# Patient Record
Sex: Female | Born: 1950
Health system: Southern US, Community
[De-identification: ages and names within clinical notes are randomized; demographics above are authoritative.]

## PROBLEM LIST (undated history)

## (undated) DIAGNOSIS — F32A Depression, unspecified: Secondary | ICD-10-CM

## (undated) DIAGNOSIS — F209 Schizophrenia, unspecified: Secondary | ICD-10-CM

## (undated) DIAGNOSIS — E785 Hyperlipidemia, unspecified: Secondary | ICD-10-CM

## (undated) DIAGNOSIS — K219 Gastro-esophageal reflux disease without esophagitis: Secondary | ICD-10-CM

## (undated) DIAGNOSIS — A539 Syphilis, unspecified: Secondary | ICD-10-CM

## (undated) DIAGNOSIS — F329 Major depressive disorder, single episode, unspecified: Secondary | ICD-10-CM

## (undated) DIAGNOSIS — I2510 Atherosclerotic heart disease of native coronary artery without angina pectoris: Secondary | ICD-10-CM

## (undated) DIAGNOSIS — I10 Essential (primary) hypertension: Secondary | ICD-10-CM

## (undated) DIAGNOSIS — E119 Type 2 diabetes mellitus without complications: Secondary | ICD-10-CM

## (undated) DIAGNOSIS — F0390 Unspecified dementia without behavioral disturbance: Secondary | ICD-10-CM

## (undated) HISTORY — PX: OTHER SURGICAL HISTORY: SHX169

## (undated) HISTORY — PX: TUBAL LIGATION: SHX77

## (undated) HISTORY — PX: COLONOSCOPY: SHX174

---

## 2005-08-20 ENCOUNTER — Emergency Department: Payer: Self-pay | Admitting: Emergency Medicine

## 2005-08-20 ENCOUNTER — Other Ambulatory Visit: Payer: Self-pay

## 2005-12-20 ENCOUNTER — Ambulatory Visit: Payer: Self-pay | Admitting: Family Medicine

## 2006-12-16 ENCOUNTER — Ambulatory Visit: Payer: Self-pay | Admitting: Gastroenterology

## 2007-03-24 ENCOUNTER — Ambulatory Visit: Payer: Self-pay | Admitting: Family Medicine

## 2007-03-25 ENCOUNTER — Ambulatory Visit: Payer: Self-pay | Admitting: Family Medicine

## 2007-04-12 ENCOUNTER — Ambulatory Visit: Payer: Self-pay | Admitting: Family Medicine

## 2007-05-12 ENCOUNTER — Ambulatory Visit: Payer: Self-pay | Admitting: Family Medicine

## 2007-06-12 ENCOUNTER — Ambulatory Visit: Payer: Self-pay | Admitting: Family Medicine

## 2007-12-30 ENCOUNTER — Ambulatory Visit: Payer: Self-pay | Admitting: Internal Medicine

## 2008-06-14 ENCOUNTER — Ambulatory Visit: Payer: Self-pay | Admitting: Family Medicine

## 2009-08-04 ENCOUNTER — Ambulatory Visit: Payer: Self-pay | Admitting: Family Medicine

## 2010-03-20 ENCOUNTER — Ambulatory Visit: Payer: Self-pay | Admitting: Gastroenterology

## 2011-02-19 ENCOUNTER — Ambulatory Visit: Payer: Self-pay | Admitting: Family Medicine

## 2013-11-17 DIAGNOSIS — F32A Depression, unspecified: Secondary | ICD-10-CM

## 2013-11-17 DIAGNOSIS — F329 Major depressive disorder, single episode, unspecified: Secondary | ICD-10-CM

## 2015-07-20 ENCOUNTER — Encounter: Payer: Self-pay | Admitting: *Deleted

## 2015-07-21 ENCOUNTER — Ambulatory Visit: Admission: RE | Admit: 2015-07-21 | Payer: Medicare Other | Source: Ambulatory Visit | Admitting: Gastroenterology

## 2015-07-21 ENCOUNTER — Encounter: Admission: RE | Payer: Self-pay | Source: Ambulatory Visit

## 2015-07-21 HISTORY — DX: Atherosclerotic heart disease of native coronary artery without angina pectoris: I25.10

## 2015-07-21 HISTORY — DX: Major depressive disorder, single episode, unspecified: F32.9

## 2015-07-21 HISTORY — DX: Syphilis, unspecified: A53.9

## 2015-07-21 HISTORY — DX: Gastro-esophageal reflux disease without esophagitis: K21.9

## 2015-07-21 HISTORY — DX: Depression, unspecified: F32.A

## 2015-07-21 HISTORY — DX: Essential (primary) hypertension: I10

## 2015-07-21 SURGERY — COLONOSCOPY WITH PROPOFOL
Anesthesia: General

## 2015-08-31 ENCOUNTER — Encounter: Payer: Self-pay | Admitting: *Deleted

## 2015-09-01 ENCOUNTER — Ambulatory Visit: Payer: Medicare Other | Admitting: Anesthesiology

## 2015-09-01 ENCOUNTER — Encounter: Payer: Self-pay | Admitting: *Deleted

## 2015-09-01 ENCOUNTER — Encounter
Admission: RE | Disposition: A | Discharge status: Home / Self Care | Payer: Self-pay | Source: Ambulatory Visit | Attending: Gastroenterology

## 2015-09-01 ENCOUNTER — Ambulatory Visit
Admission: RE | Admit: 2015-09-01 | Discharge: 2015-09-01 | Disposition: A | Discharge status: Home / Self Care | Payer: Medicare Other | Source: Ambulatory Visit | Attending: Gastroenterology | Admitting: Gastroenterology

## 2015-09-01 DIAGNOSIS — K219 Gastro-esophageal reflux disease without esophagitis: Secondary | ICD-10-CM | POA: Insufficient documentation

## 2015-09-01 DIAGNOSIS — K5730 Diverticulosis of large intestine without perforation or abscess without bleeding: Secondary | ICD-10-CM | POA: Insufficient documentation

## 2015-09-01 DIAGNOSIS — F17210 Nicotine dependence, cigarettes, uncomplicated: Secondary | ICD-10-CM | POA: Diagnosis not present

## 2015-09-01 DIAGNOSIS — Z1211 Encounter for screening for malignant neoplasm of colon: Secondary | ICD-10-CM | POA: Insufficient documentation

## 2015-09-01 DIAGNOSIS — J449 Chronic obstructive pulmonary disease, unspecified: Secondary | ICD-10-CM | POA: Diagnosis not present

## 2015-09-01 DIAGNOSIS — Z86010 Personal history of colonic polyps: Secondary | ICD-10-CM | POA: Diagnosis not present

## 2015-09-01 DIAGNOSIS — I10 Essential (primary) hypertension: Secondary | ICD-10-CM | POA: Diagnosis not present

## 2015-09-01 DIAGNOSIS — K562 Volvulus: Secondary | ICD-10-CM | POA: Diagnosis not present

## 2015-09-01 DIAGNOSIS — E119 Type 2 diabetes mellitus without complications: Secondary | ICD-10-CM | POA: Insufficient documentation

## 2015-09-01 DIAGNOSIS — Z7984 Long term (current) use of oral hypoglycemic drugs: Secondary | ICD-10-CM | POA: Insufficient documentation

## 2015-09-01 DIAGNOSIS — I2510 Atherosclerotic heart disease of native coronary artery without angina pectoris: Secondary | ICD-10-CM | POA: Insufficient documentation

## 2015-09-01 DIAGNOSIS — F329 Major depressive disorder, single episode, unspecified: Secondary | ICD-10-CM | POA: Insufficient documentation

## 2015-09-01 DIAGNOSIS — Z79899 Other long term (current) drug therapy: Secondary | ICD-10-CM | POA: Insufficient documentation

## 2015-09-01 HISTORY — DX: Type 2 diabetes mellitus without complications: E11.9

## 2015-09-01 HISTORY — DX: Hyperlipidemia, unspecified: E78.5

## 2015-09-01 HISTORY — PX: COLONOSCOPY WITH PROPOFOL: SHX5780

## 2015-09-01 LAB — GLUCOSE, CAPILLARY: GLUCOSE-CAPILLARY: 159 mg/dL — AB (ref 65–99)

## 2015-09-01 SURGERY — COLONOSCOPY WITH PROPOFOL
Anesthesia: General

## 2015-09-01 MED ORDER — SODIUM CHLORIDE 0.9 % IV SOLN: INTRAVENOUS | Status: DC

## 2015-09-01 MED ORDER — PROPOFOL 500 MG/50ML IV EMUL
INTRAVENOUS | Status: DC | PRN
Start: 2015-09-01 — End: 2015-09-01
  Administered 2015-09-01: 120 ug/kg/min via INTRAVENOUS

## 2015-09-01 MED ORDER — SODIUM CHLORIDE 0.9 % IV SOLN
INTRAVENOUS | Status: DC
  Administered 2015-09-01: 08:00:00 via INTRAVENOUS

## 2015-09-01 MED ORDER — PROPOFOL 10 MG/ML IV BOLUS
INTRAVENOUS | Status: DC | PRN
  Administered 2015-09-01: 80 mg via INTRAVENOUS

## 2015-09-01 NOTE — Anesthesia Postprocedure Evaluation (Signed)
Anesthesia Post Note  Patient: Erica Mueller  Procedure(s) Performed: Procedure(s) (LRB): COLONOSCOPY WITH PROPOFOL (N/A)  Patient location during evaluation: PACU Anesthesia Type: General Level of consciousness: awake and alert Pain management: pain level controlled Vital Signs Assessment: post-procedure vital signs reviewed and stable Respiratory status: spontaneous breathing and respiratory function stable Cardiovascular status: stable Anesthetic complications: no    Last Vitals:  Filed Vitals:   09/01/15 0933 09/01/15 0940  BP: 106/58 148/47  Pulse: 79   Temp: 36.1 C   Resp: 18     Last Pain: There were no vitals filed for this visit.               Yaroslav Gombos K

## 2015-09-01 NOTE — Op Note (Signed)
[facility] Gastroenterology Patient Name: Erica Mueller Procedure Date: 09/01/2015 8:37 AM MRN: [id number] Account #: [id number] Date of Birth: Jul 24, 1950 Admit Type: Outpatient Age: 65 Room: [facility] ENDO ROOM 4 Gender: Female Note Status: Finalized Procedure:            Colonoscopy Indications:          Personal history of colonic polyps Providers:            Ezzard Standing. Bluford Kaufmann, MD Referring MD:         Marina Goodell (Referring MD) Medicines:            Monitored Anesthesia Care Complications:        No immediate complications. Procedure:            Pre-Anesthesia Assessment:                       - Prior to the procedure, a History and Physical was                        performed, and patient medications, allergies and                        sensitivities were reviewed. The patient's tolerance of                        previous anesthesia was reviewed.                       - The risks and benefits of the procedure and the                        sedation options and risks were discussed with the                        patient. All questions were answered and informed                        consent was obtained.                       - After reviewing the risks and benefits, the patient                        was deemed in satisfactory condition to undergo the                        procedure.                       After obtaining informed consent, the colonoscope was                        passed under direct vision. Throughout the procedure,                        the patient's blood pressure, pulse, and oxygen                        saturations were monitored continuously. The  Colonoscope was introduced through the anus and                        advanced to the the cecum, identified by appendiceal                        orifice and ileocecal valve. The colonoscopy was                        performed with difficulty due to significant  looping.                        Successful completion of the procedure was aided by                        using manual pressure. The patient tolerated the                        procedure well. The quality of the bowel preparation                        was poor. Findings:      Multiple small and large-mouthed diverticula were found in the sigmoid       colon and descending colon.      The exam was otherwise without abnormality.      Due to poor prep, difficult to visualize left side of colon Impression:           - Preparation of the colon was poor.                       - Diverticulosis in the sigmoid colon and in the                        descending colon.                       - The examination was otherwise normal.                       - No specimens collected. Recommendation:       - Discharge patient to home.                       - Repeat colonoscopy in 5 years for surveillance.                       - The findings and recommendations were discussed with                        the patient.                       - Needs better prep next time. Procedure Code(s):    --- Professional ---                       [phone], Colonoscopy, flexible; diagnostic, including                        collection of specimen(s) by brushing or washing, when  performed (separate procedure) Diagnosis Code(s):    --- Professional ---                       Z86.010, Personal history of colonic polyps                       K57.30, Diverticulosis of large intestine without                        perforation or abscess without bleeding CPT copyright 2016 [employer]. All rights reserved. The codes documented in this report are preliminary and upon coder review may  be revised to meet current compliance requirements. Wallace Cullens, MD 09/01/2015 9:04:39 AM This report has been signed electronically. Number of Addenda: 0 Note Initiated On: 09/01/2015 8:37 AM Scope  Withdrawal Time: 0 hours 8 minutes 43 seconds  Total Procedure Duration: 0 hours 19 minutes 12 seconds       [facility]

## 2015-09-01 NOTE — Anesthesia Preprocedure Evaluation (Signed)
Anesthesia Evaluation  Patient identified by MRN, date of birth, ID band Patient awake    Reviewed: Allergy & Precautions, NPO status , Patient's Chart, lab work & pertinent test results  History of Anesthesia Complications Negative for: history of anesthetic complications  Airway Mallampati: III       Dental  (+) Upper Dentures, Lower Dentures   Pulmonary neg pulmonary ROS, COPD, Current Smoker,           Cardiovascular      Neuro/Psych Depression    GI/Hepatic Neg liver ROS, GERD  ,  Endo/Other  diabetes, Type 2, Oral Hypoglycemic Agents  Renal/GU negative Renal ROS     Musculoskeletal   Abdominal   Peds  Hematology negative hematology ROS (+)   Anesthesia Other Findings   Reproductive/Obstetrics                             Anesthesia Physical Anesthesia Plan  ASA: III  Anesthesia Plan: General   Post-op Pain Management:    Induction: Intravenous  Airway Management Planned: Nasal Cannula  Additional Equipment:   Intra-op Plan:   Post-operative Plan:   Informed Consent: I have reviewed the patients History and Physical, chart, labs and discussed the procedure including the risks, benefits and alternatives for the proposed anesthesia with the patient or authorized representative who has indicated his/her understanding and acceptance.     Plan Discussed with:   Anesthesia Plan Comments:         Anesthesia Quick Evaluation

## 2015-09-01 NOTE — H&P (Signed)
    Primary Care Physician:  [facility], MD Primary Gastroenterologist:  Dr. Bluford Kaufmann  Pre-Procedure History & Physical: HPI:  Erica Mueller is a 65 y.o. female is here for an colonoscopy.   Past Medical History  Diagnosis Date  . Depression   . GERD (gastroesophageal reflux disease)   . Syphilis (acquired)   . Hypertension   . Coronary artery disease   . Diabetes mellitus without complication (HCC)   . Elevated lipids     Past Surgical History  Procedure Laterality Date  . Tubal ligation    . Colonoscopy    . Wisdom teeth removal      Prior to Admission medications   Medication Sig Start Date End Date Taking? Authorizing Provider  amitriptyline (ELAVIL) 25 MG tablet Take 25 mg by mouth 2 (two) times daily.    Historical Provider, MD  lovastatin (MEVACOR) 40 MG tablet Take 40 mg by mouth at bedtime.    Historical Provider, MD  metFORMIN (GLUCOPHAGE) 500 MG tablet Take by mouth 2 (two) times daily with a meal.    Historical Provider, MD  pioglitazone (ACTOS) 15 MG tablet Take 15 mg by mouth daily.    Historical Provider, MD  sitaGLIPtin (JANUVIA) 100 MG tablet Take 100 mg by mouth daily.    Historical Provider, MD  thiothixene (NAVANE) 10 MG capsule Take 10 mg by mouth 3 (three) times daily.    Historical Provider, MD  venlafaxine XR (EFFEXOR-XR) 150 MG 24 hr capsule Take 150 mg by mouth daily with breakfast.    Historical Provider, MD    Allergies as of 08/19/2015  . (Not on File)    History reviewed. No pertinent family history.  Social History   Social History  . Marital Status: Single    Spouse Name: N/A  . Number of Children: N/A  . Years of Education: N/A   Occupational History  . Not on file.   Social History Main Topics  . Smoking status: Current Every Day Smoker  . Smokeless tobacco: Never Used  . Alcohol Use: No  . Drug Use: No  . Sexual Activity: Not on file   Other Topics Concern  . Not on file   Social History Narrative    Review of  Systems: See HPI, otherwise negative ROS  Physical Exam: BP 151/58 mmHg  Pulse 77  Temp(Src) 95.9 F (35.5 C) (Tympanic)  Resp 16  Ht 5\' 2"  (1.575 m)  Wt 73.483 kg (162 lb)  BMI 29.62 kg/m2  SpO2 100% General:   Alert,  pleasant and cooperative in NAD Head:  Normocephalic and atraumatic. Neck:  Supple; no masses or thyromegaly. Lungs:  Clear throughout to auscultation.    Heart:  Regular rate and rhythm. Abdomen:  Soft, nontender and nondistended. Normal bowel sounds, without guarding, and without rebound.   Neurologic:  Alert and  oriented x4;  grossly normal neurologically.  Impression/Plan: VERBA DEGEN is here for an colonoscpy to be performed for personal hx of colon polyps  Risks, benefits, limitations, and alternatives regarding colonoscopy have been reviewed with the patient.  Questions have been answered.  All parties agreeable.   Modesto Ganoe, Ezzard Standing, MD  09/01/2015, 8:37 AM

## 2015-09-01 NOTE — Transfer of Care (Signed)
Immediate Anesthesia Transfer of Care Note  Patient: Erica Mueller  Procedure(s) Performed: Procedure(s): COLONOSCOPY WITH PROPOFOL (N/A)  Patient Location: PACU and Endoscopy Unit  Anesthesia Type:General  Level of Consciousness: patient cooperative and lethargic  Airway & Oxygen Therapy: Patient Spontanous Breathing and Patient connected to nasal cannula oxygen  Post-op Assessment: Report given to RN and Post -op Vital signs reviewed and stable  Post vital signs: Reviewed and stable  Last Vitals:  Filed Vitals:   09/01/15 0800 09/01/15 0907  BP: 151/58 139/42  Pulse: 77 72  Temp: 35.5 C 36.4 C  Resp: 16     Complications: No apparent anesthesia complications

## 2015-09-01 NOTE — Transfer of Care (Signed)
Immediate Anesthesia Transfer of Care Note  Patient: Erica Mueller  Procedure(s) Performed: Procedure(s): COLONOSCOPY WITH PROPOFOL (N/A)  Patient Location: PACU and Short Stay  Anesthesia Type:General  Level of Consciousness: awake and patient cooperative  Airway & Oxygen Therapy: Patient Spontanous Breathing and Patient connected to nasal cannula oxygen  Post-op Assessment: Report given to RN and Post -op Vital signs reviewed and stable  Post vital signs: Reviewed and stable  Last Vitals:  Filed Vitals:   09/01/15 0907 09/01/15 0910  BP: 139/42 148/54  Pulse: 72   Temp: 36.4 C   Resp: 27     Complications: No apparent anesthesia complications

## 2016-06-20 ENCOUNTER — Encounter: Payer: Self-pay | Admitting: *Deleted

## 2016-06-20 ENCOUNTER — Emergency Department: Payer: Medicare Other

## 2016-06-20 ENCOUNTER — Emergency Department
Admission: EM | Admit: 2016-06-20 | Discharge: 2016-06-20 | Disposition: A | Discharge status: Home / Self Care | Payer: Medicare Other | Attending: Emergency Medicine | Admitting: Emergency Medicine

## 2016-06-20 DIAGNOSIS — E119 Type 2 diabetes mellitus without complications: Secondary | ICD-10-CM | POA: Insufficient documentation

## 2016-06-20 DIAGNOSIS — W19XXXA Unspecified fall, initial encounter: Secondary | ICD-10-CM

## 2016-06-20 DIAGNOSIS — F17210 Nicotine dependence, cigarettes, uncomplicated: Secondary | ICD-10-CM | POA: Diagnosis not present

## 2016-06-20 DIAGNOSIS — Y999 Unspecified external cause status: Secondary | ICD-10-CM | POA: Diagnosis not present

## 2016-06-20 DIAGNOSIS — I2510 Atherosclerotic heart disease of native coronary artery without angina pectoris: Secondary | ICD-10-CM | POA: Insufficient documentation

## 2016-06-20 DIAGNOSIS — Y9389 Activity, other specified: Secondary | ICD-10-CM | POA: Diagnosis not present

## 2016-06-20 DIAGNOSIS — W108XXA Fall (on) (from) other stairs and steps, initial encounter: Secondary | ICD-10-CM | POA: Diagnosis not present

## 2016-06-20 DIAGNOSIS — I10 Essential (primary) hypertension: Secondary | ICD-10-CM | POA: Insufficient documentation

## 2016-06-20 DIAGNOSIS — Y929 Unspecified place or not applicable: Secondary | ICD-10-CM | POA: Insufficient documentation

## 2016-06-20 DIAGNOSIS — S0990XA Unspecified injury of head, initial encounter: Secondary | ICD-10-CM | POA: Diagnosis present

## 2016-06-20 DIAGNOSIS — Z7984 Long term (current) use of oral hypoglycemic drugs: Secondary | ICD-10-CM | POA: Insufficient documentation

## 2016-06-20 NOTE — ED Provider Notes (Signed)
[facility] Emergency Department Provider Note  ____________________________________________   First MD Initiated Contact with Patient 06/20/16 [phone]     (approximate)  I have reviewed the triage vital signs and the nursing notes.   HISTORY  Chief Complaint Headache and Fall    HPI Erica Mueller is a 66 y.o. female with a PMH as listed below who presents for evaluation of headache after a recent fall.  She called out EMS earlier and then refused transport.  When she called them back, she said that her headache was better after ibuprofen but they convinced her to come in this time.    The patient had a mechanical fall down her outside steps about 3 days ago and struck her head on the concrete.  Unsure if she had LOC.  Since then she reports that her muscles are sore "all over" and that everything hurts, but she ambulates without difficulty.  She says that the pain in her head is moderate, generalized, but only occurs if she moves her head "in just the right way".  It is unclear if she is having neck pain; initially she told EMS she was, but she denied it to me.  Her affect/manner is somewhat scattered; EMS said they thought she has a history of schizophrenia, but I cannot find evidence of that diagnosis in the EMR.    She denies vision changes, chest pain, SOB, abd pain, N/V, dysuria.     Past Medical History:  Diagnosis Date  . Coronary artery disease   . Depression   . Diabetes mellitus without complication (HCC)   . Elevated lipids   . GERD (gastroesophageal reflux disease)   . Hypertension   . Syphilis (acquired)     There are no active problems to display for this patient.   Past Surgical History:  Procedure Laterality Date  . COLONOSCOPY    . COLONOSCOPY WITH PROPOFOL N/A 09/01/2015   Procedure: COLONOSCOPY WITH PROPOFOL;  Surgeon: Wallace Cullens, MD;  Location: [facility] ENDOSCOPY;  Service: Gastroenterology;  Laterality: N/A;  . TUBAL LIGATION      . wisdom teeth removal      Prior to Admission medications   Medication Sig Start Date End Date Taking? Authorizing Provider  amitriptyline (ELAVIL) 25 MG tablet Take 25 mg by mouth 2 (two) times daily.    Historical Provider, MD  lovastatin (MEVACOR) 40 MG tablet Take 40 mg by mouth at bedtime.    Historical Provider, MD  metFORMIN (GLUCOPHAGE) 500 MG tablet Take by mouth 2 (two) times daily with a meal.    Historical Provider, MD  pioglitazone (ACTOS) 15 MG tablet Take 15 mg by mouth daily.    Historical Provider, MD  sitaGLIPtin (JANUVIA) 100 MG tablet Take 100 mg by mouth daily.    Historical Provider, MD  thiothixene (NAVANE) 10 MG capsule Take 10 mg by mouth 3 (three) times daily.    Historical Provider, MD  venlafaxine XR (EFFEXOR-XR) 150 MG 24 hr capsule Take 150 mg by mouth daily with breakfast.    Historical Provider, MD    Allergies Patient has no known allergies.  History reviewed. No pertinent family history.  Social History Social History  Substance Use Topics  . Smoking status: Current Every Day Smoker    Types: Cigarettes  . Smokeless tobacco: Never Used  . Alcohol use Yes     Comment: occassionally    Review of Systems Constitutional: No fever/chills Eyes: No visual changes. ENT: No sore throat.  Cardiovascular: Denies chest pain. Respiratory: Denies shortness of breath. Gastrointestinal: No abdominal pain.  No nausea, no vomiting.  No diarrhea.  No constipation. Genitourinary: Negative for dysuria. Musculoskeletal: Pain "all over".  Ambulates without difficulty. Skin: Negative for rash. Neurological: Headache since mechanical fall several days ago.    10-point ROS otherwise negative.  ____________________________________________   PHYSICAL EXAM:  VITAL SIGNS: ED Triage Vitals  Enc Vitals Group     BP 06/20/16 0336 (!) 158/69     Pulse Rate 06/20/16 0336 88     Resp 06/20/16 0336 19     Temp 06/20/16 0336 98 F (36.7 C)     Temp Source  06/20/16 0336 Oral     SpO2 06/20/16 0325 98 %     Weight --      Height --      Head Circumference --      Peak Flow --      Pain Score 06/20/16 0337 8     Pain Loc --      Pain Edu? --      Excl. in GC? --     Constitutional: Alert and oriented. Well appearing and in no acute distress. Eyes: Conjunctivae are normal. PERRL. EOMI. Head: Atraumatic. Nose: No congestion/rhinnorhea. Mouth/Throat: Mucous membranes are moist.  Oropharynx non-erythematous. Neck: No stridor.  No meningeal signs.  No cervical spine tenderness to palpation. Cardiovascular: Normal rate, regular rhythm. Good peripheral circulation. Grossly normal heart sounds. Respiratory: Normal respiratory effort.  No retractions. Lungs CTAB. Gastrointestinal: Soft and nontender. No distention.  Musculoskeletal: No lower extremity tenderness nor edema. No gross deformities of extremities. Neurologic:  Normal speech and language. No gross focal neurologic deficits are appreciated.  Skin:  Skin is warm, dry and intact. No rash noted. Psychiatric: Mood and affect are odd but without specific concern/diagnosis  ____________________________________________   LABS (all labs ordered are listed, but only abnormal results are displayed)  Labs Reviewed - No data to display ____________________________________________  EKG  None - EKG not ordered by ED physician ____________________________________________  RADIOLOGY   Ct Head Wo Contrast  Result Date: 06/20/2016 CLINICAL DATA:  Fall down stairs EXAM: CT HEAD WITHOUT CONTRAST CT CERVICAL SPINE WITHOUT CONTRAST TECHNIQUE: Multidetector CT imaging of the head and cervical spine was performed following the standard protocol without intravenous contrast. Multiplanar CT image reconstructions of the cervical spine were also generated. COMPARISON:  None. FINDINGS: CT HEAD FINDINGS Brain: No mass lesion, intraparenchymal hemorrhage or extra-axial collection. No evidence of acute  cortical infarct. There is periventricular hypoattenuation compatible with chronic microvascular disease. Vascular: Atherosclerotic calcification of the internal carotid arteries at the skull base. Skull: Normal visualized skull base, calvarium and extracranial soft tissues. Sinuses/Orbits: No sinus fluid levels or advanced mucosal thickening. No mastoid effusion. Normal orbits. CT CERVICAL SPINE FINDINGS Alignment: No static subluxation. Facets are aligned. Occipital condyles are normally positioned. Skull base and vertebrae: No acute fracture. Soft tissues and spinal canal: No prevertebral fluid or swelling. No visible canal hematoma. Disc levels: Degenerative disc disease is worst at C5-C7. There is no bony spinal canal stenosis. No neural foraminal stenosis. Upper chest: No pneumothorax, pulmonary nodule or pleural effusion. Other: Normal visualized paraspinal cervical soft tissues. IMPRESSION: 1. No acute intracranial abnormality. 2. No acute fracture or static subluxation of the cervical spine. Electronically Signed   By: Deatra Robinson M.D.   On: 06/20/2016 04:32   Ct Cervical Spine Wo Contrast  Result Date: 06/20/2016 CLINICAL DATA:  Fall down stairs EXAM: CT HEAD  WITHOUT CONTRAST CT CERVICAL SPINE WITHOUT CONTRAST TECHNIQUE: Multidetector CT imaging of the head and cervical spine was performed following the standard protocol without intravenous contrast. Multiplanar CT image reconstructions of the cervical spine were also generated. COMPARISON:  None. FINDINGS: CT HEAD FINDINGS Brain: No mass lesion, intraparenchymal hemorrhage or extra-axial collection. No evidence of acute cortical infarct. There is periventricular hypoattenuation compatible with chronic microvascular disease. Vascular: Atherosclerotic calcification of the internal carotid arteries at the skull base. Skull: Normal visualized skull base, calvarium and extracranial soft tissues. Sinuses/Orbits: No sinus fluid levels or advanced mucosal  thickening. No mastoid effusion. Normal orbits. CT CERVICAL SPINE FINDINGS Alignment: No static subluxation. Facets are aligned. Occipital condyles are normally positioned. Skull base and vertebrae: No acute fracture. Soft tissues and spinal canal: No prevertebral fluid or swelling. No visible canal hematoma. Disc levels: Degenerative disc disease is worst at C5-C7. There is no bony spinal canal stenosis. No neural foraminal stenosis. Upper chest: No pneumothorax, pulmonary nodule or pleural effusion. Other: Normal visualized paraspinal cervical soft tissues. IMPRESSION: 1. No acute intracranial abnormality. 2. No acute fracture or static subluxation of the cervical spine. Electronically Signed   By: Deatra Robinson M.D.   On: 06/20/2016 04:32    ____________________________________________   PROCEDURES  Procedure(s) performed:   Procedures   Critical Care performed: No ____________________________________________   INITIAL IMPRESSION / ASSESSMENT AND PLAN / ED COURSE  Pertinent labs & imaging results that were available during my care of the patient were reviewed by me and considered in my medical decision making (see chart for details).  Patient is in no acute distress and has been ambulating without any difficulty for the last several days since her injury.  I reviewed the electronic medical record and see that she had a similar fall about 3 months ago and was seen by her primary care provider under almost exactly the same circumstances, fall down her stairs where she struck her head.  She has no  Evidence of traumatic injury on exam but given her somewhat unusual affect in her report of persistent headache I will obtain a CT scan of her head and her neck.   There is no indication for any imaging of her extremities at this time.  She has normal vitals without any other specific complaints, so lab work is not indicated at this time  either.  ____________________________________________  FINAL CLINICAL IMPRESSION(S) / ED DIAGNOSES  Final diagnoses:  Fall, initial encounter  Minor head injury, initial encounter     MEDICATIONS GIVEN DURING THIS VISIT:  Medications - No data to display   NEW OUTPATIENT MEDICATIONS STARTED DURING THIS VISIT:  New Prescriptions   No medications on file    Modified Medications   No medications on file    Discontinued Medications   No medications on file     Note:  This document was prepared using Dragon voice recognition software and may include unintentional dictation errors.    Loleta Rose, MD 06/20/16 [phone]
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
## 2016-06-20 NOTE — ED Notes (Signed)
Pt to ultrasound

## 2016-06-20 NOTE — ED Notes (Signed)
Dr. York Cerise at the bedside to discuss results and plan of care.

## 2016-06-20 NOTE — ED Triage Notes (Signed)
Pt states she fell backward down 8 steps, striking her head on concrete. Pt states she was carrying groceries and tripped. Pt c/o intermittent head pain since fall. Pt did not get medical treatment after fall until today. Pt states she has been taking advil, once on Mon and once Tues night. Pt states she came in because she started having sharp pain in her head and was told to come see the doctor.

## 2016-11-09 ENCOUNTER — Other Ambulatory Visit: Payer: Self-pay | Admitting: Nurse Practitioner

## 2016-11-09 DIAGNOSIS — Z1231 Encounter for screening mammogram for malignant neoplasm of breast: Secondary | ICD-10-CM

## 2016-11-14 ENCOUNTER — Ambulatory Visit: Admission: RE | Admit: 2016-11-14 | Payer: Medicare Other | Source: Ambulatory Visit

## 2017-04-01 ENCOUNTER — Emergency Department
Admission: EM | Admit: 2017-04-01 | Discharge: 2017-04-03 | Disposition: A | Discharge status: Other Acute Inpatient Hospital | Payer: Medicare Other | Attending: Emergency Medicine | Admitting: Emergency Medicine

## 2017-04-01 DIAGNOSIS — Z046 Encounter for general psychiatric examination, requested by authority: Secondary | ICD-10-CM | POA: Diagnosis not present

## 2017-04-01 DIAGNOSIS — F32A Depression, unspecified: Secondary | ICD-10-CM

## 2017-04-01 DIAGNOSIS — I2510 Atherosclerotic heart disease of native coronary artery without angina pectoris: Secondary | ICD-10-CM | POA: Insufficient documentation

## 2017-04-01 DIAGNOSIS — I10 Essential (primary) hypertension: Secondary | ICD-10-CM

## 2017-04-01 DIAGNOSIS — E119 Type 2 diabetes mellitus without complications: Secondary | ICD-10-CM | POA: Diagnosis not present

## 2017-04-01 DIAGNOSIS — Z7984 Long term (current) use of oral hypoglycemic drugs: Secondary | ICD-10-CM | POA: Insufficient documentation

## 2017-04-01 DIAGNOSIS — Z9119 Patient's noncompliance with other medical treatment and regimen: Secondary | ICD-10-CM

## 2017-04-01 DIAGNOSIS — Z79899 Other long term (current) drug therapy: Secondary | ICD-10-CM | POA: Diagnosis not present

## 2017-04-01 DIAGNOSIS — Z9114 Patient's other noncompliance with medication regimen: Secondary | ICD-10-CM | POA: Insufficient documentation

## 2017-04-01 DIAGNOSIS — E1169 Type 2 diabetes mellitus with other specified complication: Secondary | ICD-10-CM

## 2017-04-01 DIAGNOSIS — E11649 Type 2 diabetes mellitus with hypoglycemia without coma: Secondary | ICD-10-CM

## 2017-04-01 DIAGNOSIS — F69 Unspecified disorder of adult personality and behavior: Secondary | ICD-10-CM | POA: Diagnosis not present

## 2017-04-01 DIAGNOSIS — F17210 Nicotine dependence, cigarettes, uncomplicated: Secondary | ICD-10-CM | POA: Insufficient documentation

## 2017-04-01 DIAGNOSIS — R456 Violent behavior: Secondary | ICD-10-CM | POA: Diagnosis present

## 2017-04-01 DIAGNOSIS — F251 Schizoaffective disorder, depressive type: Secondary | ICD-10-CM | POA: Diagnosis not present

## 2017-04-01 DIAGNOSIS — E1165 Type 2 diabetes mellitus with hyperglycemia: Secondary | ICD-10-CM

## 2017-04-01 DIAGNOSIS — F329 Major depressive disorder, single episode, unspecified: Secondary | ICD-10-CM

## 2017-04-01 DIAGNOSIS — Z91199 Patient's noncompliance with other medical treatment and regimen due to unspecified reason: Secondary | ICD-10-CM

## 2017-04-01 LAB — COMPREHENSIVE METABOLIC PANEL
ALBUMIN: 4.5 g/dL (ref 3.5–5.0)
ALK PHOS: 48 U/L (ref 38–126)
ALT: 14 U/L (ref 14–54)
AST: 19 U/L (ref 15–41)
Anion gap: 10 (ref 5–15)
BUN: 7 mg/dL (ref 6–20)
CALCIUM: 9.7 mg/dL (ref 8.9–10.3)
CHLORIDE: 105 mmol/L (ref 101–111)
CO2: 26 mmol/L (ref 22–32)
CREATININE: 0.82 mg/dL (ref 0.44–1.00)
GFR calc non Af Amer: >60 mL/min (ref >60)
GLUCOSE: 105 mg/dL — AB (ref 65–99)
Potassium: 3.7 mmol/L (ref 3.5–5.1)
SODIUM: 141 mmol/L (ref 135–145)
Total Bilirubin: 0.6 mg/dL (ref 0.3–1.2)
Total Protein: 8.8 g/dL — ABNORMAL HIGH (ref 6.5–8.1)

## 2017-04-01 LAB — CBC
HEMATOCRIT: 40.1 % (ref 35.0–47.0)
HEMOGLOBIN: 13.2 g/dL (ref 12.0–16.0)
MCH: 28.8 pg (ref 26.0–34.0)
MCHC: 32.8 g/dL (ref 32.0–36.0)
MCV: 87.8 fL (ref 80.0–100.0)
Platelets: 236 10*3/uL (ref 150–440)
RBC: 4.57 MIL/uL (ref 3.80–5.20)
RDW: 14 % (ref 11.5–14.5)
WBC: 5.9 10*3/uL (ref 3.6–11.0)

## 2017-04-01 LAB — ACETAMINOPHEN LEVEL: Acetaminophen (Tylenol), Serum: <10 ug/mL — ABNORMAL LOW (ref 10–30)

## 2017-04-01 LAB — SALICYLATE LEVEL

## 2017-04-01 LAB — URINE DRUG SCREEN, QUALITATIVE (ARMC ONLY)
Amphetamines, Ur Screen: NOT DETECTED
Barbiturates, Ur Screen: NOT DETECTED
Benzodiazepine, Ur Scrn: NOT DETECTED
CANNABINOID 50 NG, UR ~~LOC~~: NOT DETECTED
COCAINE METABOLITE, UR ~~LOC~~: NOT DETECTED
MDMA (ECSTASY) UR SCREEN: NOT DETECTED
METHADONE SCREEN, URINE: NOT DETECTED
OPIATE, UR SCREEN: NOT DETECTED
Phencyclidine (PCP) Ur S: NOT DETECTED
TRICYCLIC, UR SCREEN: POSITIVE — AB

## 2017-04-01 LAB — ETHANOL: Alcohol, Ethyl (B): <10 mg/dL (ref <10)

## 2017-04-01 MED ORDER — LINAGLIPTIN 5 MG PO TABS
5.0000 mg | ORAL_TABLET | Freq: Every day | ORAL | Status: DC
Start: 2017-04-01 — End: 2017-04-03
  Administered 2017-04-01 – 2017-04-03 (×3): 5 mg via ORAL
  Filled 2017-04-01 (×3): qty 1

## 2017-04-01 MED ORDER — VENLAFAXINE HCL ER 75 MG PO CP24
150.0000 mg | ORAL_CAPSULE | Freq: Every day | ORAL | Status: DC
  Administered 2017-04-02 – 2017-04-03 (×2): 150 mg via ORAL
  Filled 2017-04-01: qty 2
  Filled 2017-04-01: qty 1

## 2017-04-01 MED ORDER — PRAVASTATIN SODIUM 40 MG PO TABS
40.0000 mg | ORAL_TABLET | Freq: Every day | ORAL | Status: DC
  Filled 2017-04-01: qty 1

## 2017-04-01 MED ORDER — THIOTHIXENE 10 MG PO CAPS
10.0000 mg | ORAL_CAPSULE | Freq: Three times a day (TID) | ORAL | Status: DC
  Administered 2017-04-01 – 2017-04-03 (×4): 10 mg via ORAL
  Filled 2017-04-01 (×6): qty 1

## 2017-04-01 MED ORDER — METFORMIN HCL 500 MG PO TABS
500.0000 mg | ORAL_TABLET | Freq: Two times a day (BID) | ORAL | Status: DC
  Administered 2017-04-02 – 2017-04-03 (×3): 500 mg via ORAL
  Filled 2017-04-01 (×3): qty 1

## 2017-04-01 MED ORDER — PIOGLITAZONE HCL 15 MG PO TABS
15.0000 mg | ORAL_TABLET | Freq: Every day | ORAL | Status: DC
  Administered 2017-04-01 – 2017-04-03 (×3): 15 mg via ORAL
  Filled 2017-04-01 (×3): qty 1

## 2017-04-01 MED ORDER — AMITRIPTYLINE HCL 25 MG PO TABS
25.0000 mg | ORAL_TABLET | Freq: Two times a day (BID) | ORAL | Status: DC
  Administered 2017-04-01 – 2017-04-03 (×4): 25 mg via ORAL
  Filled 2017-04-01 (×4): qty 1

## 2017-04-01 NOTE — ED Notes (Signed)
PT  IVC  FROM  [facility]  PENDING  PLACEMENT
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
## 2017-04-01 NOTE — ED Provider Notes (Signed)
[facility] Emergency Department Provider Note ____________________________________________   First MD Initiated Contact with Patient 04/01/17 2006     (approximate)  I have reviewed the triage vital signs and the nursing notes.   HISTORY  Chief Complaint IVC    HPI Erica Mueller is a 66 y.o. female with past medical history as noted below who presents for psychiatric evaluation with threatening behavior towards family members.    Onset: acute.  Course: resolved.  Associated symptoms: none.  Precipitating factors: medication noncompliance.   Patient denies any acute medical complaints.  She is IVC.   Past Medical History:  Diagnosis Date  . Coronary artery disease   . Depression   . Diabetes mellitus without complication (HCC)   . Elevated lipids   . GERD (gastroesophageal reflux disease)   . Hypertension   . Syphilis (acquired)     There are no active problems to display for this patient.   Past Surgical History:  Procedure Laterality Date  . COLONOSCOPY    . COLONOSCOPY WITH PROPOFOL N/A 09/01/2015   Procedure: COLONOSCOPY WITH PROPOFOL;  Surgeon: Wallace CullensPaul Y Oh, MD;  [facility];  Service: Gastroenterology;  Laterality: N/A;  . TUBAL LIGATION    . wisdom teeth removal      Prior to Admission medications   Medication Sig Start Date End Date Taking? Authorizing Provider  amitriptyline (ELAVIL) 25 MG tablet Take 25 mg by mouth 2 (two) times daily.    [provider]  lovastatin (MEVACOR) 40 MG tablet Take 40 mg by mouth at bedtime.    [provider]  metFORMIN (GLUCOPHAGE) 500 MG tablet Take by mouth 2 (two) times daily with a meal.    [provider]  pioglitazone (ACTOS) 15 MG tablet Take 15 mg by mouth daily.    [provider]  sitaGLIPtin (JANUVIA) 100 MG tablet Take 100 mg by mouth daily.    [provider]  thiothixene (NAVANE) 10 MG capsule Take 10 mg by mouth 3 (three) times  daily.    [provider]  venlafaxine XR (EFFEXOR-XR) 150 MG 24 hr capsule Take 150 mg by mouth daily with breakfast.    [provider]    Allergies Patient has no known allergies.  No family history on file.  Social History Social History  Substance Use Topics  . Smoking status: Current Every Day Smoker    Types: Cigarettes  . Smokeless tobacco: Never Used  . Alcohol use Yes     Comment: occassionally    Review of Systems  Constitutional: No fever. Eyes: No visual changes. ENT: No neck pain. Cardiovascular: Denies chest pain. Respiratory: Denies shortness of breath. Gastrointestinal: No nausea, no vomiting.   Genitourinary: Negative for flank pain.  Musculoskeletal: Negative for back pain. Skin: Negative for rash. Neurological: Negative for headache.   ____________________________________________   PHYSICAL EXAM:  VITAL SIGNS: ED Triage Vitals [04/01/17 1837]  Enc Vitals Group     BP (!) 163/83     Pulse Rate 98     Resp 18     Temp 98.3 F (36.8 C)     Temp Source Oral     SpO2 98 %     Weight 160 lb (72.6 kg)     Height      Head Circumference      Peak Flow      Pain Score      Pain Loc      Pain Edu?  Excl. in GC?     Constitutional: Alert and oriented. Well appearing and in no acute distress. Eyes: Conjunctivae are normal.  Head: Atraumatic. Nose: No congestion/rhinnorhea. Mouth/Throat: Mucous membranes are moist.   Neck: Normal range of motion.  Cardiovascular:  Good peripheral circulation. Respiratory: Normal respiratory effort.   Gastrointestinal:No distention.  Genitourinary: No CVA tenderness. Musculoskeletal: Extremities warm and well perfused.  Neurologic:  Normal speech and language. No gross focal neurologic deficits are appreciated.  Skin:  Skin is warm and dry. No rash noted. Psychiatric: Mood and affect are normal. Speech and behavior are normal.  ____________________________________________    LABS (all labs ordered are listed, but only abnormal results are displayed)  Labs Reviewed  COMPREHENSIVE METABOLIC PANEL - Abnormal; Notable for the following:       Result Value   Glucose, Bld 105 (*)    Total Protein 8.8 (*)    All other components within normal limits  ACETAMINOPHEN LEVEL - Abnormal; Notable for the following:    Acetaminophen (Tylenol), Serum <10 (*)    All other components within normal limits  URINE DRUG SCREEN, QUALITATIVE ([facility] ONLY) - Abnormal; Notable for the following:    Tricyclic, Ur Screen POSITIVE (*)    All other components within normal limits  ETHANOL  SALICYLATE LEVEL  CBC   ____________________________________________  EKG   ____________________________________________  RADIOLOGY    ____________________________________________   PROCEDURES  Procedure(s) performed: No    Critical Care performed: No ____________________________________________   INITIAL IMPRESSION / ASSESSMENT AND PLAN / ED COURSE  Pertinent labs & imaging results that were available during my care of the patient were reviewed by me and considered in my medical decision making (see chart for details).  Patient presents involuntarily committed for psych evaluation and medical clearance with threatening behavior. Patient denies any acute medical complaints. Vital signs are normal, and exam is unremarkable. Pending SOC evaluation.  Clinical Course as of Apr 01 2330  Mon Apr 01, 2017  2327 Signed out to Dr. Zenda Alpers.  Medically clear; pending admission to psych.    [SS]    Clinical Course User Index [SS] Dionne Bucy, MD     ____________________________________________   FINAL CLINICAL IMPRESSION(S) / ED DIAGNOSES  Final diagnoses:  Behavior concern in adult      NEW MEDICATIONS STARTED DURING THIS VISIT:  New Prescriptions   No medications on file     Note:  This document was prepared using Dragon voice recognition software and may  include unintentional dictation errors.    Dionne Bucy, MD 04/01/17 [phone]

## 2017-04-01 NOTE — ED Notes (Signed)
pts brothers, Susa Simmonds [phone] and Georgiann Cocker [phone].  Call whenptisdischarged and either one will pick pt up
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
## 2017-04-01 NOTE — ED Notes (Signed)
Pt ambulated to bathroom without assistance 

## 2017-04-02 DIAGNOSIS — E1165 Type 2 diabetes mellitus with hyperglycemia: Secondary | ICD-10-CM

## 2017-04-02 DIAGNOSIS — I10 Essential (primary) hypertension: Secondary | ICD-10-CM

## 2017-04-02 DIAGNOSIS — Z91199 Patient's noncompliance with other medical treatment and regimen due to unspecified reason: Secondary | ICD-10-CM

## 2017-04-02 DIAGNOSIS — E119 Type 2 diabetes mellitus without complications: Secondary | ICD-10-CM

## 2017-04-02 DIAGNOSIS — E11649 Type 2 diabetes mellitus with hypoglycemia without coma: Secondary | ICD-10-CM

## 2017-04-02 DIAGNOSIS — F251 Schizoaffective disorder, depressive type: Secondary | ICD-10-CM

## 2017-04-02 DIAGNOSIS — Z9119 Patient's noncompliance with other medical treatment and regimen: Secondary | ICD-10-CM

## 2017-04-02 DIAGNOSIS — E1169 Type 2 diabetes mellitus with other specified complication: Secondary | ICD-10-CM

## 2017-04-02 NOTE — ED Notes (Signed)
Pt with a phone call  [phone] phone used   

## 2017-04-02 NOTE — ED Notes (Signed)

## 2017-04-02 NOTE — ED Notes (Signed)
Pt to transfer to [employer] room 2  Escorted by [employer] and [employer] officer  Pt is IVC pending psych consult

## 2017-04-02 NOTE — ED Notes (Signed)
Levander Campion from [facility] here to see patient. [facility] RN notified.  Patient in shower - will wait in WR.

## 2017-04-02 NOTE — ED Notes (Signed)
Pt with a phone call  [phone] phone used

## 2017-04-02 NOTE — BH Assessment (Signed)
Referral for Geriatric placement submitted to the following:   [facility] (p-[phone]/f-[phone])   [facility] ([phone]/[phone])   [facility] (p-[phone]/f-[phone])   [facility] (p-[phone]/f-[phone])   [facility] (f-[phone])   [facility] ([phone]/f-[phone])   [facility] (p-[phone]/f-[phone]) [facility] [phone] 

## 2017-04-02 NOTE — ED Notes (Signed)
[facility] staff in to consult with her - HIPPA privacy maintained
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
## 2017-04-02 NOTE — ED Notes (Signed)
Pt arrived on unit via tech. Pt denies pain/ah/vh/si/and hi at this time. Pt verbalized that "her granddaughter needs to get her own place and leave her along". Pt is calm and cooperative upon approach. Pt continues to show signs of tardive dyskinesia by the protruding of her tongue. Pt was offer a blanket, food,and beverage. Will cont to monitor pt.

## 2017-04-02 NOTE — ED Notes (Signed)
She is to transfer to [facility]   Her brother is here for a visit then she will transfer for pending psych consult

## 2017-04-02 NOTE — ED Notes (Signed)
She now has a visit from her sister - visits allowed so that BHU will not have to   Pt observed with no unusual behavior  Appropriate to stimulation  No verbalized needs or concerns at this time  NAD assessed  Continue to monitor

## 2017-04-02 NOTE — ED Notes (Signed)
BEHAVIORAL HEALTH ROUNDING Patient sleeping: No. Patient alert and oriented: yes Behavior appropriate: Yes.  ; If no, describe:  Nutrition and fluids offered: yes Toileting and hygiene offered: Yes  Sitter present: q15 minute observations and security  monitoring Law enforcement present: Yes  ODS  

## 2017-04-02 NOTE — ED Notes (Signed)
ED Is the patient under IVC or is there intent for IVC: Yes.   Is the patient medically cleared: Yes.   Is there vacancy in the ED BHU: Yes.   Is the population mix appropriate for patient: Yes.   Is the patient awaiting placement in inpatient or outpatient setting:   Has the patient had a psychiatric consult:  Consult pending Survey of unit performed for contraband, proper placement and condition of furniture, tampering with fixtures in bathroom, shower, and each patient room: Yes.  ; Findings:  APPEARANCE/BEHAVIOR Calm and cooperative NEURO ASSESSMENT Orientation: oriented x4 Denies pain Hallucinations: No.None noted (Hallucinations) Speech: Normal Gait: normal RESPIRATORY ASSESSMENT Even  Unlabored respirations  CARDIOVASCULAR ASSESSMENT Pulses equal   regular rate  Skin warm and dry   GASTROINTESTINAL ASSESSMENT no GI complaint EXTREMITIES Full ROM  PLAN OF CARE Provide calm/safe environment. Vital signs assessed twice daily. ED BHU Assessment once each 12-hour shift. Collaborate with TTS when available or as condition indicates. Assure the ED provider has rounded once each shift. Provide and encourage hygiene. Provide redirection as needed. Assess for escalating behavior; address immediately and inform ED provider.  Assess family dynamic and appropriateness for visitation as needed: Yes.  ; If necessary, describe findings:  Educate the patient/family about BHU procedures/visitation: Yes.  ; If necessary, describe findings:   

## 2017-04-02 NOTE — ED Notes (Signed)
Received report from Amy T. RN

## 2017-04-02 NOTE — ED Notes (Signed)

## 2017-04-02 NOTE — ED Notes (Signed)
BEHAVIORAL HEALTH ROUNDING Patient sleeping: Yes.   Patient alert and oriented: eyes closed  Appears to be asleep Behavior appropriate: Yes.  ; If no, describe:  Nutrition and fluids offered: Yes  Toileting and hygiene offered: sleeping Sitter present: q 15 minute observations and security monitoring Law enforcement present: yes  ODS 

## 2017-04-02 NOTE — ED Notes (Signed)
Patient observed lying in bed with eyes closed  Even, unlabored respirations observed   NAD pt appears to be sleeping  I will continue to monitor along with every 15 minute visual observations and ongoing security monitoring    

## 2017-04-02 NOTE — ED Notes (Signed)
PT IVC/ PENDING PLACEMENT  

## 2017-04-02 NOTE — ED Provider Notes (Signed)
-----------------------------------------   7:25 AM on 04/02/2017 -----------------------------------------   Blood pressure (!) 163/83, pulse 98, temperature 98.3 F (36.8 C), temperature source Oral, resp. rate 18, weight 72.6 kg (160 lb), SpO2 98 %.  The patient had no acute events since last update.  Calm and cooperative at this time.  Disposition is pending Psychiatry/Behavioral Medicine team recommendations.     Rebecka Apley, MD 04/02/17 [phone]

## 2017-04-02 NOTE — ED Notes (Signed)
Pt. Alert and oriented, warm and dry, in no distress. Pt. Denies SI, HI, and VH. Pt states hearing voices, but they are not saying anything that she can make sense. Pt. Encouraged to let nursing staff know of any concerns or needs.

## 2017-04-03 NOTE — ED Notes (Signed)
Breakfast served, Patient did consume 100%.

## 2017-04-03 NOTE — ED Notes (Signed)
Patient with discharge instructions, Patient voices understanding of discharge instructions, no signs of distress or behavioral issues, all clothing given back to patient and she dressed, left in police custody,

## 2017-04-03 NOTE — ED Notes (Addendum)
Called report to [employer]. Reported to [employer]. PATIENT IS  Calm and cooperative, no behavioral issues noted.

## 2017-04-03 NOTE — BH Assessment (Signed)
Patient has been accepted at [facility] (per Cristie Hem [phone]). Accepting MD:  Dr. Avanell Shackleton Call report: [phone] (ask for unit 900 nurse) Patient can report anytime, pending transportation through the [facility] Dep to:  [facility] [address] [place], [place]  [address]  ED staff notified.

## 2017-05-26 ENCOUNTER — Encounter: Payer: Self-pay | Admitting: Emergency Medicine

## 2017-05-26 ENCOUNTER — Emergency Department
Admission: EM | Admit: 2017-05-26 | Discharge: 2017-05-26 | Disposition: A | Discharge status: Other Acute Inpatient Hospital | Payer: Medicare Other | Attending: Emergency Medicine | Admitting: Emergency Medicine

## 2017-05-26 DIAGNOSIS — Z9119 Patient's noncompliance with other medical treatment and regimen: Secondary | ICD-10-CM

## 2017-05-26 DIAGNOSIS — F251 Schizoaffective disorder, depressive type: Secondary | ICD-10-CM | POA: Diagnosis not present

## 2017-05-26 DIAGNOSIS — R45850 Homicidal ideations: Secondary | ICD-10-CM | POA: Insufficient documentation

## 2017-05-26 DIAGNOSIS — E11649 Type 2 diabetes mellitus with hypoglycemia without coma: Secondary | ICD-10-CM

## 2017-05-26 DIAGNOSIS — I10 Essential (primary) hypertension: Secondary | ICD-10-CM | POA: Diagnosis present

## 2017-05-26 DIAGNOSIS — F329 Major depressive disorder, single episode, unspecified: Secondary | ICD-10-CM | POA: Diagnosis present

## 2017-05-26 DIAGNOSIS — E1169 Type 2 diabetes mellitus with other specified complication: Secondary | ICD-10-CM

## 2017-05-26 DIAGNOSIS — I2510 Atherosclerotic heart disease of native coronary artery without angina pectoris: Secondary | ICD-10-CM | POA: Insufficient documentation

## 2017-05-26 DIAGNOSIS — Z91199 Patient's noncompliance with other medical treatment and regimen due to unspecified reason: Secondary | ICD-10-CM

## 2017-05-26 DIAGNOSIS — F17210 Nicotine dependence, cigarettes, uncomplicated: Secondary | ICD-10-CM | POA: Insufficient documentation

## 2017-05-26 DIAGNOSIS — E1165 Type 2 diabetes mellitus with hyperglycemia: Secondary | ICD-10-CM

## 2017-05-26 DIAGNOSIS — E119 Type 2 diabetes mellitus without complications: Secondary | ICD-10-CM | POA: Insufficient documentation

## 2017-05-26 DIAGNOSIS — F323 Major depressive disorder, single episode, severe with psychotic features: Secondary | ICD-10-CM | POA: Insufficient documentation

## 2017-05-26 DIAGNOSIS — F32A Depression, unspecified: Secondary | ICD-10-CM | POA: Diagnosis present

## 2017-05-26 DIAGNOSIS — Z7984 Long term (current) use of oral hypoglycemic drugs: Secondary | ICD-10-CM | POA: Insufficient documentation

## 2017-05-26 LAB — BASIC METABOLIC PANEL
ANION GAP: 10 (ref 5–15)
BUN: 12 mg/dL (ref 6–20)
CALCIUM: 9.7 mg/dL (ref 8.9–10.3)
CO2: 26 mmol/L (ref 22–32)
CREATININE: 0.75 mg/dL (ref 0.44–1.00)
Chloride: 102 mmol/L (ref 101–111)
Glucose, Bld: 122 mg/dL — ABNORMAL HIGH (ref 65–99)
Potassium: 3.2 mmol/L — ABNORMAL LOW (ref 3.5–5.1)
SODIUM: 138 mmol/L (ref 135–145)

## 2017-05-26 LAB — CBC
HCT: 40.9 % (ref 35.0–47.0)
Hemoglobin: 13.4 g/dL (ref 12.0–16.0)
MCH: 29.9 pg (ref 26.0–34.0)
MCHC: 32.8 g/dL (ref 32.0–36.0)
MCV: 90.9 fL (ref 80.0–100.0)
PLATELETS: 221 10*3/uL (ref 150–440)
RBC: 4.5 MIL/uL (ref 3.80–5.20)
RDW: 13.9 % (ref 11.5–14.5)
WBC: 6.6 10*3/uL (ref 3.6–11.0)

## 2017-05-26 LAB — URINE DRUG SCREEN, QUALITATIVE (ARMC ONLY)
Amphetamines, Ur Screen: NOT DETECTED
BARBITURATES, UR SCREEN: NOT DETECTED
BENZODIAZEPINE, UR SCRN: NOT DETECTED
CANNABINOID 50 NG, UR ~~LOC~~: NOT DETECTED
Cocaine Metabolite,Ur ~~LOC~~: NOT DETECTED
MDMA (Ecstasy)Ur Screen: NOT DETECTED
Methadone Scn, Ur: NOT DETECTED
OPIATE, UR SCREEN: NOT DETECTED
PHENCYCLIDINE (PCP) UR S: NOT DETECTED
Tricyclic, Ur Screen: POSITIVE — AB

## 2017-05-26 LAB — URINALYSIS, COMPLETE (UACMP) WITH MICROSCOPIC
BILIRUBIN URINE: NEGATIVE
Bacteria, UA: NONE SEEN
GLUCOSE, UA: NEGATIVE mg/dL
Hgb urine dipstick: NEGATIVE
KETONES UR: NEGATIVE mg/dL
Nitrite: NEGATIVE
PH: 5 (ref 5.0–8.0)
PROTEIN: NEGATIVE mg/dL
Specific Gravity, Urine: 1.014 (ref 1.005–1.030)

## 2017-05-26 LAB — GLUCOSE, CAPILLARY
GLUCOSE-CAPILLARY: 120 mg/dL — AB (ref 65–99)
GLUCOSE-CAPILLARY: 136 mg/dL — AB (ref 65–99)

## 2017-05-26 LAB — ETHANOL

## 2017-05-26 MED ORDER — CIPROFLOXACIN HCL 500 MG PO TABS
500.0000 mg | ORAL_TABLET | Freq: Two times a day (BID) | ORAL | Status: DC
  Administered 2017-05-26: 500 mg via ORAL
  Filled 2017-05-26: qty 1

## 2017-05-26 MED ORDER — PRAVASTATIN SODIUM 20 MG PO TABS
20.0000 mg | ORAL_TABLET | Freq: Every day | ORAL | Status: DC
  Administered 2017-05-26: 20 mg via ORAL
  Filled 2017-05-26: qty 1

## 2017-05-26 MED ORDER — AMITRIPTYLINE HCL 25 MG PO TABS
25.0000 mg | ORAL_TABLET | Freq: Every day | ORAL | Status: DC
  Administered 2017-05-26: 25 mg via ORAL
  Filled 2017-05-26: qty 1

## 2017-05-26 MED ORDER — THIOTHIXENE 5 MG PO CAPS
5.0000 mg | ORAL_CAPSULE | Freq: Two times a day (BID) | ORAL | Status: DC
  Administered 2017-05-26: 5 mg via ORAL
  Filled 2017-05-26 (×2): qty 1

## 2017-05-26 MED ORDER — POTASSIUM CHLORIDE CRYS ER 20 MEQ PO TBCR
30.0000 meq | EXTENDED_RELEASE_TABLET | Freq: Once | ORAL | Status: AC
  Administered 2017-05-26: 30 meq via ORAL
  Filled 2017-05-26: qty 2

## 2017-05-26 MED ORDER — METFORMIN HCL 500 MG PO TABS
500.0000 mg | ORAL_TABLET | Freq: Two times a day (BID) | ORAL | Status: DC
  Administered 2017-05-26: 500 mg via ORAL
  Filled 2017-05-26: qty 1

## 2017-05-26 MED ORDER — BENZTROPINE MESYLATE 1 MG PO TABS
0.5000 mg | ORAL_TABLET | Freq: Two times a day (BID) | ORAL | Status: DC
  Administered 2017-05-26: 0.5 mg via ORAL
  Filled 2017-05-26: qty 1

## 2017-05-26 MED ORDER — LINAGLIPTIN 5 MG PO TABS
5.0000 mg | ORAL_TABLET | Freq: Every day | ORAL | Status: DC
  Administered 2017-05-26: 5 mg via ORAL
  Filled 2017-05-26: qty 1

## 2017-05-26 MED ORDER — PIOGLITAZONE HCL 15 MG PO TABS
15.0000 mg | ORAL_TABLET | Freq: Every day | ORAL | Status: DC
  Administered 2017-05-26: 15 mg via ORAL
  Filled 2017-05-26: qty 1

## 2017-05-26 NOTE — ED Notes (Signed)
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
## 2017-05-26 NOTE — ED Notes (Signed)

## 2017-05-26 NOTE — ED Notes (Signed)
Patient belongings - 1 pair jeans, green colored t-shirt, tan colored sweater, black and white colored sweater, tennis shoes, green colored sweater, 2 20 oz diet mt dews, 1 apple pie, address/date book with black colored cover, brown colored purse, 3 cans of 25 oz Budweisers, 1 black colored ZTE cell phone. 1 valuables envelope with wallet, credit cards and currency sent to hospital safe via security, 1 bag with patients medications sent to pharmacy.
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
## 2017-05-26 NOTE — ED Notes (Signed)
Key #[id number] for locked valuables in pyxis.

## 2017-05-26 NOTE — ED Notes (Signed)
Patient attempted to void but was unable to at this time

## 2017-05-26 NOTE — ED Notes (Signed)
Pt orient to unit and given rules. Pt verbalized understanding. Pt verbalized she is feeling much better since being in the hospital. Remains on Q 15 mins safety checks. Will cont to monitor pt.

## 2017-05-26 NOTE — ED Notes (Signed)
Patient received PM snack. 
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
## 2017-05-26 NOTE — ED Notes (Signed)
Report was received from Osie B., RN; Pt. Verbalizes complaints of being angry with family members; not taking meds distress; denies S.I.; verbalizes Hi.toward family members; Continue to monitor with 15 min. Monitoring.

## 2017-05-26 NOTE — ED Notes (Signed)
Pt alert and oriented. NAD. Up to bathroom. report to amy RN in [place]

## 2017-05-27 ENCOUNTER — Encounter: Payer: Self-pay | Admitting: Psychiatry

## 2017-05-27 ENCOUNTER — Other Ambulatory Visit: Payer: Self-pay

## 2017-05-27 ENCOUNTER — Inpatient Hospital Stay
Admission: AD | Admit: 2017-05-27 | Discharge: 2017-05-30 | DRG: 885 | Disposition: A | Discharge status: Home / Self Care | Payer: Medicare Other | Attending: Psychiatry | Admitting: Psychiatry

## 2017-05-27 DIAGNOSIS — R45850 Homicidal ideations: Secondary | ICD-10-CM | POA: Diagnosis present

## 2017-05-27 DIAGNOSIS — F209 Schizophrenia, unspecified: Secondary | ICD-10-CM | POA: Diagnosis not present

## 2017-05-27 DIAGNOSIS — N390 Urinary tract infection, site not specified: Secondary | ICD-10-CM | POA: Diagnosis present

## 2017-05-27 DIAGNOSIS — Z6379 Other stressful life events affecting family and household: Secondary | ICD-10-CM | POA: Diagnosis not present

## 2017-05-27 DIAGNOSIS — Z9114 Patient's other noncompliance with medication regimen: Secondary | ICD-10-CM

## 2017-05-27 DIAGNOSIS — Z9851 Tubal ligation status: Secondary | ICD-10-CM

## 2017-05-27 DIAGNOSIS — I2510 Atherosclerotic heart disease of native coronary artery without angina pectoris: Secondary | ICD-10-CM | POA: Diagnosis present

## 2017-05-27 DIAGNOSIS — T434X5A Adverse effect of butyrophenone and thiothixene neuroleptics, initial encounter: Secondary | ICD-10-CM | POA: Diagnosis present

## 2017-05-27 DIAGNOSIS — Z7984 Long term (current) use of oral hypoglycemic drugs: Secondary | ICD-10-CM | POA: Diagnosis not present

## 2017-05-27 DIAGNOSIS — G4700 Insomnia, unspecified: Secondary | ICD-10-CM | POA: Diagnosis present

## 2017-05-27 DIAGNOSIS — E119 Type 2 diabetes mellitus without complications: Secondary | ICD-10-CM | POA: Diagnosis present

## 2017-05-27 DIAGNOSIS — F251 Schizoaffective disorder, depressive type: Principal | ICD-10-CM | POA: Diagnosis present

## 2017-05-27 DIAGNOSIS — Z79899 Other long term (current) drug therapy: Secondary | ICD-10-CM | POA: Diagnosis not present

## 2017-05-27 DIAGNOSIS — F17210 Nicotine dependence, cigarettes, uncomplicated: Secondary | ICD-10-CM | POA: Diagnosis present

## 2017-05-27 DIAGNOSIS — G2401 Drug induced subacute dyskinesia: Secondary | ICD-10-CM | POA: Diagnosis present

## 2017-05-27 DIAGNOSIS — I10 Essential (primary) hypertension: Secondary | ICD-10-CM | POA: Diagnosis present

## 2017-05-27 DIAGNOSIS — F17290 Nicotine dependence, other tobacco product, uncomplicated: Secondary | ICD-10-CM | POA: Diagnosis present

## 2017-05-27 DIAGNOSIS — E785 Hyperlipidemia, unspecified: Secondary | ICD-10-CM | POA: Diagnosis present

## 2017-05-27 DIAGNOSIS — K219 Gastro-esophageal reflux disease without esophagitis: Secondary | ICD-10-CM | POA: Diagnosis present

## 2017-05-27 LAB — LIPID PANEL
Cholesterol: 130 mg/dL (ref 0–200)
HDL: 57 mg/dL (ref >40)
LDL Cholesterol: 62 mg/dL (ref 0–99)
Total CHOL/HDL Ratio: 2.3 RATIO
Triglycerides: 56 mg/dL (ref <150)
VLDL: 11 mg/dL (ref 0–40)

## 2017-05-27 LAB — GLUCOSE, CAPILLARY
GLUCOSE-CAPILLARY: 161 mg/dL — AB (ref 65–99)
GLUCOSE-CAPILLARY: 208 mg/dL — AB (ref 65–99)
Glucose-Capillary: 149 mg/dL — ABNORMAL HIGH (ref 65–99)

## 2017-05-27 LAB — TSH: TSH: 1.914 u[IU]/mL (ref 0.350–4.500)

## 2017-05-27 MED ORDER — CIPROFLOXACIN HCL 500 MG PO TABS
500.0000 mg | ORAL_TABLET | Freq: Two times a day (BID) | ORAL | Status: DC
  Administered 2017-05-27 – 2017-05-30 (×7): 500 mg via ORAL
  Filled 2017-05-27 (×8): qty 1

## 2017-05-27 MED ORDER — NICOTINE 21 MG/24HR TD PT24
21.0000 mg | MEDICATED_PATCH | Freq: Every day | TRANSDERMAL | Status: DC
  Administered 2017-05-27 – 2017-05-30 (×4): 21 mg via TRANSDERMAL
  Filled 2017-05-27 (×4): qty 1

## 2017-05-27 MED ORDER — PRAVASTATIN SODIUM 20 MG PO TABS
20.0000 mg | ORAL_TABLET | Freq: Every day | ORAL | Status: DC
  Administered 2017-05-27 – 2017-05-29 (×3): 20 mg via ORAL
  Filled 2017-05-27 (×4): qty 1

## 2017-05-27 MED ORDER — ACETAMINOPHEN 325 MG PO TABS
650.0000 mg | ORAL_TABLET | Freq: Four times a day (QID) | ORAL | Status: DC | PRN
  Administered 2017-05-27 – 2017-05-29 (×3): 650 mg via ORAL
  Filled 2017-05-27 (×3): qty 2

## 2017-05-27 MED ORDER — METFORMIN HCL 500 MG PO TABS
500.0000 mg | ORAL_TABLET | Freq: Two times a day (BID) | ORAL | Status: DC
  Administered 2017-05-27 – 2017-05-30 (×7): 500 mg via ORAL
  Filled 2017-05-27 (×7): qty 1

## 2017-05-27 MED ORDER — AMITRIPTYLINE HCL 25 MG PO TABS
25.0000 mg | ORAL_TABLET | Freq: Every day | ORAL | Status: DC
Start: 2017-05-27 — End: 2017-05-30
  Administered 2017-05-27 – 2017-05-29 (×3): 25 mg via ORAL
  Filled 2017-05-27 (×3): qty 1

## 2017-05-27 MED ORDER — PIOGLITAZONE HCL 15 MG PO TABS
15.0000 mg | ORAL_TABLET | Freq: Every day | ORAL | Status: DC
  Administered 2017-05-27 – 2017-05-30 (×4): 15 mg via ORAL
  Filled 2017-05-27 (×4): qty 1

## 2017-05-27 MED ORDER — LINAGLIPTIN 5 MG PO TABS
5.0000 mg | ORAL_TABLET | Freq: Every day | ORAL | Status: DC
  Administered 2017-05-27 – 2017-05-30 (×4): 5 mg via ORAL
  Filled 2017-05-27 (×4): qty 1

## 2017-05-27 MED ORDER — OLANZAPINE 10 MG PO TABS
10.0000 mg | ORAL_TABLET | Freq: Every day | ORAL | Status: DC
  Administered 2017-05-27: 10 mg via ORAL
  Filled 2017-05-27 (×2): qty 1

## 2017-05-27 MED ORDER — THIOTHIXENE 5 MG PO CAPS
5.0000 mg | ORAL_CAPSULE | Freq: Two times a day (BID) | ORAL | Status: DC
  Administered 2017-05-27: 5 mg via ORAL
  Filled 2017-05-27: qty 1

## 2017-05-27 MED ORDER — MAGNESIUM HYDROXIDE 400 MG/5ML PO SUSP: 30.0000 mL | Freq: Every day | ORAL | Status: DC | PRN

## 2017-05-27 MED ORDER — ALUM & MAG HYDROXIDE-SIMETH 200-200-20 MG/5ML PO SUSP: 30.0000 mL | ORAL | Status: DC | PRN

## 2017-05-27 MED ORDER — TRAZODONE HCL 100 MG PO TABS: 100.0000 mg | ORAL_TABLET | Freq: Every evening | ORAL | Status: DC | PRN

## 2017-05-27 MED ORDER — BENZTROPINE MESYLATE 1 MG PO TABS
0.5000 mg | ORAL_TABLET | Freq: Two times a day (BID) | ORAL | Status: DC
  Administered 2017-05-27 – 2017-05-30 (×7): 0.5 mg via ORAL
  Filled 2017-05-27 (×7): qty 1

## 2017-05-27 NOTE — Progress Notes (Signed)
Recreation Therapy Notes   Date: 12.17.2018  Time: 9:30 am  Location: Craft Room  Behavioral response: N/A  Intervention Topic: Creative Expressions  Discussion/Intervention: Patient did not attend group. Clinical Observations/Feedback:  Patient did not attend group. Erica Mueller LRT/CTRS           [name] 05/27/2017 12:58 PM

## 2017-05-27 NOTE — Tx Team (Signed)
Initial Treatment Plan 05/27/2017 1:30 AM Sherilyn Banker WTU:[id number]    PATIENT STRESSORS: Health problems Marital or family conflict Medication change or noncompliance   PATIENT STRENGTHS: Ability for insight Active sense of humor Communication skills Motivation for treatment/growth Supportive family/friends   PATIENT IDENTIFIED PROBLEMS: "taking my medications" 05/27/17  "taking care of my health" 05/27/17  "getting right with my family" 05/27/17                 DISCHARGE CRITERIA:  Ability to meet basic life and health needs Improved stabilization in mood, thinking, and/or behavior Medical problems require only outpatient monitoring Motivation to continue treatment in a less acute level of care Reduction of life-threatening or endangering symptoms to within safe limits  PRELIMINARY DISCHARGE PLAN: Attend aftercare/continuing care group Outpatient therapy Return to previous living arrangement Return to previous work or school arrangements  PATIENT/FAMILY INVOLVEMENT: This treatment plan has been presented to and reviewed with the patient, Erica Mueller.  The patient has been given the opportunity to ask questions and make suggestions.   Lenox Ponds, RN 05/27/2017, 1:30 AM
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
## 2017-05-27 NOTE — Progress Notes (Signed)
D:Pt denies SI/HI/AVH during admissions. Pt able to verbally contract for safety and remain safe she states while on the unit. Pt is pleasant and cooperative upon interaction. Pt. denies any complaints to this Clinical research associate.  A: Q x 15 minute observation checks were completed for safety. Patient was provided with education. Patient  was encourage to attend groups, participate in unit activities and continue with plan of care during stay.   R:Patient is complaint with admissions process. Pt reports sleeping and eating better since admission to hospital.             Patient slept for Estimated Hours of 4; Precautionary checks every 15 minutes for safety maintained, room free of safety hazards, patient sustains no injury or falls during this shift.

## 2017-05-27 NOTE — Progress Notes (Signed)
Recreation Therapy Notes   Date: 12.17 .2018  Time: 3:00pm  Location: Outside  Behavioral response: N/A  Group Type: Leisure  Participation level: N/A  Communication: Patient did not attend group.  Comments: N/A  Misty Foutz LRT/CTRS         Shaili Donalson 05/27/2017 4:58 PM

## 2017-05-27 NOTE — Progress Notes (Signed)
D: Received patient from [facility]. Patient skin assessment completed with Claris Che, RN and female BHT, skin is intact with a scar on the patient's left upper arm and bilateral knee scares. No contraband found and all prohibited unit items locked away and secured (see belongings sheet/pharmacy sheet). Pt. Was admitted under the services of Dr. Johnella Moloney.    A: Patient oriented to unit/room/call light.Patient was encourage to participate in unit activities and continue with plan of care. Q x 15 minute observation checks were completed/implemented for safety.   R: Patient is receptive to treatment and safety maintained on unit. Pt during assessment with this writer denies SI/HI and denies AVH. Pt verbally contract for safety and states she can remain safe while on the unit. Pt compliant with admissions process.

## 2017-05-27 NOTE — Tx Team (Signed)
Interdisciplinary Treatment and Diagnostic Plan Update  05/27/2017 Time of Session: 10:30am Erica Mueller MRN: [id number]  Principal Diagnosis: <principal problem not specified>  Secondary Diagnoses: Active Problems:   Schizophrenia (HCC)   Current Medications:  Current Facility-Administered Medications  Medication Dose Route Frequency Provider Last Rate Last Dose  . acetaminophen (TYLENOL) tablet 650 mg  650 mg Oral Q6H PRN Clapacs, Jackquline DenmarkJohn T, MD   650 mg at 05/27/17 1457  . alum & mag hydroxide-simeth (MAALOX/MYLANTA) 200-200-20 MG/5ML suspension 30 mL  30 mL Oral Q4H PRN Clapacs, John T, MD      . amitriptyline (ELAVIL) tablet 25 mg  25 mg Oral QHS Clapacs, John T, MD      . benztropine (COGENTIN) tablet 0.5 mg  0.5 mg Oral BID Clapacs, Jackquline DenmarkJohn T, MD   0.5 mg at 05/27/17 [id number]  . ciprofloxacin (CIPRO) tablet 500 mg  500 mg Oral BID Clapacs, John T, MD   500 mg at 05/27/17 [id number]  . linagliptin (TRADJENTA) tablet 5 mg  5 mg Oral Daily Clapacs, Jackquline DenmarkJohn T, MD   5 mg at 05/27/17 [id number]  . magnesium hydroxide (MILK OF MAGNESIA) suspension 30 mL  30 mL Oral Daily PRN Clapacs, John T, MD      . metFORMIN (GLUCOPHAGE) tablet 500 mg  500 mg Oral BID WC Clapacs, Jackquline DenmarkJohn T, MD   500 mg at 05/27/17 0829  . nicotine (NICODERM CQ - dosed in mg/24 hours) patch 21 mg  21 mg Transdermal Daily McNew, Ileene HutchinsonHolly R, MD   21 mg at 05/27/17 [id number]  . OLANZapine (ZYPREXA) tablet 10 mg  10 mg Oral QHS McNew, Holly R, MD      . pioglitazone (ACTOS) tablet 15 mg  15 mg Oral Daily Clapacs, Jackquline DenmarkJohn T, MD   15 mg at 05/27/17 0829  . pravastatin (PRAVACHOL) tablet 20 mg  20 mg Oral q1800 Clapacs, John T, MD      . traZODone (DESYREL) tablet 100 mg  100 mg Oral QHS PRN Clapacs, Jackquline DenmarkJohn T, MD       PTA Medications: Medications Prior to Admission  Medication Sig Dispense Refill Last Dose  . amitriptyline (ELAVIL) 25 MG tablet Take 25 mg by mouth 2 (two) times daily.     . hydrOXYzine (VISTARIL) 50 MG capsule Take 1 capsule by mouth at  bedtime.   unknown at taking  . lovastatin (MEVACOR) 40 MG tablet Take 40 mg by mouth at bedtime.     . metFORMIN (GLUCOPHAGE) 500 MG tablet Take by mouth 2 (two) times daily with a meal.     . pioglitazone (ACTOS) 15 MG tablet Take 15 mg by mouth daily.     . risperiDONE (RISPERDAL) 2 MG tablet Take 1 tablet by mouth at bedtime.  tab x 2 nights then increase to 1 tab hs   unknown at taking  . sitaGLIPtin (JANUVIA) 100 MG tablet Take 100 mg by mouth daily.     Marland Kitchen. thiothixene (NAVANE) 10 MG capsule Take 10 mg by mouth 3 (three) times daily.     Marland Kitchen. venlafaxine XR (EFFEXOR-XR) 150 MG 24 hr capsule Take 150 mg by mouth daily with breakfast.       Patient Stressors: Health problems Marital or family conflict Medication change or noncompliance  Patient Strengths: Ability for insight Active sense of humor Communication skills Motivation for treatment/growth Supportive family/friends  Treatment Modalities: Medication Management, Group therapy, Case management,  1 to 1 session with clinician, Psychoeducation, Recreational therapy.   Physician Treatment Plan  for Primary Diagnosis: <principal problem not specified> Long Term Goal(s):     Short Term Goals:    Medication Management: Evaluate patient's response, side effects, and tolerance of medication regimen.  Therapeutic Interventions: 1 to 1 sessions, Unit Group sessions and Medication administration.  Evaluation of Outcomes: Progressing  Physician Treatment Plan for Secondary Diagnosis: Active Problems:   Schizophrenia (HCC)  Long Term Goal(s):     Short Term Goals:       Medication Management: Evaluate patient's response, side effects, and tolerance of medication regimen.  Therapeutic Interventions: 1 to 1 sessions, Unit Group sessions and Medication administration.  Evaluation of Outcomes: Progressing   RN Treatment Plan for Primary Diagnosis: <principal problem not specified> Long Term Goal(s): Knowledge of disease and  therapeutic regimen to maintain health will improve  Short Term Goals: Ability to verbalize feelings will improve, Ability to identify and develop effective coping behaviors will improve and Compliance with prescribed medications will improve  Medication Management: RN will administer medications as ordered by provider, will assess and evaluate patient's response and provide education to patient for prescribed medication. RN will report any adverse and/or side effects to prescribing provider.  Therapeutic Interventions: 1 on 1 counseling sessions, Psychoeducation, Medication administration, Evaluate responses to treatment, Monitor vital signs and CBGs as ordered, Perform/monitor CIWA, COWS, AIMS and Fall Risk screenings as ordered, Perform wound care treatments as ordered.  Evaluation of Outcomes: Progressing   LCSW Treatment Plan for Primary Diagnosis: <principal problem not specified> Long Term Goal(s): Safe transition to appropriate next level of care at discharge, Engage patient in therapeutic group addressing interpersonal concerns.  Short Term Goals: Engage patient in aftercare planning with referrals and resources, Identify triggers associated with mental health/substance abuse issues and Increase skills for wellness and recovery  Therapeutic Interventions: Assess for all discharge needs, 1 to 1 time with Social worker, Explore available resources and support systems, Assess for adequacy in community support network, Educate family and significant other(s) on suicide prevention, Complete Psychosocial Assessment, Interpersonal group therapy.  Evaluation of Outcomes: Progressing   Progress in Treatment: Attending groups: Yes. Participating in groups: Yes. Taking medication as prescribed: Yes. Toleration medication: Yes. Family/Significant other contact made: No, will contact:    Patient understands diagnosis: Yes. Discussing patient identified problems/goals with staff: Yes. Medical  problems stabilized or resolved: Yes. Denies suicidal/homicidal ideation: Yes. Issues/concerns per patient self-inventory: Yes. Other:    New problem(s) identified: No, Describe:     New Short Term/Long Term Goal(s): "get back on the right medications and get voices better"  Discharge Plan or Barriers: Discharge home with follow up at outpatient provider.  Reason for Continuation of Hospitalization: Hallucinations Medication stabilization  Estimated Length of Stay:5-7 days    Recreational Therapy: Patient Stressors: Medication Change Patient Goal: Patient will attend and participate in Recreation Therapy Group Sessions x5 days   Attendees: Patient:Erica Mueller 05/27/2017 3:04 PM  Physician: Corinna Gab, RN 05/27/2017 3:04 PM  Nursing: Marjo Bicker, LRT 05/27/2017 3:04 PM  RN Care Manager: 05/27/2017 3:04 PM  Social Worker: Jake Shark, LCSW 05/27/2017 3:04 PM  Recreational Therapist: Garret Reddish, LRT 05/27/2017 3:04 PM  Other:  05/27/2017 3:04 PM  Other:  05/27/2017 3:04 PM  Other: 05/27/2017 3:04 PM    Scribe for Treatment Team: Glennon Mac, LCSW 05/27/2017 3:04 PM

## 2017-05-27 NOTE — Plan of Care (Signed)
Pt. Verbalizes understanding of general education given upon admissions. Pt denies all psych symptoms to this Clinical research associate. Pt reports sleeping and eating, "better". Pt able to remain safe on the unit and contract for safety. Pt compliant with this Clinical research associate and admissions process.

## 2017-05-27 NOTE — [facility] Suicide Risk Assessment (Signed)
[facility] Admission Suicide Risk Assessment   Nursing information obtained from:    Demographic factors:    Current Mental Status:   AA Female, lives in house with adult grandchildren Loss Factors:    Historical Factors:    Risk Reduction Factors:     Total Time spent with patient: 1 hour Principal Problem: Schizophrenia (HCC) Diagnosis:   Patient Active Problem List   Diagnosis Date Noted  . Schizophrenia (HCC) [F20.9] 05/27/2017    Priority: High  . Schizoaffective disorder, depressive type (HCC) [F25.1] 04/02/2017  . Diabetes mellitus without complication (HCC) [E11.9] 04/02/2017  . Hypertension [I10] 04/02/2017  . Noncompliance [Z91.19] 04/02/2017   Subjective Data: See H&P  Continued Clinical Symptoms:  Alcohol Use Disorder Identification Test Final Score (AUDIT): 2 The "Alcohol Use Disorders Identification Test", Guidelines for Use in Primary Care, Second Edition.  World Science writer [facility]). Score between 0-7:  no or low risk or alcohol related problems. Score between 8-15:  moderate risk of alcohol related problems. Score between 16-19:  high risk of alcohol related problems. Score 20 or above:  warrants further diagnostic evaluation for alcohol dependence and treatment.   CLINICAL FACTORS:   Schizophrenia:   Command hallucinatons      COGNITIVE FEATURES THAT CONTRIBUTE TO RISK:  None    SUICIDE RISK:   Minimal: No identifiable suicidal ideation.  Patients presenting with no risk factors but with morbid ruminations; may be classified as minimal risk based on the severity of the depressive symptoms  PLAN OF CARE: See H&P  I certify that inpatient services furnished can reasonably be expected to improve the patient's condition.   Haskell Riling, MD 05/27/2017, 5:46 PM

## 2017-05-27 NOTE — [facility] Group Notes (Signed)
05/27/2017 1pm  Type of Therapy and Topic:  Group Therapy:  Overcoming Obstacles  Participation Level:  Did Not Attend    Description of Group:    In this group patients will be encouraged to explore what they see as obstacles to their own wellness and recovery. They will be guided to discuss their thoughts, feelings, and behaviors related to these obstacles. The group will process together ways to cope with barriers, with attention given to specific choices patients can make. Each patient will be challenged to identify changes they are motivated to make in order to overcome their obstacles. This group will be process-oriented, with patients participating in exploration of their own experiences as well as giving and receiving support and challenge from other group members.   Therapeutic Goals: 1. Patient will identify personal and current obstacles as they relate to admission. 2. Patient will identify barriers that currently interfere with their wellness or overcoming obstacles.  3. Patient will identify feelings, thought process and behaviors related to these barriers. 4. Patient will identify two changes they are willing to make to overcome these obstacles:      Summary of Patient Progress  Did not attend   Therapeutic Modalities:   Cognitive Behavioral Therapy Solution Focused Therapy Motivational Interviewing Relapse Prevention Therapy    Johny Shears MSW, [facility] 05/27/2017 2:43 PM

## 2017-05-27 NOTE — [facility] Group Notes (Signed)
[facility] Group Notes:  (Nursing/MHT/Case Management/Adjunct)  Date:  05/27/2017  Time:  10:01 PM  Type of Therapy:  Group Therapy  Participation Level:  Active  Participation Quality:  Appropriate  Affect:  Appropriate  Cognitive:  Appropriate  Insight:  Appropriate  Engagement in Group:  Supportive  Modes of Intervention:  Support  Summary of Progress/Problems:  Burt Ek 05/27/2017, 10:01 PM

## 2017-05-27 NOTE — Progress Notes (Signed)
CH and patient walked around unit. Patient did not have much to say. She didn't understand who sent me to speak with her. She would like to talk later, she was focused on getting something to drink. CH walked patient from dayroom back to her room. Patient stated she would go back to bed.   05/27/17 1900  Clinical Encounter Type  Visited With Patient;Health care provider  Visit Type Initial;Spiritual support  Referral From Nurse

## 2017-05-27 NOTE — Plan of Care (Signed)
Patient is alert and oriented with mild confusion as evidenced by having to be redirected to her room several times throughout the day. Patient affect is pleasant. Patient denies SI, HI and AVH. Patient is compliant with medications. Patient has no complaints today. Patient is concerned about her electricity being turned off while she is in the hospital.  Patient is eating meals adequately. Nurse will continue to encourage patient to attend groups. Safety Checks Q 15 will continue. Spiritual Needs Ability to function at adequate level 05/27/2017 0927 - Progressing by Leamon Arnt, RN   Activity: Interest or engagement in activities will improve 05/27/2017 0927 - Progressing by Leamon Arnt, RN Sleeping patterns will improve 05/27/2017 0927 - Progressing by Leamon Arnt, RN   Education: Knowledge of [place] [facility] Education information/materials will improve 05/27/2017 [phone] - Progressing by Leamon Arnt, RN Emotional status will improve 05/27/2017 0927 - Progressing by Leamon Arnt, RN Mental status will improve 05/27/2017 0927 - Progressing by Leamon Arnt, RN Verbalization of understanding the information provided will improve 05/27/2017 0927 - Progressing by Leamon Arnt, RN

## 2017-05-27 NOTE — Progress Notes (Signed)
Recreation Therapy Notes  INPATIENT RECREATION THERAPY ASSESSMENT  Patient Details Name: Erica Mueller MRN: [id number] DOB: 01-25-1951 Today's Date: 05/27/2017  Patient Stressors: Other (Comment)(medication change)  Coping Skills:   Other (Comment)(Walk dogs, Watch TV)  Personal Challenges: (None)  Leisure Interests (2+):  Exercise - Walking, Individual - TV, Individual - Phone  Awareness of Community Resources:  Yes  Community Resources:  [employer]  Current Use: Yes  If no, Barriers?:    Patient Strengths:  Coping skills  Patient Identified Areas of Improvement:  Nothing  Current Recreation Participation:  N/A  Patient Goal for Hospitalization:  To get edication change and make myself feel better  [place] of Residence:  [place] Residence:  [place]   Current SI (including self-harm):  No  Current HI:  No  Consent to Intern Participation: N/A   Ajna Moors 05/27/2017, 2:51 PM

## 2017-05-27 NOTE — Plan of Care (Signed)
Patient needs reinforcement to navigate unit, but is able to be oriented. Patient's safety is maintained on unit. Patient is educated about prescribed medications. Patient states "I feel great" during assessment today.    Progressing Spiritual Needs Ability to function at adequate level 05/27/2017 2000 - Progressing by Berkley Harvey, RN Education: Knowledge of  General Education information/materials will improve 05/27/2017 2000 - Progressing by Addison Naegeli I, RN Emotional status will improve 05/27/2017 2000 - Progressing by Addison Naegeli I, RN Safety: Periods of time without injury will increase 05/27/2017 2000 - Progressing by Berkley Harvey, RN Education: Knowledge of the prescribed therapeutic regimen will improve 05/27/2017 2000 - Progressing by Berkley Harvey, RN

## 2017-05-28 LAB — GLUCOSE, CAPILLARY
GLUCOSE-CAPILLARY: 205 mg/dL — AB (ref 65–99)
Glucose-Capillary: 326 mg/dL — ABNORMAL HIGH (ref 65–99)

## 2017-05-28 LAB — HEMOGLOBIN A1C
Hgb A1c MFr Bld: 6.6 % — ABNORMAL HIGH (ref 4.8–5.6)
Mean Plasma Glucose: 143 mg/dL

## 2017-05-28 NOTE — Progress Notes (Signed)
D: Patient denies SI/HI/AVH. Patient is pleasant and confused. Patient requires frequent redirection and reorientation while on unit. Patient requests education on medications and it was provided by this nurse. Patient is present in milieu and seen interaction with peers in day room.  A: Patient was assessed by this nurse. Patient received scheduled medications. Q x 15 minute observation checks were completed for safety. Patient was provided with verbal education on provided medications. Patient care plan was reviewed. Patient was offered support and encouragement. Patient was encourage to attend groups, participate in unit activities and continue with plan of care.   R: Patient adheres with scheduled medication. Patient has no complaints of pain at this time. Patient is receptive to treatment and safety maintained on unit. Patient had an estimated 5 hours of restful sleep.

## 2017-05-28 NOTE — [facility] Group Notes (Signed)
05/28/2017 1PM  Type of Therapy/Topic:  Group Therapy:  Feelings about Diagnosis  Participation Level:  Did Not Attend   Description of Group:   This group will allow patients to explore their thoughts and feelings about diagnoses they have received. Patients will be guided to explore their level of understanding and acceptance of these diagnoses. Facilitator will encourage patients to process their thoughts and feelings about the reactions of others to their diagnosis and will guide patients in identifying ways to discuss their diagnosis with significant others in their lives. This group will be process-oriented, with patients participating in exploration of their own experiences, giving and receiving support, and processing challenge from other group members.   Therapeutic Goals: 1. Patient will demonstrate understanding of diagnosis as evidenced by identifying two or more symptoms of the disorder 2. Patient will be able to express two feelings regarding the diagnosis 3. Patient will demonstrate their ability to communicate their needs through discussion and/or role play  Summary of Patient Progress: Pt was invited to attend group but chose not to attend. CSW will continue to encourage pt to attend group throughout their admission.    Therapeutic Modalities:   Cognitive Behavioral Therapy Brief Therapy   Heidi Dach, MSW, LCSW 05/28/2017 1:49 PM

## 2017-05-28 NOTE — Progress Notes (Signed)
Recreation Therapy Notes  Date: 12.18.2018  Time: 9:30 am  Location: Craft Room  Behavioral response: N/A  Intervention Topic: Values  Discussion/Intervention: Patient did not attend group. Clinical Observations/Feedback:  Patient did not attend group. Nakenya Theall LRT/CTRS          Johnta Couts 05/28/2017 11:09 AM

## 2017-05-28 NOTE — Plan of Care (Signed)
Patient demonstrates that that he can function at an adequate level. Patient has participated in unit activities thus far. Patient states that she slept well last night but is "feeling weak and aching all over". Patient verbalizes understanding of the information that has been provided to her. Patient denies SI/HI and visual hallucinations at this time. Patient endorses auditory hallucinations but "I don't know what they're saying". Patient states that her depression level is rated as a "5/10" because "what's going on here". Patient states that she is anxious but can't rate it and patient feels like the nicotine patch will help her anxiety. Patient has demonstrated self-control thus far on the unit. Patient has the ability to identify available resources that can assist in meeting her health needs. Patient has been in compliance with her treatment regimen. Patient has been improving with maintaining her clinical measurements within normal levels. Patient has asked this Clinical research associate for supplies to participate in self-care. Patient has remained free from injury thus far and is safe on the unit at this time.

## 2017-05-28 NOTE — Progress Notes (Signed)
D- Patient alert and oriented. Patient presents in an anxious/depressed mood on assessment. Patient appears to be worried about her medications that she's taking and states that she's "feeling weak and aching all over". Patient rates her pain level a "6/10" reporting that her "back, head, and neck is hurting" but requests some tylenol for her headache from this writer. Patient endorses hearing voices but "I don't know what they're saying". Patient rates her depression as a "5/10" stating that "what's going on here". Patient states that she is feeling anxious but can't rate her anxiety for this Clinical research associate. Patient states that she feels the nicotine patch will "help my anxiety". Patient denies SI/HI and visual hallucinations at this time.  A- Scheduled medications administered to patient, per MD orders. Support and encouragement provided.  Routine safety checks conducted every 15 minutes.  Patient informed to notify staff with problems or concerns.  R- No adverse drug reactions noted. Patient contracts for safety at this time. Patient compliant with medications and treatment plan. Patient receptive, calm, and cooperative. Patient interacts well with others on the unit.  Patient remains safe at this time.

## 2017-05-28 NOTE — Progress Notes (Signed)
D: Patient denies SI/HI/AVH. Patient is in milieu interacting with peers. Patient is mildly confused and requires some redirection. Patient states "I just want to go home and smoke a cigarette and watch my shows." Patient refused Zyprexa and CBG check this PM, patient states "I do not get my sugar checked, and that white pill is not my medication." Patient is at nurses station multiple times with complaint of losing her two 40 oz beers when she came in. Patient belongings are located in PD safe. Security was contacted this shift and made aware, no report back at this time.   A: Patient was assessed by this nurse. Patient received scheduled medications. Q x 15 minute observation checks were completed for safety. Patient was provided with verbal education on provided medications. Patient care plan was reviewed. Patient was offered support and encouragement. Patient was encourage to attend groups, participate in unit activities and continue with plan of care.   R: Patient refused with scheduled medication Zyprexa, and CBG check this PM. Patient has no complaints of pain at this time. Patient is receptive to treatment and safety maintained on unit. Patient had an estimated 5 hours of restful sleep.

## 2017-05-28 NOTE — Progress Notes (Signed)
Patient ID: Erica Mueller, female   DOB: August 31, 1950, 66 y.o.   MRN: [id number] CSW approached Pt to attempt PSA.  She was lying in bed and would not acknowledge CSW even with calling Pt's name loudly and opening eyes to look at me.  CSW told her would return to attempt again tomorrow.  Pt's RN said that she was continuing to do the same behavior with her.  Jake Shark, LCSW

## 2017-05-28 NOTE — Progress Notes (Signed)
[facility] MD Progress Note  05/28/2017 3:12 PM Erica Mueller  MRN:  [id number] Subjective:  Pt states that she is feeling slightly better today. Mood is improving and feeling less agitated. She still has a lot of racing thoughts but denies AH. She denies HI or thoughts of hurting others. Denies SI today. She feels the switch to Zyprexa has been helpful. She has less writhing movements of her mouth today. She is sleeping better.   Principal Problem: Schizophrenia (HCC) Diagnosis:   Patient Active Problem List   Diagnosis Date Noted  . Schizophrenia (HCC) [F20.9] 05/27/2017    Priority: High  . Schizoaffective disorder, depressive type (HCC) [F25.1] 04/02/2017  . Diabetes mellitus without complication (HCC) [E11.9] 04/02/2017  . Hypertension [I10] 04/02/2017  . Noncompliance [Z91.19] 04/02/2017   Total Time spent with patient: 20 minutes  Past Psychiatric History: See H&P  Past Medical History:  Past Medical History:  Diagnosis Date  . Coronary artery disease   . Depression   . Diabetes mellitus without complication (HCC)   . Elevated lipids   . GERD (gastroesophageal reflux disease)   . Hypertension   . Syphilis (acquired)     Past Surgical History:  Procedure Laterality Date  . COLONOSCOPY    . COLONOSCOPY WITH PROPOFOL N/A 09/01/2015   Procedure: COLONOSCOPY WITH PROPOFOL;  Surgeon: Wallace Cullens, MD;  Location: [facility] ENDOSCOPY;  Service: Gastroenterology;  Laterality: N/A;  . TUBAL LIGATION    . wisdom teeth removal     Family History: History reviewed. No pertinent family history. Family Psychiatric  History: See h&P Social History:  Social History   Substance and Sexual Activity  Alcohol Use Yes   Comment: occassionally     Social History   Substance and Sexual Activity  Drug Use No    Social History   Socioeconomic History  . Marital status: Divorced    Spouse name: None  . Number of children: None  . Years of education: None  . Highest education level: None   Social Needs  . Financial resource strain: None  . Food insecurity - worry: None  . Food insecurity - inability: None  . Transportation needs - medical: None  . Transportation needs - non-medical: None  Occupational History  . None  Tobacco Use  . Smoking status: Current Every Day Smoker    Types: Cigarettes  . Smokeless tobacco: Never Used  Substance and Sexual Activity  . Alcohol use: Yes    Comment: occassionally  . Drug use: No  . Sexual activity: None  Other Topics Concern  . None  Social History Narrative  . None   Additional Social History:                         Sleep: Fair  Appetite:  Good  Current Medications: Current Facility-Administered Medications  Medication Dose Route Frequency Provider Last Rate Last Dose  . acetaminophen (TYLENOL) tablet 650 mg  650 mg Oral Q6H PRN Clapacs, Jackquline Denmark, MD   650 mg at 05/28/17 0846  . alum & mag hydroxide-simeth (MAALOX/MYLANTA) 200-200-20 MG/5ML suspension 30 mL  30 mL Oral Q4H PRN Clapacs, John T, MD      . amitriptyline (ELAVIL) tablet 25 mg  25 mg Oral QHS Clapacs, Jackquline Denmark, MD   25 mg at 05/27/17 2217  . benztropine (COGENTIN) tablet 0.5 mg  0.5 mg Oral BID Clapacs, Jackquline Denmark, MD   0.5 mg at 05/28/17 0844  . ciprofloxacin (  CIPRO) tablet 500 mg  500 mg Oral BID Clapacs, Jackquline Denmark, MD   500 mg at 05/28/17 0947  . linagliptin (TRADJENTA) tablet 5 mg  5 mg Oral Daily Clapacs, Jackquline Denmark, MD   5 mg at 05/28/17 0844  . magnesium hydroxide (MILK OF MAGNESIA) suspension 30 mL  30 mL Oral Daily PRN Clapacs, John T, MD      . metFORMIN (GLUCOPHAGE) tablet 500 mg  500 mg Oral BID WC Clapacs, Jackquline Denmark, MD   500 mg at 05/28/17 0844  . nicotine (NICODERM CQ - dosed in mg/24 hours) patch 21 mg  21 mg Transdermal Daily Danyeal Akens, Ileene Hutchinson, MD   21 mg at 05/28/17 0846  . OLANZapine (ZYPREXA) tablet 10 mg  10 mg Oral QHS Trishelle Devora, Ileene Hutchinson, MD   10 mg at 05/27/17 2217  . pioglitazone (ACTOS) tablet 15 mg  15 mg Oral Daily Clapacs, Jackquline Denmark, MD   15 mg  at 05/28/17 0844  . pravastatin (PRAVACHOL) tablet 20 mg  20 mg Oral q1800 Clapacs, Jackquline Denmark, MD   20 mg at 05/27/17 1630  . traZODone (DESYREL) tablet 100 mg  100 mg Oral QHS PRN Clapacs, Jackquline Denmark, MD        Lab Results:  Results for orders placed or performed during the hospital encounter of 05/27/17 (from the past 48 hour(s))  Hemoglobin A1c     Status: Abnormal   Collection Time: 05/27/17  6:57 AM  Result Value Ref Range   Hgb A1c MFr Bld 6.6 (H) 4.8 - 5.6 %    Comment: (NOTE)         Prediabetes: 5.7 - 6.4         Diabetes: >6.4         Glycemic control for adults with diabetes: <7.0    Mean Plasma Glucose 143 mg/dL    Comment: (NOTE) Performed At: [facility] [address] [place], [place] [id number] Jolene Schimke MD [id number]   Lipid panel     Status: None   Collection Time: 05/27/17  6:57 AM  Result Value Ref Range   Cholesterol 130 0 - 200 mg/dL   Triglycerides 56 <469 mg/dL   HDL 57 >62 mg/dL   Total CHOL/HDL Ratio 2.3 RATIO   VLDL 11 0 - 40 mg/dL   LDL Cholesterol 62 0 - 99 mg/dL    Comment:        Total Cholesterol/HDL:CHD Risk Coronary Heart Disease Risk Table                     Men   Women  1/2 Average Risk   3.4   3.3  Average Risk       5.0   4.4  2 X Average Risk   9.6   7.1  3 X Average Risk  23.4   11.0        Use the calculated Patient Ratio above and the CHD Risk Table to determine the patient's CHD Risk.        ATP III CLASSIFICATION (LDL):  <100     mg/dL   Optimal  952-841  mg/dL   Near or Above                    Optimal  130-159  mg/dL   Borderline  324-401  mg/dL   High  >027     mg/dL   Very High   TSH     Status: None  Collection Time: 05/27/17  6:57 AM  Result Value Ref Range   TSH 1.914 0.350 - 4.500 uIU/mL    Comment: Performed by a 3rd Generation assay with a functional sensitivity of <=0.01 uIU/mL.  Glucose, capillary     Status: Abnormal   Collection Time: 05/27/17  8:24 AM  Result Value Ref Range    Glucose-Capillary 161 (H) 65 - 99 mg/dL  Glucose, capillary     Status: Abnormal   Collection Time: 05/27/17  3:01 PM  Result Value Ref Range   Glucose-Capillary 149 (H) 65 - 99 mg/dL  Glucose, capillary     Status: Abnormal   Collection Time: 05/27/17 10:20 PM  Result Value Ref Range   Glucose-Capillary 208 (H) 65 - 99 mg/dL  Glucose, capillary     Status: Abnormal   Collection Time: 05/28/17  8:42 AM  Result Value Ref Range   Glucose-Capillary 326 (H) 65 - 99 mg/dL    Blood Alcohol level:  Lab Results  Component Value Date   ETH <10 05/26/2017   ETH <10 04/01/2017    Metabolic Disorder Labs: Lab Results  Component Value Date   HGBA1C 6.6 (H) 05/27/2017   MPG 143 05/27/2017   No results found for: PROLACTIN Lab Results  Component Value Date   CHOL 130 05/27/2017   TRIG 56 05/27/2017   HDL 57 05/27/2017   CHOLHDL 2.3 05/27/2017   VLDL 11 05/27/2017   LDLCALC 62 05/27/2017    Physical Findings: AIMS: Facial and Oral Movements Muscles of Facial Expression: Mild Lips and Perioral Area: Severe Jaw: Moderate Tongue: Severe,Extremity Movements Upper (arms, wrists, hands, fingers): None, normal Lower (legs, knees, ankles, toes): None, normal, Trunk Movements Neck, shoulders, hips: None, normal, Overall Severity Severity of abnormal movements (highest score from questions above): Moderate Incapacitation due to abnormal movements: Minimal Patient's awareness of abnormal movements (rate only patient's report): Aware, mild distress, Dental Status Current problems with teeth and/or dentures?: Yes Does patient usually wear dentures?: No  CIWA:  CIWA-Ar Total: 0 COWS:  COWS Total Score: 1  Musculoskeletal: Strength & Muscle Tone: within normal limits Gait & Station: normal Patient leans: N/A  Psychiatric Specialty Exam: Physical Exam  ROS  Blood pressure (!) 163/75, pulse 100, temperature 99.1 F (37.3 C), resp. rate 16, height 5\' 2"  (1.575 m), weight 64.4 kg (142  lb), SpO2 100 %.Body mass index is 25.97 kg/m.  General Appearance: Casual  Eye Contact:  Good  Speech:  Clear and Coherent  Volume:  Normal  Mood:  Depressed  Affect:  Congruent  Thought Process:  Coherent and Goal Directed  Orientation:  Full (Time, Place, and Person)  Thought Content:  Negative  Suicidal Thoughts:  No  Homicidal Thoughts:  No  Memory:  Immediate;   Fair  Judgement:  Fair  Insight:  Fair  Psychomotor Activity:  Normal  Concentration:  Concentration: Fair  Recall:  [employer]  Fund of Knowledge:  Fair  Language:  Fair  Akathisia:  No      Assets:  Communication Skills Desire for Improvement Resilience  ADL's:  Intact  Cognition:  WNL  Sleep:  Number of Hours: 5     Treatment Plan Summary: 66 yo female admitted due to HI toward her granddaughter. Mood is improving. She continues to have a lot of racing thoughts through the day. She feels Zyprexa has been helpful for her  Plan:  Schizophrenia -Continue Zyprexa 10 mg qhs. Add 5 mg in the morning  Mood -Continue Amitriptyline 50 mg  daily  DM -Continue linagliptin, metformin, and pioglitazone  Dispo -Pt will return home on discharge. She will need follow up   Haskell Riling, MD 05/28/2017, 3:12 PM

## 2017-05-28 NOTE — Progress Notes (Signed)
Inpatient Diabetes Program Recommendations  AACE/ADA: New Consensus Statement on Inpatient Glycemic Control (2015)  Target Ranges:  Prepandial:   less than 140 mg/dL      Peak postprandial:   less than 180 mg/dL (1-2 hours)      Critically ill patients:  140 - 180 mg/dL   Results for ANSHU, STAAL (MRN [id number]) as of 05/28/2017 10:20  Ref. Range 05/27/2017 08:24 05/27/2017 15:01 05/27/2017 22:20 05/28/2017 08:42  Glucose-Capillary Latest Ref Range: 65 - 99 mg/dL 959 (H) 747 (H) 185 (H) 326 (H)   Review of Glycemic Control  Diabetes history: DM 2 Outpatient Diabetes medications: Actos 15 mg Daily, Januvia 100 mg Daily, Metformin 500 mg BID Current orders for Inpatient glycemic control: Actos 15 mg Daily, Tradjenta 5 mg Daily, Metformin 500 mg BID  Inpatient Diabetes Program Recommendations:    A1c 6.6% on 05/27/17  Home regimen ordered, glucose elevations. Consider Novolog Moderate Correction scale 0-15 units tid + Novolog HS scale while inpatient.  Thanks,  Christena Deem RN, MSN, [facility] Inpatient Diabetes Coordinator Team Pager [phone] (8a-5p)

## 2017-05-28 NOTE — Progress Notes (Deleted)
D- Patient alert and oriented. Patient presents in a depressed/anxious mood on assessment stating that he slept fair. Patient reports that he is in pain rating his pain level as a "6/10" stating that "my knee goes back and down to my ankle bone". When this writer asked the patient why he's here patient states "it's a long process". Patient states "I hum when I walk and sometimes I grunt". Patient endorses passive SI stating that "I want to cut my throat with a knife" but contracts for safety at this time. Patient denies HI/AVH at this time. Patient rates his depression as a "7/10" stating he doesn't know why he's depressed "I don't know maybe because of my schizophrenia". Patient rates his anxiety level as "10/10" stating "I'm anxious to graduate from school. I'm going for culinary arts".   A- Scheduled medications administered to patient, per MD orders. Support and encouragement provided.  Routine safety checks conducted every 15 minutes.  Patient informed to notify staff with problems or concerns.   R- No adverse drug reactions noted. Patient contracts for safety at this time. Patient compliant with medications and treatment plan. Patient receptive, calm, and cooperative. Patient interacts well with others on the unit.  Patient remains safe at this time.

## 2017-05-28 NOTE — Plan of Care (Signed)
Patient oriented to unit. Patient safety maintained on unit. Patient able to verbalize mood and denies SI/HI/AVH.   Progressing Spiritual Needs Ability to function at adequate level 05/28/2017 1957 - Progressing by Addison Naegeli I, RN Activity: Interest or engagement in activities will improve 05/28/2017 1957 - Progressing by Berkley Harvey, RN Education: Knowledge of [place] [facility] Education information/materials will improve 05/28/2017 1957 - Progressing by Addison Naegeli I, RN Safety: Ability to remain free from injury will improve 05/28/2017 1957 - Progressing by Berkley Harvey, RN

## 2017-05-29 MED ORDER — BENZTROPINE MESYLATE 0.5 MG PO TABS: 0.5000 mg | ORAL_TABLET | Freq: Two times a day (BID) | ORAL | 0 refills | Status: DC

## 2017-05-29 MED ORDER — AMITRIPTYLINE HCL 25 MG PO TABS: 25.0000 mg | ORAL_TABLET | Freq: Two times a day (BID) | ORAL | 0 refills | Status: DC

## 2017-05-29 MED ORDER — CIPROFLOXACIN HCL 500 MG PO TABS: 500.0000 mg | ORAL_TABLET | Freq: Two times a day (BID) | ORAL | 0 refills | Status: AC

## 2017-05-29 MED ORDER — THIOTHIXENE 5 MG PO CAPS: 5.0000 mg | ORAL_CAPSULE | Freq: Two times a day (BID) | ORAL | 0 refills | Status: DC

## 2017-05-29 MED ORDER — THIOTHIXENE 5 MG PO CAPS
5.0000 mg | ORAL_CAPSULE | Freq: Two times a day (BID) | ORAL | Status: DC
  Administered 2017-05-30: 5 mg via ORAL
  Filled 2017-05-29 (×3): qty 1

## 2017-05-29 NOTE — BHH Group Notes (Signed)
  05/29/2017  Time: 0930  Type of Therapy/Topic:  Group Therapy:  Emotion Regulation  Participation Level:  Active   Description of Group:    The purpose of this group is to assist patients in learning to regulate negative emotions and experience positive emotions. Patients will be guided to discuss ways in which they have been vulnerable to their negative emotions. These vulnerabilities will be juxtaposed with experiences of positive emotions or situations, and patients will be challenged to use positive emotions to combat negative ones. Special emphasis will be placed on coping with negative emotions in conflict situations, and patients will process healthy conflict resolution skills.  Therapeutic Goals: 1. Patient will identify two positive emotions or experiences to reflect on in order to balance out negative emotions 2. Patient will label two or more emotions that they find the most difficult to experience 3. Patient will demonstrate positive conflict resolution skills through discussion and/or role plays  Summary of Patient Progress: Pt continues to work towards their tx goals but has not yet reached them. Pt was able to participate in group discussion, and was able to offer support/validation to other group members. Pt needed redirection several times as she was speaking over other group members and over CSW. Pt was able to stay on topic with redirection, but often brought up things that did not relate to the group discussion. Pt reported two things she can do to stay safe/calm in the moment, and to regulate her emotions effectively are, "walking the dog and smoking a cigarette."    Therapeutic Modalities:   Cognitive Behavioral Therapy Feelings Identification Dialectical Behavioral Therapy   Heidi Dach, MSW, LCSW 05/29/2017 10:26 AM

## 2017-05-29 NOTE — Progress Notes (Signed)
Patient refused CBG. 

## 2017-05-29 NOTE — Progress Notes (Signed)
Erica Mueller Erica Mueller  MRN:  [id number]   Subjective:  Pt states that she is feeling okay today. She states that the "Zyprexa is not working." She states this but also denies all symptoms. She states that she wants back on the Navane and amitriptyline because this has worked for her for years. She is very focused on this. We discussed again about the sighs of tardive dyskinesia that appears she is having. She states that she does not care and would like to get back on this. She states that she has tried many other medications and that has worked the best for her. She is aware of the risks of this medications and would like to continue. She is organized and goal directed. She is irritable today. Denies SI or HI towards her granddaughter or others. She is wanting to discharge tomorrow.   Principal Problem: Schizophrenia (HCC) Diagnosis:   Patient Active Problem List   Diagnosis Date Noted  . Schizophrenia (HCC) [F20.9] 05/27/2017    Priority: High  . Schizoaffective disorder, depressive type (HCC) [F25.1] 04/02/2017  . Diabetes mellitus without complication (HCC) [E11.9] 04/02/2017  . Hypertension [I10] 04/02/2017  . Noncompliance [Z91.19] 04/02/2017   Total Time spent with patient: 20 minutes  Past Psychiatric History: See H&P  Past Medical History:  Past Medical History:  Diagnosis Date  . Coronary artery disease   . Depression   . Diabetes mellitus without complication (HCC)   . Elevated lipids   . GERD (gastroesophageal reflux disease)   . Hypertension   . Syphilis (acquired)     Past Surgical History:  Procedure Laterality Date  . COLONOSCOPY    . COLONOSCOPY WITH PROPOFOL N/A 09/01/2015   Procedure: COLONOSCOPY WITH PROPOFOL;  Surgeon: Wallace CullensPaul Y Oh, MD;  [facility];  Service: Gastroenterology;  Laterality: N/A;  . TUBAL LIGATION    . wisdom teeth removal     Family History: History reviewed. No pertinent family history. Family  Psychiatric  History: See H&P Social History:  Social History   Substance and Sexual Activity  Alcohol Use Yes   Comment: occassionally     Social History   Substance and Sexual Activity  Drug Use No    Social History   Socioeconomic History  . Marital status: Divorced    Spouse name: None  . Number of children: None  . Years of education: None  . Highest education level: None  Social Needs  . Financial resource strain: None  . Food insecurity - worry: None  . Food insecurity - inability: None  . Transportation needs - medical: None  . Transportation needs - non-medical: None  Occupational History  . None  Tobacco Use  . Smoking status: Current Every Day Smoker    Types: Cigarettes  . Smokeless tobacco: Never Used  Substance and Sexual Activity  . Alcohol use: Yes    Comment: occassionally  . Drug use: No  . Sexual activity: None  Other Topics Concern  . None  Social History Narrative  . None   Additional Social History:                         Sleep: Good  Appetite:  Good  Current Medications: Current Facility-Administered Medications  Medication Dose Route Frequency Provider Last Rate Last Dose  . acetaminophen (TYLENOL) tablet 650 mg  650 mg Oral Q6H PRN Clapacs, Jackquline DenmarkJohn T, MD   650 mg at 05/29/17  0118  . alum & mag hydroxide-simeth (MAALOX/MYLANTA) 200-200-20 MG/5ML suspension 30 mL  30 mL Oral Q4H PRN Clapacs, John T, MD      . amitriptyline (ELAVIL) tablet 25 mg  25 mg Oral QHS Clapacs, Jackquline Denmark, MD   25 mg at 05/28/17 2101  . benztropine (COGENTIN) tablet 0.5 mg  0.5 mg Oral BID Clapacs, John T, MD   0.5 mg at 05/29/17 0900  . ciprofloxacin (CIPRO) tablet 500 mg  500 mg Oral BID Clapacs, John T, MD   500 mg at 05/29/17 0900  . linagliptin (TRADJENTA) tablet 5 mg  5 mg Oral Daily Clapacs, John T, MD   5 mg at 05/29/17 0900  . magnesium hydroxide (MILK OF MAGNESIA) suspension 30 mL  30 mL Oral Daily PRN Clapacs, John T, MD      . metFORMIN  (GLUCOPHAGE) tablet 500 mg  500 mg Oral BID WC Clapacs, John T, MD   500 mg at 05/29/17 0900  . nicotine (NICODERM CQ - dosed in mg/24 hours) patch 21 mg  21 mg Transdermal Daily Meiling Hendriks, Ileene Hutchinson, MD   21 mg at 05/29/17 0900  . pioglitazone (ACTOS) tablet 15 mg  15 mg Oral Daily Clapacs, John T, MD   15 mg at 05/29/17 0900  . pravastatin (PRAVACHOL) tablet 20 mg  20 mg Oral q1800 Clapacs, Jackquline Denmark, MD   20 mg at 05/28/17 1742  . thiothixene (NAVANE) capsule 5 mg  5 mg Oral BID Gertude Benito, Ileene Hutchinson, MD      . traZODone (DESYREL) tablet 100 mg  100 mg Oral QHS PRN Clapacs, Jackquline Denmark, MD        Lab Results:  Results for orders placed or performed during the hospital encounter of 05/27/17 (from the past 48 hour(s))  Glucose, capillary     Status: Abnormal   Collection Time: 05/27/17 10:20 Mueller  Result Value Ref Range   Glucose-Capillary 208 (H) 65 - 99 mg/dL  Glucose, capillary     Status: Abnormal   Collection Time: 05/28/17  8:42 AM  Result Value Ref Range   Glucose-Capillary 326 (H) 65 - 99 mg/dL  Glucose, capillary     Status: Abnormal   Collection Time: 05/28/17  5:47 Mueller  Result Value Ref Range   Glucose-Capillary 205 (H) 65 - 99 mg/dL    Blood Alcohol level:  Lab Results  Component Value Date   ETH <10 05/26/2017   ETH <10 04/01/2017    Metabolic Disorder Labs: Lab Results  Component Value Date   HGBA1C 6.6 (H) 05/27/2017   MPG 143 05/27/2017   No results found for: PROLACTIN Lab Results  Component Value Date   CHOL 130 05/27/2017   TRIG 56 05/27/2017   HDL 57 05/27/2017   CHOLHDL 2.3 05/27/2017   VLDL 11 05/27/2017   LDLCALC 62 05/27/2017    Physical Findings: AIMS: Facial and Oral Movements Muscles of Facial Expression: Mild Lips and Perioral Area: Severe Jaw: Moderate Tongue: Severe,Extremity Movements Upper (arms, wrists, hands, fingers): None, normal Lower (legs, knees, ankles, toes): None, normal, Trunk Movements Neck, shoulders, hips: None, normal, Overall  Severity Severity of abnormal movements (highest score from questions above): Moderate Incapacitation due to abnormal movements: Minimal Patient's awareness of abnormal movements (rate only patient's report): Aware, mild distress, Dental Status Current problems with teeth and/or dentures?: Yes Does patient usually wear dentures?: No  CIWA:  CIWA-Ar Total: 0 COWS:  COWS Total Score: 1  Musculoskeletal: Strength & Muscle Tone: within normal  limits Gait & Station: normal Patient leans: N/A  Psychiatric Specialty Exam: Physical Exam  Nursing note and vitals reviewed.   Review of Systems  All other systems reviewed and are negative.   Blood pressure (!) 181/65, pulse 94, temperature 98 F (36.7 C), temperature source Oral, resp. rate 16, height 5\' 2"  (1.575 m), weight 64.4 kg (142 lb), SpO2 100 %.Body mass index is 25.97 kg/m.  General Appearance: Disheveled  Eye Contact:  Good  Speech:  Clear and Coherent  Volume:  Normal  Mood:  Irritable  Affect:  Congruent  Thought Process:  Goal Directed  Orientation:  Full (Time, Place, and Person)  Thought Content:  Negative  Suicidal Thoughts:  No  Homicidal Thoughts:  No  Memory:  Immediate;   Fair  Judgement:  Fair  Insight:  Fair  Psychomotor Activity:  Normal  Concentration:  Concentration: Fair  Recall:  [employer] of Knowledge:  Fair  Language:  Fair  Akathisia:  No      Assets:  Communication Skills Desire for Improvement Resilience  ADL's:  Intact  Cognition:  WNL  Sleep:  Number of Hours: 5     Treatment Plan Summary: 66 yo female admitted due to homicidal statements towards her granddaughter. Pt is adamant that she wants to get back on Navane and is insistent that it is the only medication that works. She has noticeable TD in her oral area. She was willing to try Zyprexa but states that it does not work. However, she denies all symptoms including hallucinations. We discussed risks of continuing Navane including  worsen TD and muscle movements but she wants to continue it and states that it does not bother her.   Plan:  Schizophrenia -D/C Zyprexa -Start Navane 5 mg BID -Cogentin 0.5 mg BID  Mood -Continue amitriptyline 25 mg qhs  DM -Continue current regimen  Dispo -Plan to discharge tomorrow back to her home  Haskell Riling, MD 05/29/2017, 3:04 Mueller

## 2017-05-29 NOTE — Progress Notes (Signed)
D- Patient alert and oriented. Patient presents in a pleasant mood on assessment. Patient reports that she slept good last night. Patient states "I just want to go home". Patient denies SI, HI, AVH, and pain at this time. Patient reports that she is not depressed nor anxious at this time. Patient states "I want a cigarette" so this writer administered patient her nicotine patch during morning med pass. This patient is worried that she is not getting her correct medications, she asks this Clinical research associate to "[facility] and ask for Dr. Greer Ee to see what meds I am on".   A- Scheduled medications administered to patient, per MD orders. Support and encouragement provided.  Routine safety checks conducted every 15 minutes.  Patient informed to notify staff with problems or concerns.  R- No adverse drug reactions noted. Patient contracts for safety at this time. Patient compliant with medications and treatment plan. Patient receptive, calm, and cooperative. Patient interacts well with others on the unit.  Patient remains safe at this time.

## 2017-05-29 NOTE — Progress Notes (Signed)
Patient refused CBG stating "I don't think I need that".

## 2017-05-29 NOTE — Plan of Care (Signed)
Patient reports that she slept well last night "all I remember was falling out and that was it". Patient has been observed engaging in unit activities and interacting well with others on the unit. Patient verbalizes understanding of general information that has been provided to her. Patient denies SI/HI/AVH as well as any depression/anxiety at this time. Patient has been in compliance with medication/therapeutic regimen thus far on the unit. Patient has been free from injury on the unit and has the ability to cope with whatever issues may arise. Patient has asked staff for the supplies in order for her to participate in her self-care needs. Patient is working to maintain her clinical measurements within normal limits. Patient is safe on the unit at this time.

## 2017-05-29 NOTE — BHH Group Notes (Signed)
B[facility] Group Notes:  (Nursing/MHT/Case Management/Adjunct)  Date:  05/29/2017  Time:  6:02 PM  Type of Therapy:  Psychoeducational Skills  Participation Level:  Active  Participation Quality:  Appropriate and Attentive  Affect:  Appropriate  Cognitive:  Appropriate  Insight:  Appropriate and Good  Engagement in Group:  Engaged  Modes of Intervention:  Discussion, Education and Exploration  Summary of Progress/Problems:  Judene Companion 05/29/2017, 6:02 PM

## 2017-05-29 NOTE — BHH Group Notes (Signed)
[facility] Group Notes:  (Nursing/MHT/Case Management/Adjunct)  Date:  05/29/2017  Time:  9:57 PM  Type of Therapy:  Group Therapy  Participation Level:  Active  Participation Quality:  Sharing  Affect:  Appropriate  Cognitive:  Appropriate  Insight:  Appropriate  Engagement in Group:  Engaged  Modes of Intervention:  Discussion  Summary of Progress/Problems:  Erica Mueller 05/29/2017, 9:57 PM

## 2017-05-29 NOTE — Progress Notes (Signed)
Recreation Therapy Notes  Date: 12.19.2018  Time: 1:00pm   Location: Craft Room  Behavioral response: Appropriate, Need redirection  Intervention Topic: Self-esteem  Discussion/Intervention: Group content today was focused on self-esteem. Patient defined self-esteem and where it comes form. The group described reasons self-esteem is important. Individuals stated things that impact self-esteem and positive ways to improve self-esteem. The group participated in the intervention "Collage of Me" where patients were able to create a collage of positive things that makes them who they are. Clinical Observations/Feedback:  Patient came to group and stated self-esteem is the way you feel. She became negative about group during the intervention and was redirected by staff. Individual did not participate in the intervention during group.   Landrey Mahurin LRT/CTRS         Jasan Doughtie 05/29/2017 2:43 PM

## 2017-05-29 NOTE — Progress Notes (Signed)
Patient ID: Erica Mueller, female   DOB: 1951/04/13, 66 y.o.   MRN: [id number] CSW met with Pt and APS worker Marga Melnick from [facility].  She came to discuss Pt treatment assistance options if she would like to take advantage of those.  CSW did not provide information about Pt condition but allowed her to meet with the Pt to discuss next actions.  Dossie Arbour, LCSW
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
## 2017-05-30 NOTE — BHH Group Notes (Signed)
05/30/2017  Time: 1:00PM  Type of Therapy/Topic:  Group Therapy:  Balance in Life  Participation Level:  Active  Description of Group:   This group will address the concept of balance and how it feels and looks when one is unbalanced. Patients will be encouraged to process areas in their lives that are out of balance and identify reasons for remaining unbalanced. Facilitators will guide patients in utilizing problem-solving interventions to address and correct the stressor making their life unbalanced. Understanding and applying boundaries will be explored and addressed for obtaining and maintaining a balanced life. Patients will be encouraged to explore ways to assertively make their unbalanced needs known to significant others in their lives, using other group members and facilitator for support and feedback.  Therapeutic Goals: 1. Patient will identify two or more emotions or situations they have that consume much of in their lives. 2. Patient will identify signs/triggers that life has become out of balance:  3. Patient will identify two ways to set boundaries in order to achieve balance in their lives:  4. Patient will demonstrate ability to communicate their needs through discussion and/or role plays  Summary of Patient Progress: Pt continues to work towards their tx goals but has not yet reached them. Pt was able to appropriately participate in group discussion, and was able to offer support/validation to other group members. Pt reported two areas of her life that she devotes too much attention to are, "financial and family." Pt reported one area of her life she'd like to devote more attention to is, "creative/artistic."    Therapeutic Modalities:   Cognitive Behavioral Therapy Solution-Focused Therapy Assertiveness Training  Heidi Dach, MSW, LCSW 05/30/2017 1:56 PM

## 2017-05-30 NOTE — Progress Notes (Signed)
Received Erica Mueller this am, she was escorted to breakfast,then she received her medications. She denied all of the psychiatric symptoms and feels ready for discharge home today. She received her discharge order, The AVS was reviewed and her questions were answered. Her personal belonging, medication and valuables returned.She was discharged without incident with her brother.

## 2017-05-30 NOTE — Progress Notes (Signed)
Recreation Therapy Notes  INPATIENT RECREATION TR PLAN  Patient Details Name: Erica Mueller MRN: [id number] DOB: 03-04-1951 Today's Date: 05/30/2017  Rec Therapy Plan Is patient appropriate for Therapeutic Recreation?: Yes Treatment times per week: at least 3 Estimated Length of Stay: 5-7 days TR Treatment/Interventions: Group participation (Comment)  Discharge Criteria Pt will be discharged from therapy if:: Discharged Treatment plan/goals/alternatives discussed and agreed upon by:: Patient/family  Discharge Summary Short term goals set: Patient will attend and participate in Recreation Therapy Group Sessions x5 days  Short term goals met: Adequate for discharge Progress toward goals comments: Groups attended Which groups?: Self-esteem Reason goals not met: N/A Therapeutic equipment acquired: N/A Reason patient discharged from therapy: Discharge from hospital Pt/family agrees with progress & goals achieved: Yes Date patient discharged from therapy: 05/30/17   Nathaneal Sommers 05/30/2017, 12:48 PM
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
## 2017-05-30 NOTE — BHH Group Notes (Signed)
LCSW Group Therapy Note 05/30/2017 9:00AM  Type of Therapy and Topic:  Group Therapy:  Setting Goals  Participation Level:  Minimal  Description of Group: In this process group, patients discussed using strengths to work toward goals and address challenges.  Patients identified two positive things about themselves and one goal they were working on.  Patients were given the opportunity to share openly and support each other's plan for self-empowerment.  The group discussed the value of gratitude and were encouraged to have a daily reflection of positive characteristics or circumstances.  Patients were encouraged to identify a plan to utilize their strengths to work on current challenges and goals.  Therapeutic Goals 1. Patient will verbalize personal strengths/positive qualities and relate how these can assist with achieving desired personal goals 2. Patients will verbalize affirmation of peers plans for personal change and goal setting 3. Patients will explore the value of gratitude and positive focus as related to successful achievement of goals 4. Patients will verbalize a plan for regular reinforcement of personal positive qualities and circumstances.  Summary of Patient Progress:  Erica Mueller was able to participate some in today's process group.  Erica Mueller did not seem to have a good understanding of how to create SMART groups, but would able to identify her goal for today as "to be discharged from the hospital today."     Therapeutic Modalities Cognitive Behavioral Therapy Motivational Interviewing    Alease Frame, LCSW 05/30/2017 11:34 AM

## 2017-05-30 NOTE — Progress Notes (Signed)
  [facility] Adult Case Management Discharge Plan :  Will you be returning to the same living situation after discharge:  Yes,  Pt will be going back to her home At discharge, do you have transportation home?: Yes,  Pt's brother will be picking her up. Do you have the ability to pay for your medications: Yes,  Pt receives social security benefits and has medical insurance.  Release of information consent forms completed and in the chart;  Patient's signature needed at discharge.  Patient to Follow up at: Follow-up Information    Pc, [facility]. Go on 06/07/2017.   Why:  Walk in for hospital follow up appointment Monday- Friday 9am-4pm.  Please arrive early to minimize wait times. Contact information: [address] [place] [address] [phone]           Next level of care provider has access to [facility] Link:no  Safety Planning and Suicide Prevention discussed: Yes,  No safety concerns expressed by pt.  Have you used any form of tobacco in the last 30 days? (Cigarettes, Smokeless Tobacco, Cigars, and/or Pipes): Yes  Has patient been referred to the Quitline?: Patient refused referral  Patient has been referred for addiction treatment: N/A  Alease Frame, LCSW 05/30/2017, 10:47 AM

## 2017-05-30 NOTE — Progress Notes (Signed)
Patient has been interacting well in the unit and pleasant. Cooperating with her medical regimen, active participation in groups, appetite is good consuming more than 75% of her meals and well hydrated with fluids and juices. Patient denies any thoughts of suicide and ideation at this time no distress noted, sleep moderately 4-6 hours.

## 2017-05-30 NOTE — BHH Suicide Risk Assessment (Signed)
[facility] Discharge Suicide Risk Assessment   Principal Problem: Schizophrenia [facility]) Discharge Diagnoses:  Patient Active Problem List   Diagnosis Date Noted  . Schizophrenia (HCC) [F20.9] 05/27/2017    Priority: High  . Schizoaffective disorder, depressive type (HCC) [F25.1] 04/02/2017  . Diabetes mellitus without complication (HCC) [E11.9] 04/02/2017  . Hypertension [I10] 04/02/2017  . Noncompliance [Z91.19] 04/02/2017    Total Time spent with patient: 20 minutes  Mental Status Per Nursing Assessment::   On Admission:     Demographic Factors:  Age 66 or older and Unemployed  Loss Factors: NA  Historical Factors: Impulsivity  Risk Reduction Factors:   Positive social support, Positive therapeutic relationship and Positive coping skills or problem solving skills  Continued Clinical Symptoms:  None  Cognitive Features That Contribute To Risk:  None    Suicide Risk:  Minimal: No identifiable suicidal ideation.  Patients presenting with no risk factors but with morbid ruminations; may be classified as minimal risk based on the severity of the depressive symptoms  Follow-up Information    Pc, [facility]. Go on 06/07/2017.   Why:  Walk in for hospital follow up appointment Monday- Friday 9am-4pm.  Please arrive early to minimize wait times. Contact information: [address] [place] [address] [phone]           Plan Of Care/Follow-up recommendations:  Follow up with Alessandra Bevels, MD 05/30/2017, 8:20 AM

## 2017-05-30 NOTE — Progress Notes (Signed)
Recreation Therapy Notes  Date: 12.20.2018  Time: 9:30 am  Location: Craft Room  Behavioral response: N/A  Intervention Topic: Time Management  Discussion/Intervention: Patient did not attend group. Clinical Observations/Feedback:  Patient did not attend group. Sampson Self LRT/CTRS         Bach Rocchi 05/30/2017 12:40 PM

## 2017-07-30 DIAGNOSIS — Z8659 Personal history of other mental and behavioral disorders: Secondary | ICD-10-CM | POA: Insufficient documentation

## 2017-08-04 ENCOUNTER — Encounter: Payer: Self-pay | Admitting: Emergency Medicine

## 2017-08-04 ENCOUNTER — Emergency Department
Admission: EM | Admit: 2017-08-04 | Discharge: 2017-08-04 | Disposition: A | Discharge status: Home / Self Care | Payer: Medicare Other | Attending: Emergency Medicine | Admitting: Emergency Medicine

## 2017-08-04 ENCOUNTER — Other Ambulatory Visit: Payer: Self-pay

## 2017-08-04 DIAGNOSIS — Z7984 Long term (current) use of oral hypoglycemic drugs: Secondary | ICD-10-CM | POA: Diagnosis not present

## 2017-08-04 DIAGNOSIS — F17210 Nicotine dependence, cigarettes, uncomplicated: Secondary | ICD-10-CM | POA: Diagnosis not present

## 2017-08-04 DIAGNOSIS — I2510 Atherosclerotic heart disease of native coronary artery without angina pectoris: Secondary | ICD-10-CM | POA: Insufficient documentation

## 2017-08-04 DIAGNOSIS — Z79899 Other long term (current) drug therapy: Secondary | ICD-10-CM | POA: Insufficient documentation

## 2017-08-04 DIAGNOSIS — Z0000 Encounter for general adult medical examination without abnormal findings: Secondary | ICD-10-CM

## 2017-08-04 DIAGNOSIS — R531 Weakness: Secondary | ICD-10-CM | POA: Insufficient documentation

## 2017-08-04 DIAGNOSIS — Z760 Encounter for issue of repeat prescription: Secondary | ICD-10-CM | POA: Diagnosis not present

## 2017-08-04 DIAGNOSIS — E119 Type 2 diabetes mellitus without complications: Secondary | ICD-10-CM | POA: Insufficient documentation

## 2017-08-04 DIAGNOSIS — I10 Essential (primary) hypertension: Secondary | ICD-10-CM | POA: Diagnosis not present

## 2017-08-04 LAB — BASIC METABOLIC PANEL
ANION GAP: 9 (ref 5–15)
BUN: 7 mg/dL (ref 6–20)
CO2: 27 mmol/L (ref 22–32)
Calcium: 9.6 mg/dL (ref 8.9–10.3)
Chloride: 102 mmol/L (ref 101–111)
Creatinine, Ser: 0.76 mg/dL (ref 0.44–1.00)
GFR calc Af Amer: >60 mL/min (ref >60)
Glucose, Bld: 190 mg/dL — ABNORMAL HIGH (ref 65–99)
POTASSIUM: 3.6 mmol/L (ref 3.5–5.1)
SODIUM: 138 mmol/L (ref 135–145)

## 2017-08-04 LAB — CBC
HEMATOCRIT: 40.1 % (ref 35.0–47.0)
HEMOGLOBIN: 13 g/dL (ref 12.0–16.0)
MCH: 29.1 pg (ref 26.0–34.0)
MCHC: 32.6 g/dL (ref 32.0–36.0)
MCV: 89.2 fL (ref 80.0–100.0)
Platelets: 204 10*3/uL (ref 150–440)
RBC: 4.49 MIL/uL (ref 3.80–5.20)
RDW: 13.5 % (ref 11.5–14.5)
WBC: 6.1 10*3/uL (ref 3.6–11.0)

## 2017-08-04 LAB — TROPONIN I: Troponin I: <0.03 ng/mL (ref <0.03)

## 2017-08-04 MED ORDER — BENZTROPINE MESYLATE 0.5 MG PO TABS: 0.5000 mg | ORAL_TABLET | Freq: Two times a day (BID) | ORAL | 0 refills | Status: DC

## 2017-08-04 MED ORDER — THIOTHIXENE 5 MG PO CAPS: 5.0000 mg | ORAL_CAPSULE | Freq: Two times a day (BID) | ORAL | 0 refills | Status: DC

## 2017-08-04 NOTE — ED Provider Notes (Addendum)
[facility] Emergency Department Provider Note  Time seen: 6:38 PM  I have reviewed the triage vital signs and the nursing notes.   HISTORY  Chief Complaint Weakness    HPI Erica Mueller is a 67 y.o. female with a past medical history of CAD, depression, diabetes, gastric reflux, hypertension, schizophrenia, presents to the emergency department for evaluation.  According to the patient she was talking to her sister who insisted that the patient go to the hospital tonight for evaluation.  Here the patient states she does not need to be evaluated, she feels well and she wishes to go home.  Patient is able to provide a good history, but did say the year was 1919 instead of 2019.  Patient denies any medical complaints today.  States she is ready to go home.  Patient appears to have a form of tardive dyskinesia, with frequent tongue rolling.  She states that is chronic due to her medications.   Past Medical History:  Diagnosis Date  . Coronary artery disease   . Depression   . Diabetes mellitus without complication (HCC)   . Elevated lipids   . GERD (gastroesophageal reflux disease)   . Hypertension   . Syphilis (acquired)     Patient Active Problem List   Diagnosis Date Noted  . Schizophrenia (HCC) 05/27/2017  . Schizoaffective disorder, depressive type (HCC) 04/02/2017  . Diabetes mellitus without complication (HCC) 04/02/2017  . Hypertension 04/02/2017  . Noncompliance 04/02/2017    Past Surgical History:  Procedure Laterality Date  . COLONOSCOPY    . COLONOSCOPY WITH PROPOFOL N/A 09/01/2015   Procedure: COLONOSCOPY WITH PROPOFOL;  Surgeon: Wallace Cullens, MD;  Location: [facility] ENDOSCOPY;  Service: Gastroenterology;  Laterality: N/A;  . TUBAL LIGATION    . wisdom teeth removal      Prior to Admission medications   Medication Sig Start Date End Date Taking? Authorizing Provider  amitriptyline (ELAVIL) 25 MG tablet Take 1 tablet (25 mg total) by mouth 2  (two) times daily. 05/29/17   McNew, Ileene Hutchinson, MD  benztropine (COGENTIN) 0.5 MG tablet Take 1 tablet (0.5 mg total) by mouth 2 (two) times daily. 05/29/17   McNew, Ileene Hutchinson, MD  lovastatin (MEVACOR) 40 MG tablet Take 40 mg by mouth at bedtime.    [provider]  metFORMIN (GLUCOPHAGE) 500 MG tablet Take by mouth 2 (two) times daily with a meal.    [provider]  pioglitazone (ACTOS) 15 MG tablet Take 15 mg by mouth daily.    [provider]  sitaGLIPtin (JANUVIA) 100 MG tablet Take 100 mg by mouth daily.    [provider]  thiothixene (NAVANE) 5 MG capsule Take 1 capsule (5 mg total) by mouth 2 (two) times daily. 05/29/17   McNew, Ileene Hutchinson, MD    No Known Allergies  History reviewed. No pertinent family history.  Social History Social History   Tobacco Use  . Smoking status: Current Every Day Smoker    Types: Cigarettes  . Smokeless tobacco: Never Used  Substance Use Topics  . Alcohol use: Yes    Comment: occassionally  . Drug use: No    Review of Systems Constitutional: Negative for fever. Eyes: Negative for visual complaints ENT: Negative for recent illness/congestion Cardiovascular: Negative for chest pain. Respiratory: Negative for shortness of breath. Gastrointestinal: Negative for abdominal pain, vomiting Genitourinary: Negative for urinary compaints Musculoskeletal: Negative for musculoskeletal complaints Skin: Negative for skin complaints  Neurological: Negative for headache All  other ROS negative  ____________________________________________   PHYSICAL EXAM:  VITAL SIGNS: ED Triage Vitals [08/04/17 1646]  Enc Vitals Group     BP (!) 162/87     Pulse Rate 98     Resp 16     Temp 98.6 F (37 C)     Temp Source Oral     SpO2 98 %     Weight 140 lb (63.5 kg)     Height 5\' 2"  (1.575 m)     Head Circumference      Peak Flow      Pain Score      Pain Loc      Pain Edu?      Excl. in GC?    Constitutional: Alert and  mostly oriented.  No distress.  Initially sitting on the bed, then walks around the room, no distress, no weakness Eyes: Normal exam ENT   Head: Normocephalic and atraumatic.   Nose: No congestion/rhinnorhea.   Mouth/Throat: Mucous membranes are moist.  Frequent lip smacking/tongue rolling movements which she states are due to her medication and chronic Cardiovascular: Normal rate, regular rhythm. No murmur Respiratory: Normal respiratory effort without tachypnea nor retractions. Breath sounds are clear  Gastrointestinal: Soft and nontender. No distention.   Musculoskeletal: Nontender with normal range of motion in all extremities. No lower extremity tenderness  Neurologic:  Normal speech and language. No gross focal neurologic deficits  Skin:  Skin is warm, dry and intact.  Psychiatric: Mood and affect are normal.   ____________________________________________    EKG  EKG reviewed and interpreted by myself shows normal sinus rhythm at 93 bpm with a narrow QRS, normal axis, largely normal intervals besides slightly prolonged QTC, nonspecific ST changes.  Electrical interference on EKG. ____________________________________________    INITIAL IMPRESSION / ASSESSMENT AND PLAN / ED COURSE  Pertinent labs & imaging results that were available during my care of the patient were reviewed by me and considered in my medical decision making (see chart for details).  Patient presents to the emergency department for evaluation.  Patient denies any medical concerns.  States she was talking to her sister and her sister made a call EMS.  I talked to the patient's brother as well as the patient's sister Eber Jones.  The sister states the patient has a history of psychiatric illness was seen at [facility] on Monday, I reviewed this note they wish to admit the patient to the emergency psychiatric unit for some bizarre behaviors however the patient did not meet IVC criteria and ultimately decided to go  home.  Sister states she is concerned because every time they try to make a doctor's appointment the patient ultimately refuses to go I will not follow-up.  Sister also states she is out of some of her medications but does not know which ones.  I discussed this with the patient, she says she does not want to be evaluated by psychiatrist.  She denies any SI or HI.  Patient has what appears to be tardive dyskinesia with lip smacking but she states this is chronic due to her medications.  Patient is prescribed Cogentin which she states she is out of.  At this time the patient does not meet IVC criteria.  Family is refusing to come pick the patient up with the patient states she wishes to leave the emergency department anyways.  Again she does not meet IVC criteria so I do not feel that I can forcibly keep the patient in the emergency department.  She states she has money to pay for a taxi, is able to provide myself with an address clearly.  Patient has provided a good history throughout her examination.  I went through the patient's medication list with the patient she was able to tell me that she is out of her Cogentin as well as Navane medications.  I will write the patient prescription for these.  I offered to have her seen by psychiatry, she has refused and is once again asking to be discharged home.  ____________________________________________   FINAL CLINICAL IMPRESSION(S) / ED DIAGNOSES  Medical evaluation    Minna Antis, MD 08/04/17 Clarisa Fling    Minna Antis, MD 08/04/17 [phone]

## 2017-08-04 NOTE — ED Triage Notes (Signed)
Pt c/o not feeling well and has been weak.  At home with family currently.  Pt wants to be placed in a facility.  No pain.  Has been weak for "a while".  Disoriented to year.  Pt here via EMS. VSS. NAD

## 2017-08-04 NOTE — ED Notes (Signed)
FN: pt brought in by ems from home with reports of not feeling well. Ems reports all vss, cbg 226. Pt lives with other family. Pt not able to take care of herself and wants to find help with placement, feels like family is not taking care of her.

## 2017-08-04 NOTE — Discharge Instructions (Signed)
You have been seen in the emergency department for medical evaluation.  You have received 2 prescription medication refills.  Please take these medications as prescribed.  Return to the emergency department for any personal concerning symptoms, or if you have thoughts of hurting herself or anyone else that we may help you.

## 2017-08-04 NOTE — ED Notes (Signed)
Pt states she does not know why she's here.  Pt states she wants to go home and does not want to be here.  Pt is A&Ox4.  Pt in NAD at this time.

## 2017-08-04 NOTE — ED Notes (Signed)
Pt discharged to home.  Pt to get cab to return her home.  EDP aware and okay with this.  Discharge instructions reviewed.  Verbalized understanding.  No questions or concerns at this time.  Teach back verified.  Pt in NAD.  No items left in ED.

## 2017-09-16 ENCOUNTER — Encounter: Payer: Self-pay | Admitting: Emergency Medicine

## 2017-09-16 ENCOUNTER — Emergency Department
Admission: EM | Admit: 2017-09-16 | Discharge: 2017-09-16 | Disposition: A | Discharge status: Home / Self Care | Payer: Medicare Other | Attending: Student in an Organized Health Care Education/Training Program | Admitting: Student in an Organized Health Care Education/Training Program

## 2017-09-16 ENCOUNTER — Emergency Department: Payer: Medicare Other

## 2017-09-16 DIAGNOSIS — F17210 Nicotine dependence, cigarettes, uncomplicated: Secondary | ICD-10-CM | POA: Insufficient documentation

## 2017-09-16 DIAGNOSIS — Z79899 Other long term (current) drug therapy: Secondary | ICD-10-CM | POA: Insufficient documentation

## 2017-09-16 DIAGNOSIS — I2510 Atherosclerotic heart disease of native coronary artery without angina pectoris: Secondary | ICD-10-CM | POA: Insufficient documentation

## 2017-09-16 DIAGNOSIS — E119 Type 2 diabetes mellitus without complications: Secondary | ICD-10-CM | POA: Insufficient documentation

## 2017-09-16 DIAGNOSIS — I10 Essential (primary) hypertension: Secondary | ICD-10-CM | POA: Diagnosis not present

## 2017-09-16 DIAGNOSIS — M542 Cervicalgia: Secondary | ICD-10-CM

## 2017-09-16 LAB — BASIC METABOLIC PANEL
ANION GAP: 6 (ref 5–15)
BUN: 7 mg/dL (ref 6–20)
CALCIUM: 9.3 mg/dL (ref 8.9–10.3)
CHLORIDE: 104 mmol/L (ref 101–111)
CO2: 29 mmol/L (ref 22–32)
CREATININE: 0.72 mg/dL (ref 0.44–1.00)
GFR calc non Af Amer: >60 mL/min (ref >60)
Glucose, Bld: 213 mg/dL — ABNORMAL HIGH (ref 65–99)
Potassium: 3.5 mmol/L (ref 3.5–5.1)
SODIUM: 139 mmol/L (ref 135–145)

## 2017-09-16 MED ORDER — IOHEXOL 350 MG/ML SOLN
75.0000 mL | Freq: Once | INTRAVENOUS | Status: AC | PRN
  Administered 2017-09-16: 75 mL via INTRAVENOUS

## 2017-09-16 NOTE — ED Triage Notes (Signed)
Updated about wait.
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
## 2017-09-16 NOTE — ED Notes (Signed)
Adult services [employer].
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
## 2017-09-16 NOTE — ED Notes (Signed)
Pt states her son is on his way to pick her up.
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
## 2017-09-29 ENCOUNTER — Emergency Department
Admission: EM | Admit: 2017-09-29 | Discharge: 2017-09-30 | Disposition: A | Discharge status: Home / Self Care | Payer: Medicare Other | Attending: Emergency Medicine | Admitting: Emergency Medicine

## 2017-09-29 ENCOUNTER — Other Ambulatory Visit: Payer: Self-pay

## 2017-09-29 ENCOUNTER — Encounter: Payer: Self-pay | Admitting: Emergency Medicine

## 2017-09-29 DIAGNOSIS — Z7984 Long term (current) use of oral hypoglycemic drugs: Secondary | ICD-10-CM | POA: Diagnosis not present

## 2017-09-29 DIAGNOSIS — I10 Essential (primary) hypertension: Secondary | ICD-10-CM | POA: Insufficient documentation

## 2017-09-29 DIAGNOSIS — F209 Schizophrenia, unspecified: Secondary | ICD-10-CM | POA: Diagnosis present

## 2017-09-29 DIAGNOSIS — F17210 Nicotine dependence, cigarettes, uncomplicated: Secondary | ICD-10-CM | POA: Diagnosis not present

## 2017-09-29 DIAGNOSIS — E1169 Type 2 diabetes mellitus with other specified complication: Secondary | ICD-10-CM

## 2017-09-29 DIAGNOSIS — Z91199 Patient's noncompliance with other medical treatment and regimen due to unspecified reason: Secondary | ICD-10-CM

## 2017-09-29 DIAGNOSIS — I2510 Atherosclerotic heart disease of native coronary artery without angina pectoris: Secondary | ICD-10-CM | POA: Insufficient documentation

## 2017-09-29 DIAGNOSIS — E119 Type 2 diabetes mellitus without complications: Secondary | ICD-10-CM | POA: Insufficient documentation

## 2017-09-29 DIAGNOSIS — F200 Paranoid schizophrenia: Secondary | ICD-10-CM | POA: Diagnosis not present

## 2017-09-29 DIAGNOSIS — F23 Brief psychotic disorder: Secondary | ICD-10-CM | POA: Diagnosis present

## 2017-09-29 DIAGNOSIS — F203 Undifferentiated schizophrenia: Secondary | ICD-10-CM | POA: Diagnosis not present

## 2017-09-29 DIAGNOSIS — Z9119 Patient's noncompliance with other medical treatment and regimen: Secondary | ICD-10-CM | POA: Diagnosis not present

## 2017-09-29 DIAGNOSIS — F329 Major depressive disorder, single episode, unspecified: Secondary | ICD-10-CM | POA: Insufficient documentation

## 2017-09-29 DIAGNOSIS — Z79899 Other long term (current) drug therapy: Secondary | ICD-10-CM | POA: Diagnosis not present

## 2017-09-29 DIAGNOSIS — E11649 Type 2 diabetes mellitus with hypoglycemia without coma: Secondary | ICD-10-CM

## 2017-09-29 DIAGNOSIS — E1165 Type 2 diabetes mellitus with hyperglycemia: Secondary | ICD-10-CM

## 2017-09-29 LAB — URINE DRUG SCREEN, QUALITATIVE (ARMC ONLY)
Amphetamines, Ur Screen: NOT DETECTED
BARBITURATES, UR SCREEN: NOT DETECTED
Benzodiazepine, Ur Scrn: NOT DETECTED
CANNABINOID 50 NG, UR ~~LOC~~: NOT DETECTED
COCAINE METABOLITE, UR ~~LOC~~: NOT DETECTED
MDMA (Ecstasy)Ur Screen: NOT DETECTED
METHADONE SCREEN, URINE: NOT DETECTED
OPIATE, UR SCREEN: NOT DETECTED
Phencyclidine (PCP) Ur S: NOT DETECTED
Tricyclic, Ur Screen: NOT DETECTED

## 2017-09-29 LAB — CBC
HEMATOCRIT: 40.6 % (ref 35.0–47.0)
HEMOGLOBIN: 13.1 g/dL (ref 12.0–16.0)
MCH: 29.7 pg (ref 26.0–34.0)
MCHC: 32.3 g/dL (ref 32.0–36.0)
MCV: 91.8 fL (ref 80.0–100.0)
Platelets: 218 10*3/uL (ref 150–440)
RBC: 4.43 MIL/uL (ref 3.80–5.20)
RDW: 14.5 % (ref 11.5–14.5)
WBC: 6 10*3/uL (ref 3.6–11.0)

## 2017-09-29 LAB — COMPREHENSIVE METABOLIC PANEL
ALBUMIN: 4 g/dL (ref 3.5–5.0)
ALK PHOS: 54 U/L (ref 38–126)
ALT: 14 U/L (ref 14–54)
AST: 20 U/L (ref 15–41)
Anion gap: 5 (ref 5–15)
BUN: 7 mg/dL (ref 6–20)
CALCIUM: 9.5 mg/dL (ref 8.9–10.3)
CO2: 31 mmol/L (ref 22–32)
CREATININE: 0.73 mg/dL (ref 0.44–1.00)
Chloride: 106 mmol/L (ref 101–111)
GFR calc Af Amer: >60 mL/min (ref >60)
GFR calc non Af Amer: >60 mL/min (ref >60)
GLUCOSE: 309 mg/dL — AB (ref 65–99)
Potassium: 3.9 mmol/L (ref 3.5–5.1)
SODIUM: 142 mmol/L (ref 135–145)
Total Bilirubin: 0.4 mg/dL (ref 0.3–1.2)
Total Protein: 8.2 g/dL — ABNORMAL HIGH (ref 6.5–8.1)

## 2017-09-29 LAB — SALICYLATE LEVEL: Salicylate Lvl: <7 mg/dL (ref 2.8–30.0)

## 2017-09-29 LAB — ACETAMINOPHEN LEVEL: Acetaminophen (Tylenol), Serum: <10 ug/mL — ABNORMAL LOW (ref 10–30)

## 2017-09-29 LAB — ETHANOL: Alcohol, Ethyl (B): <10 mg/dL (ref <10)

## 2017-09-29 LAB — GLUCOSE, CAPILLARY: Glucose-Capillary: 257 mg/dL — ABNORMAL HIGH (ref 65–99)
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
## 2017-09-29 NOTE — ED Provider Notes (Signed)
[facility] Emergency Department Provider Note  ____________________________________________  Time seen: Approximately 9:30 PM  I have reviewed the triage vital signs and the nursing notes.   HISTORY  Chief Complaint Paranoid    HPI Erica Mueller is a 67 y.o. female who complains of auditory hallucinations and paranoia that started today. It's constant, no aggravating or alleviating factors, moderate severity. She is fearful that people are following her and trying to kill her. She does report that she recently stopped taking some of her medications including Abilify.      Past Medical History:  Diagnosis Date  . Coronary artery disease   . Depression   . Diabetes mellitus without complication (HCC)   . Elevated lipids   . GERD (gastroesophageal reflux disease)   . Hypertension   . Syphilis (acquired)      Patient Active Problem List   Diagnosis Date Noted  . Schizophrenia (HCC) 05/27/2017  . Schizoaffective disorder, depressive type (HCC) 04/02/2017  . Diabetes mellitus without complication (HCC) 04/02/2017  . Hypertension 04/02/2017  . Noncompliance 04/02/2017     Past Surgical History:  Procedure Laterality Date  . COLONOSCOPY    . COLONOSCOPY WITH PROPOFOL N/A 09/01/2015   Procedure: COLONOSCOPY WITH PROPOFOL;  Surgeon: Wallace Cullens, MD;  Location: [facility] ENDOSCOPY;  Service: Gastroenterology;  Laterality: N/A;  . TUBAL LIGATION    . wisdom teeth removal       Prior to Admission medications   Medication Sig Start Date End Date Taking? Authorizing Provider  lovastatin (MEVACOR) 40 MG tablet Take 40 mg by mouth at bedtime.   Yes [provider]  metFORMIN (GLUCOPHAGE) 500 MG tablet Take by mouth 2 (two) times daily with a meal.   Yes [provider]     Allergies Patient has no known allergies.   History reviewed. No pertinent family history.  Social History Social History   Tobacco Use  . Smoking status:  Current Every Day Smoker    Packs/day: 1.00    Types: Cigarettes  . Smokeless tobacco: Never Used  Substance Use Topics  . Alcohol use: Yes    Comment: occassionally  . Drug use: No    Review of Systems  Constitutional:   No fever or chills.  Cardiovascular:   No chest pain or syncope. Respiratory:   No dyspnea chronic nonproductive cough. Gastrointestinal:   Negative for abdominal pain, vomiting and diarrhea.  Musculoskeletal:   Negative for focal pain or swelling All other systems reviewed and are negative except as documented above in ROS and HPI.  ____________________________________________   PHYSICAL EXAM:  VITAL SIGNS: ED Triage Vitals  Enc Vitals Group     BP 09/29/17 1933 (!) 185/67     Pulse Rate 09/29/17 1933 99     Resp 09/29/17 1933 17     Temp 09/29/17 1933 98.4 F (36.9 C)     Temp Source 09/29/17 1933 Axillary     SpO2 09/29/17 1933 99 %     Weight 09/29/17 1936 150 lb (68 kg)     Height 09/29/17 1936 5\' 2"  (1.575 m)     Head Circumference --      Peak Flow --      Pain Score 09/29/17 1936 0     Pain Loc --      Pain Edu? --      Excl. in GC? --     Vital signs reviewed, nursing assessments reviewed.   Constitutional:   Alert and oriented.  Well appearing and in no distress. Eyes:   Conjunctivae are normal. EOMI. PERRL. ENT      Head:   Normocephalic and atraumatic.      Nose:   No congestion/rhinnorhea.       Mouth/Throat:   MMM, no pharyngeal erythema. No peritonsillar mass. Tardive dyskinesia.      Neck:   No meningismus. Full ROM. Hematological/Lymphatic/Immunilogical:   No cervical lymphadenopathy. Cardiovascular:   RRR. Symmetric bilateral radial and DP pulses.  No murmurs.  Respiratory:   Normal respiratory effort without tachypnea/retractions. Breath sounds are clear and equal bilaterally. No wheezes/rales/rhonchi. Gastrointestinal:   Soft and nontender. Non distended. There is no CVA tenderness.  No rebound, rigidity, or  guarding. Genitourinary:   deferred Musculoskeletal:   Normal range of motion in all extremities. No joint effusions.  No lower extremity tenderness.  No edema. Neurologic:   Normal speech.  Motor grossly intact. No acute focal neurologic deficits are appreciated.  Skin:    Skin is warm, dry and intact. No rash noted.  No petechiae, purpura, or bullae.  ____________________________________________    LABS (pertinent positives/negatives) (all labs ordered are listed, but only abnormal results are displayed) Labs Reviewed  COMPREHENSIVE METABOLIC PANEL - Abnormal; Notable for the following components:      Result Value   Glucose, Bld 309 (*)    Total Protein 8.2 (*)    All other components within normal limits  ACETAMINOPHEN LEVEL - Abnormal; Notable for the following components:   Acetaminophen (Tylenol), Serum <10 (*)    All other components within normal limits  GLUCOSE, CAPILLARY - Abnormal; Notable for the following components:   Glucose-Capillary 257 (*)    All other components within normal limits  ETHANOL  SALICYLATE LEVEL  CBC  URINE DRUG SCREEN, QUALITATIVE ([facility] ONLY)   ____________________________________________   EKG    ____________________________________________    RADIOLOGY  No results found.  ____________________________________________   PROCEDURES Procedures  ____________________________________________    CLINICAL IMPRESSION / ASSESSMENT AND PLAN / ED COURSE  Pertinent labs & imaging results that were available during my care of the patient were reviewed by me and considered in my medical decision making (see chart for details).    patient well-appearing no acute distress, unremarkable vital signs, presents with paranoia and auditory hallucinations consistent with acute psychosis, decompensated schizophrenia. Likely due to medication noncompliance. I'll place her under IVC for now for her safety, we'll follow-up psychiatry  consultation.      ____________________________________________   FINAL CLINICAL IMPRESSION(S) / ED DIAGNOSES    Final diagnoses:  Acute psychosis [facility])     ED Discharge Orders    None      Portions of this note were generated with dragon dictation software. Dictation errors may occur despite best attempts at proofreading.    Sharman Cheek, MD 09/29/17 2133
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
## 2017-09-30 ENCOUNTER — Inpatient Hospital Stay: Admission: AD | Admit: 2017-09-30 | Payer: Medicare Other | Source: Intra-hospital | Admitting: Psychiatry

## 2017-09-30 DIAGNOSIS — F203 Undifferentiated schizophrenia: Secondary | ICD-10-CM

## 2017-09-30 LAB — GLUCOSE, CAPILLARY: GLUCOSE-CAPILLARY: 243 mg/dL — AB (ref 65–99)

## 2017-09-30 MED ORDER — BENZTROPINE MESYLATE 0.5 MG PO TABS: 0.5000 mg | ORAL_TABLET | Freq: Two times a day (BID) | ORAL | 2 refills | Status: DC

## 2017-09-30 MED ORDER — PRAVASTATIN SODIUM 40 MG PO TABS
40.0000 mg | ORAL_TABLET | Freq: Every day | ORAL | Status: DC
  Administered 2017-09-30: 40 mg via ORAL
  Filled 2017-09-30: qty 1

## 2017-09-30 MED ORDER — THIOTHIXENE 5 MG PO CAPS
5.0000 mg | ORAL_CAPSULE | Freq: Two times a day (BID) | ORAL | Status: DC
  Filled 2017-09-30 (×2): qty 1

## 2017-09-30 MED ORDER — THIOTHIXENE 5 MG PO CAPS: 5.0000 mg | ORAL_CAPSULE | Freq: Two times a day (BID) | ORAL | 2 refills | Status: DC

## 2017-09-30 MED ORDER — HYDROXYZINE HCL 25 MG PO TABS
ORAL_TABLET | ORAL | Status: AC
  Administered 2017-09-30: 50 mg
  Filled 2017-09-30: qty 2

## 2017-09-30 MED ORDER — BENZTROPINE MESYLATE 1 MG PO TABS: 0.5000 mg | ORAL_TABLET | Freq: Two times a day (BID) | ORAL | Status: DC

## 2017-09-30 MED ORDER — METFORMIN HCL 500 MG PO TABS
500.0000 mg | ORAL_TABLET | Freq: Two times a day (BID) | ORAL | Status: DC
  Administered 2017-09-30 (×2): 500 mg via ORAL
  Filled 2017-09-30 (×2): qty 1

## 2017-09-30 NOTE — ED Notes (Signed)
Per Patient, "I'm hearing voices."

## 2017-09-30 NOTE — ED Notes (Signed)
Pt agrees to self pay for cab.  [employer] called, transportation set up, approximately 45 minute wait.

## 2017-09-30 NOTE — ED Notes (Signed)
Received call back from brother, pt is unable to get off work and suggest that pt take a cab home.

## 2017-09-30 NOTE — ED Notes (Signed)
Contacted patients brother, Joslyn Devon, states he is at work right now, but will attempt to arrange for transportation.  Awaiting call back.

## 2017-09-30 NOTE — ED Notes (Signed)
Psychiatry to bedside at this time. 

## 2017-09-30 NOTE — Discharge Instructions (Addendum)
You have been seen in the Emergency Department (ED)  today for a psychiatric complaint.  You have been evaluated by psychiatry and we believe you are safe to be discharged from the hospital.   ° °Please return to the Emergency Department (ED)  immediately if you have ANY thoughts of hurting yourself or anyone else, so that we may help you. ° °Please avoid alcohol and drug use. ° °Follow up with your doctor and/or therapist as soon as possible regarding today's ED  visit.  ° °You may call crisis hotline for [place] at [phone]. ° °

## 2017-09-30 NOTE — ED Notes (Signed)
TTS to bedside. 

## 2017-09-30 NOTE — Progress Notes (Signed)
Inpatient Diabetes Program Recommendations  AACE/ADA: New Consensus Statement on Inpatient Glycemic Control (2015)  Target Ranges:  Prepandial:   less than 140 mg/dL      Peak postprandial:   less than 180 mg/dL (1-2 hours)      Critically ill patients:  140 - 180 mg/dL   Results for OLISHA, SWIATEK (MRN [id number]) as of 09/30/2017 08:33  Ref. Range 09/29/2017 21:18  Glucose-Capillary Latest Ref Range: 65 - 99 mg/dL 920 (H)  Results for JAIDAH, IWEN (MRN [id number]) as of 09/30/2017 08:33  Ref. Range 05/27/2017 06:57  Hemoglobin A1C Latest Ref Range: 4.8 - 5.6 % 6.6 (H)   Review of Glycemic Control  Diabetes history: DM2 Outpatient Diabetes medications: Metformin 500 mg BID (no other DM meds listed on home med list but noted in chart that patient has been on Actos 15 mg daily and Januvia 100 mg daily as an outpatient as well) Current orders for Inpatient glycemic control: Metformin 500 mg BID  Inpatient Diabetes Program Recommendations: Correction (SSI): Please consider ordering CBGs with Novolog 0-15 units TID with meals and Novolog 0-5 units QHS. Oral Agents: Please consider ordering Actos 15 mg daily and Tradjenta 5 mg daily.  Thanks, Orlando Penner, RN, MSN, CDE Diabetes Coordinator Inpatient Diabetes Program [phone] (Team Pager from 8am to 5pm)

## 2017-09-30 NOTE — ED Notes (Signed)
Lunch tray left at bedside for when pt awakes.

## 2017-09-30 NOTE — ED Notes (Signed)
Pt ambulatory to bathroom; shower supplies provided upon request.

## 2017-09-30 NOTE — ED Notes (Signed)
Pt given behavioral clothing to change into after showering, pt showered for approx 10 mins and was provided with all toiletry needs

## 2017-09-30 NOTE — ED Notes (Signed)
Pt asked for snack and juice; rn notified

## 2017-09-30 NOTE — BH Assessment (Signed)
TTS attempted assessment of pt. When TTS entered room, pt in far right corner standing and appeared to be talking to herself with lights off. Pt asked to see TTS badge and appeared suspicous. Pt reacting to internal stimuli, had difficulty with responding to questions coherently.

## 2017-09-30 NOTE — ED Notes (Signed)
Pt provided meal tray by this tech, pt expresses no further needs at this time  
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
## 2017-09-30 NOTE — ED Notes (Signed)
Pt ambulatory to bathroom independently

## 2017-09-30 NOTE — ED Provider Notes (Signed)
-----------------------------------------   5:18 PM on 09/30/2017 -----------------------------------------   Blood pressure (!) 181/68, pulse 77, temperature 98.1 F (36.7 C), temperature source Oral, resp. rate 20, height 5\' 2"  (1.575 m), weight 68 kg (150 lb), SpO2 99 %.  Patient has been evaluated by psychiatry and cleared for discharge. IVC lifted by Dr. Toni Amend. Patient's labs have been reviewed with no acute findings. Patient will be discharged at this time to home    Don Perking, [place], MD 09/30/17 1718

## 2017-09-30 NOTE — ED Notes (Signed)
Pt waiting for taxi to take her home.

## 2017-09-30 NOTE — ED Provider Notes (Signed)
-----------------------------------------   6:43 AM on 09/30/2017 -----------------------------------------   Blood pressure (!) 185/67, pulse 99, temperature 98.4 F (36.9 C), temperature source Axillary, resp. rate 17, height 5\' 2"  (1.575 m), weight 68 kg (150 lb), SpO2 99 %.  The patient had no acute events since last update.  Calm and cooperative at this time.  Disposition is pending Psychiatry/Behavioral Medicine team recommendations.     Irean Hong, MD 09/30/17 [phone]
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
## 2017-09-30 NOTE — ED Notes (Signed)
Pt provided breakfast tray.

## 2017-10-01 ENCOUNTER — Encounter: Payer: Self-pay | Admitting: Emergency Medicine

## 2017-10-01 ENCOUNTER — Other Ambulatory Visit: Payer: Self-pay

## 2017-10-01 ENCOUNTER — Emergency Department
Admission: EM | Admit: 2017-10-01 | Discharge: 2017-10-02 | Disposition: A | Discharge status: Other Acute Inpatient Hospital | Payer: Medicare Other | Attending: Emergency Medicine | Admitting: Emergency Medicine

## 2017-10-01 DIAGNOSIS — Z9119 Patient's noncompliance with other medical treatment and regimen: Secondary | ICD-10-CM

## 2017-10-01 DIAGNOSIS — F209 Schizophrenia, unspecified: Secondary | ICD-10-CM | POA: Diagnosis present

## 2017-10-01 DIAGNOSIS — F17210 Nicotine dependence, cigarettes, uncomplicated: Secondary | ICD-10-CM | POA: Diagnosis not present

## 2017-10-01 DIAGNOSIS — F4321 Adjustment disorder with depressed mood: Secondary | ICD-10-CM

## 2017-10-01 DIAGNOSIS — F203 Undifferentiated schizophrenia: Secondary | ICD-10-CM

## 2017-10-01 DIAGNOSIS — I2510 Atherosclerotic heart disease of native coronary artery without angina pectoris: Secondary | ICD-10-CM | POA: Insufficient documentation

## 2017-10-01 DIAGNOSIS — I10 Essential (primary) hypertension: Secondary | ICD-10-CM | POA: Insufficient documentation

## 2017-10-01 DIAGNOSIS — Z79899 Other long term (current) drug therapy: Secondary | ICD-10-CM | POA: Insufficient documentation

## 2017-10-01 DIAGNOSIS — E1165 Type 2 diabetes mellitus with hyperglycemia: Secondary | ICD-10-CM

## 2017-10-01 DIAGNOSIS — R440 Auditory hallucinations: Secondary | ICD-10-CM | POA: Insufficient documentation

## 2017-10-01 DIAGNOSIS — Z046 Encounter for general psychiatric examination, requested by authority: Secondary | ICD-10-CM | POA: Insufficient documentation

## 2017-10-01 DIAGNOSIS — R45851 Suicidal ideations: Secondary | ICD-10-CM | POA: Diagnosis not present

## 2017-10-01 DIAGNOSIS — E11649 Type 2 diabetes mellitus with hypoglycemia without coma: Secondary | ICD-10-CM

## 2017-10-01 DIAGNOSIS — E119 Type 2 diabetes mellitus without complications: Secondary | ICD-10-CM | POA: Insufficient documentation

## 2017-10-01 DIAGNOSIS — F329 Major depressive disorder, single episode, unspecified: Secondary | ICD-10-CM | POA: Diagnosis present

## 2017-10-01 DIAGNOSIS — E1169 Type 2 diabetes mellitus with other specified complication: Secondary | ICD-10-CM

## 2017-10-01 DIAGNOSIS — Z91199 Patient's noncompliance with other medical treatment and regimen due to unspecified reason: Secondary | ICD-10-CM

## 2017-10-01 LAB — COMPREHENSIVE METABOLIC PANEL
ALBUMIN: 4.1 g/dL (ref 3.5–5.0)
ALK PHOS: 53 U/L (ref 38–126)
ALT: 17 U/L (ref 14–54)
AST: 16 U/L (ref 15–41)
Anion gap: 6 (ref 5–15)
BILIRUBIN TOTAL: 0.5 mg/dL (ref 0.3–1.2)
BUN: 8 mg/dL (ref 6–20)
CALCIUM: 9.7 mg/dL (ref 8.9–10.3)
CO2: 29 mmol/L (ref 22–32)
Chloride: 105 mmol/L (ref 101–111)
Creatinine, Ser: 0.62 mg/dL (ref 0.44–1.00)
GLUCOSE: 190 mg/dL — AB (ref 65–99)
POTASSIUM: 3.6 mmol/L (ref 3.5–5.1)
Sodium: 140 mmol/L (ref 135–145)
Total Protein: 8 g/dL (ref 6.5–8.1)

## 2017-10-01 LAB — ACETAMINOPHEN LEVEL

## 2017-10-01 LAB — CBC
HEMATOCRIT: 40.7 % (ref 35.0–47.0)
Hemoglobin: 13.5 g/dL (ref 12.0–16.0)
MCH: 30.2 pg (ref 26.0–34.0)
MCHC: 33.1 g/dL (ref 32.0–36.0)
MCV: 91 fL (ref 80.0–100.0)
PLATELETS: 228 10*3/uL (ref 150–440)
RBC: 4.47 MIL/uL (ref 3.80–5.20)
RDW: 14.2 % (ref 11.5–14.5)
WBC: 4 10*3/uL (ref 3.6–11.0)

## 2017-10-01 LAB — SALICYLATE LEVEL

## 2017-10-01 LAB — ETHANOL

## 2017-10-01 MED ORDER — BENZTROPINE MESYLATE 1 MG PO TABS
0.5000 mg | ORAL_TABLET | Freq: Two times a day (BID) | ORAL | Status: DC
  Administered 2017-10-01 – 2017-10-02 (×2): 0.5 mg via ORAL
  Filled 2017-10-01 (×2): qty 1

## 2017-10-01 MED ORDER — PRAVASTATIN SODIUM 40 MG PO TABS
40.0000 mg | ORAL_TABLET | Freq: Every day | ORAL | Status: DC
  Filled 2017-10-01: qty 1

## 2017-10-01 MED ORDER — THIOTHIXENE 5 MG PO CAPS
5.0000 mg | ORAL_CAPSULE | Freq: Two times a day (BID) | ORAL | Status: DC
  Administered 2017-10-01 – 2017-10-02 (×2): 5 mg via ORAL
  Filled 2017-10-01 (×2): qty 1

## 2017-10-01 MED ORDER — METFORMIN HCL 500 MG PO TABS
500.0000 mg | ORAL_TABLET | Freq: Two times a day (BID) | ORAL | Status: DC
  Administered 2017-10-02: 500 mg via ORAL
  Filled 2017-10-01: qty 1

## 2017-10-01 NOTE — ED Notes (Signed)
Pt. Alert and oriented, warm and dry, in no distress. Pt. Denies SI, HI, and AVH. Pt. Encouraged to let nursing staff know of any concerns or needs. 

## 2017-10-01 NOTE — ED Notes (Signed)
Money locked in safe 6 with key 6  - key and yellow property form placed in pixis

## 2017-10-01 NOTE — ED Notes (Signed)
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
## 2017-10-01 NOTE — ED Notes (Addendum)
Erica Mueller her grandson is coming to the ED to pick up her money and her house keys so that hemay take care of her dog while she is here   201.00 to be given to him upon arrival
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
## 2017-10-01 NOTE — ED Notes (Signed)

## 2017-10-01 NOTE — ED Notes (Addendum)
Pt dressed out into appropriate behavioral health clothing. Pt belongings consist of an orange shirt, white tennis shoes, white socks, gray pants, a blue bra and a brown purse with a pack of cigarettes, a lighter, a black wallet. Pt calm and cooperative while dressing out. Pt has a pull up on. A clear plastic bag with ten twenty dollar bills and a one dollar bill ($201) and a key ring with a silver key and two cards on it was given to Amy T and is to be given to pt grandson Brianna Audibert once he arrives.

## 2017-10-01 NOTE — ED Notes (Signed)
BEHAVIORAL HEALTH ROUNDING Patient sleeping: Yes.   Patient alert and oriented: eyes closed  Appears to be asleep Behavior appropriate: Yes.  ; If no, describe:  Nutrition and fluids offered: Yes  Toileting and hygiene offered: sleeping Sitter present: q 15 minute observations and security monitoring Law enforcement present: yes   

## 2017-10-01 NOTE — Consult Note (Signed)
[facility] Face-to-Face Psychiatry Consult   Reason for Consult: Consult for 67 year old woman with a history of schizophrenia who comes back to the emergency room for the second day in a row this time complaining of worsening psychotic symptoms Referring Physician: Mariea Clonts Patient Identification: Erica Mueller MRN:  [id number] Principal Diagnosis: Schizophrenia [facility]) Diagnosis:   Patient Active Problem List   Diagnosis Date Noted  . Schizophrenia ([place]) [F20.9] 05/27/2017  . Schizoaffective disorder, depressive type ([place]) [F25.1] 04/02/2017  . Diabetes mellitus without complication ([place]) [J24.2] 04/02/2017  . Hypertension [I10] 04/02/2017  . Noncompliance [Z91.19] 04/02/2017    Total Time spent with patient: 1 hour  Subjective:   Erica Mueller is a 67 y.o. female patient admitted with "I just do not need to be at home".  HPI: Patient interviewed chart reviewed.  67 year old woman well-known to the psychiatric service with a history of schizophrenia.  She was in the emergency room yesterday and was discharged back home but comes back today saying last night she was once again hearing people lurking around outside her trailer.  Could not sleep at night.  Feeling paranoid.  Having occasional hallucinations during the day.  She did not get her prescription filled and did not take her antipsychotic medicine last night.  Denies any suicidal thoughts.  Seems disheveled and nervous.  Social history: Patient has been continuing to live independently although it sounds like she is having increasing trouble managing that.  She had been living with some grandchildren but threw them out of the house saying that they were robbing her.  Medical history: Diabetes and high blood pressure both not all that well managed.  Substance abuse history: Denies  Past Psychiatric History: Patient has a long history of mental health problems going back decades with multiple hospitalizations.  No history of suicide  attempts.  Patient has developed tardive dyskinesia which is very severe but despite that she refuses to take any medicine except Navane even having been educated about what is making her tardive dyskinesia worse.  She does not have an act team and is not following up with her medicine very reliably.  Risk to Self: Is patient at risk for suicide?: Yes Risk to Others:   Prior Inpatient Therapy:   Prior Outpatient Therapy:    Past Medical History:  Past Medical History:  Diagnosis Date  . Coronary artery disease   . Depression   . Diabetes mellitus without complication ([place])   . Elevated lipids   . GERD (gastroesophageal reflux disease)   . Hypertension   . Syphilis (acquired)     Past Surgical History:  Procedure Laterality Date  . COLONOSCOPY    . COLONOSCOPY WITH PROPOFOL N/A 09/01/2015   Procedure: COLONOSCOPY WITH PROPOFOL;  Surgeon: Hulen Luster, MD;  Location: [facility] ENDOSCOPY;  Service: Gastroenterology;  Laterality: N/A;  . TUBAL LIGATION    . wisdom teeth removal     Family History: No family history on file. Family Psychiatric  History: Positive for other people with vaguely described mental health problems Social History:  Social History   Substance and Sexual Activity  Alcohol Use Yes   Comment: occassionally     Social History   Substance and Sexual Activity  Drug Use No    Social History   Socioeconomic History  . Marital status: Divorced    Spouse name: Not on file  . Number of children: Not on file  . Years of education: Not on file  . Highest education level: Not  on file  Occupational History  . Not on file  Social Needs  . Financial resource strain: Not on file  . Food insecurity:    Worry: Not on file    Inability: Not on file  . Transportation needs:    Medical: Not on file    Non-medical: Not on file  Tobacco Use  . Smoking status: Current Every Day Smoker    Packs/day: 1.00    Types: Cigarettes  . Smokeless tobacco: Never Used  Substance  and Sexual Activity  . Alcohol use: Yes    Comment: occassionally  . Drug use: No  . Sexual activity: Not on file  Lifestyle  . Physical activity:    Days per week: Not on file    Minutes per session: Not on file  . Stress: Not on file  Relationships  . Social connections:    Talks on phone: Not on file    Gets together: Not on file    Attends religious service: Not on file    Active member of club or organization: Not on file    Attends meetings of clubs or organizations: Not on file    Relationship status: Not on file  Other Topics Concern  . Not on file  Social History Narrative  . Not on file   Additional Social History:    Allergies:  No Known Allergies  Labs:  Results for orders placed or performed during the hospital encounter of 10/01/17 (from the past 48 hour(s))  Comprehensive metabolic panel     Status: Abnormal   Collection Time: 10/01/17  2:12 PM  Result Value Ref Range   Sodium 140 135 - 145 mmol/L   Potassium 3.6 3.5 - 5.1 mmol/L   Chloride 105 101 - 111 mmol/L   CO2 29 22 - 32 mmol/L   Glucose, Bld 190 (H) 65 - 99 mg/dL   BUN 8 6 - 20 mg/dL   Creatinine, Ser 0.62 0.44 - 1.00 mg/dL   Calcium 9.7 8.9 - 10.3 mg/dL   Total Protein 8.0 6.5 - 8.1 g/dL   Albumin 4.1 3.5 - 5.0 g/dL   AST 16 15 - 41 U/L   ALT 17 14 - 54 U/L   Alkaline Phosphatase 53 38 - 126 U/L   Total Bilirubin 0.5 0.3 - 1.2 mg/dL   GFR calc non Af Amer >60 >60 mL/min   GFR calc Af Amer >60 >60 mL/min    Comment: (NOTE) The eGFR has been calculated using the CKD EPI equation. This calculation has not been validated in all clinical situations. eGFR's persistently <60 mL/min signify possible Chronic Kidney Disease.    Anion gap 6 5 - 15    Comment: Performed at [facility], [address] [place], [place] [address]  Ethanol     Status: None   Collection Time: 10/01/17  2:12 PM  Result Value Ref Range   Alcohol, Ethyl (B) <10 <10 mg/dL    Comment:        LOWEST  DETECTABLE LIMIT FOR SERUM ALCOHOL IS 10 mg/dL FOR MEDICAL PURPOSES ONLY Performed at [facility], [address] [place], [place] [address]   Salicylate level     Status: None   Collection Time: 10/01/17  2:12 PM  Result Value Ref Range   Salicylate Lvl <2.9 2.8 - 30.0 mg/dL    Comment: Performed at [facility], [address], [place], [place] [address]  Acetaminophen level     Status: Abnormal  Collection Time: 10/01/17  2:12 PM  Result Value Ref Range   Acetaminophen (Tylenol), Serum <10 (L) 10 - 30 ug/mL    Comment:        THERAPEUTIC CONCENTRATIONS VARY SIGNIFICANTLY. A RANGE OF 10-30 ug/mL MAY BE AN EFFECTIVE CONCENTRATION FOR MANY PATIENTS. HOWEVER, SOME ARE BEST TREATED AT CONCENTRATIONS OUTSIDE THIS RANGE. ACETAMINOPHEN CONCENTRATIONS >150 ug/mL AT 4 HOURS AFTER INGESTION AND >50 ug/mL AT 12 HOURS AFTER INGESTION ARE OFTEN ASSOCIATED WITH TOXIC REACTIONS. Performed at [facility], [address] [place], [place] [address]   cbc     Status: None   Collection Time: 10/01/17  2:12 PM  Result Value Ref Range   WBC 4.0 3.6 - 11.0 K/uL   RBC 4.47 3.80 - 5.20 MIL/uL   Hemoglobin 13.5 12.0 - 16.0 g/dL   HCT 40.7 35.0 - 47.0 %   MCV 91.0 80.0 - 100.0 fL   MCH 30.2 26.0 - 34.0 pg   MCHC 33.1 32.0 - 36.0 g/dL   RDW 14.2 11.5 - 14.5 %   Platelets 228 150 - 440 K/uL    Comment: Performed at [facility], [address], [place], [place] [address]    Current Facility-Administered Medications  Medication Dose Route Frequency Provider Last Rate Last Dose  . benztropine (COGENTIN) tablet 0.5 mg  0.5 mg Oral BID Nena Polio, MD      . metFORMIN (GLUCOPHAGE) tablet 500 mg  500 mg Oral BID WC Nena Polio, MD      . pravastatin (PRAVACHOL) tablet 40 mg  40 mg Oral q1800 Nena Polio, MD      . thiothixene (NAVANE) capsule 5 mg  5 mg Oral BID Nena Polio, MD       Current Outpatient Medications  Medication Sig  Dispense Refill  . benztropine (COGENTIN) 0.5 MG tablet Take 1 tablet (0.5 mg total) by mouth 2 (two) times daily. 60 tablet 2  . lovastatin (MEVACOR) 40 MG tablet Take 40 mg by mouth at bedtime.    . metFORMIN (GLUCOPHAGE) 500 MG tablet Take by mouth 2 (two) times daily with a meal.    . thiothixene (NAVANE) 5 MG capsule Take 1 capsule (5 mg total) by mouth 2 (two) times daily. 60 capsule 2    Musculoskeletal: Strength & Muscle Tone: within normal limits Gait & Station: normal Patient leans: N/A  Psychiatric Specialty Exam: Physical Exam  Nursing note and vitals reviewed. Constitutional: She appears well-developed and well-nourished.  HENT:  Head: Normocephalic and atraumatic.  Eyes: Pupils are equal, round, and reactive to light. Conjunctivae are normal.  Neck: Normal range of motion.  Cardiovascular: Regular rhythm and normal heart sounds.  Respiratory: Effort normal. No respiratory distress.  GI: Soft.  Musculoskeletal: Normal range of motion.  Neurological: She is alert.  Skin: Skin is warm and dry.  Psychiatric: Her affect is blunt. Her speech is delayed, tangential and slurred. She is not agitated. Thought content is paranoid. Cognition and memory are impaired. She expresses impulsivity. She expresses no homicidal and no suicidal ideation.    Review of Systems  Constitutional: Negative.   HENT: Negative.   Eyes: Negative.   Respiratory: Negative.   Cardiovascular: Negative.   Gastrointestinal: Negative.   Musculoskeletal: Negative.   Skin: Negative.   Neurological: Negative.   Psychiatric/Behavioral: Positive for hallucinations. Negative for depression, memory loss, substance abuse and suicidal ideas. The patient is nervous/anxious and has insomnia.     Blood pressure (!) 175/83, pulse Marland Kitchen)  103, temperature (!) 97.5 F (36.4 C), temperature source Oral, resp. rate 16, height 5' 2"  (1.575 m), weight 68 kg (150 lb), SpO2 97 %.Body mass index is 27.44 kg/m.  General  Appearance: Disheveled  Eye Contact:  Fair  Speech:  Slow  Volume:  Decreased  Mood:  Anxious and Dysphoric  Affect:  Congruent  Thought Process:  Disorganized  Orientation:  Full (Time, Place, and Person)  Thought Content:  Illogical, Paranoid Ideation and Rumination  Suicidal Thoughts:  No  Homicidal Thoughts:  No  Memory:  Immediate;   Fair Recent;   Fair Remote;   Fair  Judgement:  Impaired  Insight:  Shallow  Psychomotor Activity:  Decreased and TD  Concentration:  Concentration: Fair  Recall:  [employer] of Knowledge:  Fair  Language:  Poor  Akathisia:  No  Handed:  Right  AIMS (if indicated):     Assets:  Desire for Improvement Housing  ADL's:  Impaired  Cognition:  Impaired,  Mild  Sleep:        Treatment Plan Summary: Daily contact with patient to assess and evaluate symptoms and progress in treatment, Medication management and Plan 67 year old woman with schizophrenia whose function continues to decline.  It seems even more clear to me today that the patient is really not managing to take care of herself outside the hospital.  She is showing even more psychotic symptoms today than yesterday.  Patient is asking to be in the hospital because she is afraid of going home.  Spoke with TTS.  I recommend that we attempt to refer the patient to a geriatric psychiatry hospital due to her age and needs medically.  Patient is completely cooperative and agreeable to the plan.  Continue under current paperwork medications ordered.  Disposition: Recommend psychiatric Inpatient admission when medically cleared. Supportive therapy provided about ongoing stressors.  Alethia Berthold, MD 10/01/2017 6:11 PM

## 2017-10-01 NOTE — ED Notes (Signed)
Pt arrived with 201.00  Pt has a visit from her grandson  Debby Bud - pt requests that he take her keys and a 20 dollar bill  181.00 locked in hospital safe

## 2017-10-01 NOTE — ED Notes (Signed)
BEHAVIORAL HEALTH ROUNDING Patient sleeping: No. Patient alert and oriented: yes Behavior appropriate: Yes.  ; If no, describe:  Nutrition and fluids offered: yes Toileting and hygiene offered: Yes  Sitter present: q15 minute observations and security monitoring Law enforcement present: Yes    

## 2017-10-02 LAB — URINE DRUG SCREEN, QUALITATIVE (ARMC ONLY)
Amphetamines, Ur Screen: NOT DETECTED
BARBITURATES, UR SCREEN: NOT DETECTED
BENZODIAZEPINE, UR SCRN: NOT DETECTED
CANNABINOID 50 NG, UR ~~LOC~~: NOT DETECTED
Cocaine Metabolite,Ur ~~LOC~~: NOT DETECTED
MDMA (Ecstasy)Ur Screen: NOT DETECTED
Methadone Scn, Ur: NOT DETECTED
Opiate, Ur Screen: NOT DETECTED
PHENCYCLIDINE (PCP) UR S: NOT DETECTED
TRICYCLIC, UR SCREEN: NOT DETECTED

## 2017-10-02 NOTE — BH Assessment (Addendum)
Patient has been accepted to [facility].  Patient assigned to room 3216-A Accepting physician is Dr. Hansel Feinstein.  Call report to [phone].  Representative was Reuel Boom.   Pt to arrive at close to 10am as possible.  ER Staff is aware of it:  [facility].;  Dr. Zenda Alpers, ER MD  Selena Batten Patient's Nurse

## 2017-10-02 NOTE — BH Assessment (Signed)
This Clinical research associate received call from Susa Simmonds (brother) at [phone]. [name] stated he has to work but the earliest he could pick up patient would be lunch time. Writer informed [name] per TTS Durward Mallard, [facility] rep stated to call back at 8:30am to confirm transport. Writer informed [name] she would call him if he is needed to transport patient to [facility].

## 2017-10-02 NOTE — ED Notes (Signed)
This Clinical research associate spoke to patient's emergency contact Susa Simmonds to notify of acceptance to [facility] and needing transportation to hospital. Susa Simmonds states would call back to notify if will be able to transport.

## 2017-10-02 NOTE — ED Notes (Signed)

## 2017-10-02 NOTE — ED Notes (Signed)
Awake - sitting on her bed  Water and juice provided  Pt verbalizes a desire to go home and clean up prior to transferring to [facility]   Pt is voluntary   Received in report that family (her brother and a sister) has been called but they were unable to provide her transportation  We are to call [facility] back shortly  Pt in agreement with the plan to admit her to a geriatric psych unit at [facility]

## 2017-10-02 NOTE — BH Assessment (Signed)
Patient's referral information faxed to:  [facility]   [address] [place], [place] [address] Phone: [phone] Fax: [phone]  [facility]   [address] [place] [address] phone: [phone] Fax: [phone]  [facility]   [address] [place] [address] Phone: [phone] Fax: [phone]  [facility]    [address] [place] [address] Phone: [phone] Fax: [phone]  [facility]    [address] [place], [place] [address] Phone: [phone] Fax: [phone]

## 2017-10-02 NOTE — ED Notes (Signed)
meds administered as ordered   

## 2017-10-02 NOTE — ED Notes (Signed)
ED  Is the patient under IVC or is there intent for IVC: no voluntary Is the patient medically cleared: Yes.   Is there vacancy in the ED BHU: Yes.   Is the population mix appropriate for patient: Yes Is the patient awaiting placement in inpatient or outpatient setting: acceptance to [facility] - pending transportation Has the patient had a psychiatric consult: Yes.   Survey of unit performed for contraband, proper placement and condition of furniture, tampering with fixtures in bathroom, shower, and each patient room: Yes.  ; Findings:  APPEARANCE/BEHAVIOR Calm and cooperative NEURO ASSESSMENT Orientation: oriented x3  Denies pain Hallucinations: No.None noted (Hallucinations) pt verbalizes auditory hallucinations Speech: Normal to garbled   Gait: normal RESPIRATORY ASSESSMENT Even  Unlabored respirations  CARDIOVASCULAR ASSESSMENT Pulses equal   regular rate  Skin warm and dry   GASTROINTESTINAL ASSESSMENT no GI complaint EXTREMITIES Full ROM  PLAN OF CARE Provide calm/safe environment. Vital signs assessed twice daily. ED BHU Assessment once each 12-hour shift. Collaborate with TTS daily or as condition indicates. Assure the ED provider has rounded once each shift. Provide and encourage hygiene. Provide redirection as needed. Assess for escalating behavior; address immediately and inform ED provider.  Assess family dynamic and appropriateness for visitation as needed: Yes.  ; If necessary, describe findings:  Educate the patient/family about BHU procedures/visitation: Yes.  ; If necessary, describe findings:

## 2017-10-02 NOTE — BH Assessment (Signed)
This Clinical research associate contacted patients brother Susa Simmonds at [phone] and informed him patient will be transported to [facility] and provided address to [employer] and telephone number.

## 2017-10-02 NOTE — ED Notes (Addendum)
Pt given breakfast tray. Pt making bed at this time and wanting to know what time she is leaving. This tech told pt no sure at this time.

## 2017-10-02 NOTE — ED Notes (Signed)
VOL/ Accepted to [facility]/ Needs transport

## 2017-10-02 NOTE — ED Notes (Signed)
EMTALA checked by this RN, waiting on patient's signature.

## 2017-10-02 NOTE — ED Notes (Signed)
Pt to transfer to [facility] at this time  181.00 from the safe returned to her and she signed for security  Pt received back all of her belongings and verbalizes that she did   She is being transported by [employer] transport

## 2017-10-02 NOTE — BH Assessment (Signed)
Per ED Secretary Beverely Pace will arrive within an hour to transport patient to [facility].

## 2017-10-09 ENCOUNTER — Encounter: Payer: Self-pay | Admitting: *Deleted

## 2017-10-09 ENCOUNTER — Emergency Department
Admission: EM | Admit: 2017-10-09 | Discharge: 2017-10-09 | Disposition: A | Discharge status: Home / Self Care | Payer: Medicare Other | Attending: Emergency Medicine | Admitting: Emergency Medicine

## 2017-10-09 ENCOUNTER — Other Ambulatory Visit: Payer: Self-pay

## 2017-10-09 DIAGNOSIS — Z7984 Long term (current) use of oral hypoglycemic drugs: Secondary | ICD-10-CM | POA: Diagnosis not present

## 2017-10-09 DIAGNOSIS — F251 Schizoaffective disorder, depressive type: Secondary | ICD-10-CM

## 2017-10-09 DIAGNOSIS — F32A Depression, unspecified: Secondary | ICD-10-CM | POA: Diagnosis present

## 2017-10-09 DIAGNOSIS — F329 Major depressive disorder, single episode, unspecified: Secondary | ICD-10-CM | POA: Diagnosis present

## 2017-10-09 DIAGNOSIS — E1169 Type 2 diabetes mellitus with other specified complication: Secondary | ICD-10-CM

## 2017-10-09 DIAGNOSIS — E119 Type 2 diabetes mellitus without complications: Secondary | ICD-10-CM | POA: Diagnosis not present

## 2017-10-09 DIAGNOSIS — Z79899 Other long term (current) drug therapy: Secondary | ICD-10-CM | POA: Insufficient documentation

## 2017-10-09 DIAGNOSIS — Z5329 Procedure and treatment not carried out because of patient's decision for other reasons: Secondary | ICD-10-CM | POA: Diagnosis not present

## 2017-10-09 DIAGNOSIS — E11649 Type 2 diabetes mellitus with hypoglycemia without coma: Secondary | ICD-10-CM

## 2017-10-09 DIAGNOSIS — Z760 Encounter for issue of repeat prescription: Secondary | ICD-10-CM | POA: Insufficient documentation

## 2017-10-09 DIAGNOSIS — Z008 Encounter for other general examination: Secondary | ICD-10-CM | POA: Diagnosis not present

## 2017-10-09 DIAGNOSIS — I2510 Atherosclerotic heart disease of native coronary artery without angina pectoris: Secondary | ICD-10-CM | POA: Insufficient documentation

## 2017-10-09 DIAGNOSIS — I10 Essential (primary) hypertension: Secondary | ICD-10-CM | POA: Diagnosis not present

## 2017-10-09 DIAGNOSIS — E1165 Type 2 diabetes mellitus with hyperglycemia: Secondary | ICD-10-CM

## 2017-10-09 DIAGNOSIS — F17210 Nicotine dependence, cigarettes, uncomplicated: Secondary | ICD-10-CM | POA: Diagnosis not present

## 2017-10-09 DIAGNOSIS — R7309 Other abnormal glucose: Secondary | ICD-10-CM | POA: Diagnosis present

## 2017-10-09 NOTE — ED Notes (Addendum)
Pt states, "I am not signing, I was never registered in here."  Pt refuses to take discharge papers, states, "Yall didn't do nothing for me."  Cab called for patient.  Pt ambulatory to lobby at this time.

## 2017-10-09 NOTE — Discharge Instructions (Addendum)
Please seek medical attention and help for any thoughts about wanting to harm yourself, harm others, any concerning change in behavior, severe depression, inappropriate drug use or any other new or concerning symptoms. ° °

## 2017-10-09 NOTE — ED Triage Notes (Signed)
Per pt she was discharged yesterday from [facility] and only wanted a ride to [place] to get her prescription filled. She doesn't have any complaints and states she feels her blood sugar is high but doesn't want any treatment. EDP is at bedside and speaking with pt. She agrees to have labs done and IV fluid. Pt insistent that we are just trying to keep her here. Explained to pt that we are a medical hospital and want to treat her high blood sugar

## 2017-10-09 NOTE — ED Notes (Signed)
Pt is refusing medical treatment, states, "Call me a cab, I am going home."  EDP notified.

## 2017-10-09 NOTE — ED Notes (Signed)
Psychiatry to bedside at this time. 
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
## 2017-10-15 ENCOUNTER — Emergency Department (EMERGENCY_DEPARTMENT_HOSPITAL)
Admission: EM | Admit: 2017-10-15 | Discharge: 2017-10-15 | Disposition: A | Discharge status: Other Acute Inpatient Hospital | Payer: Medicare Other | Source: Home / Self Care | Attending: Emergency Medicine | Admitting: Emergency Medicine

## 2017-10-15 ENCOUNTER — Other Ambulatory Visit: Payer: Self-pay

## 2017-10-15 ENCOUNTER — Encounter: Payer: Self-pay | Admitting: *Deleted

## 2017-10-15 DIAGNOSIS — Z7984 Long term (current) use of oral hypoglycemic drugs: Secondary | ICD-10-CM

## 2017-10-15 DIAGNOSIS — E119 Type 2 diabetes mellitus without complications: Secondary | ICD-10-CM

## 2017-10-15 DIAGNOSIS — I10 Essential (primary) hypertension: Secondary | ICD-10-CM | POA: Insufficient documentation

## 2017-10-15 DIAGNOSIS — E1169 Type 2 diabetes mellitus with other specified complication: Secondary | ICD-10-CM

## 2017-10-15 DIAGNOSIS — I2510 Atherosclerotic heart disease of native coronary artery without angina pectoris: Secondary | ICD-10-CM

## 2017-10-15 DIAGNOSIS — F209 Schizophrenia, unspecified: Secondary | ICD-10-CM

## 2017-10-15 DIAGNOSIS — F200 Paranoid schizophrenia: Secondary | ICD-10-CM

## 2017-10-15 DIAGNOSIS — F251 Schizoaffective disorder, depressive type: Secondary | ICD-10-CM | POA: Diagnosis not present

## 2017-10-15 DIAGNOSIS — F17210 Nicotine dependence, cigarettes, uncomplicated: Secondary | ICD-10-CM | POA: Insufficient documentation

## 2017-10-15 DIAGNOSIS — R440 Auditory hallucinations: Secondary | ICD-10-CM

## 2017-10-15 DIAGNOSIS — Z9119 Patient's noncompliance with other medical treatment and regimen: Secondary | ICD-10-CM | POA: Insufficient documentation

## 2017-10-15 DIAGNOSIS — F32A Depression, unspecified: Secondary | ICD-10-CM | POA: Diagnosis present

## 2017-10-15 DIAGNOSIS — F329 Major depressive disorder, single episode, unspecified: Secondary | ICD-10-CM | POA: Diagnosis present

## 2017-10-15 DIAGNOSIS — E1165 Type 2 diabetes mellitus with hyperglycemia: Secondary | ICD-10-CM

## 2017-10-15 DIAGNOSIS — E11649 Type 2 diabetes mellitus with hypoglycemia without coma: Secondary | ICD-10-CM

## 2017-10-15 DIAGNOSIS — Z79899 Other long term (current) drug therapy: Secondary | ICD-10-CM

## 2017-10-15 LAB — COMPREHENSIVE METABOLIC PANEL
ALBUMIN: 3.9 g/dL (ref 3.5–5.0)
ALK PHOS: 49 U/L (ref 38–126)
ALT: 17 U/L (ref 14–54)
ANION GAP: 8 (ref 5–15)
AST: 15 U/L (ref 15–41)
BILIRUBIN TOTAL: 0.4 mg/dL (ref 0.3–1.2)
BUN: 7 mg/dL (ref 6–20)
CALCIUM: 9.3 mg/dL (ref 8.9–10.3)
CO2: 28 mmol/L (ref 22–32)
Chloride: 104 mmol/L (ref 101–111)
Creatinine, Ser: 0.54 mg/dL (ref 0.44–1.00)
GFR calc Af Amer: >60 mL/min (ref >60)
GLUCOSE: 148 mg/dL — AB (ref 65–99)
Potassium: 3.6 mmol/L (ref 3.5–5.1)
Sodium: 140 mmol/L (ref 135–145)
TOTAL PROTEIN: 7.8 g/dL (ref 6.5–8.1)

## 2017-10-15 LAB — CBC
HEMATOCRIT: 37.1 % (ref 35.0–47.0)
Hemoglobin: 12.5 g/dL (ref 12.0–16.0)
MCH: 30.4 pg (ref 26.0–34.0)
MCHC: 33.8 g/dL (ref 32.0–36.0)
MCV: 90.2 fL (ref 80.0–100.0)
Platelets: 252 10*3/uL (ref 150–440)
RBC: 4.12 MIL/uL (ref 3.80–5.20)
RDW: 14 % (ref 11.5–14.5)
WBC: 6.4 10*3/uL (ref 3.6–11.0)

## 2017-10-15 LAB — URINE DRUG SCREEN, QUALITATIVE (ARMC ONLY)
AMPHETAMINES, UR SCREEN: NOT DETECTED
Barbiturates, Ur Screen: NOT DETECTED
Benzodiazepine, Ur Scrn: NOT DETECTED
COCAINE METABOLITE, UR ~~LOC~~: NOT DETECTED
Cannabinoid 50 Ng, Ur ~~LOC~~: NOT DETECTED
MDMA (Ecstasy)Ur Screen: NOT DETECTED
METHADONE SCREEN, URINE: NOT DETECTED
OPIATE, UR SCREEN: NOT DETECTED
PHENCYCLIDINE (PCP) UR S: NOT DETECTED
Tricyclic, Ur Screen: NOT DETECTED

## 2017-10-15 LAB — ETHANOL

## 2017-10-15 LAB — SALICYLATE LEVEL: Salicylate Lvl: <7 mg/dL (ref 2.8–30.0)

## 2017-10-15 LAB — GLUCOSE, CAPILLARY
Glucose-Capillary: 132 mg/dL — ABNORMAL HIGH (ref 65–99)
Glucose-Capillary: 239 mg/dL — ABNORMAL HIGH (ref 65–99)

## 2017-10-15 LAB — ACETAMINOPHEN LEVEL: Acetaminophen (Tylenol), Serum: <10 ug/mL — ABNORMAL LOW (ref 10–30)

## 2017-10-15 MED ORDER — BENZTROPINE MESYLATE 1 MG PO TABS
0.5000 mg | ORAL_TABLET | Freq: Two times a day (BID) | ORAL | Status: DC
  Administered 2017-10-15 (×2): 0.5 mg via ORAL
  Filled 2017-10-15 (×2): qty 1

## 2017-10-15 MED ORDER — PRAVASTATIN SODIUM 20 MG PO TABS
20.0000 mg | ORAL_TABLET | Freq: Every day | ORAL | Status: DC
  Administered 2017-10-15: 20 mg via ORAL
  Filled 2017-10-15: qty 1

## 2017-10-15 MED ORDER — THIOTHIXENE 5 MG PO CAPS
5.0000 mg | ORAL_CAPSULE | Freq: Two times a day (BID) | ORAL | Status: DC
  Administered 2017-10-15: 5 mg via ORAL
  Filled 2017-10-15 (×4): qty 1

## 2017-10-15 MED ORDER — METFORMIN HCL 500 MG PO TABS
500.0000 mg | ORAL_TABLET | Freq: Two times a day (BID) | ORAL | Status: DC
  Administered 2017-10-15: 500 mg via ORAL
  Filled 2017-10-15: qty 1

## 2017-10-15 MED ORDER — NICOTINE 21 MG/24HR TD PT24
21.0000 mg | MEDICATED_PATCH | Freq: Every day | TRANSDERMAL | Status: DC
  Administered 2017-10-15: 21 mg via TRANSDERMAL

## 2017-10-15 MED ORDER — PIOGLITAZONE HCL 15 MG PO TABS
15.0000 mg | ORAL_TABLET | Freq: Every day | ORAL | Status: DC
  Filled 2017-10-15 (×2): qty 1

## 2017-10-15 MED ORDER — INSULIN ASPART 100 UNIT/ML ~~LOC~~ SOLN
0.0000 [IU] | Freq: Three times a day (TID) | SUBCUTANEOUS | Status: DC
  Administered 2017-10-15: 3 [IU] via SUBCUTANEOUS
  Filled 2017-10-15: qty 1

## 2017-10-15 MED ORDER — NICOTINE 21 MG/24HR TD PT24
MEDICATED_PATCH | TRANSDERMAL | Status: AC
  Filled 2017-10-15: qty 1

## 2017-10-15 MED ORDER — ZIPRASIDONE MESYLATE 20 MG IM SOLR
20.0000 mg | Freq: Once | INTRAMUSCULAR | Status: AC
  Administered 2017-10-15: 20 mg via INTRAMUSCULAR
  Filled 2017-10-15: qty 20
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
## 2017-10-15 NOTE — ED Notes (Signed)
Patient took sheet and trash from room and tried to flush down toilet. This Investment banker, operational intervene. Patient refused to toke out of toilet. This Clinical research associate cleaned items out of toilet and security escorted patient to room.

## 2017-10-15 NOTE — ED Notes (Signed)
Security held patient for support to make sure patient did not move while Clinical research associate gave injection.

## 2017-10-15 NOTE — ED Notes (Signed)
PT IVC PENDING PSYCH CONSULT. 

## 2017-10-15 NOTE — ED Notes (Signed)
Patient asking for her clothing. Patient advised she could not have clothing in this unit but once moved to different unit she would be able to have clothing. Patient became upset and took shirt off in dayroom. Patient was redirected by this Clinical research associate, float tech and security guard to put shirt back on. After a couple of mins patient put shirt back on.
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
## 2017-10-15 NOTE — ED Notes (Signed)
Pt does tell this RN that she drank 2 beers today and that occasionally she smokes pot.

## 2017-10-15 NOTE — ED Notes (Signed)
Report given to Kim, RN.

## 2017-10-15 NOTE — ED Notes (Signed)
Dr. Webster at bedside.  
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
## 2017-10-15 NOTE — ED Notes (Signed)
Report called to [employer]. Patient aware of transfer to BMU.

## 2017-10-15 NOTE — ED Notes (Signed)
Patient came out of room to use restroom. This Clinical research associate escorted patient to bathroom to monitor to make sure patient was not trying to flush more items. When patient was finished Clinical research associate directed patient to wash hand. Patient refused at first but finally preformed hand hygiene.

## 2017-10-15 NOTE — ED Notes (Signed)
Navane dose having to be sent from [facility] pharmacy.

## 2017-10-16 ENCOUNTER — Inpatient Hospital Stay
Admission: AD | Admit: 2017-10-16 | Discharge: 2017-10-24 | DRG: 885 | Disposition: A | Discharge status: Home / Self Care | Payer: Medicare Other | Attending: Psychiatry | Admitting: Psychiatry

## 2017-10-16 DIAGNOSIS — R451 Restlessness and agitation: Secondary | ICD-10-CM | POA: Diagnosis present

## 2017-10-16 DIAGNOSIS — F17210 Nicotine dependence, cigarettes, uncomplicated: Secondary | ICD-10-CM | POA: Diagnosis present

## 2017-10-16 DIAGNOSIS — Z7984 Long term (current) use of oral hypoglycemic drugs: Secondary | ICD-10-CM | POA: Diagnosis not present

## 2017-10-16 DIAGNOSIS — I10 Essential (primary) hypertension: Secondary | ICD-10-CM | POA: Diagnosis present

## 2017-10-16 DIAGNOSIS — G4700 Insomnia, unspecified: Secondary | ICD-10-CM | POA: Diagnosis not present

## 2017-10-16 DIAGNOSIS — K219 Gastro-esophageal reflux disease without esophagitis: Secondary | ICD-10-CM | POA: Diagnosis present

## 2017-10-16 DIAGNOSIS — E119 Type 2 diabetes mellitus without complications: Secondary | ICD-10-CM | POA: Diagnosis present

## 2017-10-16 DIAGNOSIS — G2401 Drug induced subacute dyskinesia: Secondary | ICD-10-CM | POA: Diagnosis present

## 2017-10-16 DIAGNOSIS — I2510 Atherosclerotic heart disease of native coronary artery without angina pectoris: Secondary | ICD-10-CM | POA: Diagnosis present

## 2017-10-16 DIAGNOSIS — F251 Schizoaffective disorder, depressive type: Secondary | ICD-10-CM | POA: Diagnosis present

## 2017-10-16 DIAGNOSIS — F209 Schizophrenia, unspecified: Secondary | ICD-10-CM | POA: Diagnosis present

## 2017-10-16 DIAGNOSIS — G3184 Mild cognitive impairment, so stated: Secondary | ICD-10-CM | POA: Diagnosis present

## 2017-10-16 DIAGNOSIS — L292 Pruritus vulvae: Secondary | ICD-10-CM | POA: Diagnosis not present

## 2017-10-16 DIAGNOSIS — Z79899 Other long term (current) drug therapy: Secondary | ICD-10-CM

## 2017-10-16 DIAGNOSIS — Z9119 Patient's noncompliance with other medical treatment and regimen: Secondary | ICD-10-CM

## 2017-10-16 DIAGNOSIS — R410 Disorientation, unspecified: Secondary | ICD-10-CM | POA: Diagnosis not present

## 2017-10-16 DIAGNOSIS — Z794 Long term (current) use of insulin: Secondary | ICD-10-CM | POA: Diagnosis not present

## 2017-10-16 LAB — GLUCOSE, CAPILLARY
GLUCOSE-CAPILLARY: 173 mg/dL — AB (ref 65–99)
Glucose-Capillary: 158 mg/dL — ABNORMAL HIGH (ref 65–99)
Glucose-Capillary: 209 mg/dL — ABNORMAL HIGH (ref 65–99)
Glucose-Capillary: 234 mg/dL — ABNORMAL HIGH (ref 65–99)

## 2017-10-16 LAB — HEMOGLOBIN A1C
Hgb A1c MFr Bld: 8.9 % — ABNORMAL HIGH (ref 4.8–5.6)
Mean Plasma Glucose: 208.73 mg/dL

## 2017-10-16 LAB — LIPID PANEL
CHOL/HDL RATIO: 2.4 ratio
Cholesterol: 136 mg/dL (ref 0–200)
HDL: 56 mg/dL (ref >40)
LDL Cholesterol: 62 mg/dL (ref 0–99)
Triglycerides: 90 mg/dL (ref <150)
VLDL: 18 mg/dL (ref 0–40)

## 2017-10-16 LAB — TSH: TSH: 0.757 u[IU]/mL (ref 0.350–4.500)

## 2017-10-16 MED ORDER — ACETAMINOPHEN 325 MG PO TABS: 650.0000 mg | ORAL_TABLET | Freq: Four times a day (QID) | ORAL | Status: DC | PRN

## 2017-10-16 MED ORDER — MAGNESIUM HYDROXIDE 400 MG/5ML PO SUSP
30.0000 mL | Freq: Every day | ORAL | Status: DC | PRN
  Administered 2017-10-22 (×2): 30 mL via ORAL
  Filled 2017-10-16 (×2): qty 30

## 2017-10-16 MED ORDER — ALUM & MAG HYDROXIDE-SIMETH 200-200-20 MG/5ML PO SUSP: 30.0000 mL | ORAL | Status: DC | PRN

## 2017-10-16 MED ORDER — RISPERIDONE 1 MG PO TABS
2.0000 mg | ORAL_TABLET | Freq: Every day | ORAL | Status: DC
  Administered 2017-10-16 – 2017-10-21 (×6): 2 mg via ORAL
  Filled 2017-10-16 (×6): qty 2

## 2017-10-16 MED ORDER — OLANZAPINE 10 MG PO TABS: 10.0000 mg | ORAL_TABLET | Freq: Every day | ORAL | Status: DC

## 2017-10-16 MED ORDER — PRAVASTATIN SODIUM 20 MG PO TABS
20.0000 mg | ORAL_TABLET | Freq: Every day | ORAL | Status: DC
  Administered 2017-10-17 – 2017-10-23 (×6): 20 mg via ORAL
  Filled 2017-10-16 (×9): qty 1

## 2017-10-16 MED ORDER — NICOTINE 21 MG/24HR TD PT24
21.0000 mg | MEDICATED_PATCH | Freq: Every day | TRANSDERMAL | Status: DC
  Administered 2017-10-17 – 2017-10-24 (×8): 21 mg via TRANSDERMAL
  Filled 2017-10-16 (×9): qty 1

## 2017-10-16 MED ORDER — HYDROXYZINE HCL 25 MG PO TABS
25.0000 mg | ORAL_TABLET | Freq: Three times a day (TID) | ORAL | Status: DC | PRN
  Administered 2017-10-16: 25 mg via ORAL
  Filled 2017-10-16: qty 1

## 2017-10-16 MED ORDER — INSULIN ASPART 100 UNIT/ML ~~LOC~~ SOLN
0.0000 [IU] | Freq: Three times a day (TID) | SUBCUTANEOUS | Status: DC
  Administered 2017-10-16 – 2017-10-17 (×2): 3 [IU] via SUBCUTANEOUS
  Administered 2017-10-17 – 2017-10-19 (×3): 2 [IU] via SUBCUTANEOUS
  Administered 2017-10-19: 3 [IU] via SUBCUTANEOUS
  Administered 2017-10-20 – 2017-10-21 (×2): 2 [IU] via SUBCUTANEOUS
  Administered 2017-10-22: 3 [IU] via SUBCUTANEOUS
  Administered 2017-10-22: 5 [IU] via SUBCUTANEOUS
  Administered 2017-10-23: 3 [IU] via SUBCUTANEOUS
  Administered 2017-10-23: 2 [IU] via SUBCUTANEOUS
  Administered 2017-10-23: 5 [IU] via SUBCUTANEOUS
  Administered 2017-10-24: 3 [IU] via SUBCUTANEOUS
  Filled 2017-10-16 (×11): qty 1

## 2017-10-16 MED ORDER — AMITRIPTYLINE HCL 25 MG PO TABS
25.0000 mg | ORAL_TABLET | Freq: Every day | ORAL | Status: DC
  Administered 2017-10-16 – 2017-10-23 (×7): 25 mg via ORAL
  Filled 2017-10-16 (×8): qty 1

## 2017-10-16 MED ORDER — METFORMIN HCL 500 MG PO TABS
500.0000 mg | ORAL_TABLET | Freq: Two times a day (BID) | ORAL | Status: DC
  Administered 2017-10-16 – 2017-10-22 (×12): 500 mg via ORAL
  Filled 2017-10-16 (×13): qty 1

## 2017-10-16 MED ORDER — PIOGLITAZONE HCL 15 MG PO TABS
15.0000 mg | ORAL_TABLET | Freq: Every day | ORAL | Status: DC
  Administered 2017-10-17 – 2017-10-24 (×8): 15 mg via ORAL
  Filled 2017-10-16 (×9): qty 1

## 2017-10-16 MED ORDER — BENZTROPINE MESYLATE 1 MG PO TABS
0.5000 mg | ORAL_TABLET | Freq: Two times a day (BID) | ORAL | Status: DC
  Administered 2017-10-16 – 2017-10-18 (×5): 0.5 mg via ORAL
  Filled 2017-10-16 (×5): qty 1

## 2017-10-16 MED ORDER — THIOTHIXENE 5 MG PO CAPS
5.0000 mg | ORAL_CAPSULE | Freq: Two times a day (BID) | ORAL | Status: DC
  Administered 2017-10-16: 5 mg via ORAL
  Filled 2017-10-16 (×2): qty 1

## 2017-10-16 NOTE — Care Management (Signed)
RNCM received consult for medication assistance. Patient shows that she has health insurance to include drug coverage.  I contacted and spoke with [facility] ext. [phone] and confirmed that it was a mistake and not to worry about it.  CSW will follow for APS and concerns regarding POL.

## 2017-10-16 NOTE — Progress Notes (Signed)
Recreation Therapy Notes  INPATIENT RECREATION THERAPY ASSESSMENT  Patient Details Name: Erica Mueller MRN: [id number] DOB: June 16, 1950 Today's Date: 10/16/2017       Information Obtained From: Patient  Able to Participate in Assessment/Interview: Yes  Patient Presentation: Responsive  Reason for Admission (Per Patient): Active Symptoms, Med Non-Compliance, Patient Unable to Identify  Patient Stressors: Family  Coping Skills:   Music, Other (Comment)(Smoke, House work, Investment banker, operational)  Leisure Interests (2+):  Games - Cards, Sports - Arts administrator of Recreation/Participation: Pharmacist, community Resources:  No  Community Resources:     Current Use:    If no, Barriers?:    Expressed Interest in [employer] Information:    [place] of Residence:  Film/video editor  Patient Main Form of Transportation: Other (Comment)(People take me places but try to get over on me. I want me a little car.)  Patient Strengths:  I love people   Patient Identified Areas of Improvement:  N/A  Patient Goal for Hospitalization:  To get some rest and get back on the right medication  Current SI (including self-harm):  No  Current HI:  No  Current AVH: No  Staff Intervention Plan: Group Attendance, Collaborate with Interdisciplinary Treatment Team  Consent to Intern Participation: N/A  Ilo Beamon 10/16/2017, 2:52 PM

## 2017-10-16 NOTE — Tx Team (Signed)
Initial Treatment Plan 10/16/2017 2:13 AM Sherilyn Banker ZOX:[id number]    PATIENT STRESSORS: Health problems Medication change or noncompliance   PATIENT STRENGTHS: Capable of independent living Supportive family/friends   PATIENT IDENTIFIED PROBLEMS: Mood Instability  Ineffective Coping Skills  Treatment and Medication NonCompliance  DM-II (Monitor CBG - [facility])               DISCHARGE CRITERIA:  Improved stabilization in mood, thinking, and/or behavior Motivation to continue treatment in a less acute level of care Reduction of life-threatening or endangering symptoms to within safe limits Safe-care adequate arrangements made Verbal commitment to aftercare and medication compliance  PRELIMINARY DISCHARGE PLAN: Outpatient therapy Return to previous living arrangement  PATIENT/FAMILY INVOLVEMENT: This treatment plan has been presented to and reviewed with the patient, Erica Mueller.  The patient have been given the opportunity to ask questions and make suggestions.  Cleotis Nipper, RN 10/16/2017, 2:13 AM

## 2017-10-16 NOTE — Plan of Care (Signed)
Patient slept for Estimated Hours of 4.30; Precautionary checks every 15 minutes for safety maintained, room free of safety hazards, patient sustains no injury or falls during this shift.  Problem: Activity: Goal: Will verbalize the importance of balancing activity with adequate rest periods Outcome: Not Progressing   Problem: Education: Goal: Will be free of psychotic symptoms Outcome: Not Progressing Goal: Knowledge of the prescribed therapeutic regimen will improve Outcome: Not Progressing   Problem: Coping: Goal: Coping ability will improve Outcome: Not Progressing Goal: Will verbalize feelings Outcome: Not Progressing   Problem: Health Behavior/Discharge Planning: Goal: Compliance with prescribed medication regimen will improve Outcome: Not Progressing   Problem: Nutritional: Goal: Ability to achieve adequate nutritional intake will improve Outcome: Not Progressing   Problem: Role Relationship: Goal: Ability to communicate needs accurately will improve Outcome: Not Progressing Goal: Ability to interact with others will improve Outcome: Not Progressing   Problem: Safety: Goal: Ability to redirect hostility and anger into socially appropriate behaviors will improve Outcome: Not Progressing Goal: Ability to remain free from injury will improve Outcome: Not Progressing   Problem: Self-Care: Goal: Ability to participate in self-care as condition permits will improve Outcome: Not Progressing   Problem: Self-Concept: Goal: Will verbalize positive feelings about self Outcome: Not Progressing   Problem: Education: Goal: Knowledge of [facility] Education information/materials will improve Outcome: Not Progressing Goal: Emotional status will improve Outcome: Not Progressing Goal: Mental status will improve Outcome: Not Progressing Goal: Verbalization of understanding the information provided will improve Outcome: Not Progressing   Problem: Activity: Goal:  Interest or engagement in activities will improve Outcome: Not Progressing Goal: Sleeping patterns will improve Outcome: Not Progressing   Problem: Coping: Goal: Ability to verbalize frustrations and anger appropriately will improve Outcome: Not Progressing Goal: Ability to demonstrate self-control will improve Outcome: Not Progressing   Problem: Health Behavior/Discharge Planning: Goal: Identification of resources available to assist in meeting health care needs will improve Outcome: Not Progressing Goal: Compliance with treatment plan for underlying cause of condition will improve Outcome: Not Progressing   Problem: Physical Regulation: Goal: Ability to maintain clinical measurements within normal limits will improve Outcome: Not Progressing   Problem: Safety: Goal: Periods of time without injury will increase Outcome: Not Progressing   Problem: Activity: Goal: Will identify at least one activity in which they can participate Outcome: Not Progressing   Problem: Coping: Goal: Ability to identify and develop effective coping behavior will improve Outcome: Not Progressing Goal: Ability to interact with others will improve Outcome: Not Progressing Goal: Demonstration of participation in decision-making regarding own care will improve Outcome: Not Progressing Goal: Ability to use eye contact when communicating with others will improve Outcome: Not Progressing   Problem: Health Behavior/Discharge Planning: Goal: Identification of resources available to assist in meeting health care needs will improve Outcome: Not Progressing   Problem: Self-Concept: Goal: Will verbalize positive feelings about self Outcome: Not Progressing   Problem: Education: Goal: Ability to describe self-care measures that may prevent or decrease complications (Diabetes Survival Skills Education) will improve Outcome: Not Progressing   Problem: Coping: Goal: Ability to adjust to condition or change  in health will improve Outcome: Not Progressing   Problem: Fluid Volume: Goal: Ability to maintain a balanced intake and output will improve Outcome: Not Progressing   Problem: Health Behavior/Discharge Planning: Goal: Ability to identify and utilize available resources and services will improve Outcome: Not Progressing Goal: Ability to manage health-related needs will improve Outcome: Not Progressing   Problem: Metabolic: Goal: Ability  to maintain appropriate glucose levels will improve Outcome: Not Progressing   Problem: Nutritional: Goal: Maintenance of adequate nutrition will improve Outcome: Not Progressing Goal: Progress toward achieving an optimal weight will improve Outcome: Not Progressing   Problem: Skin Integrity: Goal: Risk for impaired skin integrity will decrease Outcome: Not Progressing   Problem: Tissue Perfusion: Goal: Adequacy of tissue perfusion will improve Outcome: Not Progressing

## 2017-10-16 NOTE — Progress Notes (Signed)
Patient ID: Erica Mueller, female   DOB: 1950/09/05, 67 y.o.   MRN: [id number] Patient was last discharged from [facility] in December, 2018 and had been frequent at least 4 times to the Ed for inability to take care of herself; she called 911 for reporting various bizarre things for 8 times and now admitted IVC'd for odd and bizarre behaviors (Psychosis, Paranoia, Delusional). History of CPS and Schizoaffective d/o - depressive type. Arrived paranoid, delusional, noncompliant, intrusive (going from one room to the other), testing each door to [place]; refused to sign papers; CBG=158. Skin Assessment and Contraband search completed with Ms Laural Benes, RN. Gross Skin Intact, no open wounds; no contraband found on patient and in his belongings.

## 2017-10-16 NOTE — Progress Notes (Signed)
Patient refused VS and CBG.

## 2017-10-16 NOTE — Progress Notes (Signed)
Recreation Therapy Notes  Date: 10/16/2017  Time: 9:30 am   Location: Craft Room   Behavioral response: N/A   Intervention Topic: Stress  Discussion/Intervention: Patient did not attend group.   Clinical Observations/Feedback:  Patient did not attend group.   Adryanna Friedt LRT/CTRS        Terrin Imparato 10/16/2017 12:00 PM

## 2017-10-16 NOTE — Tx Team (Addendum)
Interdisciplinary Treatment and Diagnostic Plan Update  10/16/2017 Time of Session: 11:00am Erica Mueller MRN: [id number]  Principal Diagnosis: <principal problem not specified>  Secondary Diagnoses: Active Problems:   Schizophrenia (HCC)   Current Medications:  Current Facility-Administered Medications  Medication Dose Route Frequency Provider Last Rate Last Dose  . acetaminophen (TYLENOL) tablet 650 mg  650 mg Oral Q6H PRN Clapacs, John T, MD      . alum & mag hydroxide-simeth (MAALOX/MYLANTA) 200-200-20 MG/5ML suspension 30 mL  30 mL Oral Q4H PRN Clapacs, John T, MD      . amitriptyline (ELAVIL) tablet 25 mg  25 mg Oral QHS McNew, Holly R, MD      . benztropine (COGENTIN) tablet 0.5 mg  0.5 mg Oral BID Clapacs, John T, MD   0.5 mg at 10/16/17 8416  . hydrOXYzine (ATARAX/VISTARIL) tablet 25 mg  25 mg Oral TID PRN Clapacs, John T, MD      . insulin aspart (novoLOG) injection 0-9 Units  0-9 Units Subcutaneous TID WC Clapacs, John T, MD      . magnesium hydroxide (MILK OF MAGNESIA) suspension 30 mL  30 mL Oral Daily PRN Clapacs, John T, MD      . metFORMIN (GLUCOPHAGE) tablet 500 mg  500 mg Oral BID WC Clapacs, Jackquline Denmark, MD   500 mg at 10/16/17 0928  . nicotine (NICODERM CQ - dosed in mg/24 hours) patch 21 mg  21 mg Transdermal Daily Clapacs, John T, MD      . OLANZapine (ZYPREXA) tablet 10 mg  10 mg Oral QHS McNew, Holly R, MD      . pioglitazone (ACTOS) tablet 15 mg  15 mg Oral Daily Clapacs, John T, MD      . pravastatin (PRAVACHOL) tablet 20 mg  20 mg Oral q1800 Clapacs, Jackquline Denmark, MD       PTA Medications: Medications Prior to Admission  Medication Sig Dispense Refill Last Dose  . benztropine (COGENTIN) 0.5 MG tablet Take 1 tablet (0.5 mg total) by mouth 2 (two) times daily. 60 tablet 2 unknown  . lovastatin (MEVACOR) 40 MG tablet Take 40 mg by mouth at bedtime.   unknown  . metFORMIN (GLUCOPHAGE) 500 MG tablet Take by mouth 2 (two) times daily with a meal.   unknown  . thiothixene  (NAVANE) 5 MG capsule Take 1 capsule (5 mg total) by mouth 2 (two) times daily. 60 capsule 2 unknown    Patient Stressors: Health problems Medication change or noncompliance  Patient Strengths: Capable of independent living Supportive family/friends  Treatment Modalities: Medication Management, Group therapy, Case management,  1 to 1 session with clinician, Psychoeducation, Recreational therapy.   Physician Treatment Plan for Primary Diagnosis: <principal problem not specified> Long Term Goal(s):     Short Term Goals:    Medication Management: Evaluate patient's response, side effects, and tolerance of medication regimen.  Therapeutic Interventions: 1 to 1 sessions, Unit Group sessions and Medication administration.  Evaluation of Outcomes: Progressing  Physician Treatment Plan for Secondary Diagnosis: Active Problems:   Schizophrenia (HCC)  Long Term Goal(s):     Short Term Goals:       Medication Management: Evaluate patient's response, side effects, and tolerance of medication regimen.  Therapeutic Interventions: 1 to 1 sessions, Unit Group sessions and Medication administration.  Evaluation of Outcomes: Progressing   RN Treatment Plan for Primary Diagnosis: <principal problem not specified> Long Term Goal(s): Knowledge of disease and therapeutic regimen to maintain health will improve  Short Term  Goals: Ability to identify and develop effective coping behaviors will improve and Compliance with prescribed medications will improve  Medication Management: RN will administer medications as ordered by provider, will assess and evaluate patient's response and provide education to patient for prescribed medication. RN will report any adverse and/or side effects to prescribing provider.  Therapeutic Interventions: 1 on 1 counseling sessions, Psychoeducation, Medication administration, Evaluate responses to treatment, Monitor vital signs and CBGs as ordered, Perform/monitor  CIWA, COWS, AIMS and Fall Risk screenings as ordered, Perform wound care treatments as ordered.  Evaluation of Outcomes: Progressing   LCSW Treatment Plan for Primary Diagnosis: <principal problem not specified> Long Term Goal(s): Safe transition to appropriate next level of care at discharge, Engage patient in therapeutic group addressing interpersonal concerns.  Short Term Goals: Engage patient in aftercare planning with referrals and resources, Identify triggers associated with mental health/substance abuse issues and Increase skills for wellness and recovery  Therapeutic Interventions: Assess for all discharge needs, 1 to 1 time with Social worker, Explore available resources and support systems, Assess for adequacy in community support network, Educate family and significant other(s) on suicide prevention, Complete Psychosocial Assessment, Interpersonal group therapy.  Evaluation of Outcomes: Progressing   Progress in Treatment: Attending groups: Yes. Participating in groups: Yes. Taking medication as prescribed: Yes. Toleration medication: Yes. Family/Significant other contact made: No, will contact:    Patient understands diagnosis: Yes. Discussing patient identified problems/goals with staff: Yes. Medical problems stabilized or resolved: Yes. Denies suicidal/homicidal ideation: Yes. Issues/concerns per patient self-inventory: Yes. Other:    New problem(s) identified: No, Describe:     New Short Term/Long Term Goal(s):  Discharge Plan or Barriers:   Reason for Continuation of Hospitalization: Anxiety Hallucinations Medication stabilization  Coordination of After care  Estimated Length of Stay: 5-7days  Recreational Therapy: Patient Stressors: Family   Patient Goal: Patient will engage in groups without prompting or encouragement from LRT x3 group sessions within 5 recreation therapy group sessions  Attendees: Patient:Erica Mueller 10/16/2017 12:48 PM  Physician:  Corinna Gab, MD 10/16/2017 12:48 PM  Nursing: Hulan Amato, RN 10/16/2017 12:48 PM  RN Care Manager: 10/16/2017 12:48 PM  Social Worker: Jake Shark, LCSW 10/16/2017 12:48 PM  Recreational Therapist: Garret Reddish, LRT 10/16/2017 12:48 PM  Other: Heidi Dach, LCSW 10/16/2017 12:48 PM  Other: Johny Shears, LCSWA 10/16/2017 12:48 PM  Other: 10/16/2017 12:48 PM    Scribe for Treatment Team: Glennon Mac, LCSW 10/16/2017 12:48 PM

## 2017-10-16 NOTE — B[facility] Suicide Risk Assessment (Signed)
[facility] Admission Suicide Risk Assessment   Nursing information obtained from:    Demographic factors:   67 yo female, lving independantly Current Mental Status:   delusional Loss Factors:    Historical Factors:   long history of schizophrenia Risk Reduction Factors:   no suicidal thoguhts  Total Time spent with patient: 45 minutes Principal Problem: Schizophrenia (HCC) Diagnosis:   Patient Active Problem List   Diagnosis Date Noted  . Schizophrenia (HCC) [F20.9] 05/27/2017    Priority: High  . Schizoaffective disorder, depressive type (HCC) [F25.1] 04/02/2017  . Diabetes mellitus without complication (HCC) [E11.9] 04/02/2017  . Hypertension [I10] 04/02/2017  . Noncompliance [Z91.19] 04/02/2017   Subjective Data: See H&P  Continued Clinical Symptoms:  Alcohol Use Disorder Identification Test Final Score (AUDIT): 0 The "Alcohol Use Disorders Identification Test", Guidelines for Use in Primary Care, Second Edition.  World Science writer [facility]). Score between 0-7:  no or low risk or alcohol related problems. Score between 8-15:  moderate risk of alcohol related problems. Score between 16-19:  high risk of alcohol related problems. Score 20 or above:  warrants further diagnostic evaluation for alcohol dependence and treatment.   CLINICAL FACTORS:   Schizophrenia:   Paranoid or undifferentiated type     COGNITIVE FEATURES THAT CONTRIBUTE TO RISK:  Polarized thinking    SUICIDE RISK:   Minimal: No identifiable suicidal ideation.   PLAN OF CARE: See H&P  I certify that inpatient services furnished can reasonably be expected to improve the patient's condition.   Haskell Riling, MD 10/16/2017, 3:36 PM

## 2017-10-16 NOTE — Plan of Care (Signed)
Patient has been out in the milieu and attending unit groups when appropriate for her throughout the day. Patient denies SI/HI/AVH as well as any signs/symptoms of depression/anxiety at this time. Patient verbalizes understanding of her prescribed therapeutic regimen and has refused some medications and CBG monitoring today. Patient has the ability to cope and she has been working towards achieving adequate nutrition. Patient has been interacting well with other members on the unit and has the ability to redirect anger and hostility. Patient has the ability to participate in self-care. Patient has verbalized understanding of the general information that's been provided to her and she states that she slept "ok" last night. Patient has demonstrated self-control on the unit and been working to maintain her clinical measurements within normal limits. Patient has remained free from any health-related complications as well as an injury thus far. Patient's risk for impaired skin integrity has decreased and patient remains safe on the unit at this time.  Problem: Activity: Goal: Will verbalize the importance of balancing activity with adequate rest periods Outcome: Progressing   Problem: Education: Goal: Will be free of psychotic symptoms Outcome: Progressing Goal: Knowledge of the prescribed therapeutic regimen will improve Outcome: Progressing   Problem: Coping: Goal: Coping ability will improve Outcome: Progressing Goal: Will verbalize feelings Outcome: Progressing   Problem: Health Behavior/Discharge Planning: Goal: Compliance with prescribed medication regimen will improve Outcome: Progressing   Problem: Nutritional: Goal: Ability to achieve adequate nutritional intake will improve Outcome: Progressing   Problem: Role Relationship: Goal: Ability to communicate needs accurately will improve Outcome: Progressing Goal: Ability to interact with others will improve Outcome: Progressing    Problem: Safety: Goal: Ability to redirect hostility and anger into socially appropriate behaviors will improve Outcome: Progressing Goal: Ability to remain free from injury will improve Outcome: Progressing   Problem: Self-Care: Goal: Ability to participate in self-care as condition permits will improve Outcome: Progressing   Problem: Self-Concept: Goal: Will verbalize positive feelings about self Outcome: Progressing   Problem: Education: Goal: Knowledge of [facility] Education information/materials will improve Outcome: Progressing Goal: Emotional status will improve Outcome: Progressing Goal: Mental status will improve Outcome: Progressing Goal: Verbalization of understanding the information provided will improve Outcome: Progressing   Problem: Activity: Goal: Interest or engagement in activities will improve Outcome: Progressing Goal: Sleeping patterns will improve Outcome: Progressing   Problem: Coping: Goal: Ability to verbalize frustrations and anger appropriately will improve Outcome: Progressing Goal: Ability to demonstrate self-control will improve Outcome: Progressing   Problem: Health Behavior/Discharge Planning: Goal: Identification of resources available to assist in meeting health care needs will improve Outcome: Progressing Goal: Compliance with treatment plan for underlying cause of condition will improve Outcome: Progressing   Problem: Physical Regulation: Goal: Ability to maintain clinical measurements within normal limits will improve Outcome: Progressing   Problem: Safety: Goal: Periods of time without injury will increase Outcome: Progressing   Problem: Activity: Goal: Will identify at least one activity in which they can participate Outcome: Progressing   Problem: Coping: Goal: Ability to identify and develop effective coping behavior will improve Outcome: Progressing Goal: Ability to interact with others will  improve Outcome: Progressing Goal: Demonstration of participation in decision-making regarding own care will improve Outcome: Progressing Goal: Ability to use eye contact when communicating with others will improve Outcome: Progressing   Problem: Health Behavior/Discharge Planning: Goal: Identification of resources available to assist in meeting health care needs will improve Outcome: Progressing   Problem: Self-Concept: Goal: Will verbalize positive feelings about self  Outcome: Progressing   Problem: Education: Goal: Ability to describe self-care measures that may prevent or decrease complications (Diabetes Survival Skills Education) will improve Outcome: Progressing   Problem: Coping: Goal: Ability to adjust to condition or change in health will improve Outcome: Progressing   Problem: Fluid Volume: Goal: Ability to maintain a balanced intake and output will improve Outcome: Progressing   Problem: Health Behavior/Discharge Planning: Goal: Ability to identify and utilize available resources and services will improve Outcome: Progressing Goal: Ability to manage health-related needs will improve Outcome: Progressing   Problem: Metabolic: Goal: Ability to maintain appropriate glucose levels will improve Outcome: Progressing   Problem: Nutritional: Goal: Maintenance of adequate nutrition will improve Outcome: Progressing Goal: Progress toward achieving an optimal weight will improve Outcome: Progressing   Problem: Skin Integrity: Goal: Risk for impaired skin integrity will decrease Outcome: Progressing   Problem: Tissue Perfusion: Goal: Adequacy of tissue perfusion will improve Outcome: Progressing
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
## 2017-10-17 LAB — GLUCOSE, CAPILLARY
GLUCOSE-CAPILLARY: 159 mg/dL — AB (ref 65–99)
GLUCOSE-CAPILLARY: 292 mg/dL — AB (ref 65–99)
Glucose-Capillary: 224 mg/dL — ABNORMAL HIGH (ref 65–99)
Glucose-Capillary: 142 mg/dL — ABNORMAL HIGH (ref 65–99)

## 2017-10-17 MED ORDER — RISPERIDONE 1 MG PO TABS
1.0000 mg | ORAL_TABLET | ORAL | Status: DC
  Administered 2017-10-17 – 2017-10-22 (×6): 1 mg via ORAL
  Filled 2017-10-17 (×6): qty 1

## 2017-10-17 MED ORDER — PALIPERIDONE PALMITATE ER 234 MG/1.5ML IM SUSY
234.0000 mg | PREFILLED_SYRINGE | Freq: Once | INTRAMUSCULAR | Status: AC
  Administered 2017-10-18: 234 mg via INTRAMUSCULAR
  Filled 2017-10-17: qty 1.5

## 2017-10-17 MED ORDER — LORAZEPAM 1 MG PO TABS
1.0000 mg | ORAL_TABLET | Freq: Four times a day (QID) | ORAL | Status: DC | PRN
  Filled 2017-10-17: qty 2

## 2017-10-17 MED ORDER — LORAZEPAM 1 MG PO TABS
1.0000 mg | ORAL_TABLET | Freq: Once | ORAL | Status: AC
  Administered 2017-10-17: 1 mg via ORAL
  Filled 2017-10-17: qty 1

## 2017-10-17 MED ORDER — PALIPERIDONE PALMITATE ER 234 MG/1.5ML IM SUSY: 234.0000 mg | PREFILLED_SYRINGE | Freq: Once | INTRAMUSCULAR | Status: DC

## 2017-10-17 MED ORDER — LORAZEPAM 0.5 MG PO TABS
0.5000 mg | ORAL_TABLET | Freq: Four times a day (QID) | ORAL | Status: DC | PRN
  Administered 2017-10-18 – 2017-10-19 (×2): 0.5 mg via ORAL
  Filled 2017-10-17 (×3): qty 1

## 2017-10-17 MED ORDER — RISPERIDONE 1 MG PO TABS: 2.0000 mg | ORAL_TABLET | Freq: Every day | ORAL | Status: DC
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
## 2017-10-17 NOTE — Progress Notes (Signed)
Recreation Therapy Notes   Date: 10/17/2017  Time: 9:30 am   Location: Craft Room   Behavioral response: N/A   Intervention Topic: Coping Skills  Discussion/Intervention: Patient did not attend group.   Clinical Observations/Feedback:  Patient did not attend group.   Tiearra Colwell LRT/CTRS        Lemond Griffee 10/17/2017 12:07 PM

## 2017-10-17 NOTE — Progress Notes (Signed)
Inpatient Diabetes Program Recommendations  AACE/ADA: New Consensus Statement on Inpatient Glycemic Control (2015)  Target Ranges:  Prepandial:   less than 140 mg/dL      Peak postprandial:   less than 180 mg/dL (1-2 hours)      Critically ill patients:  140 - 180 mg/dL   Lab Results  Component Value Date   GLUCAP 142 (H) 10/17/2017   HGBA1C 8.9 (H) 10/16/2017    Review of Glycemic Control  Diabetes history: DM 2 Outpatient Diabetes Medications: Metformin 500 mg bid Current orders for Inpatient glycemic control: Novolog Sensitive Correction 0-9 units tid, Metformin 1000 mg bid, Actos 15 mg Daily  Inpatient Diabetes Program Recommendations:    Glucose trends were elevated, Actos added today. Lunchtime CBG down in the 140's will monitor trends.  Thanks,  Christena Deem RN, MSN, BC-ADM, [facility] Inpatient Diabetes Coordinator Team Pager [phone] (8a-5p)

## 2017-10-17 NOTE — [facility] Group Notes (Signed)
LCSW Group Therapy Note 10/17/2017 9:00 AM  Type of Therapy and Topic:  Group Therapy:  Setting Goals  Participation Level:  Did Not Attend  Description of Group: In this process group, patients discussed using strengths to work toward goals and address challenges.  Patients identified two positive things about themselves and one goal they were working on.  Patients were given the opportunity to share openly and support each other's plan for self-empowerment.  The group discussed the value of gratitude and were encouraged to have a daily reflection of positive characteristics or circumstances.  Patients were encouraged to identify a plan to utilize their strengths to work on current challenges and goals.  Therapeutic Goals 1. Patient will verbalize personal strengths/positive qualities and relate how these can assist with achieving desired personal goals 2. Patients will verbalize affirmation of peers plans for personal change and goal setting 3. Patients will explore the value of gratitude and positive focus as related to successful achievement of goals 4. Patients will verbalize a plan for regular reinforcement of personal positive qualities and circumstances.  Summary of Patient Progress: Berlene was invited to today's group, but chose not to attend.      Therapeutic Modalities Cognitive Behavioral Therapy Motivational Interviewing    Alease Frame, [place] 10/17/2017 10:32 AM

## 2017-10-17 NOTE — [facility] Group Notes (Signed)
[facility] Group Notes:  (Nursing/MHT/Case Management/Adjunct)  Date:  10/17/2017  Time:  12:27 AM  Type of Therapy:  Group Therapy  Participation Level:  Did Not Attend  P Mayra Neer 10/17/2017, 12:27 AM

## 2017-10-17 NOTE — [facility] Group Notes (Signed)
  10/17/2017  Time: 1PM  Type of Therapy/Topic:  Group Therapy:  Balance in Life  Participation Level:  Did Not Attend  Description of Group:   This group will address the concept of balance and how it feels and looks when one is unbalanced. Patients will be encouraged to process areas in their lives that are out of balance and identify reasons for remaining unbalanced. Facilitators will guide patients in utilizing problem-solving interventions to address and correct the stressor making their life unbalanced. Understanding and applying boundaries will be explored and addressed for obtaining and maintaining a balanced life. Patients will be encouraged to explore ways to assertively make their unbalanced needs known to significant others in their lives, using other group members and facilitator for support and feedback.  Therapeutic Goals: 1. Patient will identify two or more emotions or situations they have that consume much of in their lives. 2. Patient will identify signs/triggers that life has become out of balance:  3. Patient will identify two ways to set boundaries in order to achieve balance in their lives:  4. Patient will demonstrate ability to communicate their needs through discussion and/or role plays  Summary of Patient Progress: Pt was invited to attend group but chose not to attend. CSW will continue to encourage pt to attend group throughout their admission.   Therapeutic Modalities:   Cognitive Behavioral Therapy Solution-Focused Therapy Assertiveness Training  Heidi Dach, MSW, LCSW Clinical Social Worker 10/17/2017 2:02 PM

## 2017-10-17 NOTE — Progress Notes (Signed)
Patient ID: Erica Mueller, female   DOB: November 26, 1950, 67 y.o.   MRN: [id number] Patient observed frequently coming to the nurses' station during the report, she was the first patient that I engaged with and we both started the shift by a "Time Out".  Erica Mueller laughed and giggled when I humbly said I don't want to fight tonight.  Mood and affect more friendly, early medication given, CBG=234.

## 2017-10-17 NOTE — Progress Notes (Signed)
Received Erica Mueller this AM after breakfast, she was confrontational related to this writers tone of voice and her medications. She eventually took her medications and her one time dose of medications at 1000 hrs. She is constantly making requests throughout the morning.

## 2017-10-18 LAB — GLUCOSE, CAPILLARY
GLUCOSE-CAPILLARY: 92 mg/dL (ref 65–99)
GLUCOSE-CAPILLARY: 227 mg/dL — AB (ref 65–99)
Glucose-Capillary: 186 mg/dL — ABNORMAL HIGH (ref 65–99)

## 2017-10-18 MED ORDER — OLANZAPINE 10 MG IM SOLR
5.0000 mg | Freq: Four times a day (QID) | INTRAMUSCULAR | Status: DC | PRN
  Administered 2017-10-22: 5 mg via INTRAMUSCULAR
  Filled 2017-10-18 (×2): qty 10

## 2017-10-18 MED ORDER — FLUCONAZOLE 100 MG PO TABS
150.0000 mg | ORAL_TABLET | Freq: Once | ORAL | Status: AC
  Administered 2017-10-19: 150 mg via ORAL
  Filled 2017-10-18: qty 1.5

## 2017-10-18 MED ORDER — OLANZAPINE 5 MG PO TABS
5.0000 mg | ORAL_TABLET | Freq: Four times a day (QID) | ORAL | Status: DC | PRN
  Administered 2017-10-18: 5 mg via ORAL
  Filled 2017-10-18 (×2): qty 1

## 2017-10-18 NOTE — Progress Notes (Addendum)
[facility] MD Progress Note  10/18/2017 12:45 PM Erica Mueller  MRN:  [id number] Subjective:  Pt irritable and demanding this morning and is perseverative about making phone calls when the phones are off for groups. I did allow her to call her brother so she could check in on her dog which helped her. She is overall organized in conversation. She is open to getting injection of [place] today. She has not had unsafe behaviors on the unit. She is eating well and caring for her ADLs. She refuses to sign consents for ACT team until her brother can look over them.   Principal Problem: Schizophrenia (HCC) Diagnosis:   Patient Active Problem List   Diagnosis Date Noted  . Schizophrenia (HCC) [F20.9] 05/27/2017    Priority: High  . Schizoaffective disorder, depressive type (HCC) [F25.1] 04/02/2017  . Diabetes mellitus without complication (HCC) [E11.9] 04/02/2017  . Hypertension [I10] 04/02/2017  . Noncompliance [Z91.19] 04/02/2017   Total Time spent with patient: 20 minutes  Past Psychiatric History: See H&P  Past Medical History:  Past Medical History:  Diagnosis Date  . Coronary artery disease   . Depression   . Diabetes mellitus without complication (HCC)   . Elevated lipids   . GERD (gastroesophageal reflux disease)   . Hypertension   . Syphilis (acquired)     Past Surgical History:  Procedure Laterality Date  . COLONOSCOPY    . COLONOSCOPY WITH PROPOFOL N/A 09/01/2015   Procedure: COLONOSCOPY WITH PROPOFOL;  Surgeon: Wallace Cullens, MD;  Location: [facility] ENDOSCOPY;  Service: Gastroenterology;  Laterality: N/A;  . TUBAL LIGATION    . wisdom teeth removal     Family History: History reviewed. No pertinent family history. Family Psychiatric  History: See H&P Social History:  Social History   Substance and Sexual Activity  Alcohol Use Yes   Comment: occassionally     Social History   Substance and Sexual Activity  Drug Use No    Social History   Socioeconomic History  . Marital  status: Divorced    Spouse name: Not on file  . Number of children: Not on file  . Years of education: Not on file  . Highest education level: Not on file  Occupational History  . Not on file  Social Needs  . Financial resource strain: Not on file  . Food insecurity:    Worry: Not on file    Inability: Not on file  . Transportation needs:    Medical: Not on file    Non-medical: Not on file  Tobacco Use  . Smoking status: Current Every Day Smoker    Packs/day: 1.00    Types: Cigarettes  . Smokeless tobacco: Never Used  Substance and Sexual Activity  . Alcohol use: Yes    Comment: occassionally  . Drug use: No  . Sexual activity: Not on file  Lifestyle  . Physical activity:    Days per week: Not on file    Minutes per session: Not on file  . Stress: Not on file  Relationships  . Social connections:    Talks on phone: Not on file    Gets together: Not on file    Attends religious service: Not on file    Active member of club or organization: Not on file    Attends meetings of clubs or organizations: Not on file    Relationship status: Not on file  Other Topics Concern  . Not on file  Social History Narrative  . Not  on file   Additional Social History:                         Sleep: Fair  Appetite:  Fair  Current Medications: Current Facility-Administered Medications  Medication Dose Route Frequency Provider Last Rate Last Dose  . acetaminophen (TYLENOL) tablet 650 mg  650 mg Oral Q6H PRN Clapacs, John T, MD      . alum & mag hydroxide-simeth (MAALOX/MYLANTA) 200-200-20 MG/5ML suspension 30 mL  30 mL Oral Q4H PRN Clapacs, John T, MD      . amitriptyline (ELAVIL) tablet 25 mg  25 mg Oral QHS Tydus Sanmiguel, Ileene Hutchinson, MD   Stopped at 10/17/17 2200  . benztropine (COGENTIN) tablet 0.5 mg  0.5 mg Oral BID Clapacs, Jackquline Denmark, MD   0.5 mg at 10/18/17 0844  . insulin aspart (novoLOG) injection 0-9 Units  0-9 Units Subcutaneous TID WC Clapacs, Jackquline Denmark, MD   2 Units at  10/17/17 1634  . LORazepam (ATIVAN) tablet 0.5 mg  0.5 mg Oral Q6H PRN Celines Femia, Ileene Hutchinson, MD      . magnesium hydroxide (MILK OF MAGNESIA) suspension 30 mL  30 mL Oral Daily PRN Clapacs, John T, MD      . metFORMIN (GLUCOPHAGE) tablet 500 mg  500 mg Oral BID WC Clapacs, Jackquline Denmark, MD   500 mg at 10/18/17 0844  . nicotine (NICODERM CQ - dosed in mg/24 hours) patch 21 mg  21 mg Transdermal Daily Clapacs, Jackquline Denmark, MD   21 mg at 10/18/17 0843  . pioglitazone (ACTOS) tablet 15 mg  15 mg Oral Daily Clapacs, Jackquline Denmark, MD   15 mg at 10/18/17 0844  . pravastatin (PRAVACHOL) tablet 20 mg  20 mg Oral q1800 Clapacs, Jackquline Denmark, MD   20 mg at 10/17/17 1803  . risperiDONE (RISPERDAL) tablet 1 mg  1 mg Oral BH-q7a Sonoma Firkus, Ileene Hutchinson, MD   1 mg at 10/18/17 1610  . risperiDONE (RISPERDAL) tablet 2 mg  2 mg Oral QHS Owain Eckerman, Ileene Hutchinson, MD   Stopped at 10/17/17 2200    Lab Results:  Results for orders placed or performed during the hospital encounter of 10/16/17 (from the past 48 hour(s))  Glucose, capillary     Status: Abnormal   Collection Time: 10/16/17  7:51 PM  Result Value Ref Range   Glucose-Capillary 234 (H) 65 - 99 mg/dL  Glucose, capillary     Status: Abnormal   Collection Time: 10/17/17  7:13 AM  Result Value Ref Range   Glucose-Capillary 224 (H) 65 - 99 mg/dL   Comment 1 Notify RN   Glucose, capillary     Status: Abnormal   Collection Time: 10/17/17 11:34 AM  Result Value Ref Range   Glucose-Capillary 142 (H) 65 - 99 mg/dL   Comment 1 Notify RN   Glucose, capillary     Status: Abnormal   Collection Time: 10/17/17  4:25 PM  Result Value Ref Range   Glucose-Capillary 159 (H) 65 - 99 mg/dL   Comment 1 Notify RN   Glucose, capillary     Status: Abnormal   Collection Time: 10/17/17  7:51 PM  Result Value Ref Range   Glucose-Capillary 292 (H) 65 - 99 mg/dL  Glucose, capillary     Status: Abnormal   Collection Time: 10/18/17 11:10 AM  Result Value Ref Range   Glucose-Capillary 186 (H) 65 - 99 mg/dL    Blood  Alcohol level:  Lab Results  Component  Value Date   ETH <10 10/15/2017   ETH <10 10/01/2017    Metabolic Disorder Labs: Lab Results  Component Value Date   HGBA1C 8.9 (H) 10/16/2017   MPG 208.73 10/16/2017   MPG 143 05/27/2017   No results found for: PROLACTIN Lab Results  Component Value Date   CHOL 136 10/16/2017   TRIG 90 10/16/2017   HDL 56 10/16/2017   CHOLHDL 2.4 10/16/2017   VLDL 18 10/16/2017   LDLCALC 62 10/16/2017   LDLCALC 62 05/27/2017    Physical Findings: AIMS: Facial and Oral Movements Muscles of Facial Expression: Moderate Lips and Perioral Area: Moderate Jaw: Moderate Tongue: Moderate,Extremity Movements Upper (arms, wrists, hands, fingers): Moderate Lower (legs, knees, ankles, toes): Mild, Trunk Movements Neck, shoulders, hips: Mild, Overall Severity Severity of abnormal movements (highest score from questions above): Moderate Incapacitation due to abnormal movements: Mild Patient's awareness of abnormal movements (rate only patient's report): Aware, mild distress, Dental Status Current problems with teeth and/or dentures?: Yes Does patient usually wear dentures?: No  CIWA:    COWS:     Musculoskeletal: Strength & Muscle Tone: within normal limits Gait & Station: normal Patient leans: N/A  Psychiatric Specialty Exam: Physical Exam  Nursing note and vitals reviewed.   Review of Systems  All other systems reviewed and are negative.   Blood pressure (!) 146/65, pulse (!) 107, temperature 97.6 F (36.4 C), temperature source Oral, resp. rate 16, height 5\' 2"  (1.575 m), weight 68 kg (150 lb), SpO2 99 %.Body mass index is 27.44 kg/m.  General Appearance: Casual, very poor dentition, mouth writhing  Eye Contact:  Fair  Speech:  Garbled  Volume:  Normal  Mood:  Irritable  Affect:  Labile  Thought Process:  Coherent  Orientation:  Full (Time, Place, and Person)  Thought Content:  Paranoid Ideation  Suicidal Thoughts:  No  Homicidal  Thoughts:  No  Memory:  Immediate;   Poor  Judgement:  Impaired  Insight:  Lacking  Psychomotor Activity:  Normal  Concentration:  Concentration: Fair  Recall:  [employer] of Knowledge:  Fair  Language:  Fair  Akathisia:  No      Assets:  Resilience  ADL's:  Intact  Cognition:  Impaired,  Mild  Sleep:  Number of Hours: 8     Treatment Plan Summary: 67 yo female admitted due to paranoia and delusions. She is still paranoid about people going into her house and stealing things. She has been very needy and demanding but no unsafe behaviors on the unit. She is paranoid and will not sign any consents to refer to ACT team until her brother can look at them. She did agree to starting [place] and was given initial injection today. She reports some vaginal itching. Possible yeast infection.   Plan:  Schizophrenia -She was given initial dose of [place] this morning. Will give next dose next week. Continue oral Risperdal for now -Cogentin was stopped yesterday due to confusion and can also exacerbate TD  Confusion -Better today, UA has not been collected yet -likely mild cognitive impairment  Insomnia -Elavil 25 mg qhs  DM -Continue Actos 15 mg daily -Continue Metformin 500 mg BID -SS insulin  Vaginal itching-possible yeast infection -Will treat with single dose of diflucan 150 mg  Dispo -Pt has not signed consents for ACTT referral yet due to paranoia. Will try to have nursing staff get these signed if her brother visist, which pt prefers. She is very open to ACT referral  Haskell Riling, MD 10/18/2017, 12:45 PM

## 2017-10-18 NOTE — Progress Notes (Signed)
Recreation Therapy Notes  Date: 10/18/2017  Time: 9:30 am   Location: Craft Room   Behavioral response: N/A   Intervention Topic: Leisure  Discussion/Intervention: Patient did not attend group.   Clinical Observations/Feedback:  Patient did not attend group.   Sunnie Odden LRT/CTRS        Vashon Arch 10/18/2017 10:28 AM 

## 2017-10-18 NOTE — Progress Notes (Signed)
D:Pt denies SI/HI/AVH. Pt is overall pleasant and cooperative, but can be very intrusive and attention-seeking. Pt. Is redirectable. Pt. Observed participating in snacks this evening appropriately, but continues to need DM eduction on appropriate snack choices.  Pt. has no complaints. Pt. Appears disheveled. Pt. Verbally is able to contract for safety. Pt. Reports she can remain safe while on the unit.   A: Q x 15 minute observation checks were completed for safety. Patient was provided with education. Pt. Verbalizes understanding education provided. Patient was given medications. Patient  was encourage to attend groups, participate in unit activities and continue with plan of care. Pt. Plans of care and chart reviewed. Pt. Given support and encouragement. Pt. Blood sugars monitored for safety.   R:Patient is complaint with medication and unit procedures. Pt. Attempted to comply with giving UDS sampling, but was unable to provided an adequate size sample in order for testing to be ran by lab. Will continue to encourage compliance and will notify day shift of continued need for UDS per MD orders.             Precautionary checks every 15 minutes for safety maintained, room free of safety hazards, patient sustains no injury or falls during this shift.

## 2017-10-18 NOTE — Progress Notes (Signed)
Received Erica Mueller this AM after breakfast,she was compliant could nowith her medications and accepted her [place] injection. She verbalized hearing voices and described it as music. She was calm this AM and decided to take a nap. She was asked to wait until the staff could assist her with her personal belonging.Then she got upset,cursing and smearing secretions from her vagina and rectum on the nurses station glass. She was medicated and eventually calmed down and took a nap. She woke up in a better mood.
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
## 2017-10-18 NOTE — Plan of Care (Signed)
Pt. Verbalizes understanding of provided education. Pt. Compliant with medications and unit procedures. Pt. Reports she can remain safe while on the unit. Pt. Denies SI/HI. Pt. Verbally is able to contract for safety.    Problem: Education: Goal: Knowledge of the prescribed therapeutic regimen will improve Outcome: Progressing   Problem: Health Behavior/Discharge Planning: Goal: Compliance with prescribed medication regimen will improve Outcome: Progressing   Problem: Safety: Goal: Ability to remain free from injury will improve Outcome: Progressing   Problem: Education: Goal: Knowledge of [facility] Education information/materials will improve Outcome: Progressing   Problem: Safety: Goal: Ability to remain free from injury will improve Outcome: Progressing

## 2017-10-18 NOTE — Plan of Care (Signed)
Patient slept for Estimated Hours of 8; Precautionary checks every 15 minutes for safety maintained, room free of safety hazards, patient sustains no injury or falls during this shift.  Problem: Safety: Goal: Ability to remain free from injury will improve Outcome: Progressing   Problem: Activity: Goal: Sleeping patterns will improve Outcome: Progressing

## 2017-10-18 NOTE — Progress Notes (Signed)
Patient ID: Erica Mueller, female   DOB: 1950/07/09, 67 y.o.   MRN: [id number] CSW met with pt to attempt to obtain a consent for [place] UCP in order to send a referral for ACT services as well as for a support person who could be contacted and provided with SPE as well as discuss any concerns.  Pt continues to be very paranoid and suspicious.  She took the consent forms and appeared to be reading over them then informed CSW that she would like for her brother, Alvester Chou, to review the consents before she signs them.  CSW informed pt's psychiatrist, Dr. Wonda Olds, of pt's refusal to sign the consents.  Dr. Wonda Olds informed CSW that she would talk with pt when she met with her today about the need to at least sign the consent for [place] if she wants a referral for ACT services.  Dr. Wonda Olds met with CSW after she met with pt and informed CSW that pt would not sign the consents and informed her as well that she wanted her brother to review the documents.  The consents were left in pt's medical record with Dr. Wonda Olds asking pt's RN to give them to pt's brother if he comes to visit pt.

## 2017-10-18 NOTE — [facility] Group Notes (Signed)

## 2017-10-19 DIAGNOSIS — L292 Pruritus vulvae: Secondary | ICD-10-CM

## 2017-10-19 DIAGNOSIS — Z79899 Other long term (current) drug therapy: Secondary | ICD-10-CM

## 2017-10-19 DIAGNOSIS — E119 Type 2 diabetes mellitus without complications: Secondary | ICD-10-CM

## 2017-10-19 DIAGNOSIS — Z794 Long term (current) use of insulin: Secondary | ICD-10-CM

## 2017-10-19 DIAGNOSIS — F17210 Nicotine dependence, cigarettes, uncomplicated: Secondary | ICD-10-CM

## 2017-10-19 DIAGNOSIS — R410 Disorientation, unspecified: Secondary | ICD-10-CM

## 2017-10-19 DIAGNOSIS — G4700 Insomnia, unspecified: Secondary | ICD-10-CM

## 2017-10-19 LAB — URINALYSIS, COMPLETE (UACMP) WITH MICROSCOPIC
Bilirubin Urine: NEGATIVE
Glucose, UA: 50 mg/dL — AB
HGB URINE DIPSTICK: NEGATIVE
Ketones, ur: NEGATIVE mg/dL
Nitrite: NEGATIVE
PROTEIN: NEGATIVE mg/dL
SPECIFIC GRAVITY, URINE: 1.003 — AB (ref 1.005–1.030)
pH: 7 (ref 5.0–8.0)

## 2017-10-19 LAB — GLUCOSE, CAPILLARY
GLUCOSE-CAPILLARY: 188 mg/dL — AB (ref 65–99)
GLUCOSE-CAPILLARY: 207 mg/dL — AB (ref 65–99)
Glucose-Capillary: 97 mg/dL (ref 65–99)
Glucose-Capillary: 190 mg/dL — ABNORMAL HIGH (ref 65–99)

## 2017-10-19 NOTE — [facility] Group Notes (Signed)
LCSW Group Therapy Note  10/19/2017 1:15pm  Type of Therapy and Topic:  Group Therapy:  Cognitive Distortions  Participation Level:  Did Not Attend   Description of Group:    Patients in this group will be introduced to the topic of cognitive distortions.  Patients will identify and describe cognitive distortions, describe the feelings these distortions create for them.  Patients will identify one or more situations in their personal life where they have cognitively distorted thinking and will verbalize challenging this cognitive distortion through positive thinking skills.  Patients will practice the skill of using positive affirmations to challenge cognitive distortions using affirmation cards.    Therapeutic Goals:  1. Patient will identify two or more cognitive distortions they have used 2. Patient will identify one or more emotions that stem from use of a cognitive distortion 3. Patient will demonstrate use of a positive affirmation to counter a cognitive distortion through discussion and/or role play. 4. Patient will describe one way cognitive distortions can be detrimental to wellness   Summary of Patient Progress: The patient was invited to group but did not attend.      Therapeutic Modalities:   Cognitive Behavioral Therapy Motivational Interviewing   Shanan Fitzpatrick  CUEBAS-COLON, LCSW 10/19/2017 12:16 PM   

## 2017-10-19 NOTE — Progress Notes (Signed)
Patient is agitated and needy for most of shift. Patient is frequently at nurses station angry requesting to go home. Patient is hypersexual at times. Patient is seen with hands down pants. Patient is redirected. Patient is given PRN medication and is responsible. Patient denies SI/HI/AVH. Patient denies Anxiety and depression.

## 2017-10-19 NOTE — [facility] Group Notes (Signed)
[facility] Group Notes:  (Nursing/MHT/Case Management/Adjunct)  Date:  10/19/2017  Time:  9:46 PM  Type of Therapy:  Group Therapy  Participation Level:  Active  Participation Quality:  Sharing  Affect:  Appropriate  Cognitive:  Alert  Insight:  Good  Engagement in Group:  Engaged  Modes of Intervention:  Limit-setting  Summary of Progress/Problems:  Erica Mueller 10/19/2017, 9:46 PM

## 2017-10-19 NOTE — Progress Notes (Addendum)
[facility] MD Progress Note  10/19/2017 1:49 PM Erica Mueller  MRN:  [id number] Subjective: Asks me if I can take a look at her medications and restart her on nevane.  Says she was on this medication for a long time and as long as she can remember.  Was taking it twice daily.  Says I can call her doctor in [place] and ask about this medication.  Denies any symptoms suggestive of auditory or visual hallucinations.  Denies any referential ideas.  Does report that people are out to get her.  Principal Problem: Schizophrenia (HCC) Diagnosis:   Patient Active Problem List   Diagnosis Date Noted  . Schizophrenia (HCC) [F20.9] 05/27/2017  . Schizoaffective disorder, depressive type (HCC) [F25.1] 04/02/2017  . Diabetes mellitus without complication (HCC) [E11.9] 04/02/2017  . Hypertension [I10] 04/02/2017  . Noncompliance [Z91.19] 04/02/2017   Total Time spent with patient: 20 minutes  Past Psychiatric History: See H&P  Past Medical History:  Past Medical History:  Diagnosis Date  . Coronary artery disease   . Depression   . Diabetes mellitus without complication (HCC)   . Elevated lipids   . GERD (gastroesophageal reflux disease)   . Hypertension   . Syphilis (acquired)     Past Surgical History:  Procedure Laterality Date  . COLONOSCOPY    . COLONOSCOPY WITH PROPOFOL N/A 09/01/2015   Procedure: COLONOSCOPY WITH PROPOFOL;  Surgeon: Wallace Cullens, MD;  Location: [facility] ENDOSCOPY;  Service: Gastroenterology;  Laterality: N/A;  . TUBAL LIGATION    . wisdom teeth removal     Family History: History reviewed. No pertinent family history. Family Psychiatric  History: See H&P Social History:  Social History   Substance and Sexual Activity  Alcohol Use Yes   Comment: occassionally     Social History   Substance and Sexual Activity  Drug Use No    Social History   Socioeconomic History  . Marital status: Divorced    Spouse name: Not on file  . Number of children: Not on file  . Years of  education: Not on file  . Highest education level: Not on file  Occupational History  . Not on file  Social Needs  . Financial resource strain: Not on file  . Food insecurity:    Worry: Not on file    Inability: Not on file  . Transportation needs:    Medical: Not on file    Non-medical: Not on file  Tobacco Use  . Smoking status: Current Every Day Smoker    Packs/day: 1.00    Types: Cigarettes  . Smokeless tobacco: Never Used  Substance and Sexual Activity  . Alcohol use: Yes    Comment: occassionally  . Drug use: No  . Sexual activity: Not on file  Lifestyle  . Physical activity:    Days per week: Not on file    Minutes per session: Not on file  . Stress: Not on file  Relationships  . Social connections:    Talks on phone: Not on file    Gets together: Not on file    Attends religious service: Not on file    Active member of club or organization: Not on file    Attends meetings of clubs or organizations: Not on file    Relationship status: Not on file  Other Topics Concern  . Not on file  Social History Narrative  . Not on file   Additional Social History:  Sleep: Fair  Appetite:  Fair  Current Medications: Current Facility-Administered Medications  Medication Dose Route Frequency Provider Last Rate Last Dose  . acetaminophen (TYLENOL) tablet 650 mg  650 mg Oral Q6H PRN Clapacs, John T, MD      . alum & mag hydroxide-simeth (MAALOX/MYLANTA) 200-200-20 MG/5ML suspension 30 mL  30 mL Oral Q4H PRN Clapacs, John T, MD      . amitriptyline (ELAVIL) tablet 25 mg  25 mg Oral QHS McNew, Ileene Hutchinson, MD   25 mg at 10/18/17 2105  . insulin aspart (novoLOG) injection 0-9 Units  0-9 Units Subcutaneous TID WC Clapacs, Jackquline Denmark, MD   3 Units at 10/19/17 1120  . LORazepam (ATIVAN) tablet 0.5 mg  0.5 mg Oral Q6H PRN Haskell Riling, MD   0.5 mg at 10/19/17 0906  . magnesium hydroxide (MILK OF MAGNESIA) suspension 30 mL  30 mL Oral Daily PRN Clapacs,  John T, MD      . metFORMIN (GLUCOPHAGE) tablet 500 mg  500 mg Oral BID WC Clapacs, John T, MD   500 mg at 10/19/17 0837  . nicotine (NICODERM CQ - dosed in mg/24 hours) patch 21 mg  21 mg Transdermal Daily Clapacs, Jackquline Denmark, MD   21 mg at 10/19/17 0841  . OLANZapine (ZYPREXA) injection 5 mg  5 mg Intramuscular Q6H PRN McNew, Holly R, MD      . OLANZapine (ZYPREXA) tablet 5 mg  5 mg Oral Q6H PRN McNew, Ileene Hutchinson, MD   5 mg at 10/18/17 1357  . pioglitazone (ACTOS) tablet 15 mg  15 mg Oral Daily Clapacs, Jackquline Denmark, MD   15 mg at 10/19/17 0837  . pravastatin (PRAVACHOL) tablet 20 mg  20 mg Oral q1800 Clapacs, Jackquline Denmark, MD   20 mg at 10/18/17 1642  . risperiDONE (RISPERDAL) tablet 1 mg  1 mg Oral BH-q7a McNew, Ileene Hutchinson, MD   1 mg at 10/19/17 7473  . risperiDONE (RISPERDAL) tablet 2 mg  2 mg Oral QHS McNew, Ileene Hutchinson, MD   2 mg at 10/18/17 2105    Lab Results:  Results for orders placed or performed during the hospital encounter of 10/16/17 (from the past 48 hour(s))  Glucose, capillary     Status: Abnormal   Collection Time: 10/17/17  4:25 PM  Result Value Ref Range   Glucose-Capillary 159 (H) 65 - 99 mg/dL   Comment 1 Notify RN   Glucose, capillary     Status: Abnormal   Collection Time: 10/17/17  7:51 PM  Result Value Ref Range   Glucose-Capillary 292 (H) 65 - 99 mg/dL  Glucose, capillary     Status: Abnormal   Collection Time: 10/18/17 11:10 AM  Result Value Ref Range   Glucose-Capillary 186 (H) 65 - 99 mg/dL  Glucose, capillary     Status: None   Collection Time: 10/18/17  4:41 PM  Result Value Ref Range   Glucose-Capillary 92 65 - 99 mg/dL  Glucose, capillary     Status: Abnormal   Collection Time: 10/18/17  8:59 PM  Result Value Ref Range   Glucose-Capillary 227 (H) 65 - 99 mg/dL  Glucose, capillary     Status: Abnormal   Collection Time: 10/19/17  7:07 AM  Result Value Ref Range   Glucose-Capillary 188 (H) 65 - 99 mg/dL   Comment 1 Notify RN   Glucose, capillary     Status: Abnormal    Collection Time: 10/19/17 11:19 AM  Result Value Ref Range  Glucose-Capillary 207 (H) 65 - 99 mg/dL    Blood Alcohol level:  Lab Results  Component Value Date   ETH <10 10/15/2017   ETH <10 10/01/2017    Metabolic Disorder Labs: Lab Results  Component Value Date   HGBA1C 8.9 (H) 10/16/2017   MPG 208.73 10/16/2017   MPG 143 05/27/2017   No results found for: PROLACTIN Lab Results  Component Value Date   CHOL 136 10/16/2017   TRIG 90 10/16/2017   HDL 56 10/16/2017   CHOLHDL 2.4 10/16/2017   VLDL 18 10/16/2017   LDLCALC 62 10/16/2017   LDLCALC 62 05/27/2017    Physical Findings: AIMS: Facial and Oral Movements Muscles of Facial Expression: Moderate Lips and Perioral Area: Moderate Jaw: Moderate Tongue: Moderate,Extremity Movements Upper (arms, wrists, hands, fingers): Moderate Lower (legs, knees, ankles, toes): Mild, Trunk Movements Neck, shoulders, hips: Mild, Overall Severity Severity of abnormal movements (highest score from questions above): Moderate Incapacitation due to abnormal movements: Mild Patient's awareness of abnormal movements (rate only patient's report): Aware, mild distress, Dental Status Current problems with teeth and/or dentures?: Yes Does patient usually wear dentures?: No  CIWA:    COWS:     Musculoskeletal: Strength & Muscle Tone: within normal limits Gait & Station: normal Patient leans: N/A  Psychiatric Specialty Exam: Physical Exam  Nursing note and vitals reviewed.   Review of Systems  All other systems reviewed and are negative.   Blood pressure (!) 147/67, pulse 93, temperature 97.8 F (36.6 C), temperature source Oral, resp. rate 16, height 5\' 2"  (1.575 m), weight 150 lb (68 kg), SpO2 100 %.Body mass index is 27.44 kg/m.  General Appearance: Casual, very poor dentition, mouth writhing  Eye Contact:  Fair  Speech:  Garbled  Volume:  Normal  Mood:  Irritable  Affect:  Labile  Thought Process:  Coherent  Orientation:   Full (Time, Place, and Person)  Thought Content:  Paranoid Ideation  Suicidal Thoughts:  No  Homicidal Thoughts:  No  Memory:  Immediate;   Poor  Judgement:  Impaired  Insight:  Lacking  Psychomotor Activity:  Normal  Concentration:  Concentration: Fair  Recall:  [employer] of Knowledge:  Fair  Language:  Fair  Akathisia:  No      Assets:  Resilience  ADL's:  Intact  Cognition:  Impaired,  Mild  Sleep:  Number of Hours: 7.15     Treatment Plan Summary: 67 yo female admitted due to paranoia and delusions. She is still paranoid about people going into her house and stealing things. She has been very needy and demanding but no unsafe behaviors on the unit. She is paranoid and will not sign any consents to refer to ACT team until her brother can look at them. She did agree to starting [place] and was given initial injection on 10/17/17.   Plan:  Schizophrenia -She was given initial dose of INvega Sustenna 234 mg on 10/17/17. Will give next dose next week. Continue oral Risperdal for now -Cogentin was stopped yesterday due to confusion and can also exacerbate TD  Confusion -Better today, UA collected today 10/19/17 -Does probably have some degree of cognitive decline, would need better assessment with MOCA versus SLUMS. TSH normal.  Consider doing B12, folate, RPR, HIV.  Insomnia -Elavil 25 mg qhs  DM -Continue Actos 15 mg daily -Continue Metformin 500 mg BID -SS insulin  Vaginal itching-possible yeast infection -Will treat with single dose of diflucan 150 mg  Dispo -Pt has not signed  consents for ACTT referral yet due to paranoia. Will try to have nursing staff get these signed if her brother visist, which pt prefers.   Aundria Rud, MD 10/19/2017, 1:49 PM

## 2017-10-20 LAB — GLUCOSE, CAPILLARY
Glucose-Capillary: 191 mg/dL — ABNORMAL HIGH (ref 65–99)
Glucose-Capillary: 203 mg/dL — ABNORMAL HIGH (ref 65–99)

## 2017-10-20 NOTE — Progress Notes (Signed)
D:Pt denies SI/HI/AVH. Pt. Upon assessment utilizes garbled soft speech often making it hard to understand. Pt. interactions are attention-seeking with staff and with patient's peers. Pt. Redirectable by staff. Pt. Presents disheveled and not attending to ADls or hygiene. Pt. During day shift was able to comply with UA sampling. Pt. Observed attending snacks and present in the milieu appropriately.     A: Q x 15 minute observation checks were completed for safety. Patient was provided with education. Patient was given scheduled medications. Patient  was encourage to attend groups, participate in unit activities and continue with plan of care. Pt. This evening given support and encouragement. Pt. Chart and care plans reviewed.   R:Patient is complaint with medication and unit procedures. Pt. Blood sugars monitored for safety per MD orders.             Precautionary checks every 15 minutes for safety maintained, room free of safety hazards, patient sustains no injury or falls during this shift.

## 2017-10-20 NOTE — Progress Notes (Signed)
[facility] MD Progress Note  10/20/2017 12:19 PM Erica Mueller  MRN:  [id number] Subjective: This morning patient was irritable with nursing staff.  Continues to fluctuate between transient periods of irritability and being nice.  Patient continues to endorse that she would be better off with her previous medications, would like to call 911 and inform the cops about being mistreated on the unit.  Asks repeatedly if she can use the phone.  Denies any suicidal thoughts or thoughts about wanting to harm her self.  Principal Problem: Schizophrenia (HCC) Diagnosis:   Patient Active Problem List   Diagnosis Date Noted  . Schizophrenia (HCC) [F20.9] 05/27/2017  . Schizoaffective disorder, depressive type (HCC) [F25.1] 04/02/2017  . Diabetes mellitus without complication (HCC) [E11.9] 04/02/2017  . Hypertension [I10] 04/02/2017  . Noncompliance [Z91.19] 04/02/2017   Total Time spent with patient: 20 minutes  Past Psychiatric History: See H&P  Past Medical History:  Past Medical History:  Diagnosis Date  . Coronary artery disease   . Depression   . Diabetes mellitus without complication (HCC)   . Elevated lipids   . GERD (gastroesophageal reflux disease)   . Hypertension   . Syphilis (acquired)     Past Surgical History:  Procedure Laterality Date  . COLONOSCOPY    . COLONOSCOPY WITH PROPOFOL N/A 09/01/2015   Procedure: COLONOSCOPY WITH PROPOFOL;  Surgeon: Wallace Cullens, MD;  Location: [facility] ENDOSCOPY;  Service: Gastroenterology;  Laterality: N/A;  . TUBAL LIGATION    . wisdom teeth removal     Family History: History reviewed. No pertinent family history. Family Psychiatric  History: See H&P Social History:  Social History   Substance and Sexual Activity  Alcohol Use Yes   Comment: occassionally     Social History   Substance and Sexual Activity  Drug Use No    Social History   Socioeconomic History  . Marital status: Divorced    Spouse name: Not on file  . Number of children:  Not on file  . Years of education: Not on file  . Highest education level: Not on file  Occupational History  . Not on file  Social Needs  . Financial resource strain: Not on file  . Food insecurity:    Worry: Not on file    Inability: Not on file  . Transportation needs:    Medical: Not on file    Non-medical: Not on file  Tobacco Use  . Smoking status: Current Every Day Smoker    Packs/day: 1.00    Types: Cigarettes  . Smokeless tobacco: Never Used  Substance and Sexual Activity  . Alcohol use: Yes    Comment: occassionally  . Drug use: No  . Sexual activity: Not on file  Lifestyle  . Physical activity:    Days per week: Not on file    Minutes per session: Not on file  . Stress: Not on file  Relationships  . Social connections:    Talks on phone: Not on file    Gets together: Not on file    Attends religious service: Not on file    Active member of club or organization: Not on file    Attends meetings of clubs or organizations: Not on file    Relationship status: Not on file  Other Topics Concern  . Not on file  Social History Narrative  . Not on file   Additional Social History:  Sleep: Fair  Appetite:  Fair  Current Medications: Current Facility-Administered Medications  Medication Dose Route Frequency Provider Last Rate Last Dose  . acetaminophen (TYLENOL) tablet 650 mg  650 mg Oral Q6H PRN Clapacs, John T, MD      . alum & mag hydroxide-simeth (MAALOX/MYLANTA) 200-200-20 MG/5ML suspension 30 mL  30 mL Oral Q4H PRN Clapacs, John T, MD      . amitriptyline (ELAVIL) tablet 25 mg  25 mg Oral QHS McNew, Ileene Hutchinson, MD   25 mg at 10/19/17 2115  . insulin aspart (novoLOG) injection 0-9 Units  0-9 Units Subcutaneous TID WC Clapacs, Jackquline Denmark, MD   2 Units at 10/20/17 0805  . LORazepam (ATIVAN) tablet 0.5 mg  0.5 mg Oral Q6H PRN Haskell Riling, MD   0.5 mg at 10/19/17 0906  . magnesium hydroxide (MILK OF MAGNESIA) suspension 30 mL  30 mL  Oral Daily PRN Clapacs, John T, MD      . metFORMIN (GLUCOPHAGE) tablet 500 mg  500 mg Oral BID WC Clapacs, Jackquline Denmark, MD   500 mg at 10/20/17 0805  . nicotine (NICODERM CQ - dosed in mg/24 hours) patch 21 mg  21 mg Transdermal Daily Clapacs, Jackquline Denmark, MD   21 mg at 10/20/17 0805  . OLANZapine (ZYPREXA) injection 5 mg  5 mg Intramuscular Q6H PRN McNew, Holly R, MD      . OLANZapine (ZYPREXA) tablet 5 mg  5 mg Oral Q6H PRN McNew, Ileene Hutchinson, MD   5 mg at 10/18/17 1357  . pioglitazone (ACTOS) tablet 15 mg  15 mg Oral Daily Clapacs, Jackquline Denmark, MD   15 mg at 10/20/17 0805  . pravastatin (PRAVACHOL) tablet 20 mg  20 mg Oral q1800 Clapacs, Jackquline Denmark, MD   20 mg at 10/19/17 1637  . risperiDONE (RISPERDAL) tablet 1 mg  1 mg Oral BH-q7a McNew, Ileene Hutchinson, MD   1 mg at 10/20/17 0610  . risperiDONE (RISPERDAL) tablet 2 mg  2 mg Oral QHS McNew, Ileene Hutchinson, MD   2 mg at 10/19/17 2115    Lab Results:  Results for orders placed or performed during the hospital encounter of 10/16/17 (from the past 48 hour(s))  Glucose, capillary     Status: None   Collection Time: 10/18/17  4:41 PM  Result Value Ref Range   Glucose-Capillary 92 65 - 99 mg/dL  Glucose, capillary     Status: Abnormal   Collection Time: 10/18/17  8:59 PM  Result Value Ref Range   Glucose-Capillary 227 (H) 65 - 99 mg/dL  Glucose, capillary     Status: Abnormal   Collection Time: 10/19/17  7:07 AM  Result Value Ref Range   Glucose-Capillary 188 (H) 65 - 99 mg/dL   Comment 1 Notify RN   Glucose, capillary     Status: Abnormal   Collection Time: 10/19/17 11:19 AM  Result Value Ref Range   Glucose-Capillary 207 (H) 65 - 99 mg/dL  Urinalysis, Complete w Microscopic     Status: Abnormal   Collection Time: 10/19/17 12:48 PM  Result Value Ref Range   Color, Urine STRAW (A) YELLOW   APPearance CLEAR (A) CLEAR   Specific Gravity, Urine 1.003 (L) 1.005 - 1.030   pH 7.0 5.0 - 8.0   Glucose, UA 50 (A) NEGATIVE mg/dL   Hgb urine dipstick NEGATIVE NEGATIVE    Bilirubin Urine NEGATIVE NEGATIVE   Ketones, ur NEGATIVE NEGATIVE mg/dL   Protein, ur NEGATIVE NEGATIVE mg/dL   Nitrite  NEGATIVE NEGATIVE   Leukocytes, UA SMALL (A) NEGATIVE   RBC / HPF 0-5 0 - 5 RBC/hpf   WBC, UA 0-5 0 - 5 WBC/hpf   Bacteria, UA RARE (A) NONE SEEN   Squamous Epithelial / LPF 0-5 0 - 5    Comment: Performed at [facility], [address] [place], [place] [address]  Glucose, capillary     Status: None   Collection Time: 10/19/17  4:37 PM  Result Value Ref Range   Glucose-Capillary 97 65 - 99 mg/dL  Glucose, capillary     Status: Abnormal   Collection Time: 10/19/17  9:18 PM  Result Value Ref Range   Glucose-Capillary 190 (H) 65 - 99 mg/dL  Glucose, capillary     Status: Abnormal   Collection Time: 10/20/17  7:05 AM  Result Value Ref Range   Glucose-Capillary 191 (H) 65 - 99 mg/dL   Comment 1 Notify RN     Blood Alcohol level:  Lab Results  Component Value Date   ETH <10 10/15/2017   ETH <10 10/01/2017    Metabolic Disorder Labs: Lab Results  Component Value Date   HGBA1C 8.9 (H) 10/16/2017   MPG 208.73 10/16/2017   MPG 143 05/27/2017   No results found for: PROLACTIN Lab Results  Component Value Date   CHOL 136 10/16/2017   TRIG 90 10/16/2017   HDL 56 10/16/2017   CHOLHDL 2.4 10/16/2017   VLDL 18 10/16/2017   LDLCALC 62 10/16/2017   LDLCALC 62 05/27/2017    Physical Findings: AIMS: Facial and Oral Movements Muscles of Facial Expression: Moderate Lips and Perioral Area: Moderate Jaw: Moderate Tongue: Moderate,Extremity Movements Upper (arms, wrists, hands, fingers): Moderate Lower (legs, knees, ankles, toes): Mild, Trunk Movements Neck, shoulders, hips: Mild, Overall Severity Severity of abnormal movements (highest score from questions above): Moderate Incapacitation due to abnormal movements: Mild Patient's awareness of abnormal movements (rate only patient's report): Aware, mild distress, Dental Status Current problems with  teeth and/or dentures?: Yes Does patient usually wear dentures?: No  CIWA:    COWS:     Musculoskeletal: Strength & Muscle Tone: within normal limits Gait & Station: normal Patient leans: N/A  Psychiatric Specialty Exam: Physical Exam  Nursing note and vitals reviewed.   Review of Systems  All other systems reviewed and are negative.   Blood pressure (!) 151/64, pulse (!) 106, temperature 97.8 F (36.6 C), temperature source Oral, resp. rate 18, height 5\' 2"  (1.575 m), weight 150 lb (68 kg), SpO2 100 %.Body mass index is 27.44 kg/m.  General Appearance: Casual, very poor dentition, mouth writhing  Eye Contact:  Fair  Speech:  Garbled  Volume:  Normal  Mood:  Irritable  Affect:  Labile  Thought Process:  Coherent  Orientation:  Full (Time, Place, and Person)  Thought Content:  Paranoid Ideation  Suicidal Thoughts:  No  Homicidal Thoughts:  No  Memory:  Immediate;   Poor  Judgement:  Impaired  Insight:  Lacking  Psychomotor Activity:  Normal  Concentration:  Concentration: Fair  Recall:  [employer] of Knowledge:  Fair  Language:  Fair  Akathisia:  No      Assets:  Resilience  ADL's:  Intact  Cognition:  Impaired,  Mild  Sleep:  Number of Hours: 5.3     Treatment Plan Summary: 67 yo female admitted due to paranoia and delusions. She is still paranoid about people going into her house and stealing things. She has been very needy and demanding  but no unsafe behaviors on the unit. She is paranoid and will not sign any consents to refer to ACT team until her brother can look at them. She did agree to starting [place] and was given initial injection on 10/17/17.  Urine analysis not suggestive of any infectious process.  Plan:  Schizophrenia -She was given initial dose of INvega Sustenna 234 mg on 10/17/17. Will give next dose next week. Continue oral Risperdal for now -Cogentin was stopped due to confusion and can also exacerbate TD  Confusion -Resolving.  UA  collected today 10/19/17.  Not suggestive of any infectious process. -Does probably have some degree of cognitive deficits at baseline, would need better assessment with MOCA versus SLUMS. TSH normal.  Consider doing B12, folate, RPR, HIV.  Insomnia -Elavil 25 mg qhs  DM -Continue Actos 15 mg daily -Continue Metformin 500 mg BID -SS insulin  Vaginal itching-possible yeast infection -Will treat with single dose of diflucan 150 mg  Dispo -Pt has not signed consents for ACTT referral yet due to paranoia. Will try to have nursing staff get these signed if her brother visist, which pt prefers.   Aundria Rud, MD 10/20/2017, 12:19 PM

## 2017-10-20 NOTE — Plan of Care (Signed)
Pt calm and cooperative. Pt medication compliant although she question the Risperdal prior to taking it. Then later after she took it, she stated she felt dizzy, helped patient to her room and she was fine. Pt also inquired about her metformin in which she refused earlier, did not receive being it was after 2000. Pt denies SI/HI. Pt did not report any hallucinations. Pt is receptive to treatment and safety maintained on unit. Will continue to monitor.

## 2017-10-20 NOTE — Progress Notes (Signed)
Patient refused CBG monitoring from this Clinical research associate.

## 2017-10-20 NOTE — Plan of Care (Signed)
Patient states that she slept "a lot" last night, however, she states to this writer that her goal for today is to "get my rest". Patient has been out in the milieu and has attended unit groups when appropriate for her. Patient verbalizes understanding of and has been in compliance with her medication regimen for majority of the day. Patient refused her CBG monitoring and prescribed medication at lunch time. Patient's coping ability has improved, and she has the ability to verbalize her feelings. Patient denies SI/HI/VH as well as any signs/symptoms of depression/anxiety at this time. Patient does endorse auditory hallucinations, stating to this writer that she is hearing " a little music, and my brothers arguing". Patient has achieved adequate nutrition by consuming most of her meals. Patient has been observed interacting well with other members on the unit, although she has been upset with staff throughout the day. Patient states that she wants to "call the police" because she feels like staff is "trying to keep somebody locked up in the hospital". Patient verbalizes understanding of the general information that has been provided to her and all questions/concerns have been addressed and answered at this time. Patient has demonstrated self-control on the unit and when needed she can be redirectable. Patient has been working towards maintaining her clinical measurements within normal limits. Patient uses fair eye contact when communicating with this Clinical research associate. Patient has the ability to identify the available resources that can assist her in meeting her health-care needs. Patient has not experienced any health-related complications and has remained free from injury on the unit thus far. Patient remains safe on the unit at this time.  Problem: Activity: Goal: Will verbalize the importance of balancing activity with adequate rest periods Outcome: Progressing   Problem: Education: Goal: Will be free of psychotic  symptoms Outcome: Progressing Goal: Knowledge of the prescribed therapeutic regimen will improve Outcome: Progressing   Problem: Coping: Goal: Coping ability will improve Outcome: Progressing Goal: Will verbalize feelings Outcome: Progressing   Problem: Health Behavior/Discharge Planning: Goal: Compliance with prescribed medication regimen will improve Outcome: Progressing   Problem: Nutritional: Goal: Ability to achieve adequate nutritional intake will improve Outcome: Progressing   Problem: Role Relationship: Goal: Ability to communicate needs accurately will improve Outcome: Progressing Goal: Ability to interact with others will improve Outcome: Progressing   Problem: Safety: Goal: Ability to redirect hostility and anger into socially appropriate behaviors will improve Outcome: Progressing Goal: Ability to remain free from injury will improve Outcome: Progressing   Problem: Self-Care: Goal: Ability to participate in self-care as condition permits will improve Outcome: Progressing   Problem: Self-Concept: Goal: Will verbalize positive feelings about self Outcome: Progressing   Problem: Education: Goal: Knowledge of [facility] Education information/materials will improve Outcome: Progressing Goal: Emotional status will improve Outcome: Progressing Goal: Mental status will improve Outcome: Progressing Goal: Verbalization of understanding the information provided will improve Outcome: Progressing   Problem: Activity: Goal: Interest or engagement in activities will improve Outcome: Progressing Goal: Sleeping patterns will improve Outcome: Progressing   Problem: Coping: Goal: Ability to verbalize frustrations and anger appropriately will improve Outcome: Progressing Goal: Ability to demonstrate self-control will improve Outcome: Progressing   Problem: Health Behavior/Discharge Planning: Goal: Identification of resources available to assist in  meeting health care needs will improve Outcome: Progressing Goal: Compliance with treatment plan for underlying cause of condition will improve Outcome: Progressing   Problem: Physical Regulation: Goal: Ability to maintain clinical measurements within normal limits will improve Outcome: Progressing   Problem:  Safety: Goal: Periods of time without injury will increase Outcome: Progressing   Problem: Activity: Goal: Will identify at least one activity in which they can participate Outcome: Progressing   Problem: Coping: Goal: Ability to identify and develop effective coping behavior will improve Outcome: Progressing Goal: Ability to interact with others will improve Outcome: Progressing Goal: Demonstration of participation in decision-making regarding own care will improve Outcome: Progressing Goal: Ability to use eye contact when communicating with others will improve Outcome: Progressing   Problem: Health Behavior/Discharge Planning: Goal: Identification of resources available to assist in meeting health care needs will improve Outcome: Progressing   Problem: Self-Concept: Goal: Will verbalize positive feelings about self Outcome: Progressing   Problem: Education: Goal: Ability to describe self-care measures that may prevent or decrease complications (Diabetes Survival Skills Education) will improve Outcome: Progressing   Problem: Coping: Goal: Ability to adjust to condition or change in health will improve Outcome: Progressing   Problem: Fluid Volume: Goal: Ability to maintain a balanced intake and output will improve Outcome: Progressing   Problem: Health Behavior/Discharge Planning: Goal: Ability to identify and utilize available resources and services will improve Outcome: Progressing Goal: Ability to manage health-related needs will improve Outcome: Progressing   Problem: Metabolic: Goal: Ability to maintain appropriate glucose levels will improve Outcome:  Progressing   Problem: Nutritional: Goal: Maintenance of adequate nutrition will improve Outcome: Progressing Goal: Progress toward achieving an optimal weight will improve Outcome: Progressing   Problem: Skin Integrity: Goal: Risk for impaired skin integrity will decrease Outcome: Progressing   Problem: Tissue Perfusion: Goal: Adequacy of tissue perfusion will improve Outcome: Progressing   Problem: Safety: Goal: Ability to remain free from injury will improve Outcome: Progressing

## 2017-10-20 NOTE — Plan of Care (Signed)
Pt. Verbalizes understanding of provided education. Pt. Compliant with medications this evening, coming up for medications appropriately. Pt. Denies SI/HI. Pt. Verbally is able to contract for safety.    Problem: Education: Goal: Knowledge of the prescribed therapeutic regimen will improve Outcome: Progressing   Problem: Health Behavior/Discharge Planning: Goal: Compliance with prescribed medication regimen will improve Outcome: Progressing   Problem: Safety: Goal: Ability to remain free from injury will improve Outcome: Progressing   Problem: Safety: Goal: Ability to remain free from injury will improve Outcome: Progressing

## 2017-10-20 NOTE — Progress Notes (Signed)
Patient refused CBG monitoring at this time.

## 2017-10-20 NOTE — Progress Notes (Signed)
D- Patient alert and oriented. Patient presents in a pleasant mood on assessment stating that she slept "a lot" last night. Patient endorses auditory hallucinations stating to this writer " I hear a little music and my brother's arguing". Patient denies SI, HI, VH, and pain at this time. Patient also denies any signs/symptoms of depression/anxiety at this time. Patient's goal for today is to "get my rest".   A- Scheduled medications administered to patient, per MD orders. Support and encouragement provided.  Routine safety checks conducted every 15 minutes.  Patient informed to notify staff with problems or concerns.  R- No adverse drug reactions noted. Patient contracts for safety at this time. Patient compliant with medications and treatment plan. Patient receptive, calm, and cooperative. Patient interacts well with others on the unit.  Patient remains safe at this time.

## 2017-10-20 NOTE — [facility] Group Notes (Signed)
LCSW Group Therapy Note 10/20/2017 1:15pm  Type of Therapy and Topic: Group Therapy: Feelings Around Returning Home & Establishing a Supportive Framework and Supporting Oneself When Supports Not Available  Participation Level: None  Description of Group:  Patients first processed thoughts and feelings about upcoming discharge. These included fears of upcoming changes, lack of change, new living environments, judgements and expectations from others and overall stigma of mental health issues. The group then discussed the definition of a supportive framework, what that looks and feels like, and how do to discern it from an unhealthy non-supportive network. The group identified different types of supports as well as what to do when your family/friends are less than helpful or unavailable  Therapeutic Goals  1. Patient will identify one healthy supportive network that they can use at discharge. 2. Patient will identify one factor of a supportive framework and how to tell it from an unhealthy network. 3. Patient able to identify one coping skill to use when they do not have positive supports from others. 4. Patient will demonstrate ability to communicate their needs through discussion and/or role plays.  Summary of Patient Progress:  Pt attended group introduced herself and left a few minutes after the group discussion started.   Therapeutic Modalities Cognitive Behavioral Therapy Motivational Interviewing   Rosemary Pentecost  CUEBAS-COLON, LCSW 10/20/2017 10:00 AM

## 2017-10-20 NOTE — [facility] Group Notes (Signed)
[facility] Group Notes:  (Nursing/MHT/Case Management/Adjunct)  Date:  10/20/2017  Time:  10:48 PM  Type of Therapy:  Group Therapy  Participation Level:  Did Not Attend  Summary of Progress/Problems:  Erica Mueller 10/20/2017, 10:48 PM

## 2017-10-21 LAB — GLUCOSE, CAPILLARY
GLUCOSE-CAPILLARY: 224 mg/dL — AB (ref 65–99)
Glucose-Capillary: 165 mg/dL — ABNORMAL HIGH (ref 65–99)

## 2017-10-21 MED ORDER — PALIPERIDONE PALMITATE ER 156 MG/ML IM SUSY
156.0000 mg | PREFILLED_SYRINGE | Freq: Once | INTRAMUSCULAR | Status: AC
  Administered 2017-10-23: 156 mg via INTRAMUSCULAR
  Filled 2017-10-21 (×2): qty 1

## 2017-10-21 MED ORDER — AMLODIPINE BESYLATE 5 MG PO TABS
5.0000 mg | ORAL_TABLET | Freq: Every day | ORAL | Status: DC
  Administered 2017-10-21 – 2017-10-24 (×4): 5 mg via ORAL
  Filled 2017-10-21 (×4): qty 1

## 2017-10-21 MED ORDER — CLONIDINE HCL 0.1 MG PO TABS: 0.1000 mg | ORAL_TABLET | Freq: Two times a day (BID) | ORAL | Status: DC | PRN

## 2017-10-21 NOTE — Plan of Care (Signed)
Pt. Verbalizes understanding of provided education, but continues to need reinforcement on the importance of checking blood sugar before snacks and meals. Pt. Compliant with medications this evening. Pt. Reports she can continue to remain safe while on the unit. Pt. Denies SI/HI. Pt. Verbally is able to contract for safety. Pt. Continues to present very labile and periodically will become agitated and irritable with staff. Pt. Frequently tangential. Pt. Does not attend groups.     Problem: Education: Goal: Will be free of psychotic symptoms Outcome: Not Progressing   Problem: Activity: Goal: Interest or engagement in activities will improve Outcome: Not Progressing   Problem: Education: Goal: Knowledge of the prescribed therapeutic regimen will improve Outcome: Progressing   Problem: Health Behavior/Discharge Planning: Goal: Compliance with prescribed medication regimen will improve Outcome: Progressing   Problem: Safety: Goal: Ability to remain free from injury will improve Outcome: Progressing   Problem: Health Behavior/Discharge Planning: Goal: Compliance with treatment plan for underlying cause of condition will improve Outcome: Progressing   Problem: Safety: Goal: Periods of time without injury will increase Outcome: Progressing   Problem: Safety: Goal: Ability to remain free from injury will improve Outcome: Progressing

## 2017-10-21 NOTE — Tx Team (Signed)
Interdisciplinary Treatment and Diagnostic Plan Update  10/21/2017 Time of Session: 11:00am Erica Mueller MRN: [id number]  Principal Diagnosis: Schizophrenia [facility])  Secondary Diagnoses: Principal Problem:   Schizophrenia (HCC)   Current Medications:  Current Facility-Administered Medications  Medication Dose Route Frequency Provider Last Rate Last Dose  . acetaminophen (TYLENOL) tablet 650 mg  650 mg Oral Q6H PRN Clapacs, John T, MD      . alum & mag hydroxide-simeth (MAALOX/MYLANTA) 200-200-20 MG/5ML suspension 30 mL  30 mL Oral Q4H PRN Clapacs, John T, MD      . amitriptyline (ELAVIL) tablet 25 mg  25 mg Oral QHS McNew, Ileene Hutchinson, MD   25 mg at 10/20/17 2115  . amLODipine (NORVASC) tablet 5 mg  5 mg Oral Daily McNew, Ileene Hutchinson, MD   5 mg at 10/21/17 [phone]  . cloNIDine (CATAPRES) tablet 0.1 mg  0.1 mg Oral BID PRN McNew, Holly R, MD      . insulin aspart (novoLOG) injection 0-9 Units  0-9 Units Subcutaneous TID WC Clapacs, Jackquline Denmark, MD   2 Units at 10/21/17 0801  . LORazepam (ATIVAN) tablet 0.5 mg  0.5 mg Oral Q6H PRN Haskell Riling, MD   0.5 mg at 10/19/17 0906  . magnesium hydroxide (MILK OF MAGNESIA) suspension 30 mL  30 mL Oral Daily PRN Clapacs, John T, MD      . metFORMIN (GLUCOPHAGE) tablet 500 mg  500 mg Oral BID WC Clapacs, Jackquline Denmark, MD   500 mg at 10/21/17 0803  . nicotine (NICODERM CQ - dosed in mg/24 hours) patch 21 mg  21 mg Transdermal Daily Clapacs, Jackquline Denmark, MD   21 mg at 10/21/17 0803  . OLANZapine (ZYPREXA) injection 5 mg  5 mg Intramuscular Q6H PRN McNew, Holly R, MD      . OLANZapine (ZYPREXA) tablet 5 mg  5 mg Oral Q6H PRN McNew, Ileene Hutchinson, MD   5 mg at 10/18/17 1357  . [START ON 10/23/2017] paliperidone (INVEGA SUSTENNA) injection 156 mg  156 mg Intramuscular Once McNew, Holly R, MD      . pioglitazone (ACTOS) tablet 15 mg  15 mg Oral Daily Clapacs, Jackquline Denmark, MD   15 mg at 10/21/17 0803  . pravastatin (PRAVACHOL) tablet 20 mg  20 mg Oral q1800 Clapacs, Jackquline Denmark, MD   20 mg at  10/19/17 1637  . risperiDONE (RISPERDAL) tablet 1 mg  1 mg Oral BH-q7a McNew, Ileene Hutchinson, MD   1 mg at 10/21/17 0657  . risperiDONE (RISPERDAL) tablet 2 mg  2 mg Oral QHS McNew, Ileene Hutchinson, MD   2 mg at 10/20/17 2115   PTA Medications: Medications Prior to Admission  Medication Sig Dispense Refill Last Dose  . benztropine (COGENTIN) 0.5 MG tablet Take 1 tablet (0.5 mg total) by mouth 2 (two) times daily. 60 tablet 2 unknown  . lovastatin (MEVACOR) 40 MG tablet Take 40 mg by mouth at bedtime.   unknown  . metFORMIN (GLUCOPHAGE) 500 MG tablet Take by mouth 2 (two) times daily with a meal.   unknown  . thiothixene (NAVANE) 5 MG capsule Take 1 capsule (5 mg total) by mouth 2 (two) times daily. 60 capsule 2 unknown    Patient Stressors: Health problems Medication change or noncompliance  Patient Strengths: Capable of independent living Supportive family/friends  Treatment Modalities: Medication Management, Group therapy, Case management,  1 to 1 session with clinician, Psychoeducation, Recreational therapy.   Physician Treatment Plan for Primary Diagnosis: Schizophrenia [facility]) Long Term  Goal(s): Improvement in symptoms so as ready for discharge   Short Term Goals: Ability to demonstrate self-control will improve  Medication Management: Evaluate patient's response, side effects, and tolerance of medication regimen.  Therapeutic Interventions: 1 to 1 sessions, Unit Group sessions and Medication administration.  Evaluation of Outcomes: Progressing  Physician Treatment Plan for Secondary Diagnosis: Principal Problem:   Schizophrenia (HCC)  Long Term Goal(s): Improvement in symptoms so as ready for discharge   Short Term Goals: Ability to demonstrate self-control will improve     Medication Management: Evaluate patient's response, side effects, and tolerance of medication regimen.  Therapeutic Interventions: 1 to 1 sessions, Unit Group sessions and Medication administration.  Evaluation  of Outcomes: Progressing   RN Treatment Plan for Primary Diagnosis: Schizophrenia (HCC) Long Term Goal(s): Knowledge of disease and therapeutic regimen to maintain health will improve  Short Term Goals: Ability to identify and develop effective coping behaviors will improve and Compliance with prescribed medications will improve  Medication Management: RN will administer medications as ordered by provider, will assess and evaluate patient's response and provide education to patient for prescribed medication. RN will report any adverse and/or side effects to prescribing provider.  Therapeutic Interventions: 1 on 1 counseling sessions, Psychoeducation, Medication administration, Evaluate responses to treatment, Monitor vital signs and CBGs as ordered, Perform/monitor CIWA, COWS, AIMS and Fall Risk screenings as ordered, Perform wound care treatments as ordered.  Evaluation of Outcomes: Progressing   LCSW Treatment Plan for Primary Diagnosis: Schizophrenia (HCC) Long Term Goal(s): Safe transition to appropriate next level of care at discharge, Engage patient in therapeutic group addressing interpersonal concerns.  Short Term Goals: Engage patient in aftercare planning with referrals and resources, Identify triggers associated with mental health/substance abuse issues and Increase skills for wellness and recovery  Therapeutic Interventions: Assess for all discharge needs, 1 to 1 time with Social worker, Explore available resources and support systems, Assess for adequacy in community support network, Educate family and significant other(s) on suicide prevention, Complete Psychosocial Assessment, Interpersonal group therapy.  Evaluation of Outcomes: Progressing   Progress in Treatment: Attending groups: Yes. Participating in groups: Yes. Taking medication as prescribed: Yes. Toleration medication: Yes. Family/Significant other contact made: No, will contact:    Patient understands  diagnosis: Yes. Discussing patient identified problems/goals with staff: Yes. Medical problems stabilized or resolved: Yes. Denies suicidal/homicidal ideation: Yes. Issues/concerns per patient self-inventory: Yes. Other:    New problem(s) identified: No, Describe:     New Short Term/Long Term Goal(s):  Discharge Plan or Barriers: Discharge home with ACTT team.  Referral to [facility] made today  Reason for Continuation of Hospitalization: Anxiety Hallucinations Medication stabilization  Coordination of After care  Estimated Length of Stay: 5-7days  Recreational Therapy: Patient Stressors: Family   Patient Goal: Patient will engage in groups without prompting or encouragement from LRT x3 group sessions within 5 recreation therapy group sessions  Attendees: Patient: 10/21/2017 4:53 PM  Physician: Corinna Gab, MD 10/21/2017 4:53 PM  Nursing: Hulan Amato, RN 10/21/2017 4:53 PM  RN Care Manager: 10/21/2017 4:53 PM  Social Worker: Jake Shark, LCSW 10/21/2017 4:53 PM  Recreational Therapist: Garret Reddish, LRT 10/21/2017 4:53 PM  Other: Heidi Dach, LCSW 10/21/2017 4:53 PM  Other: Johny Shears, LCSWA 10/21/2017 4:53 PM  Other: 10/21/2017 4:53 PM    Scribe for Treatment Team: Glennon Mac, LCSW 10/21/2017 4:53 PM

## 2017-10-21 NOTE — Progress Notes (Addendum)
[facility] MD Progress Note  10/21/2017 12:27 PM Erica Mueller  MRN:  [id number] Subjective:  Pt continues to be irritable and demanding and constantly at nursing station. Pt states taht she wants to go home because she is very upset about how we are treating her here. She states that she will refuses to take the [place] injection on Wednesday and states, "You can get the [place] out of my room!" Pt refuses to answer further questions. Pt called 911 twice over the weekend.  Principal Problem: Schizophrenia (HCC) Diagnosis:   Patient Active Problem List   Diagnosis Date Noted  . Schizophrenia (HCC) [F20.9] 05/27/2017    Priority: High  . Schizoaffective disorder, depressive type (HCC) [F25.1] 04/02/2017  . Diabetes mellitus without complication (HCC) [E11.9] 04/02/2017  . Hypertension [I10] 04/02/2017  . Noncompliance [Z91.19] 04/02/2017   Total Time spent with patient: 15 minutes  Past Psychiatric History: See H&P  Past Medical History:  Past Medical History:  Diagnosis Date  . Coronary artery disease   . Depression   . Diabetes mellitus without complication (HCC)   . Elevated lipids   . GERD (gastroesophageal reflux disease)   . Hypertension   . Syphilis (acquired)     Past Surgical History:  Procedure Laterality Date  . COLONOSCOPY    . COLONOSCOPY WITH PROPOFOL N/A 09/01/2015   Procedure: COLONOSCOPY WITH PROPOFOL;  Surgeon: Wallace Cullens, MD;  Location: [facility] ENDOSCOPY;  Service: Gastroenterology;  Laterality: N/A;  . TUBAL LIGATION    . wisdom teeth removal     Family History: History reviewed. No pertinent family history. Family Psychiatric  History: See H&P Social History:  Social History   Substance and Sexual Activity  Alcohol Use Yes   Comment: occassionally     Social History   Substance and Sexual Activity  Drug Use No    Social History   Socioeconomic History  . Marital status: Divorced    Spouse name: Not on file  . Number of children: Not on file  . Years of  education: Not on file  . Highest education level: Not on file  Occupational History  . Not on file  Social Needs  . Financial resource strain: Not on file  . Food insecurity:    Worry: Not on file    Inability: Not on file  . Transportation needs:    Medical: Not on file    Non-medical: Not on file  Tobacco Use  . Smoking status: Current Every Day Smoker    Packs/day: 1.00    Types: Cigarettes  . Smokeless tobacco: Never Used  Substance and Sexual Activity  . Alcohol use: Yes    Comment: occassionally  . Drug use: No  . Sexual activity: Not on file  Lifestyle  . Physical activity:    Days per week: Not on file    Minutes per session: Not on file  . Stress: Not on file  Relationships  . Social connections:    Talks on phone: Not on file    Gets together: Not on file    Attends religious service: Not on file    Active member of club or organization: Not on file    Attends meetings of clubs or organizations: Not on file    Relationship status: Not on file  Other Topics Concern  . Not on file  Social History Narrative  . Not on file   Additional Social History:  Sleep: Fair  Appetite:  Fair  Current Medications: Current Facility-Administered Medications  Medication Dose Route Frequency Provider Last Rate Last Dose  . acetaminophen (TYLENOL) tablet 650 mg  650 mg Oral Q6H PRN Clapacs, John T, MD      . alum & mag hydroxide-simeth (MAALOX/MYLANTA) 200-200-20 MG/5ML suspension 30 mL  30 mL Oral Q4H PRN Clapacs, John T, MD      . amitriptyline (ELAVIL) tablet 25 mg  25 mg Oral QHS Kemper Hochman, Ileene Hutchinson, MD   25 mg at 10/20/17 2115  . amLODipine (NORVASC) tablet 5 mg  5 mg Oral Daily Ghazi Rumpf, Ileene Hutchinson, MD   5 mg at 10/21/17 [phone]  . insulin aspart (novoLOG) injection 0-9 Units  0-9 Units Subcutaneous TID WC Clapacs, Jackquline Denmark, MD   2 Units at 10/21/17 0801  . LORazepam (ATIVAN) tablet 0.5 mg  0.5 mg Oral Q6H PRN Haskell Riling, MD   0.5 mg at 10/19/17  0906  . magnesium hydroxide (MILK OF MAGNESIA) suspension 30 mL  30 mL Oral Daily PRN Clapacs, John T, MD      . metFORMIN (GLUCOPHAGE) tablet 500 mg  500 mg Oral BID WC Clapacs, Jackquline Denmark, MD   500 mg at 10/21/17 0803  . nicotine (NICODERM CQ - dosed in mg/24 hours) patch 21 mg  21 mg Transdermal Daily Clapacs, Jackquline Denmark, MD   21 mg at 10/21/17 0803  . OLANZapine (ZYPREXA) injection 5 mg  5 mg Intramuscular Q6H PRN Loghan Subia R, MD      . OLANZapine (ZYPREXA) tablet 5 mg  5 mg Oral Q6H PRN Embrie Mikkelsen, Ileene Hutchinson, MD   5 mg at 10/18/17 1357  . [START ON 10/23/2017] paliperidone (INVEGA SUSTENNA) injection 156 mg  156 mg Intramuscular Once Shivan Hodes R, MD      . pioglitazone (ACTOS) tablet 15 mg  15 mg Oral Daily Clapacs, Jackquline Denmark, MD   15 mg at 10/21/17 0803  . pravastatin (PRAVACHOL) tablet 20 mg  20 mg Oral q1800 Clapacs, Jackquline Denmark, MD   20 mg at 10/19/17 1637  . risperiDONE (RISPERDAL) tablet 1 mg  1 mg Oral BH-q7a Jisselle Poth, Ileene Hutchinson, MD   1 mg at 10/21/17 0657  . risperiDONE (RISPERDAL) tablet 2 mg  2 mg Oral QHS Saphyre Cillo, Ileene Hutchinson, MD   2 mg at 10/20/17 2115    Lab Results:  Results for orders placed or performed during the hospital encounter of 10/16/17 (from the past 48 hour(s))  Urinalysis, Complete w Microscopic     Status: Abnormal   Collection Time: 10/19/17 12:48 PM  Result Value Ref Range   Color, Urine STRAW (A) YELLOW   APPearance CLEAR (A) CLEAR   Specific Gravity, Urine 1.003 (L) 1.005 - 1.030   pH 7.0 5.0 - 8.0   Glucose, UA 50 (A) NEGATIVE mg/dL   Hgb urine dipstick NEGATIVE NEGATIVE   Bilirubin Urine NEGATIVE NEGATIVE   Ketones, ur NEGATIVE NEGATIVE mg/dL   Protein, ur NEGATIVE NEGATIVE mg/dL   Nitrite NEGATIVE NEGATIVE   Leukocytes, UA SMALL (A) NEGATIVE   RBC / HPF 0-5 0 - 5 RBC/hpf   WBC, UA 0-5 0 - 5 WBC/hpf   Bacteria, UA RARE (A) NONE SEEN   Squamous Epithelial / LPF 0-5 0 - 5    Comment: Performed at [facility], [address] [place], [place] [address]  Glucose,  capillary     Status: None   Collection Time: 10/19/17  4:37 PM  Result Value  Ref Range   Glucose-Capillary 97 65 - 99 mg/dL  Glucose, capillary     Status: Abnormal   Collection Time: 10/19/17  9:18 PM  Result Value Ref Range   Glucose-Capillary 190 (H) 65 - 99 mg/dL  Glucose, capillary     Status: Abnormal   Collection Time: 10/20/17  7:05 AM  Result Value Ref Range   Glucose-Capillary 191 (H) 65 - 99 mg/dL   Comment 1 Notify RN   Glucose, capillary     Status: Abnormal   Collection Time: 10/20/17  8:13 PM  Result Value Ref Range   Glucose-Capillary 203 (H) 65 - 99 mg/dL  Glucose, capillary     Status: Abnormal   Collection Time: 10/21/17  7:05 AM  Result Value Ref Range   Glucose-Capillary 165 (H) 65 - 99 mg/dL    Blood Alcohol level:  Lab Results  Component Value Date   ETH <10 10/15/2017   ETH <10 10/01/2017    Metabolic Disorder Labs: Lab Results  Component Value Date   HGBA1C 8.9 (H) 10/16/2017   MPG 208.73 10/16/2017   MPG 143 05/27/2017   No results found for: PROLACTIN Lab Results  Component Value Date   CHOL 136 10/16/2017   TRIG 90 10/16/2017   HDL 56 10/16/2017   CHOLHDL 2.4 10/16/2017   VLDL 18 10/16/2017   LDLCALC 62 10/16/2017   LDLCALC 62 05/27/2017    Physical Findings: AIMS: Facial and Oral Movements Muscles of Facial Expression: Moderate Lips and Perioral Area: Moderate Jaw: Moderate Tongue: Moderate,Extremity Movements Upper (arms, wrists, hands, fingers): Moderate Lower (legs, knees, ankles, toes): Mild, Trunk Movements Neck, shoulders, hips: Mild, Overall Severity Severity of abnormal movements (highest score from questions above): Moderate Incapacitation due to abnormal movements: Mild Patient's awareness of abnormal movements (rate only patient's report): Aware, mild distress, Dental Status Current problems with teeth and/or dentures?: Yes Does patient usually wear dentures?: No  CIWA:    COWS:     Musculoskeletal: Strength &  Muscle Tone: within normal limits Gait & Station: normal Patient leans: Backward  Psychiatric Specialty Exam: Physical Exam  Nursing note and vitals reviewed.   Review of Systems  All other systems reviewed and are negative.   Blood pressure (!) 137/108, pulse (!) 110, temperature 97.8 F (36.6 C), temperature source Oral, resp. rate 18, height 5\' 2"  (1.575 m), weight 68 kg (150 lb), SpO2 100 %.Body mass index is 27.44 kg/m.  General Appearance: Guarded  Eye Contact:  Minimal  Speech:  Slow  Volume:  Normal  Mood:  Irritable  Affect:  Congruent  Thought Process:  Coherent  Orientation:  Full (Time, Place, and Person)  Thought Content:  Illogical  Suicidal Thoughts:  No  Homicidal Thoughts:  No  Memory:  Immediate;   Fair  Judgement:  Impaired  Insight:  Lacking  Psychomotor Activity:  Normal  Concentration:  Concentration: Poor  Recall:  Poor  Fund of Knowledge:  Fair  Language:  Fair  Akathisia:  No      Assets:  Resilience  ADL's:  Intact  Cognition:  Impaired,  Mild  Sleep:  Number of Hours: 5.3     Treatment Plan Summary: 67 yo female admitted due to paranoia. Pt has been irritable and demanding on the unit which is her baseline. She called 911 twice over the weekend. She will get second [place] Injection on Wednesday. She was very irritable with this provider today. She refused lab work this morning and is refusing blood glucose checks.  Plan:  Schizophrenia -Will get second [place]ve[place] injection on Wednesday -Continue oral Risperdal   Confusion -less confused today. UA negative for UTI -likely cognitive impairment-B12, Folate, RPR, HIV ordered but patient refused  Insomnia -Elavil 25 mg daily  DM -Continue Actos 15 mg daily -Continue Metformin 500 mg BID  HTN -Start Norvasc 5 mg daily -prn clonidine  Dispo -Pt will not sign consents for [place] to refer to ACTT team or to contact her brothers.    Haskell Riling, MD 10/21/2017, 12:27 PM

## 2017-10-21 NOTE — Plan of Care (Signed)
  Problem: Education: Goal: Will be free of psychotic symptoms Outcome: Progressing   Problem: Health Behavior/Discharge Planning: Goal: Compliance with prescribed medication regimen will improve Outcome: Progressing   Problem: Role Relationship: Goal: Ability to communicate needs accurately will improve Outcome: Progressing   Problem: Safety: Goal: Ability to remain free from injury will improve Outcome: Progressing   Problem: Self-Care: Goal: Ability to participate in self-care as condition permits will improve Outcome: Progressing   Problem: Education: Goal: Mental status will improve Outcome: Progressing   Problem: Coping: Goal: Ability to demonstrate self-control will improve Outcome: Progressing

## 2017-10-21 NOTE — Progress Notes (Signed)
Patient moved to room #10 due to back hall used for disruptive patients.  Patient continues to be irritable and threatening to "call the police."  Patient has requested papers out of her pocketbook numerous times today.  She states, "I need to pay my bills!"  Patient has been informed that we cannot go back into her locker for any items.  Patient refused to have her CBG done at 1200.

## 2017-10-21 NOTE — Progress Notes (Signed)
D:Pt denies SI/HI/AVH. Pt. Reports she can continue to remain safe while on the unit. Pt. Verbally is able to contract for safety. Pt is pleasant and cooperative with this Clinical research associate during assessment, but frequently with staff can become agitated and irritable at times. Overall pt. Very labile. Pt. has no complaints this evening. Pt. Frequently tangential and intrusive up at the nursing station needing to be redirected.     A: Q x 15 minute observation checks were completed for safety. Patient was provided with education, but continues to need reinforcement on the importance of checking blood sugar before snacks and meals. Patient was given scheduled medications. Patient  was encourage to attend groups, participate in unit activities and continue with plan of care. Pt. Given support and encouragement. Pt. Blood sugars monitored per MD orders. Pt. Chart and care plans reviewed.  R:Patient is complaint with medication and unit procedures. Pt. Does not attend groups, but does attend snacks.             Precautionary checks every 15 minutes for safety maintained, room free of safety hazards, patient sustains no injury or falls during this shift.

## 2017-10-21 NOTE — Progress Notes (Signed)
D: Patient is visible in the milieu.  She is observed eating breakfast this morning.  She is compliant with her medications.  Patient's BP elevated and norvasc 5 mg was ordered for same.  Will recheck at 1200.  Patient denies any thoughts of self harm.  She was encouraged to go to group this morning and she states, "I would just like to get some rest.  I don't know what group is about anyway."  She stays mostly to her room.  She does not report any hallucinations and does not appear to be responding to internal stimuli.  A: Continue to monitor medication management and MD orders.  Safety checks completed every 15 minutes per protocol.  Offer support and encouragement as needed.  R: Patient is receptive to staff; her behavior is appropriate.

## 2017-10-21 NOTE — Progress Notes (Signed)
Patient ID: Erica Mueller, female   DOB: 06/06/1951, 67 y.o.   MRN: [id number] CSW met with Ms. Terra to discuss her concerns about bills.  CSW wrote down a few numbers for her.  She expressed concern about not being able to go pay bills in person and has no online accounts set up. CSW encouraged her to pay them by phone if possible and if not to ask for a postponed due date as she is in the hospital. Pt asked about a social worker and if she knew her APS worker Randi. CSW let her know that we did and that if she would sign releases we could get the other ACTT social worker to come in and talk with her about the program.  She signed releases and CSW sent referral to [facility]. CSW notified Janice with [facility] that referral had been made.   , LCSW 

## 2017-10-21 NOTE — BHH Group Notes (Signed)
LCSW Group Therapy Note   10/21/2017 1:00pm   Type of Therapy and Topic:  Group Therapy:  Overcoming Obstacles   Participation Level:  Did Not Attend   Description of Group:    In this group patients will be encouraged to explore what they see as obstacles to their own wellness and recovery. They will be guided to discuss their thoughts, feelings, and behaviors related to these obstacles. The group will process together ways to cope with barriers, with attention given to specific choices patients can make. Each patient will be challenged to identify changes they are motivated to make in order to overcome their obstacles. This group will be process-oriented, with patients participating in exploration of their own experiences as well as giving and receiving support and challenge from other group members.   Therapeutic Goals: 1. Patient will identify personal and current obstacles as they relate to admission. 2. Patient will identify barriers that currently interfere with their wellness or overcoming obstacles.  3. Patient will identify feelings, thought process and behaviors related to these barriers. 4. Patient will identify two changes they are willing to make to overcome these obstacles:      Summary of Patient Progress      Therapeutic Modalities:   Cognitive Behavioral Therapy Solution Focused Therapy Motivational Interviewing Relapse Prevention Therapy  Glennon Mac, LCSW 10/21/2017 3:38 PM

## 2017-10-21 NOTE — Progress Notes (Signed)
Recreation Therapy Notes  Date: 10/21/2017  Time: 9:30 am   Location: Craft Room   Behavioral response: N/A   Intervention Topic:  Problem Solving  Discussion/Intervention: Patient did not attend group.   Clinical Observations/Feedback:  Patient did not attend group.   Beyonce Sawatzky LRT/CTRS        Romain Erion 10/21/2017 10:09 AM 

## 2017-10-22 LAB — GLUCOSE, CAPILLARY
GLUCOSE-CAPILLARY: 291 mg/dL — AB (ref 65–99)
Glucose-Capillary: 211 mg/dL — ABNORMAL HIGH (ref 65–99)

## 2017-10-22 MED ORDER — METFORMIN HCL ER 500 MG PO TB24
500.0000 mg | ORAL_TABLET | Freq: Every day | ORAL | Status: DC
  Administered 2017-10-23 – 2017-10-24 (×2): 500 mg via ORAL
  Filled 2017-10-22 (×2): qty 1

## 2017-10-22 MED ORDER — HALOPERIDOL 5 MG PO TABS: 5.0000 mg | ORAL_TABLET | Freq: Four times a day (QID) | ORAL | Status: DC | PRN

## 2017-10-22 MED ORDER — DIPHENHYDRAMINE HCL 50 MG/ML IJ SOLN
50.0000 mg | Freq: Four times a day (QID) | INTRAMUSCULAR | Status: DC | PRN
Start: 2017-10-22 — End: 2017-10-24

## 2017-10-22 MED ORDER — THIOTHIXENE 2 MG PO CAPS
2.0000 mg | ORAL_CAPSULE | Freq: Two times a day (BID) | ORAL | Status: DC
  Administered 2017-10-22 – 2017-10-24 (×4): 2 mg via ORAL
  Filled 2017-10-22 (×5): qty 1

## 2017-10-22 MED ORDER — LORAZEPAM 2 MG/ML IJ SOLN: 1.0000 mg | Freq: Four times a day (QID) | INTRAMUSCULAR | Status: DC | PRN

## 2017-10-22 MED ORDER — DIPHENHYDRAMINE HCL 25 MG PO CAPS: 50.0000 mg | ORAL_CAPSULE | Freq: Four times a day (QID) | ORAL | Status: DC | PRN

## 2017-10-22 MED ORDER — HALOPERIDOL LACTATE 5 MG/ML IJ SOLN: 5.0000 mg | Freq: Four times a day (QID) | INTRAMUSCULAR | Status: DC | PRN

## 2017-10-22 MED ORDER — LORAZEPAM 1 MG PO TABS: 1.0000 mg | ORAL_TABLET | Freq: Four times a day (QID) | ORAL | Status: DC | PRN

## 2017-10-22 NOTE — Plan of Care (Signed)
Patient is resistant to care and refusing medications and CBGs. Making no progress.  Problem: Activity: Goal: Will verbalize the importance of balancing activity with adequate rest periods Outcome: Not Progressing   Problem: Education: Goal: Will be free of psychotic symptoms Outcome: Not Progressing Goal: Knowledge of the prescribed therapeutic regimen will improve Outcome: Not Progressing   Problem: Coping: Goal: Coping ability will improve Outcome: Not Progressing Goal: Will verbalize feelings Outcome: Not Progressing   Problem: Health Behavior/Discharge Planning: Goal: Compliance with prescribed medication regimen will improve Outcome: Not Progressing   Problem: Nutritional: Goal: Ability to achieve adequate nutritional intake will improve Outcome: Not Progressing   Problem: Role Relationship: Goal: Ability to communicate needs accurately will improve Outcome: Not Progressing Goal: Ability to interact with others will improve Outcome: Not Progressing   Problem: Safety: Goal: Ability to redirect hostility and anger into socially appropriate behaviors will improve Outcome: Not Progressing Goal: Ability to remain free from injury will improve Outcome: Not Progressing   Problem: Self-Care: Goal: Ability to participate in self-care as condition permits will improve Outcome: Not Progressing   Problem: Self-Concept: Goal: Will verbalize positive feelings about self Outcome: Not Progressing   Problem: Education: Goal: Knowledge of [place] [facility] Education information/materials will improve Outcome: Not Progressing Goal: Emotional status will improve Outcome: Not Progressing Goal: Mental status will improve Outcome: Not Progressing Goal: Verbalization of understanding the information provided will improve Outcome: Not Progressing   Problem: Activity: Goal: Interest or engagement in activities will improve Outcome: Not Progressing Goal: Sleeping patterns will  improve Outcome: Not Progressing   Problem: Coping: Goal: Ability to verbalize frustrations and anger appropriately will improve Outcome: Not Progressing Goal: Ability to demonstrate self-control will improve Outcome: Not Progressing   Problem: Health Behavior/Discharge Planning: Goal: Identification of resources available to assist in meeting health care needs will improve Outcome: Not Progressing Goal: Compliance with treatment plan for underlying cause of condition will improve Outcome: Not Progressing   Problem: Physical Regulation: Goal: Ability to maintain clinical measurements within normal limits will improve Outcome: Not Progressing   Problem: Safety: Goal: Periods of time without injury will increase Outcome: Not Progressing   Problem: Activity: Goal: Will identify at least one activity in which they can participate Outcome: Not Progressing   Problem: Coping: Goal: Ability to identify and develop effective coping behavior will improve Outcome: Not Progressing Goal: Ability to interact with others will improve Outcome: Not Progressing Goal: Demonstration of participation in decision-making regarding own care will improve Outcome: Not Progressing Goal: Ability to use eye contact when communicating with others will improve Outcome: Not Progressing   Problem: Health Behavior/Discharge Planning: Goal: Identification of resources available to assist in meeting health care needs will improve Outcome: Not Progressing   Problem: Self-Concept: Goal: Will verbalize positive feelings about self Outcome: Not Progressing   Problem: Education: Goal: Ability to describe self-care measures that may prevent or decrease complications (Diabetes Survival Skills Education) will improve Outcome: Not Progressing   Problem: Coping: Goal: Ability to adjust to condition or change in health will improve Outcome: Not Progressing   Problem: Fluid Volume: Goal: Ability to maintain a  balanced intake and output will improve Outcome: Not Progressing   Problem: Health Behavior/Discharge Planning: Goal: Ability to identify and utilize available resources and services will improve Outcome: Not Progressing Goal: Ability to manage health-related needs will improve Outcome: Not Progressing   Problem: Safety: Goal: Ability to remain free from injury will improve Outcome: Not Progressing

## 2017-10-22 NOTE — Progress Notes (Signed)
It was brought to this writer's attention that patient has destroyed her room by throwing her clothes and graham crackers everywhere and poured juice all over her room. Patient has also thrown items from the day room all over her room.

## 2017-10-22 NOTE — B[facility] Group Notes (Signed)
LCSW Group Therapy Note  10/22/2017 1:00pm  Type of Therapy/Topic:  Group Therapy:  Feelings about Diagnosis  Participation Level:  Did Not Attend   Description of Group:   This group will allow patients to explore their thoughts and feelings about diagnoses they have received. Patients will be guided to explore their level of understanding and acceptance of these diagnoses. Facilitator will encourage patients to process their thoughts and feelings about the reactions of others to their diagnosis and will guide patients in identifying ways to discuss their diagnosis with significant others in their lives. This group will be process-oriented, with patients participating in exploration of their own experiences, giving and receiving support, and processing challenge from other group members.   Therapeutic Goals: 1. Patient will demonstrate understanding of diagnosis as evidenced by identifying two or more symptoms of the disorder 2. Patient will be able to express two feelings regarding the diagnosis 3. Patient will demonstrate their ability to communicate their needs through discussion and/or role play  Summary of Patient Progress: Pt unable to attend today. Behaviors escalating as she is demanding to be discharged and banging on windows and doors.   Therapeutic Modalities:   Cognitive Behavioral Therapy Brief Therapy Feelings Identification    Glennon Mac, LCSW 10/22/2017 4:03 PM

## 2017-10-22 NOTE — Progress Notes (Signed)
Patient refused CBG monitoring. 

## 2017-10-22 NOTE — BHH Group Notes (Signed)
CSW Group Therapy Note  10/22/2017  Time:  0900  Type of Therapy and Topic: Group Therapy: Goals Group: SMART Goals    Participation Level:  Did Not Attend    Description of Group:   The purpose of a daily goals group is to assist and guide patients in setting recovery/wellness-related goals. The objective is to set goals as they relate to the crisis in which they were admitted. Patients will be using SMART goal modalities to set measurable goals. Characteristics of realistic goals will be discussed and patients will be assisted in setting and processing how one will reach their goal. Facilitator will also assist patients in applying interventions and coping skills learned in psycho-education groups to the SMART goal and process how one will achieve defined goal.    Therapeutic Goals:  -Patients will develop and document one goal related to or their crisis in which brought them into treatment.  -Patients will be guided by LCSW using SMART goal setting modality in how to set a measurable, attainable, realistic and time sensitive goal.  -Patients will process barriers in reaching goal.  -Patients will process interventions in how to overcome and successful in reaching goal.    Patient's Goal:  Pt was invited to attend group but chose not to attend. CSW will continue to encourage pt to attend group throughout their admission.   Therapeutic Modalities:  Motivational Interviewing  Cognitive Behavioral Therapy  Crisis Intervention Model  SMART goals setting  Heidi Dach, MSW, LCSW Clinical Social Worker 10/22/2017 9:39 AM

## 2017-10-22 NOTE — Plan of Care (Signed)
Patient states that she slept "like a rock" last night, however, she states to this Clinical research associate that her goal for today is to "just relax". Patient has been out in the milieu and has attended unit groups when appropriate for her. Patient verbalizes understanding of her medication regimen, but has been noncompliant for majority of the day. Patient refused her CBG monitoring and prescribed medication at lunch time. Patient has the ability to verbalize her feelings, but has not been using her coping skills today. Patient has not demonstrated self-control on the unit because she destroyed her room and urinated on the medication room floor. Patient has been easily redirectable towards the end of the shift being that she has taken her evening medications as prescribed. Patient denies SI/HI/VH as well as any signs/symptoms of depression/anxiety at this time. Patient does endorse having thoughts about "different things and that young man in the dayroom and having sex". Patient has achieved adequate nutrition by consuming most of her meals while she is awake. Patient has been observed interacting well with other members on the unit, although she has been upset with staff throughout the day. Patient verbalizes understanding of the general information that has been provided to her and all questions/concerns have been addressed and answered at this time. Patient has been working towards maintaining her clinical measurements within normal limits. Patient uses fair eye contact when communicating with this Clinical research associate. Patient has the ability to identify the available resources that can assist her in meeting her health-care needs, however, she has not voiced any to this Clinical research associate today. Patient has not experienced any health-related complications and has remained free from injury on the unit thus far. Patient remains safe on the unit at this time.  Problem: Activity: Goal: Will verbalize the importance of balancing activity with adequate  rest periods Outcome: Progressing   Problem: Education: Goal: Will be free of psychotic symptoms Outcome: Progressing Goal: Knowledge of the prescribed therapeutic regimen will improve Outcome: Progressing   Problem: Coping: Goal: Coping ability will improve Outcome: Progressing Goal: Will verbalize feelings Outcome: Progressing   Problem: Health Behavior/Discharge Planning: Goal: Compliance with prescribed medication regimen will improve Outcome: Progressing   Problem: Nutritional: Goal: Ability to achieve adequate nutritional intake will improve Outcome: Progressing   Problem: Role Relationship: Goal: Ability to communicate needs accurately will improve Outcome: Progressing Goal: Ability to interact with others will improve Outcome: Progressing   Problem: Safety: Goal: Ability to redirect hostility and anger into socially appropriate behaviors will improve Outcome: Progressing Goal: Ability to remain free from injury will improve Outcome: Progressing   Problem: Self-Care: Goal: Ability to participate in self-care as condition permits will improve Outcome: Progressing   Problem: Self-Concept: Goal: Will verbalize positive feelings about self Outcome: Progressing   Problem: Education: Goal: Knowledge of [place] [facility] Education information/materials will improve Outcome: Progressing Goal: Emotional status will improve Outcome: Progressing Goal: Mental status will improve Outcome: Progressing Goal: Verbalization of understanding the information provided will improve Outcome: Progressing   Problem: Activity: Goal: Interest or engagement in activities will improve Outcome: Progressing Goal: Sleeping patterns will improve Outcome: Progressing   Problem: Coping: Goal: Ability to verbalize frustrations and anger appropriately will improve Outcome: Progressing Goal: Ability to demonstrate self-control will improve Outcome: Progressing   Problem: Health  Behavior/Discharge Planning: Goal: Identification of resources available to assist in meeting health care needs will improve Outcome: Progressing Goal: Compliance with treatment plan for underlying cause of condition will improve Outcome: Progressing   Problem: Physical Regulation: Goal:  Ability to maintain clinical measurements within normal limits will improve Outcome: Progressing   Problem: Safety: Goal: Periods of time without injury will increase Outcome: Progressing   Problem: Activity: Goal: Will identify at least one activity in which they can participate Outcome: Progressing   Problem: Coping: Goal: Ability to identify and develop effective coping behavior will improve Outcome: Progressing Goal: Ability to interact with others will improve Outcome: Progressing Goal: Demonstration of participation in decision-making regarding own care will improve Outcome: Progressing Goal: Ability to use eye contact when communicating with others will improve Outcome: Progressing   Problem: Health Behavior/Discharge Planning: Goal: Identification of resources available to assist in meeting health care needs will improve Outcome: Progressing   Problem: Self-Concept: Goal: Will verbalize positive feelings about self Outcome: Progressing   Problem: Education: Goal: Ability to describe self-care measures that may prevent or decrease complications (Diabetes Survival Skills Education) will improve Outcome: Progressing   Problem: Coping: Goal: Ability to adjust to condition or change in health will improve Outcome: Progressing   Problem: Fluid Volume: Goal: Ability to maintain a balanced intake and output will improve Outcome: Progressing   Problem: Health Behavior/Discharge Planning: Goal: Ability to identify and utilize available resources and services will improve Outcome: Progressing Goal: Ability to manage health-related needs will improve Outcome: Progressing   Problem:  Metabolic: Goal: Ability to maintain appropriate glucose levels will improve Outcome: Progressing   Problem: Nutritional: Goal: Maintenance of adequate nutrition will improve Outcome: Progressing Goal: Progress toward achieving an optimal weight will improve Outcome: Progressing   Problem: Skin Integrity: Goal: Risk for impaired skin integrity will decrease Outcome: Progressing   Problem: Tissue Perfusion: Goal: Adequacy of tissue perfusion will improve Outcome: Progressing   Problem: Safety: Goal: Ability to remain free from injury will improve Outcome: Progressing

## 2017-10-22 NOTE — [facility] Suicide Risk Assessment (Signed)
[facility] INPATIENT:  Family/Significant Other Suicide Prevention Education  Suicide Prevention Education:  Contact Attempts: Susa Simmonds, Brother [phone], (name of family member/significant other) has been identified by the patient as the family member/significant other with whom the patient will be residing, and identified as the person(s) who will aid the patient in the event of a mental health crisis.  With written consent from the patient, two attempts were made to provide suicide prevention education, prior to and/or following the patient's discharge.  We were unsuccessful in providing suicide prevention education.  A suicide education pamphlet was given to the patient to share with family/significant other.  Date and time of first attempt:10/22/17, 4:00pm Date and time of second attempt:  Glennon Mac 10/22/2017, 4:38 PM

## 2017-10-22 NOTE — Progress Notes (Signed)
Patient is banging on the windows at the nurses station demanding that staff clean up her room because it is our job to do so. Patient was asked by this writer to move her belongings to room 14 and she agreed to do so.

## 2017-10-22 NOTE — Progress Notes (Addendum)
Patient found in day room at back of hall upon my arrival. Remains on 1:1 in back of hallway behind locked door due to aggressive/inappropriate behavior which is continuing tonight. Upon introducing myself, patient states that I am not her nurse and that Mickeal Needy is her nurse. When writer attempted to explain that the shift had changed patient called me a liar and refused to speak to me. Patient refuses her CBG, states, "They took it already." While attempting to administer HS medication, patient is argumentative states, "I'm supposed to get 3 Navane pills, bitch. Get outta my face." Patient then slammed the door in writer's face. Refused to answer any assessment questions. Thus far, Patient has only been aggressive toward the writer and has been lying in bed awake since approximately 2030. Will continue to monitor throughout the shift.  Patient slept 7.25 hours. No apparent distress. Continues to refuse CBG. Will endorse care to oncoming shift.

## 2017-10-22 NOTE — Progress Notes (Addendum)
[facility] MD Progress Note  10/22/2017 1:23 PM Erica Mueller  MRN:  [id number] Subjective:  Pt continues to be irritable and demanding. She calls this provider a "bitch. Put me back on my Navane. Who do you think you are." Pt does not seem to remember our conversation about why we switched her medications due to Tardive Dyskinsia. She is very demanding and constantly at the nursing station. She is acting out and smeared feces on the doors and urinated on the floor intentionally because we will not discharge her.   Principal Problem: Schizophrenia (HCC) Diagnosis:   Patient Active Problem List   Diagnosis Date Noted  . Schizophrenia (HCC) [F20.9] 05/27/2017    Priority: High  . Schizoaffective disorder, depressive type (HCC) [F25.1] 04/02/2017  . Diabetes mellitus without complication (HCC) [E11.9] 04/02/2017  . Hypertension [I10] 04/02/2017  . Noncompliance [Z91.19] 04/02/2017   Total Time spent with patient: 15 minutes  Past Psychiatric History: See H&P  Past Medical History:  Past Medical History:  Diagnosis Date  . Coronary artery disease   . Depression   . Diabetes mellitus without complication (HCC)   . Elevated lipids   . GERD (gastroesophageal reflux disease)   . Hypertension   . Syphilis (acquired)     Past Surgical History:  Procedure Laterality Date  . COLONOSCOPY    . COLONOSCOPY WITH PROPOFOL N/A 09/01/2015   Procedure: COLONOSCOPY WITH PROPOFOL;  Surgeon: Wallace Cullens, MD;  Location: [facility] ENDOSCOPY;  Service: Gastroenterology;  Laterality: N/A;  . TUBAL LIGATION    . wisdom teeth removal     Family History: History reviewed. No pertinent family history. Family Psychiatric  History: See H&P Social History:  Social History   Substance and Sexual Activity  Alcohol Use Yes   Comment: occassionally     Social History   Substance and Sexual Activity  Drug Use No    Social History   Socioeconomic History  . Marital status: Divorced    Spouse name: Not on file  .  Number of children: Not on file  . Years of education: Not on file  . Highest education level: Not on file  Occupational History  . Not on file  Social Needs  . Financial resource strain: Not on file  . Food insecurity:    Worry: Not on file    Inability: Not on file  . Transportation needs:    Medical: Not on file    Non-medical: Not on file  Tobacco Use  . Smoking status: Current Every Day Smoker    Packs/day: 1.00    Types: Cigarettes  . Smokeless tobacco: Never Used  Substance and Sexual Activity  . Alcohol use: Yes    Comment: occassionally  . Drug use: No  . Sexual activity: Not on file  Lifestyle  . Physical activity:    Days per week: Not on file    Minutes per session: Not on file  . Stress: Not on file  Relationships  . Social connections:    Talks on phone: Not on file    Gets together: Not on file    Attends religious service: Not on file    Active member of club or organization: Not on file    Attends meetings of clubs or organizations: Not on file    Relationship status: Not on file  Other Topics Concern  . Not on file  Social History Narrative  . Not on file   Additional Social History:  Sleep: Good  Appetite:  Good  Current Medications: Current Facility-Administered Medications  Medication Dose Route Frequency Provider Last Rate Last Dose  . acetaminophen (TYLENOL) tablet 650 mg  650 mg Oral Q6H PRN Clapacs, John T, MD      . alum & mag hydroxide-simeth (MAALOX/MYLANTA) 200-200-20 MG/5ML suspension 30 mL  30 mL Oral Q4H PRN Clapacs, John T, MD      . amitriptyline (ELAVIL) tablet 25 mg  25 mg Oral QHS Ryker [place], Ileene Hutchinson, MD   25 mg at 10/21/17 2105  . amLODipine (NORVASC) tablet 5 mg  5 mg Oral Daily Penne Rosenstock, Ileene Hutchinson, MD   5 mg at 10/22/17 0803  . cloNIDine (CATAPRES) tablet 0.1 mg  0.1 mg Oral BID PRN Faelynn Wynder R, MD      . insulin aspart (novoLOG) injection 0-9 Units  0-9 Units Subcutaneous TID WC Clapacs, Jackquline Denmark,  MD   3 Units at 10/22/17 0802  . LORazepam (ATIVAN) tablet 0.5 mg  0.5 mg Oral Q6H PRN Haskell Riling, MD   0.5 mg at 10/19/17 0906  . magnesium hydroxide (MILK OF MAGNESIA) suspension 30 mL  30 mL Oral Daily PRN Clapacs, Jackquline Denmark, MD   30 mL at 10/22/17 0843  . [START ON 10/23/2017] metFORMIN (GLUCOPHAGE-XR) 24 hr tablet 500 mg  500 mg Oral Q breakfast Rondale Nies R, MD      . nicotine (NICODERM CQ - dosed in mg/24 hours) patch 21 mg  21 mg Transdermal Daily Clapacs, Jackquline Denmark, MD   21 mg at 10/22/17 0803  . OLANZapine (ZYPREXA) injection 5 mg  5 mg Intramuscular Q6H PRN Haskell Riling, MD   5 mg at 10/22/17 0951  . OLANZapine (ZYPREXA) tablet 5 mg  5 mg Oral Q6H PRN Lainee Lehrman, Ileene Hutchinson, MD   5 mg at 10/18/17 1357  . [START ON 10/23/2017] paliperidone (INVEGA SUSTENNA) injection 156 mg  156 mg Intramuscular Once Jaymarion Trombly R, MD      . pioglitazone (ACTOS) tablet 15 mg  15 mg Oral Daily Clapacs, Jackquline Denmark, MD   15 mg at 10/22/17 0803  . pravastatin (PRAVACHOL) tablet 20 mg  20 mg Oral q1800 Clapacs, John T, MD   20 mg at 10/21/17 1700  . thiothixene (NAVANE) capsule 2 mg  2 mg Oral BID Ailyne Pawley, Ileene Hutchinson, MD        Lab Results:  Results for orders placed or performed during the hospital encounter of 10/16/17 (from the past 48 hour(s))  Glucose, capillary     Status: Abnormal   Collection Time: 10/20/17  8:13 PM  Result Value Ref Range   Glucose-Capillary 203 (H) 65 - 99 mg/dL  Glucose, capillary     Status: Abnormal   Collection Time: 10/21/17  7:05 AM  Result Value Ref Range   Glucose-Capillary 165 (H) 65 - 99 mg/dL  Glucose, capillary     Status: Abnormal   Collection Time: 10/21/17  9:01 PM  Result Value Ref Range   Glucose-Capillary 224 (H) 65 - 99 mg/dL  Glucose, capillary     Status: Abnormal   Collection Time: 10/22/17  7:03 AM  Result Value Ref Range   Glucose-Capillary 211 (H) 65 - 99 mg/dL   Comment 1 Notify RN     Blood Alcohol level:  Lab Results  Component Value Date   ETH <10  10/15/2017   ETH <10 10/01/2017    Metabolic Disorder Labs: Lab Results  Component Value Date   HGBA1C 8.9 (  H) 10/16/2017   MPG 208.73 10/16/2017   MPG 143 05/27/2017   No results found for: PROLACTIN Lab Results  Component Value Date   CHOL 136 10/16/2017   TRIG 90 10/16/2017   HDL 56 10/16/2017   CHOLHDL 2.4 10/16/2017   VLDL 18 10/16/2017   LDLCALC 62 10/16/2017   LDLCALC 62 05/27/2017    Physical Findings: AIMS: Facial and Oral Movements Muscles of Facial Expression: Moderate Lips and Perioral Area: Moderate Jaw: Moderate Tongue: Moderate,Extremity Movements Upper (arms, wrists, hands, fingers): Moderate Lower (legs, knees, ankles, toes): Mild, Trunk Movements Neck, shoulders, hips: Mild, Overall Severity Severity of abnormal movements (highest score from questions above): Moderate Incapacitation due to abnormal movements: Mild Patient's awareness of abnormal movements (rate only patient's report): Aware, mild distress, Dental Status Current problems with teeth and/or dentures?: Yes Does patient usually wear dentures?: No  CIWA:    COWS:     Musculoskeletal: Strength & Muscle Tone: within normal limits Gait & Station: normal Patient leans: N/A  Psychiatric Specialty Exam: Physical Exam  Nursing note and vitals reviewed.   Review of Systems  All other systems reviewed and are negative.   Blood pressure 124/66, pulse 99, temperature 98.2 F (36.8 C), temperature source Oral, resp. rate 18, height 5\' 2"  (1.575 m), weight 68 kg (150 lb), SpO2 100 %.Body mass index is 27.44 kg/m.  General Appearance: Disheveled  Eye Contact:  Fair  Speech:  Slow  Volume:  Increased  Mood:  Irritable  Affect:  Labile  Thought Process:  Coherent  Orientation:  Full (Time, Place, and Person)  Thought Content:  Logical  Suicidal Thoughts:  No  Homicidal Thoughts:  No  Memory:  Immediate;   Poor  Judgement:  Impaired  Insight:  Lacking  Psychomotor Activity:  Normal   Concentration:  Concentration: Poor  Recall:  [employer] of Knowledge:  Fair  Language:  Fair  Akathisia:  No      Assets:  Resilience  ADL's:  Intact  Cognition:  Impaired,  Mild  Sleep:  Number of Hours: 5.3     Treatment Plan Summary: 67 yo female admitted due to agitation and paranoid. She has been irritable and very demanding to staff which is her baseline. She smeared feces and the doors and urinated on the floor. This appeared to be an intentional behavioral act and told staff she was going to do this if we did not discharge her. She was given IM medication to calm down. She did not call 911 overnight. She is not perseverative on people breaking into her house anymore. She will get second Invega injection tomorrow. She is adamant that she wants back on Navane. Will give her this medication while in the hospital to avoid her acting out but she is aware I will not be prescribing this when she leaves due to worsening TD.   Plan:  Schizophrenia -She will get second Invega Injection156 mg tomorrow -Start Navane 2 mg BID while in the hospital -Stop oral Risperdal -Multiple attempts to get EKG have failed. She refuses -prn Haldol, Ativan, Benadryl   Cognitive impairment -She has been refusing labs. Refuses to do MOCA as she is mostly yelling at this provider  Insomnia -Elavil 25 mg daily   HTN -BP better today -Continue Norvasc 5 mg daily  Dispo -She did sign consents and was referred to [facility] ACT team   Haskell Riling, MD 10/22/2017, 1:23 PM

## 2017-10-22 NOTE — Progress Notes (Signed)
Recreation Therapy Notes   Date: 10/22/2017  Time: 9:30 am   Location: Craft Room   Behavioral response: N/A   Intervention Topic:  Coping  Discussion/Intervention: Patient did not attend group.   Clinical Observations/Feedback:  Patient did not attend group.   Nigel Ericsson LRT/CTRS        Briann Sarchet 10/22/2017 10:46 AM

## 2017-10-22 NOTE — Progress Notes (Signed)
Security came and informed this Clinical research associate that patient had defecated and wiped it on the windows of the nurses station. Patient then proceeded to walk down the green hallway telling this writer "if you're not going to give me my Navane and Amitriptyline, I don't want it". This Clinical research associate tried to reassure patient that she was scheduled to get her medication this afternoon and at bedtime, but the patient told this writer to "shut up, I don't want to hear it". Patient proceeded to pull her pants down and lift up her shirt exposing herself in the hallway. Patient stated that she would take a shot for agitation. Once this Clinical research associate called patient into the medication room, she stated "you're not giving me a shot", and pulled down her pants and urinated on the floor in the medication room. Patient went to her room and this writer was able to administer her PRN IM Zyprexa. Patient stated "once I wake up, I'm going to do it again".

## 2017-10-23 LAB — GLUCOSE, CAPILLARY
GLUCOSE-CAPILLARY: 157 mg/dL — AB (ref 65–99)
Glucose-Capillary: 216 mg/dL — ABNORMAL HIGH (ref 65–99)
Glucose-Capillary: 292 mg/dL — ABNORMAL HIGH (ref 65–99)
Glucose-Capillary: 243 mg/dL — ABNORMAL HIGH (ref 65–99)

## 2017-10-23 NOTE — Progress Notes (Signed)
Patient is sitting in her room on her bed, no distress noted. Mental health tech on continuous 1:1. Patient is pleasant and expresses no concerns at this moment. Nurse will continue to monitor.

## 2017-10-23 NOTE — Progress Notes (Signed)
1500: Patient sitting in dayroom watching television. No distress noted, MHT monitoring 1:1 continuous. RN will continue to monitor.  1630 Patient sitting in day room, eating dinner, patient allowed for blood glucose to be monitored 157 patient received insulin 2 units of novolog insulin as well as Invega injection. Patient tolerated procedure well. MHT continues to monitor 1:1. Safety maintained  1700 Patient sitting in dayroom, watching television and looking at a newspaper. Patient states she will be going home soon and needs to grocery shopping when she is discharged. Nurse will continue to monitor. Safety maintained; MHT continues to monitor 1:1.

## 2017-10-23 NOTE — [facility] Group Notes (Signed)
LCSW Group Therapy Note  10/23/2017 1:00 pm  Type of Therapy/Topic:  Group Therapy:  Emotion Regulation  Participation Level:  Did Not Attend   Description of Group:    The purpose of this group is to assist patients in learning to regulate negative emotions and experience positive emotions. Patients will be guided to discuss ways in which they have been vulnerable to their negative emotions. These vulnerabilities will be juxtaposed with experiences of positive emotions or situations, and patients will be challenged to use positive emotions to combat negative ones. Special emphasis will be placed on coping with negative emotions in conflict situations, and patients will process healthy conflict resolution skills.  Therapeutic Goals: 1. Patient will identify two positive emotions or experiences to reflect on in order to balance out negative emotions 2. Patient will label two or more emotions that they find the most difficult to experience 3. Patient will demonstrate positive conflict resolution skills through discussion and/or role plays  Summary of Patient Progress:  Erica Mueller was invited to today's group, but chose not to attend.    Therapeutic Modalities:   Cognitive Behavioral Therapy Feelings Identification Dialectical Behavioral Therapy

## 2017-10-23 NOTE — Plan of Care (Signed)
Patient is alert and oriented. Denies SI, HI and AVH. Patient refused blood glucose reading initially but later patient agreed to participate. Patient can be very pleasant at times but it can change where she is very irritable and angry. Patient does not do well with being redirecting. Patient received 3 units of insulin this morning with reading of 216. Patient is able to maintain adequate nutrition. Patient is not able to express feelings and use coping skills. Nurse will encourage patient to make wise decisions. Patient is currently on locked back hall due to unruly behaviors. Patient has a MHT at all times monitoring. Patient is not in distress. Nurse will continue to monitor. Problem: Activity: Goal: Will verbalize the importance of balancing activity with adequate rest periods Outcome: Progressing   Problem: Education: Goal: Will be free of psychotic symptoms Outcome: Not Progressing Goal: Knowledge of the prescribed therapeutic regimen will improve Outcome: Progressing   Problem: Coping: Goal: Coping ability will improve Outcome: Not Progressing Goal: Will verbalize feelings Outcome: Progressing   Problem: Health Behavior/Discharge Planning: Goal: Compliance with prescribed medication regimen will improve Outcome: Not Progressing   Problem: Nutritional: Goal: Ability to achieve adequate nutritional intake will improve Outcome: Progressing   Problem: Role Relationship: Goal: Ability to communicate needs accurately will improve Outcome: Not Progressing Goal: Ability to interact with others will improve Outcome: Not Progressing   Problem: Safety: Goal: Ability to redirect hostility and anger into socially appropriate behaviors will improve Outcome: Not Progressing Goal: Ability to remain free from injury will improve Outcome: Progressing   Problem: Self-Care: Goal: Ability to participate in self-care as condition permits will improve Outcome: Progressing   Problem:  Education: Goal: Knowledge of [place] [facility] Education information/materials will improve Outcome: Progressing Goal: Emotional status will improve Outcome: Not Progressing Goal: Mental status will improve Outcome: Not Progressing Goal: Verbalization of understanding the information provided will improve Outcome: Not Progressing   Problem: Activity: Goal: Interest or engagement in activities will improve Outcome: Not Progressing Goal: Sleeping patterns will improve Outcome: Not Progressing   Problem: Activity: Goal: Will identify at least one activity in which they can participate Outcome: Not Progressing   Problem: Self-Concept: Goal: Will verbalize positive feelings about self Outcome: Not Progressing   Problem: Coping: Goal: Ability to adjust to condition or change in health will improve Outcome: Not Progressing

## 2017-10-23 NOTE — Progress Notes (Addendum)
Patient found in bed awake upon my arrival. Patient is interactive with staff. Patient shows progression in ability to control herself. Although she remains labile, periods of aggression and resistance to care are decreased in frequency, intensity, and duration. Compliance with staff direction is improved. Denies SI/HI/AVH. Thought process is more organized. Hygiene is improved. Aggressive actions/threats are primarily behavioral in nature. No signs of psychosis are evident this evening. Remains in room with sitter in close proximity. Patient is compliant with HS medications and CBG tonight. CBG 243, no coverage scheduled. Q 15 minute checks maintained. Will continue to monitor throughout the shift.  Patient slept 7.75 hours. No apparent distress. Sitter present. Woke in good mood. Pleasant and cooperative. CBG 228, 3 units coverage administered. Tolerated well. Will endorse care to oncoming shift.

## 2017-10-23 NOTE — Progress Notes (Signed)
11am-12 Patient in day room watching television, No distress noted. MHT present. Patient able to take 5 units of inulin, and eat lunch. Nurse will continue to monitor.

## 2017-10-23 NOTE — Progress Notes (Signed)
[facility] MD Progress Note  10/23/2017 2:13 PM Erica Mueller  MRN:  [id number] Subjective:  Pt is much calmer today. She is pleasant with this provider. She apologizes for "being mean to everyone. I guess I needed to get it out of my system." She was very irrigable with her nurse last night. She is eating well. She is fully oriented to date and president, and place. She is glad that she was restarted on Navane. She did not endorse SI or HI, AH, or VH.   Principal Problem: Schizophrenia (HCC) Diagnosis:   Patient Active Problem List   Diagnosis Date Noted  . Schizophrenia (HCC) [F20.9] 05/27/2017    Priority: High  . Schizoaffective disorder, depressive type (HCC) [F25.1] 04/02/2017  . Diabetes mellitus without complication (HCC) [E11.9] 04/02/2017  . Hypertension [I10] 04/02/2017  . Noncompliance [Z91.19] 04/02/2017   Total Time spent with patient: 20 minutes  Past Psychiatric History: See H&P  Past Medical History:  Past Medical History:  Diagnosis Date  . Coronary artery disease   . Depression   . Diabetes mellitus without complication (HCC)   . Elevated lipids   . GERD (gastroesophageal reflux disease)   . Hypertension   . Syphilis (acquired)     Past Surgical History:  Procedure Laterality Date  . COLONOSCOPY    . COLONOSCOPY WITH PROPOFOL N/A 09/01/2015   Procedure: COLONOSCOPY WITH PROPOFOL;  Surgeon: Wallace Cullens, MD;  Location: [facility] ENDOSCOPY;  Service: Gastroenterology;  Laterality: N/A;  . TUBAL LIGATION    . wisdom teeth removal     Family History: History reviewed. No pertinent family history. Family Psychiatric  History: See H&o Social History:  Social History   Substance and Sexual Activity  Alcohol Use Yes   Comment: occassionally     Social History   Substance and Sexual Activity  Drug Use No    Social History   Socioeconomic History  . Marital status: Divorced    Spouse name: Not on file  . Number of children: Not on file  . Years of education: Not  on file  . Highest education level: Not on file  Occupational History  . Not on file  Social Needs  . Financial resource strain: Not on file  . Food insecurity:    Worry: Not on file    Inability: Not on file  . Transportation needs:    Medical: Not on file    Non-medical: Not on file  Tobacco Use  . Smoking status: Current Every Day Smoker    Packs/day: 1.00    Types: Cigarettes  . Smokeless tobacco: Never Used  Substance and Sexual Activity  . Alcohol use: Yes    Comment: occassionally  . Drug use: No  . Sexual activity: Not on file  Lifestyle  . Physical activity:    Days per week: Not on file    Minutes per session: Not on file  . Stress: Not on file  Relationships  . Social connections:    Talks on phone: Not on file    Gets together: Not on file    Attends religious service: Not on file    Active member of club or organization: Not on file    Attends meetings of clubs or organizations: Not on file    Relationship status: Not on file  Other Topics Concern  . Not on file  Social History Narrative  . Not on file   Additional Social History:  Sleep: Good  Appetite:  Good  Current Medications: Current Facility-Administered Medications  Medication Dose Route Frequency Provider Last Rate Last Dose  . acetaminophen (TYLENOL) tablet 650 mg  650 mg Oral Q6H PRN Clapacs, John T, MD      . alum & mag hydroxide-simeth (MAALOX/MYLANTA) 200-200-20 MG/5ML suspension 30 mL  30 mL Oral Q4H PRN Clapacs, John T, MD      . amitriptyline (ELAVIL) tablet 25 mg  25 mg Oral QHS Deysha Cartier, Ileene Hutchinson, MD   25 mg at 10/21/17 2105  . amLODipine (NORVASC) tablet 5 mg  5 mg Oral Daily Vannessa Godown, Ileene Hutchinson, MD   5 mg at 10/23/17 0802  . cloNIDine (CATAPRES) tablet 0.1 mg  0.1 mg Oral BID PRN Ninel Abdella, Ileene Hutchinson, MD      . diphenhydrAMINE (BENADRYL) capsule 50 mg  50 mg Oral Q6H PRN Yaileen Hofferber, Ileene Hutchinson, MD       Or  . diphenhydrAMINE (BENADRYL) injection 50 mg  50 mg  Intramuscular Q6H PRN Alexandria Current, Ileene Hutchinson, MD      . haloperidol (HALDOL) tablet 5 mg  5 mg Oral Q6H PRN Dejha King, Ileene Hutchinson, MD       Or  . haloperidol lactate (HALDOL) injection 5 mg  5 mg Intramuscular Q6H PRN Kalie Cabral R, MD      . insulin aspart (novoLOG) injection 0-9 Units  0-9 Units Subcutaneous TID WC Clapacs, Jackquline Denmark, MD   5 Units at 10/23/17 1145  . LORazepam (ATIVAN) tablet 1 mg  1 mg Oral Q6H PRN Nevae Pinnix, Ileene Hutchinson, MD       Or  . LORazepam (ATIVAN) injection 1 mg  1 mg Intramuscular Q6H PRN Anyla Israelson R, MD      . magnesium hydroxide (MILK OF MAGNESIA) suspension 30 mL  30 mL Oral Daily PRN Clapacs, Jackquline Denmark, MD   30 mL at 10/22/17 1817  . metFORMIN (GLUCOPHAGE-XR) 24 hr tablet 500 mg  500 mg Oral Q breakfast Harue Pribble, Ileene Hutchinson, MD   500 mg at 10/23/17 0802  . nicotine (NICODERM CQ - dosed in mg/24 hours) patch 21 mg  21 mg Transdermal Daily Clapacs, Jackquline Denmark, MD   21 mg at 10/23/17 0809  . paliperidone (INVEGA SUSTENNA) injection 156 mg  156 mg Intramuscular Once Jettson Crable R, MD      . pioglitazone (ACTOS) tablet 15 mg  15 mg Oral Daily Clapacs, Jackquline Denmark, MD   15 mg at 10/23/17 0802  . pravastatin (PRAVACHOL) tablet 20 mg  20 mg Oral q1800 Clapacs, Jackquline Denmark, MD   20 mg at 10/22/17 1758  . thiothixene (NAVANE) capsule 2 mg  2 mg Oral BID Jesenia Spera, Ileene Hutchinson, MD   2 mg at 10/23/17 0802    Lab Results:  Results for orders placed or performed during the hospital encounter of 10/16/17 (from the past 48 hour(s))  Glucose, capillary     Status: Abnormal   Collection Time: 10/21/17  9:01 PM  Result Value Ref Range   Glucose-Capillary 224 (H) 65 - 99 mg/dL  Glucose, capillary     Status: Abnormal   Collection Time: 10/22/17  7:03 AM  Result Value Ref Range   Glucose-Capillary 211 (H) 65 - 99 mg/dL   Comment 1 Notify RN   Glucose, capillary     Status: Abnormal   Collection Time: 10/22/17  6:07 PM  Result Value Ref Range   Glucose-Capillary 291 (H) 65 - 99 mg/dL  Glucose, capillary  Status: Abnormal    Collection Time: 10/23/17  8:23 AM  Result Value Ref Range   Glucose-Capillary 216 (H) 65 - 99 mg/dL  Glucose, capillary     Status: Abnormal   Collection Time: 10/23/17 11:36 AM  Result Value Ref Range   Glucose-Capillary 292 (H) 65 - 99 mg/dL    Blood Alcohol level:  Lab Results  Component Value Date   ETH <10 10/15/2017   ETH <10 10/01/2017    Metabolic Disorder Labs: Lab Results  Component Value Date   HGBA1C 8.9 (H) 10/16/2017   MPG 208.73 10/16/2017   MPG 143 05/27/2017   No results found for: PROLACTIN Lab Results  Component Value Date   CHOL 136 10/16/2017   TRIG 90 10/16/2017   HDL 56 10/16/2017   CHOLHDL 2.4 10/16/2017   VLDL 18 10/16/2017   LDLCALC 62 10/16/2017   LDLCALC 62 05/27/2017    Physical Findings: AIMS: Facial and Oral Movements Muscles of Facial Expression: Moderate Lips and Perioral Area: Moderate Jaw: Moderate Tongue: Moderate,Extremity Movements Upper (arms, wrists, hands, fingers): Moderate Lower (legs, knees, ankles, toes): Mild, Trunk Movements Neck, shoulders, hips: Mild, Overall Severity Severity of abnormal movements (highest score from questions above): Moderate Incapacitation due to abnormal movements: Mild Patient's awareness of abnormal movements (rate only patient's report): Aware, mild distress, Dental Status Current problems with teeth and/or dentures?: Yes Does patient usually wear dentures?: No  CIWA:    COWS:     Musculoskeletal: Strength & Muscle Tone: within normal limits Gait & Station: normal Patient leans: N/A  Psychiatric Specialty Exam: Physical Exam  ROS  Blood pressure 122/65, pulse 82, temperature 98.2 F (36.8 C), resp. rate 18, height 5\' 2"  (1.575 m), weight 68 kg (150 lb), SpO2 100 %.Body mass index is 27.44 kg/m.  General Appearance: Casual  Eye Contact:  Fair  Speech:  Clear and Coherent  Volume:  Normal  Mood:  Euthymic  Affect:  Appropriate  Thought Process:  Coherent and Goal Directed   Orientation:  Full (Time, Place, and Person)  Thought Content:  Logical  Suicidal Thoughts:  No  Homicidal Thoughts:  No  Memory:  Immediate;   Poor  Judgement:  Impaired  Insight:  Lacking  Psychomotor Activity:  Normal  Concentration:  Concentration: Fair  Recall:  [employer] of Knowledge:  Fair  Language:  Fair  Akathisia:  No      Assets:  Resilience  ADL's:  Intact  Cognition:  Impaired,  Mild  Sleep:  Number of Hours: 7.25     Treatment Plan Summary: 68 yo female admitted due to paranoia. She has episodes of agitation and cussing at staff. She is much calmer during evaluation today and is pleasant with this provider today compared to yesterday. She is fully oriented today. We discussed medications. She agrees to getting 2nd invega injection today. She is glad to be back on Navane. We discussed about TD and that this will likely worsen these symptoms. She still wants to continue with it. She is usually not compliant with medications outside of the hospital.   Plan:  Schizophrenia -Second Invega injection due today -Continue Navane 2 mg BID. Discussed that this will likely worsen TD but she still wants to continue. She is not usually compliant with medications at home -Multiple attempts to get EKG have failed  Cognitive impairment -Refusing labs. She is fully oriented today and caring for her ADLs  HTN -BP good today -Continue Norvasc 5 mg daily  Dispo -ACT  team coming to evaluate her today. Likely discharge tomorrow   Haskell Riling, MD 10/23/2017, 2:13 PM

## 2017-10-23 NOTE — Progress Notes (Addendum)
2000- Patient lying in bed awake. Sitter present. 2100- Patient interacting with staff. One verbal outburst reported. Patient threatened to defecate on the floor. Sitter present. 2200- Patient lying in bed awake. Sitter present. 2300- Patient sleeping. Sitter present. 0000- Patient sleeping. Sitter present. 0100- Patient sleeping. Sitter present. 0200- Patient sleeping. Sitter present. 0300- Patient sleeping. Sitter present. 0400- Patient sleeping. Sitter present. 0500- Patient sleeping. Sitter present. 0600- Compliant with VS at bedside. Sitter present. 0700- Compliant with am CBG. CBG 228, given 3 units Novolog coverage. Oncoming RN informed. Sitter present.

## 2017-10-23 NOTE — Plan of Care (Signed)
Patient shows progression in ability to control herself. Although she remains labile, periods of aggression and resistance to care are decreased in frequency, intensity, and duration. Compliance with staff direction is improved.  Problem: Education: Goal: Will be free of psychotic symptoms Outcome: Progressing Goal: Knowledge of the prescribed therapeutic regimen will improve Outcome: Progressing   Problem: Health Behavior/Discharge Planning: Goal: Compliance with prescribed medication regimen will improve Outcome: Progressing   Problem: Role Relationship: Goal: Ability to communicate needs accurately will improve Outcome: Progressing Goal: Ability to interact with others will improve Outcome: Progressing   Problem: Safety: Goal: Ability to redirect hostility and anger into socially appropriate behaviors will improve Outcome: Progressing   Problem: Education: Goal: Knowledge of [place] [facility] Education information/materials will improve Outcome: Progressing Goal: Mental status will improve Outcome: Progressing   Problem: Activity: Goal: Sleeping patterns will improve Outcome: Progressing   Problem: Coping: Goal: Ability to demonstrate self-control will improve Outcome: Progressing

## 2017-10-24 LAB — GLUCOSE, CAPILLARY: Glucose-Capillary: 228 mg/dL — ABNORMAL HIGH (ref 65–99)

## 2017-10-24 MED ORDER — PIOGLITAZONE HCL 15 MG PO TABS: 15.0000 mg | ORAL_TABLET | Freq: Every day | ORAL | 0 refills | Status: DC

## 2017-10-24 MED ORDER — AMITRIPTYLINE HCL 25 MG PO TABS: 25.0000 mg | ORAL_TABLET | Freq: Every day | ORAL | 0 refills | Status: DC

## 2017-10-24 MED ORDER — PALIPERIDONE PALMITATE ER 234 MG/1.5ML IM SUSY: 234.0000 mg | PREFILLED_SYRINGE | Freq: Once | INTRAMUSCULAR | 0 refills | Status: DC

## 2017-10-24 MED ORDER — AMLODIPINE BESYLATE 5 MG PO TABS: 5.0000 mg | ORAL_TABLET | Freq: Every day | ORAL | 0 refills | Status: DC

## 2017-10-24 MED ORDER — THIOTHIXENE 2 MG PO CAPS: 2.0000 mg | ORAL_CAPSULE | Freq: Two times a day (BID) | ORAL | 0 refills | Status: DC

## 2017-10-24 NOTE — B[facility] Suicide Risk Assessment (Signed)
[facility] Discharge Suicide Risk Assessment   Principal Problem: Schizophrenia [facility]) Discharge Diagnoses:  Patient Active Problem List   Diagnosis Date Noted  . Schizophrenia (HCC) [F20.9] 05/27/2017    Priority: High  . Schizoaffective disorder, depressive type (HCC) [F25.1] 04/02/2017  . Diabetes mellitus without complication (HCC) [E11.9] 04/02/2017  . Hypertension [I10] 04/02/2017  . Noncompliance [Z91.19] 04/02/2017    Mental Status Per Nursing Assessment::   On Admission:     Demographic Factors:  Age 67 or older and Living alone  Loss Factors: NA  Historical Factors: Impulsivity  Risk Reduction Factors:   Positive social support  Continued Clinical Symptoms:  Schizophrenia:   Paranoid or undifferentiated type Previous Psychiatric Diagnoses and Treatments  Cognitive Features That Contribute To Risk:  Polarized thinking    Suicide Risk:  Minimal: No identifiable suicidal ideation.      Plan Of Care/Follow-up recommendations:  ACT team  Haskell Riling, MD 10/24/2017, 8:22 AM

## 2017-10-24 NOTE — BHH Group Notes (Signed)
CSW Group Therapy Note  10/24/2017  Time:  0900  Type of Therapy and Topic: Group Therapy: Goals Group: SMART Goals    Participation Level:  Did Not Attend    Description of Group:   The purpose of a daily goals group is to assist and guide patients in setting recovery/wellness-related goals. The objective is to set goals as they relate to the crisis in which they were admitted. Patients will be using SMART goal modalities to set measurable goals. Characteristics of realistic goals will be discussed and patients will be assisted in setting and processing how one will reach their goal. Facilitator will also assist patients in applying interventions and coping skills learned in psycho-education groups to the SMART goal and process how one will achieve defined goal.    Therapeutic Goals:  -Patients will develop and document one goal related to or their crisis in which brought them into treatment.  -Patients will be guided by LCSW using SMART goal setting modality in how to set a measurable, attainable, realistic and time sensitive goal.  -Patients will process barriers in reaching goal.  -Patients will process interventions in how to overcome and successful in reaching goal.    Patient's Goal:  Pt was invited to attend group but chose not to attend. CSW will continue to encourage pt to attend group throughout their admission.     Therapeutic Modalities:  Motivational Interviewing  Cognitive Behavioral Therapy  Crisis Intervention Model  SMART goals setting  Cheyne Boulden, MSW, LCSW Clinical Social Worker 10/24/2017 9:40 AM   

## 2017-10-24 NOTE — Progress Notes (Signed)
  [facility] Adult Case Management Discharge Plan :  Will you be returning to the same living situation after discharge:  Yes,  Pt will be returning to her home. At discharge, do you have transportation home?: Yes,  Pt will be transported back to her home via public transportation. Do you have the ability to pay for your medications: Yes,  Pt has health insurance and financial means to pay for her medications.  Release of information consent forms completed and in the chart;  Patient's signature needed at discharge.  Patient to Follow up at: Follow-up Information    [address] [facility] Follow up on 11/05/2017.   Why:  You will be meeting with [place] at the office at 10:00 AM to complete the paperwork for ACT services. Contact information: [address] [place] [address] [place] [address] [phone]           Next level of care provider has access to [facility] Link:no  Safety Planning and Suicide Prevention discussed: Yes,  No safety issues noted.  Have you used any form of tobacco in the last 30 days? (Cigarettes, Smokeless Tobacco, Cigars, and/or Pipes): Yes  Has patient been referred to the Quitline?: Patient refused referral  Patient has been referred for addiction treatment: N/A  Alease Frame, LCSW 10/24/2017, 12:18 PM
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
## 2017-10-24 NOTE — Progress Notes (Signed)
Recreation Therapy Notes  INPATIENT RECREATION TR PLAN  Patient Details Name: Erica Mueller MRN: [id number] DOB: 11/02/1950 Today's Date: 10/24/2017  Rec Therapy Plan Is patient appropriate for Therapeutic Recreation?: Yes Treatment times per week: at least 3 Estimated Length of Stay: 5-7 days TR Treatment/Interventions: Group participation (Comment)  Discharge Criteria Pt will be discharged from therapy if:: Discharged Treatment plan/goals/alternatives discussed and agreed upon by:: Patient/family  Discharge Summary Short term goals set: Patient will engage in groups without prompting or encouragement from LRT x3 group sessions within 5 recreation therapy group sessions Short term goals met: Not met Reason goals not met: Patient did not attend any groups Therapeutic equipment acquired: N/A Reason patient discharged from therapy: Discharge from hospital Pt/family agrees with progress & goals achieved: Yes Date patient discharged from therapy: 10/24/17   Bettejane Leavens 10/24/2017, 3:51 PM

## 2017-10-24 NOTE — Progress Notes (Signed)
Received Erica Mueller this Am in the dayroom, she was compliant with her medications, ate breakfast and looking forward to going home today.She denied all of the psychiatric symptoms. Her brother [name] will pick her up.She remains secluded and her sitter is at the bedside. She was discharge with her female relative without incident.

## 2017-10-24 NOTE — Progress Notes (Signed)
Recreation Therapy Notes  Date: 10/24/2017  Time: 9:30 am   Location: Craft Room   Behavioral response: N/A   Intervention Topic:  Animal Assisted Therapy  Discussion/Intervention: Patient did not attend group.   Clinical Observations/Feedback:  Patient did not attend group.   Lynda Capistran LRT/CTRS        Loryn Haacke 10/24/2017 10:38 AM

## 2017-11-26 ENCOUNTER — Ambulatory Visit: Payer: Self-pay | Admitting: Family Medicine

## 2017-12-13 ENCOUNTER — Ambulatory Visit: Payer: Self-pay | Admitting: Family Medicine

## 2017-12-20 ENCOUNTER — Ambulatory Visit (INDEPENDENT_AMBULATORY_CARE_PROVIDER_SITE_OTHER): Payer: Medicare Other | Admitting: Family Medicine

## 2017-12-20 ENCOUNTER — Encounter: Payer: Self-pay | Admitting: Family Medicine

## 2017-12-20 ENCOUNTER — Telehealth: Payer: Self-pay

## 2017-12-20 ENCOUNTER — Encounter: Payer: Self-pay | Admitting: Emergency Medicine

## 2017-12-20 ENCOUNTER — Other Ambulatory Visit: Payer: Self-pay

## 2017-12-20 ENCOUNTER — Emergency Department
Admission: EM | Admit: 2017-12-20 | Discharge: 2017-12-20 | Disposition: A | Discharge status: Home / Self Care | Payer: Medicare Other | Attending: Emergency Medicine | Admitting: Emergency Medicine

## 2017-12-20 VITALS — BP 120/78 | HR 78 | Resp 16 | Ht 62.0 in | Wt 127.0 lb

## 2017-12-20 DIAGNOSIS — F69 Unspecified disorder of adult personality and behavior: Secondary | ICD-10-CM

## 2017-12-20 DIAGNOSIS — R739 Hyperglycemia, unspecified: Secondary | ICD-10-CM | POA: Diagnosis present

## 2017-12-20 DIAGNOSIS — F209 Schizophrenia, unspecified: Secondary | ICD-10-CM | POA: Diagnosis not present

## 2017-12-20 DIAGNOSIS — E785 Hyperlipidemia, unspecified: Secondary | ICD-10-CM | POA: Diagnosis not present

## 2017-12-20 DIAGNOSIS — F17210 Nicotine dependence, cigarettes, uncomplicated: Secondary | ICD-10-CM | POA: Diagnosis not present

## 2017-12-20 DIAGNOSIS — Z7689 Persons encountering health services in other specified circumstances: Secondary | ICD-10-CM

## 2017-12-20 DIAGNOSIS — I119 Hypertensive heart disease without heart failure: Secondary | ICD-10-CM | POA: Insufficient documentation

## 2017-12-20 DIAGNOSIS — Z79899 Other long term (current) drug therapy: Secondary | ICD-10-CM | POA: Diagnosis not present

## 2017-12-20 DIAGNOSIS — Z7984 Long term (current) use of oral hypoglycemic drugs: Secondary | ICD-10-CM | POA: Insufficient documentation

## 2017-12-20 DIAGNOSIS — I10 Essential (primary) hypertension: Secondary | ICD-10-CM

## 2017-12-20 DIAGNOSIS — I2510 Atherosclerotic heart disease of native coronary artery without angina pectoris: Secondary | ICD-10-CM | POA: Insufficient documentation

## 2017-12-20 DIAGNOSIS — Z9114 Patient's other noncompliance with medication regimen: Secondary | ICD-10-CM

## 2017-12-20 DIAGNOSIS — Z91199 Patient's noncompliance with other medical treatment and regimen due to unspecified reason: Secondary | ICD-10-CM

## 2017-12-20 DIAGNOSIS — E1165 Type 2 diabetes mellitus with hyperglycemia: Secondary | ICD-10-CM | POA: Insufficient documentation

## 2017-12-20 DIAGNOSIS — E119 Type 2 diabetes mellitus without complications: Secondary | ICD-10-CM

## 2017-12-20 DIAGNOSIS — Z9119 Patient's noncompliance with other medical treatment and regimen: Secondary | ICD-10-CM | POA: Diagnosis not present

## 2017-12-20 DIAGNOSIS — K219 Gastro-esophageal reflux disease without esophagitis: Secondary | ICD-10-CM | POA: Insufficient documentation

## 2017-12-20 LAB — URINALYSIS, COMPLETE (UACMP) WITH MICROSCOPIC
BACTERIA UA: NONE SEEN
BILIRUBIN URINE: NEGATIVE
Hgb urine dipstick: NEGATIVE
Ketones, ur: NEGATIVE mg/dL
NITRITE: NEGATIVE
Protein, ur: NEGATIVE mg/dL
SPECIFIC GRAVITY, URINE: 1.023 (ref 1.005–1.030)
pH: 6 (ref 5.0–8.0)

## 2017-12-20 LAB — BASIC METABOLIC PANEL
Anion gap: 7 (ref 5–15)
BUN: 12 mg/dL (ref 8–23)
CALCIUM: 9.5 mg/dL (ref 8.9–10.3)
CO2: 28 mmol/L (ref 22–32)
CREATININE: 0.67 mg/dL (ref 0.44–1.00)
Chloride: 103 mmol/L (ref 98–111)
GFR calc Af Amer: >60 mL/min (ref >60)
GLUCOSE: 328 mg/dL — AB (ref 70–99)
POTASSIUM: 3.8 mmol/L (ref 3.5–5.1)
SODIUM: 138 mmol/L (ref 135–145)

## 2017-12-20 LAB — GLUCOSE, POCT (MANUAL RESULT ENTRY): POC Glucose: 446 mg/dl — AB (ref 70–99)

## 2017-12-20 LAB — CBC
HEMATOCRIT: 38.5 % (ref 35.0–47.0)
Hemoglobin: 12.8 g/dL (ref 12.0–16.0)
MCH: 30.2 pg (ref 26.0–34.0)
MCHC: 33.2 g/dL (ref 32.0–36.0)
MCV: 90.9 fL (ref 80.0–100.0)
PLATELETS: 215 10*3/uL (ref 150–440)
RBC: 4.24 MIL/uL (ref 3.80–5.20)
RDW: 13.6 % (ref 11.5–14.5)
WBC: 4.3 10*3/uL (ref 3.6–11.0)

## 2017-12-20 LAB — GLUCOSE, CAPILLARY: Glucose-Capillary: 303 mg/dL — ABNORMAL HIGH (ref 70–99)

## 2017-12-20 MED ORDER — METFORMIN HCL 500 MG PO TABS: 500.0000 mg | ORAL_TABLET | Freq: Two times a day (BID) | ORAL | 2 refills | Status: DC

## 2017-12-20 MED ORDER — SODIUM CHLORIDE 0.9 % IV BOLUS
1000.0000 mL | Freq: Once | INTRAVENOUS | Status: AC
  Administered 2017-12-20: 1000 mL via INTRAVENOUS

## 2017-12-20 MED ORDER — AMLODIPINE BESYLATE 5 MG PO TABS: 5.0000 mg | ORAL_TABLET | Freq: Every day | ORAL | 2 refills | Status: DC

## 2017-12-20 MED ORDER — METFORMIN HCL 500 MG PO TABS
500.0000 mg | ORAL_TABLET | Freq: Once | ORAL | Status: AC
  Administered 2017-12-20: 500 mg via ORAL
  Filled 2017-12-20: qty 1

## 2017-12-20 MED ORDER — LOVASTATIN 40 MG PO TABS: 40.0000 mg | ORAL_TABLET | Freq: Every day | ORAL | 2 refills | Status: AC

## 2017-12-20 MED ORDER — PIOGLITAZONE HCL 15 MG PO TABS: 15.0000 mg | ORAL_TABLET | Freq: Every day | ORAL | 2 refills | Status: DC

## 2017-12-20 NOTE — Telephone Encounter (Signed)
Associated w/[facility] 12/20/2017: Patient is refusing EMS transport recommended by PCP after BS result of 446 mg/dl. Patient has not been on Metformin since May after being dismissed from Primary Care for multiple no shows. Upon leaving patient agreed to have Transportation take her to [facility] ER. I called them and they agreed to change drop off location. Irwin Brakeman is CMA who walked her down to meet bus and will explain that she has to go to [facility] ER as well.

## 2017-12-20 NOTE — Assessment & Plan Note (Addendum)
Noncontrol Noncompliant Uncertain baseline Fingerstick 446 mg % refill pioglitazone 15mg / metformen 500mg  bid/ to er for eval

## 2017-12-20 NOTE — ED Provider Notes (Addendum)
[facility] Emergency Department Provider Note  ____________________________________________   First MD Initiated Contact with Patient 12/20/17 1921     (approximate)  I have reviewed the triage vital signs and the nursing notes.   HISTORY  Chief Complaint Hyperglycemia  Level 5 caveat:  history/ROS limited by chronic mental illness and being a vague historian   HPI Erica Mueller is a 67 y.o. female with an extensive chronic psychiatric history as well as medical issues as described below who presents for evaluation of hyperglycemia.  Reportedly she called EMS because her blood sugar was greater than 500 at home.  The patient denies any symptoms.  She states that she was having a "down episode" recently where she stopped taking her medications.  She is feeling better now but was worried about her blood sugar.  She had asked for something to eat and I gave her some saltines and diet ginger ale which she has been eating and drinking without any difficulty.  She denies chest pain, shortness of breath, nausea, vomiting, and abdominal pain.  She has for a blood sugar monitor to go home with which we explained we are not able to provide.  Her symptoms were gradual in onset and she thought they were severe but her blood sugar was not as elevated in the emergency department as she thought it was at home.  Past Medical History:  Diagnosis Date  . Coronary artery disease   . Depression   . Diabetes mellitus without complication (HCC)   . Elevated lipids   . GERD (gastroesophageal reflux disease)   . Hypertension   . Syphilis (acquired)     Patient Active Problem List   Diagnosis Date Noted  . GERD (gastroesophageal reflux disease) 12/20/2017  . Hyperlipemia 12/20/2017  . History of schizophrenia 07/30/2017  . Schizophrenia (HCC) 05/27/2017  . Diabetes (HCC) 04/02/2017  . Hypertension 04/02/2017  . Noncompliance 04/02/2017  . Depression 11/17/2013    Past  Surgical History:  Procedure Laterality Date  . COLONOSCOPY    . COLONOSCOPY WITH PROPOFOL N/A 09/01/2015   Procedure: COLONOSCOPY WITH PROPOFOL;  Surgeon: Wallace Cullens, MD;  Location: [facility] ENDOSCOPY;  Service: Gastroenterology;  Laterality: N/A;  . TUBAL LIGATION    . wisdom teeth removal      Prior to Admission medications   Medication Sig Start Date End Date Taking? Authorizing Provider  amitriptyline (ELAVIL) 25 MG tablet Take 1 tablet (25 mg total) by mouth at bedtime. 10/24/17   McNew, Ileene Hutchinson, MD  amLODipine (NORVASC) 5 MG tablet Take 1 tablet (5 mg total) by mouth daily. 12/20/17   Duanne Limerick, MD  lovastatin (MEVACOR) 40 MG tablet Take 1 tablet (40 mg total) by mouth at bedtime. 12/20/17   Duanne Limerick, MD  metFORMIN (GLUCOPHAGE) 500 MG tablet Take 1 tablet (500 mg total) by mouth 2 (two) times daily with a meal. 12/20/17   Loleta Rose, MD  paliperidone (INVEGA SUSTENNA) 234 MG/1.5ML SUSY injection Inject 234 mg into the muscle once for 1 dose. Next injection due 11/20/17 11/20/17 11/20/17  McNew, Ileene Hutchinson, MD  pioglitazone (ACTOS) 15 MG tablet Take 1 tablet (15 mg total) by mouth daily. 12/20/17   Duanne Limerick, MD  thiothixene (NAVANE) 2 MG capsule Take 1 capsule (2 mg total) by mouth 2 (two) times daily. 10/24/17   McNew, Ileene Hutchinson, MD    Allergies Patient has no known allergies.  Family History  Problem Relation Age of Onset  .  Cancer Mother   . Alcoholism Mother   . Bone cancer Father   . Heart disease Father   . Diabetes Maternal Grandmother   . Suicidality Paternal Uncle     Social History Social History   Tobacco Use  . Smoking status: Current Every Day Smoker    Packs/day: 1.00    Years: 48.00    Pack years: 48.00    Types: Cigarettes    Start date: 12/20/1969  . Smokeless tobacco: Never Used  Substance Use Topics  . Alcohol use: Yes    Alcohol/week: 1.8 oz    Types: 2 Cans of beer, 1 Shots of liquor per week  . Drug use: Not Currently    Types: Marijuana      Comment: PAST     Review of Systems Constitutional: No fever/chills Eyes: No visual changes. ENT: No sore throat. Cardiovascular: Denies chest pain. Respiratory: Denies shortness of breath. Gastrointestinal: No abdominal pain.  No nausea, no vomiting.  No diarrhea.  No constipation. Genitourinary: Negative for dysuria. Musculoskeletal: Negative for neck pain.  Negative for back pain. Integumentary: Negative for rash. Neurological: Negative for headaches, focal weakness or numbness.   ____________________________________________   PHYSICAL EXAM:  VITAL SIGNS: ED Triage Vitals  Enc Vitals Group     BP 12/20/17 1724 (!) 155/65     Pulse Rate 12/20/17 1724 89     Resp 12/20/17 1724 16     Temp 12/20/17 1724 97.7 F (36.5 C)     Temp Source 12/20/17 1724 Oral     SpO2 12/20/17 1724 97 %     Weight 12/20/17 1725 57.2 kg (126 lb)     Height 12/20/17 1725 1.575 m (5\' 2" )     Head Circumference --      Peak Flow --      Pain Score 12/20/17 1725 0     Pain Loc --      Pain Edu? --      Excl. in GC? --     Constitutional: Alert and oriented.  No acute distress. Eyes: Conjunctivae are normal.  Head: Atraumatic. Nose: No congestion/rhinnorhea. Mouth/Throat: Mucous membranes are moist. Neck: No stridor.  No meningeal signs.   Cardiovascular: Normal rate, regular rhythm. Good peripheral circulation. Grossly normal heart sounds. Respiratory: Normal respiratory effort.  No retractions. Lungs CTAB. Gastrointestinal: Soft and nontender. No distention.  Musculoskeletal: No lower extremity tenderness nor edema. No gross deformities of extremities. Neurologic:  Normal speech and language. No gross focal neurologic deficits are appreciated.  Skin:  Skin is warm, dry and intact. No rash noted. Psychiatric: Mood and affect are normal. Speech and behavior are normal.  ____________________________________________   LABS (all labs ordered are listed, but only abnormal results are  displayed)  Labs Reviewed  BASIC METABOLIC PANEL - Abnormal; Notable for the following components:      Result Value   Glucose, Bld 328 (*)    All other components within normal limits  URINALYSIS, COMPLETE (UACMP) WITH MICROSCOPIC - Abnormal; Notable for the following components:   Color, Urine YELLOW (*)    APPearance CLEAR (*)    Glucose, UA >=500 (*)    Leukocytes, UA MODERATE (*)    All other components within normal limits  GLUCOSE, CAPILLARY - Abnormal; Notable for the following components:   Glucose-Capillary 303 (*)    All other components within normal limits  CBC  CBG MONITORING, ED   ____________________________________________  EKG  None - EKG not ordered by ED physician ____________________________________________  RADIOLOGY   ED MD interpretation: No indication for imaging  Official radiology report(s): No results found.  ____________________________________________   PROCEDURES  Critical Care performed: No   Procedure(s) performed:   Procedures   ____________________________________________   INITIAL IMPRESSION / ASSESSMENT AND PLAN / ED COURSE  As part of my medical decision making, I reviewed the following data within the electronic MEDICAL RECORD NUMBER Nursing notes reviewed and incorporated, Labs reviewed , Old chart reviewed and Notes from prior ED visits    The patient is a frequent visitor to the most common emergency department system although usually for psychiatric reasons.  She is pleasant and communicative today and expressing no suicidality.  There is no indication for involuntary commitment or psychiatric evaluation.  She was noncompliant with her metformin which is led to some hyperglycemia but her lab work is otherwise unremarkable and there is no evidence of DKA.  I provided 1 L of normal saline by IV for a blood glucose of 328.  I also provided a dose of metformin because she states that she does not have it at home.  I am  providing a prescription and I stressed her the importance of taking her medication.  She states that she understands.  She will follow-up as an outpatient with her primary care provider.    I gave my usual and customary return precautions.    ____________________________________________  FINAL CLINICAL IMPRESSION(S) / ED DIAGNOSES  Final diagnoses:  Hyperglycemia  Noncompliance with medications     MEDICATIONS GIVEN DURING THIS VISIT:  Medications  sodium chloride 0.9 % bolus 1,000 mL (0 mLs Intravenous Stopped 12/20/17 2132)  metFORMIN (GLUCOPHAGE) tablet 500 mg (500 mg Oral Given 12/20/17 2036)     ED Discharge Orders        Ordered    metFORMIN (GLUCOPHAGE) 500 MG tablet  2 times daily with meals     12/20/17 2102       Note:  This document was prepared using Dragon voice recognition software and may include unintentional dictation errors.    Loleta Rose, MD 12/20/17 2102    Loleta Rose, MD 12/21/17 [phone]

## 2017-12-20 NOTE — Discharge Instructions (Signed)

## 2017-12-20 NOTE — Progress Notes (Signed)
Name: Erica Mueller   MRN: [id number]    DOB: [birth year]   Date:12/20/2017       Progress Note  Subjective  Chief Complaint  Chief Complaint  Patient presents with  . Establish Care  . Schizophrenia    Needs Psych referral got meds in May at hospital in [facility] but is out of meds.   . Weight Loss    Patient present to establish primary care only for diabetes,hypertension and hyperlipidemia.   Hypertension  This is a chronic problem. Condition status: uncertain. Pertinent negatives include no blurred vision, chest pain, malaise/fatigue, neck pain, palpitations or shortness of breath. Risk factors for coronary artery disease include diabetes mellitus and dyslipidemia. Past treatments include calcium channel blockers. Improvement on treatment: uncertain. There are no compliance problems.  There is no history of angina, kidney disease, CAD/MI, CVA, heart failure, left ventricular hypertrophy, PVD or retinopathy. There is no history of chronic renal disease, a hypertension causing med or renovascular disease.  Diabetes  She presents for her follow-up diabetic visit. She has type 2 diabetes mellitus. Disease course: uncertain. There are no hypoglycemic associated symptoms. Associated symptoms include polydipsia, polyphagia and polyuria. Pertinent negatives for diabetes include no blurred vision, no chest pain, no fatigue, no foot paresthesias, no foot ulcerations, no visual change, no weakness and no weight loss. There are no hypoglycemic complications. Diabetic symptom progression: ucertain. Pertinent negatives for diabetic complications include no autonomic neuropathy, CVA, heart disease, impotence, nephropathy, peripheral neuropathy, PVD or retinopathy. She is compliant with treatment some of the time. She is following a generally unhealthy diet. Home blood sugar record trend: uncertain. She does not see a podiatrist.Eye exam is current.  Hyperlipidemia  This is a chronic problem. Condition status:  uncertain. Lipid results: uncertain. She has no history of chronic renal disease. Pertinent negatives include no chest pain, focal weakness, myalgias or shortness of breath. Current antihyperlipidemic treatment includes statins. The current treatment provides moderate improvement of lipids. Compliance problems: does not keep appt/do not take medications.  Risk factors for coronary artery disease include dyslipidemia, diabetes mellitus and hypertension ("smoke like a chimney").    Hyperlipemia Uncertain control refill lovastatin.  Diabetes (HCC) Noncontrol Noncompliant Uncertain baseline Fingerstick 446 mg % refill pioglitazone 15mg / metformen 500mg  bid/ to er for eval  Noncompliance noncompliant medications/noncompliant appointments/ noncompliant diet/alcohol/smoking   Past Medical History:  Diagnosis Date  . Coronary artery disease   . Depression   . Diabetes mellitus without complication (HCC)   . Elevated lipids   . GERD (gastroesophageal reflux disease)   . Hypertension   . Syphilis (acquired)     Past Surgical History:  Procedure Laterality Date  . COLONOSCOPY    . COLONOSCOPY WITH PROPOFOL N/A 09/01/2015   Procedure: COLONOSCOPY WITH PROPOFOL;  Surgeon: Wallace Cullens, MD;  Location: [facility] ENDOSCOPY;  Service: Gastroenterology;  Laterality: N/A;  . TUBAL LIGATION    . wisdom teeth removal      Family History  Problem Relation Age of Onset  . Cancer Mother   . Alcoholism Mother   . Bone cancer Father   . Heart disease Father   . Diabetes Maternal Grandmother   . Suicidality Paternal Uncle     Social History   Socioeconomic History  . Marital status: Divorced    Spouse name: Not on file  . Number of children: Not on file  . Years of education: Not on file  . Highest education level: Not on file  Occupational History  . Not  on file  Social Needs  . Financial resource strain: Not on file  . Food insecurity:    Worry: Not on file    Inability: Not on file  .  Transportation needs:    Medical: Not on file    Non-medical: Not on file  Tobacco Use  . Smoking status: Current Every Day Smoker    Packs/day: 1.00    Years: 48.00    Pack years: 48.00    Types: Cigarettes    Start date: 12/20/1969  . Smokeless tobacco: Never Used  Substance and Sexual Activity  . Alcohol use: Yes    Alcohol/week: 1.8 oz    Types: 2 Cans of beer, 1 Shots of liquor per week  . Drug use: Not Currently    Types: Marijuana    Comment: PAST   . Sexual activity: Not Currently    Birth control/protection: None  Lifestyle  . Physical activity:    Days per week: 3 days    Minutes per session: 30 min  . Stress: Only a little  Relationships  . Social connections:    Talks on phone: Not on file    Gets together: Not on file    Attends religious service: Not on file    Active member of club or organization: Not on file    Attends meetings of clubs or organizations: Not on file    Relationship status: Not on file  . Intimate partner violence:    Fear of current or ex partner: Not on file    Emotionally abused: Not on file    Physically abused: Not on file    Forced sexual activity: Not on file  Other Topics Concern  . Not on file  Social History Narrative  . Not on file    No Known Allergies  Outpatient Medications Prior to Visit  Medication Sig Dispense Refill  . amitriptyline (ELAVIL) 25 MG tablet Take 1 tablet (25 mg total) by mouth at bedtime. 14 tablet 0  . thiothixene (NAVANE) 2 MG capsule Take 1 capsule (2 mg total) by mouth 2 (two) times daily. 28 capsule 0  . amLODipine (NORVASC) 5 MG tablet Take 1 tablet (5 mg total) by mouth daily. 14 tablet 0  . lovastatin (MEVACOR) 40 MG tablet Take 40 mg by mouth at bedtime.    . metFORMIN (GLUCOPHAGE) 500 MG tablet Take by mouth 2 (two) times daily with a meal.    . pioglitazone (ACTOS) 15 MG tablet Take 1 tablet (15 mg total) by mouth daily. 14 tablet 0  . paliperidone (INVEGA SUSTENNA) 234 MG/1.5ML SUSY  injection Inject 234 mg into the muscle once for 1 dose. Next injection due 11/20/17 1.5 mL 0   No facility-administered medications prior to visit.     Review of Systems  Constitutional: Negative for chills, fatigue, fever, malaise/fatigue and weight loss.  HENT: Negative for ear discharge, ear pain and sore throat.   Eyes: Negative for blurred vision.  Respiratory: Negative for cough, sputum production, shortness of breath and wheezing.   Cardiovascular: Negative for chest pain, palpitations and leg swelling.  Gastrointestinal: Negative for abdominal pain, blood in stool, constipation, diarrhea, heartburn, melena and nausea.  Genitourinary: Negative for dysuria, frequency, hematuria, impotence and urgency.  Musculoskeletal: Negative for back pain, joint pain, myalgias and neck pain.  Skin: Negative for rash.  Neurological: Negative for tingling, sensory change, focal weakness and weakness.  Endo/Heme/Allergies: Positive for polydipsia and polyphagia. Negative for environmental allergies. Does not bruise/bleed easily.  Psychiatric/Behavioral: Negative for depression, hallucinations and suicidal ideas. The patient does not have insomnia.        Problem? Not that much     Objective  Vitals:   12/20/17 1503  BP: 120/78  Pulse: 78  Resp: 16  SpO2: 98%  Weight: 127 lb (57.6 kg)  Height: 5\' 2"  (1.575 m)    Physical Exam  Constitutional: She appears well-developed and well-nourished.  HENT:  Head: Normocephalic.  Right Ear: External ear normal.  Left Ear: External ear normal.  Mouth/Throat: Oropharynx is clear and moist.  Eyes: Pupils are equal, round, and reactive to light. Conjunctivae and EOM are normal. Lids are everted and swept, no foreign bodies found. Left eye exhibits no hordeolum. No foreign body present in the left eye. Right conjunctiva is not injected. Left conjunctiva is not injected. No scleral icterus.  Neck: Normal range of motion. Neck supple. No JVD present. No  tracheal deviation present. No thyromegaly present.  Cardiovascular: Normal rate, regular rhythm, normal heart sounds and intact distal pulses. Exam reveals no gallop and no friction rub.  No murmur heard. Pulmonary/Chest: Effort normal and breath sounds normal. No respiratory distress. She has no wheezes. She has no rales.  Abdominal: Soft. Bowel sounds are normal. She exhibits no mass. There is no hepatosplenomegaly. There is no tenderness. There is no rebound and no guarding.  Musculoskeletal: Normal range of motion. She exhibits no edema or tenderness.  Lymphadenopathy:    She has no cervical adenopathy.  Neurological: She is alert. She has normal strength. She displays normal reflexes. No cranial nerve deficit.  Skin: Skin is warm. No rash noted.  Psychiatric: She has a normal mood and affect. Her mood appears not anxious. She does not exhibit a depressed mood.  Nursing note and vitals reviewed.     Assessment & Plan  Problem List Items Addressed This Visit      Cardiovascular and Mediastinum   Hypertension   Relevant Medications   lovastatin (MEVACOR) 40 MG tablet   amLODipine (NORVASC) 5 MG tablet     Endocrine   Diabetes (HCC)    Noncontrol Noncompliant Uncertain baseline Fingerstick 446 mg % refill pioglitazone 15mg / metformen 500mg  bid/ to er for eval      Relevant Medications   pioglitazone (ACTOS) 15 MG tablet   metFORMIN (GLUCOPHAGE) 500 MG tablet   lovastatin (MEVACOR) 40 MG tablet   Other Relevant Orders   POCT Glucose (CBG) (Completed)     Other   Noncompliance    noncompliant medications/noncompliant appointments/ noncompliant diet/alcohol/smoking      Hyperlipemia    Uncertain control refill lovastatin.      Relevant Medications   lovastatin (MEVACOR) 40 MG tablet   amLODipine (NORVASC) 5 MG tablet    Other Visit Diagnoses    Establishing care with new doctor, encounter for    -  Primary   Behavior problem, adult       Patient on multiple  behavior medications that are out. Did not go to any psych appt. Given suggestion to go to psychiatry.      Meds ordered this encounter  Medications  . pioglitazone (ACTOS) 15 MG tablet    Sig: Take 1 tablet (15 mg total) by mouth daily.    Dispense:  30 tablet    Refill:  2  . metFORMIN (GLUCOPHAGE) 500 MG tablet    Sig: Take 1 tablet (500 mg total) by mouth 2 (two) times daily with a meal.    Dispense:  60 tablet    Refill:  2  . lovastatin (MEVACOR) 40 MG tablet    Sig: Take 1 tablet (40 mg total) by mouth at bedtime.    Dispense:  60 tablet    Refill:  2  . amLODipine (NORVASC) 5 MG tablet    Sig: Take 1 tablet (5 mg total) by mouth daily.    Dispense:  30 tablet    Refill:  2      Dr. Hayden [facility]  12/20/17

## 2017-12-20 NOTE — ED Notes (Signed)
Pt given diet cola and saltines at this time per Nursing Communication order by Dr York Cerise.

## 2017-12-20 NOTE — ED Triage Notes (Signed)
First Nurse Note:  Arrives C/O elevated blood sugar.  Patient states CBG was 450.  Patient is AAOx3.  Skin warm and dry. NAD

## 2017-12-20 NOTE — Assessment & Plan Note (Signed)
noncompliant medications/noncompliant appointments/ noncompliant diet/alcohol/smoking

## 2017-12-20 NOTE — ED Triage Notes (Addendum)
Pt arrives to our ED with complaints of hyperglycemia. Pt states CBG was over 450. CBG upon triage is 303.   Pt asking [place] to send her home with a blood sugar monitor.  Pt also requesting food and a blanket. Blanket provided to patient. This RN explained to pt she is not to eat food until she sees an MD.

## 2017-12-20 NOTE — Assessment & Plan Note (Signed)
Uncertain control refill lovastatin.

## 2017-12-23 ENCOUNTER — Other Ambulatory Visit: Payer: Self-pay

## 2017-12-23 DIAGNOSIS — E118 Type 2 diabetes mellitus with unspecified complications: Secondary | ICD-10-CM

## 2017-12-23 NOTE — Addendum Note (Signed)
Addended by: Everitt Amber on: 12/23/2017 03:05 PM   Modules accepted: Orders

## 2017-12-24 ENCOUNTER — Telehealth: Payer: Self-pay

## 2017-12-24 NOTE — Telephone Encounter (Signed)
Called to get script for diab supplies. Left script up front for [employer] to pick up

## 2017-12-30 ENCOUNTER — Other Ambulatory Visit: Payer: Self-pay | Admitting: Family Medicine

## 2017-12-30 DIAGNOSIS — Z1231 Encounter for screening mammogram for malignant neoplasm of breast: Secondary | ICD-10-CM

## 2018-01-16 ENCOUNTER — Inpatient Hospital Stay: Admission: RE | Admit: 2018-01-16 | Payer: Self-pay | Source: Ambulatory Visit

## 2018-01-20 ENCOUNTER — Ambulatory Visit: Payer: Self-pay

## 2018-01-21 ENCOUNTER — Ambulatory Visit: Payer: Self-pay

## 2018-01-22 ENCOUNTER — Ambulatory Visit: Payer: Self-pay

## 2018-01-23 ENCOUNTER — Inpatient Hospital Stay: Admission: RE | Admit: 2018-01-23 | Payer: Self-pay | Source: Ambulatory Visit

## 2018-01-31 ENCOUNTER — Telehealth: Payer: Self-pay

## 2018-01-31 NOTE — Telephone Encounter (Signed)
Erica Mueller called asking for a fax to be sent to [facility] for test strips and lancets. Sent fax over.

## 2018-02-04 ENCOUNTER — Ambulatory Visit
Admission: RE | Admit: 2018-02-04 | Discharge: 2018-02-04 | Disposition: A | Discharge status: Home / Self Care | Payer: Medicare Other | Source: Ambulatory Visit | Attending: Family Medicine | Admitting: Family Medicine

## 2018-02-04 DIAGNOSIS — Z1231 Encounter for screening mammogram for malignant neoplasm of breast: Secondary | ICD-10-CM | POA: Insufficient documentation

## 2018-02-05 ENCOUNTER — Other Ambulatory Visit: Payer: Self-pay | Admitting: Obstetrics and Gynecology

## 2018-02-05 ENCOUNTER — Other Ambulatory Visit: Payer: Self-pay | Admitting: Family Medicine

## 2018-02-05 DIAGNOSIS — N6489 Other specified disorders of breast: Secondary | ICD-10-CM

## 2018-02-05 DIAGNOSIS — R928 Other abnormal and inconclusive findings on diagnostic imaging of breast: Secondary | ICD-10-CM

## 2018-02-12 ENCOUNTER — Telehealth: Payer: Self-pay

## 2018-02-12 NOTE — Telephone Encounter (Signed)
Returned pt's call concerning "appointment". The patient's voicemail picked up, but I was unable to leave a message due to "mailbox is full." Pt. Was suppose to go to psychiatry and endocrinology. I will await her phone call to see if and what we can help her with for future appointments.

## 2018-02-17 ENCOUNTER — Other Ambulatory Visit: Payer: Self-pay

## 2018-02-17 ENCOUNTER — Ambulatory Visit: Payer: Medicare Other

## 2018-02-18 ENCOUNTER — Ambulatory Visit
Admission: RE | Admit: 2018-02-18 | Discharge: 2018-02-18 | Disposition: A | Discharge status: Home / Self Care | Payer: Medicare Other | Source: Ambulatory Visit | Attending: Family Medicine | Admitting: Family Medicine

## 2018-02-18 DIAGNOSIS — N6489 Other specified disorders of breast: Secondary | ICD-10-CM | POA: Insufficient documentation

## 2018-02-18 DIAGNOSIS — R928 Other abnormal and inconclusive findings on diagnostic imaging of breast: Secondary | ICD-10-CM | POA: Diagnosis not present

## 2018-02-19 ENCOUNTER — Telehealth: Payer: Self-pay

## 2018-02-19 NOTE — Telephone Encounter (Signed)
Patient calling wanting to know what she takes for HTN. I explained she had great BP at [facility] and went over meds. She said no one told her to get Amlodipine and she never knew that she was supposed to take this RX. I advised that CMA will call her back soon to discuss [phone].

## 2018-02-19 NOTE — Telephone Encounter (Signed)
Pt returned call from 02/12/18- she is being followed by endocrinology and psychiatry. Doesn't need an appt at this time for any acute issues

## 2019-03-24 ENCOUNTER — Other Ambulatory Visit: Payer: Self-pay | Admitting: Family Medicine

## 2019-03-24 ENCOUNTER — Other Ambulatory Visit: Payer: Self-pay

## 2019-03-24 ENCOUNTER — Encounter: Payer: Self-pay | Admitting: Emergency Medicine

## 2019-03-24 ENCOUNTER — Ambulatory Visit
Admission: EM | Admit: 2019-03-24 | Discharge: 2019-03-24 | Disposition: A | Discharge status: Home / Self Care | Payer: Medicare Other | Attending: Family Medicine | Admitting: Family Medicine

## 2019-03-24 ENCOUNTER — Telehealth: Payer: Self-pay

## 2019-03-24 DIAGNOSIS — R079 Chest pain, unspecified: Secondary | ICD-10-CM | POA: Diagnosis not present

## 2019-03-24 DIAGNOSIS — Z1231 Encounter for screening mammogram for malignant neoplasm of breast: Secondary | ICD-10-CM

## 2019-03-24 DIAGNOSIS — R05 Cough: Secondary | ICD-10-CM | POA: Diagnosis not present

## 2019-03-24 HISTORY — DX: Schizophrenia, unspecified: F20.9

## 2019-03-24 NOTE — ED Triage Notes (Signed)
Patient c/o intermittent chest pain x 1 week. Patient was coughing during triage and she states this is a "smokers" cough that she has had for awhile. Denies fever.

## 2019-03-24 NOTE — Discharge Instructions (Signed)
Rest.  Tylenol as needed.  Will send message to Dr. Ronnald Ramp.  Take care  Dr. Lacinda Axon

## 2019-03-24 NOTE — Telephone Encounter (Signed)
Pt called atating, "I've been having pains in my chest." I explained to her that if she is having any pain, pressure, tightness, etc in her chest, she needs to go to the hospital. She asked, "the emergency room?" I stated "yes, it is important this get checked out through them." Pt said, "Okay."

## 2019-03-24 NOTE — ED Provider Notes (Signed)
[facility]    CSN: [id number] Arrival date & time: 03/24/19  1337      History   Chief Complaint Chief Complaint  Patient presents with  . Chest Pain   HPI  68 year old female with schizophrenia, diabetes, hypertension, hyperlipidemia presents with chest pain.  Patient reports intermittent chest pain for the past few weeks.  She states that is located on the left side underneath her left breast.  Intermittent.  Rates her pain as 5/10 in severity currently.  No shortness of breath.  No reports of diaphoresis.  She does smoke and continues to do so.  She has had some cough but attributes this to a smoker's cough.  No fever.  No known exacerbating or relieving factors.  No other reported symptoms.  No other complaints or concerns at this time.   PMH, Surgical Hx, Family Hx, Social History reviewed and updated as below.  Past Medical History:  Diagnosis Date  . Depression   . Diabetes mellitus without complication ([place])   . Elevated lipids   . GERD (gastroesophageal reflux disease)   . Hypertension   . Schizophrenia ([place])   . Syphilis (acquired)     Patient Active Problem List   Diagnosis Date Noted  . GERD (gastroesophageal reflux disease) 12/20/2017  . Hyperlipemia 12/20/2017  . History of schizophrenia 07/30/2017  . Schizophrenia ([place]) 05/27/2017  . Diabetes ([place]) 04/02/2017  . Hypertension 04/02/2017  . Noncompliance 04/02/2017  . Depression 11/17/2013    Past Surgical History:  Procedure Laterality Date  . COLONOSCOPY    . COLONOSCOPY WITH PROPOFOL N/A 09/01/2015   Procedure: COLONOSCOPY WITH PROPOFOL;  Surgeon: Hulen Luster, MD;  Location: [facility] ENDOSCOPY;  Service: Gastroenterology;  Laterality: N/A;  . TUBAL LIGATION    . wisdom teeth removal      OB History   No obstetric history on file.      Home Medications    Prior to Admission medications   Medication Sig Start Date End Date Taking? Authorizing Provider  amitriptyline (ELAVIL) 25 MG  tablet Take 1 tablet (25 mg total) by mouth at bedtime. 10/24/17  Yes McNew, Tyson Babinski, MD  amLODipine (NORVASC) 5 MG tablet Take 1 tablet (5 mg total) by mouth daily. 12/20/17  Yes Juline Patch, MD  lovastatin (MEVACOR) 40 MG tablet Take 1 tablet (40 mg total) by mouth at bedtime. 12/20/17  Yes Juline Patch, MD  metFORMIN (GLUCOPHAGE) 500 MG tablet Take 1 tablet (500 mg total) by mouth 2 (two) times daily with a meal. 12/20/17  Yes Hinda Kehr, MD  pioglitazone (ACTOS) 15 MG tablet Take 1 tablet (15 mg total) by mouth daily. 12/20/17  Yes Juline Patch, MD  thiothixene (NAVANE) 2 MG capsule Take 1 capsule (2 mg total) by mouth 2 (two) times daily. 10/24/17  Yes McNew, Tyson Babinski, MD  paliperidone (INVEGA SUSTENNA) 234 MG/1.5ML SUSY injection Inject 234 mg into the muscle once for 1 dose. Next injection due 11/20/17 11/20/17 11/20/17  McNew, Tyson Babinski, MD    Family History Family History  Problem Relation Age of Onset  . Cancer Mother   . Alcoholism Mother   . Bone cancer Father   . Heart disease Father   . Diabetes Maternal Grandmother   . Suicidality Paternal Uncle   . Breast cancer Neg Hx     Social History Social History   Tobacco Use  . Smoking status: Current Every Day Smoker    Packs/day: 1.00    Years:  48.00    Pack years: 48.00    Types: Cigarettes    Start date: 12/20/1969  . Smokeless tobacco: Never Used  Substance Use Topics  . Alcohol use: Yes    Alcohol/week: 3.0 standard drinks    Types: 2 Cans of beer, 1 Shots of liquor per week  . Drug use: Not Currently    Types: Marijuana    Comment: PAST      Allergies   Patient has no known allergies.   Review of Systems Review of Systems  Respiratory: Positive for cough. Negative for shortness of breath.   Cardiovascular: Positive for chest pain.   Physical Exam Triage Vital Signs ED Triage Vitals  Enc Vitals Group     BP 03/24/19 1358 (!) 162/67     Pulse Rate 03/24/19 1358 92     Resp 03/24/19 1358 18      Temp 03/24/19 1358 98.6 F (37 C)     Temp Source 03/24/19 1358 Oral     SpO2 03/24/19 1358 98 %     Weight 03/24/19 1359 130 lb (59 kg)     Height 03/24/19 1359 5\' 2"  (1.575 m)     Head Circumference --      Peak Flow --      Pain Score 03/24/19 1358 5     Pain Loc --      Pain Edu? --      Excl. in GC? --    Updated Vital Signs BP (!) 162/67 (BP Location: Right Arm)   Pulse 92   Temp 98.6 F (37 C) (Oral)   Resp 18   Ht 5\' 2"  (1.575 m)   Wt 59 kg   SpO2 98%   BMI 23.78 kg/m   Visual Acuity Right Eye Distance:   Left Eye Distance:   Bilateral Distance:    Right Eye Near:   Left Eye Near:    Bilateral Near:     Physical Exam Vitals signs and nursing note reviewed.  Constitutional:      General: She is not in acute distress.    Appearance: Normal appearance. She is not ill-appearing.  HENT:     Head: Normocephalic and atraumatic.  Eyes:     General:        Right eye: No discharge.        Left eye: No discharge.     Conjunctiva/sclera: Conjunctivae normal.  Cardiovascular:     Rate and Rhythm: Normal rate and regular rhythm.     Heart sounds: Murmur present.  Pulmonary:     Effort: Pulmonary effort is normal.     Breath sounds: No wheezing or rales.  Chest:     Chest wall: No tenderness.  Abdominal:     General: There is no distension.     Palpations: Abdomen is soft.     Tenderness: There is no abdominal tenderness.  Skin:    General: Skin is warm.     Findings: No rash.  Neurological:     Mental Status: She is alert.  Psychiatric:        Behavior: Behavior normal.     Comments: Flat affect.    UC Treatments / Results  Labs (all labs ordered are listed, but only abnormal results are displayed) Labs Reviewed - No data to display  EKG Interpretation: Normal sinus rhythm with a rate of 86.  Prolonged QT.  LVH.  No ST or T wave changes.  Probable LAE.  Radiology No results found.  Procedures Procedures (including critical care time)   Medications Ordered in UC Medications - No data to display  Initial Impression / Assessment and Plan / UC Course  I have reviewed the triage vital signs and the nursing notes.  Pertinent labs & imaging results that were available during my care of the patient were reviewed by me and considered in my medical decision making (see chart for details).    68 year old female presents with chest pain.  EKG findings above.  No evidence of acute ST or T wave changes.  Advised Tylenol as needed.  Will send message to primary care physician for referral to cardiology given risk factors.  Supportive care.  Final Clinical Impressions(s) / UC Diagnoses   Final diagnoses:  Chest pain, unspecified type     Discharge Instructions     Rest.  Tylenol as needed.  Will send message to Dr. Yetta Barre.  Take care  Dr. Adriana Simas    ED Prescriptions    None     PDMP not reviewed this encounter.   Tommie Sams, [place] 03/24/19 Paulo Fruit

## 2019-03-25 ENCOUNTER — Telehealth: Payer: Self-pay

## 2019-03-25 NOTE — Telephone Encounter (Signed)
Received a note from [facility] concerning pt needing a referral to cardio- called pt to get her an appt, but her "mailbox is full and cannot receive any calls at this time." Will try again tomorrow and will send message back to [place].

## 2019-04-23 ENCOUNTER — Inpatient Hospital Stay: Admission: RE | Admit: 2019-04-23 | Payer: Medicare Other | Source: Ambulatory Visit

## 2019-05-13 IMAGING — MG MM DIGITAL SCREENING BILAT W/ TOMO W/ CAD
9 series · 9 of 25 positions shown · non-contrast
Comparison: Previous exam(s).

CLINICAL DATA: Screening.

EXAM:
DIGITAL SCREENING BILATERAL MAMMOGRAM WITH TOMO AND CAD

[L MLO synth-2D]
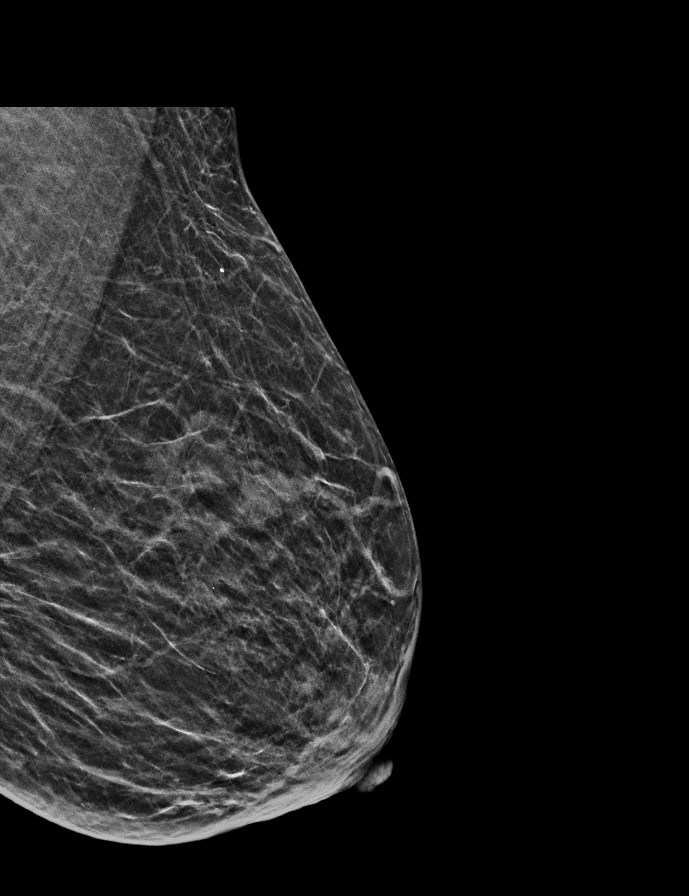

[R MLO synth-2D]
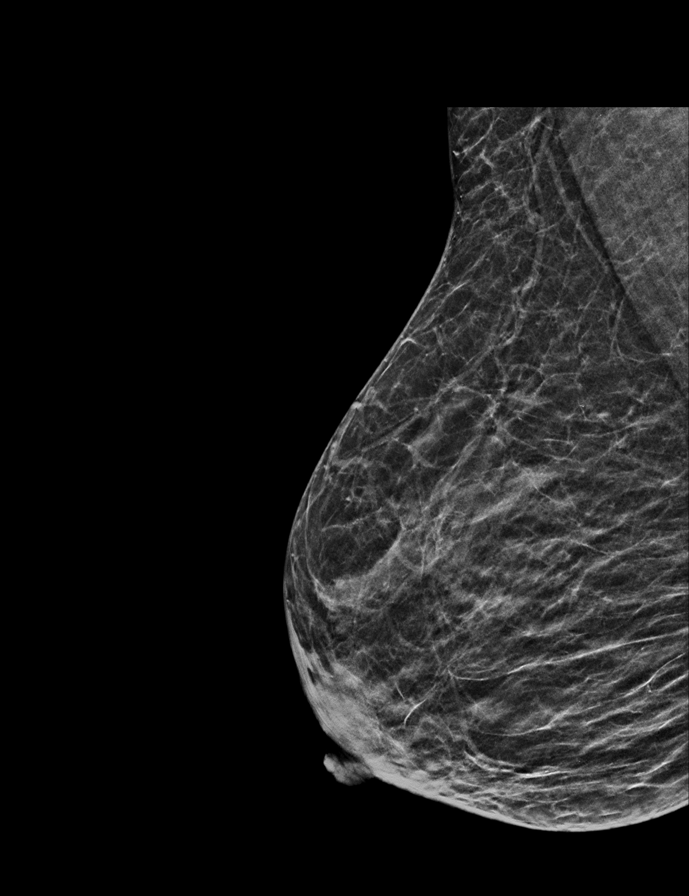

[L CC synth-2D]
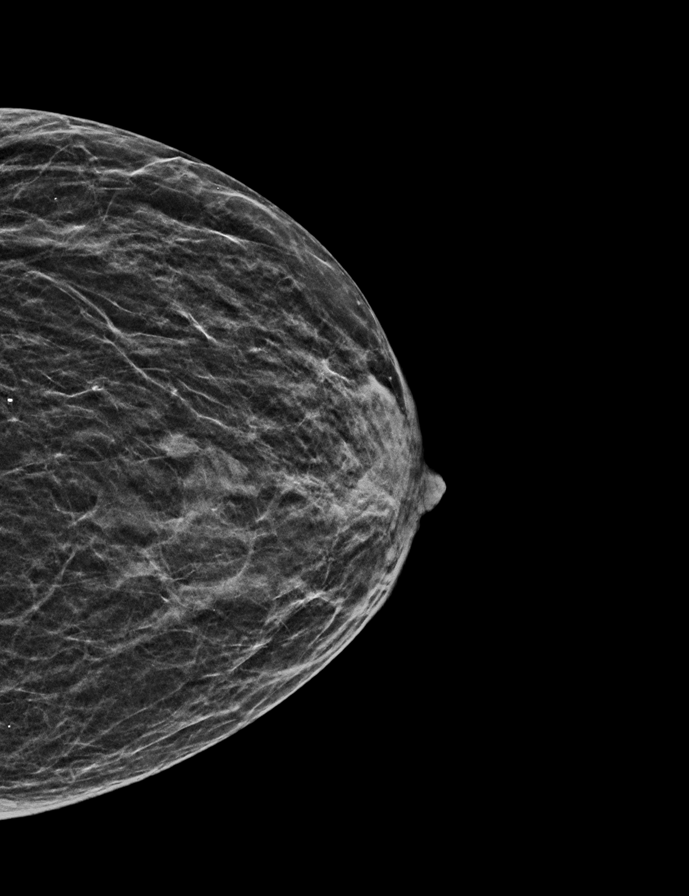

[R CC synth-2D]
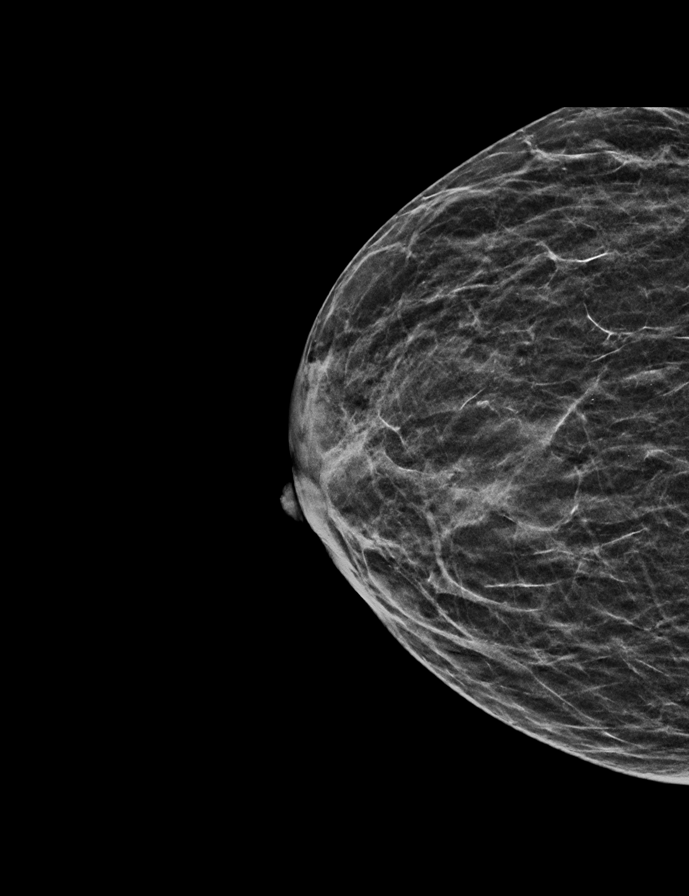

[L MLO tomo · tomo slice 17/33.0]
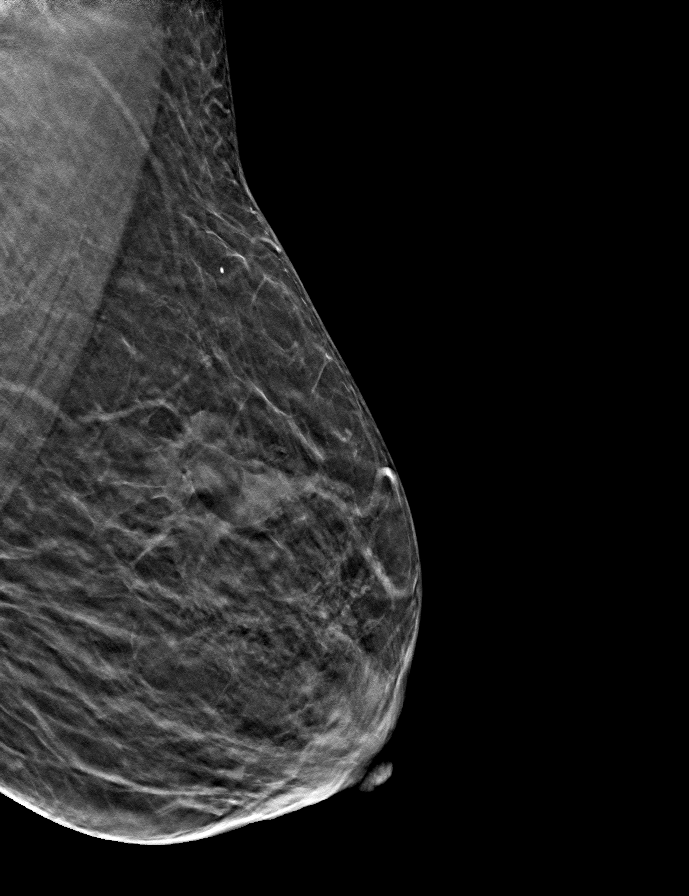

[R MLO tomo · tomo slice 17/33.0]
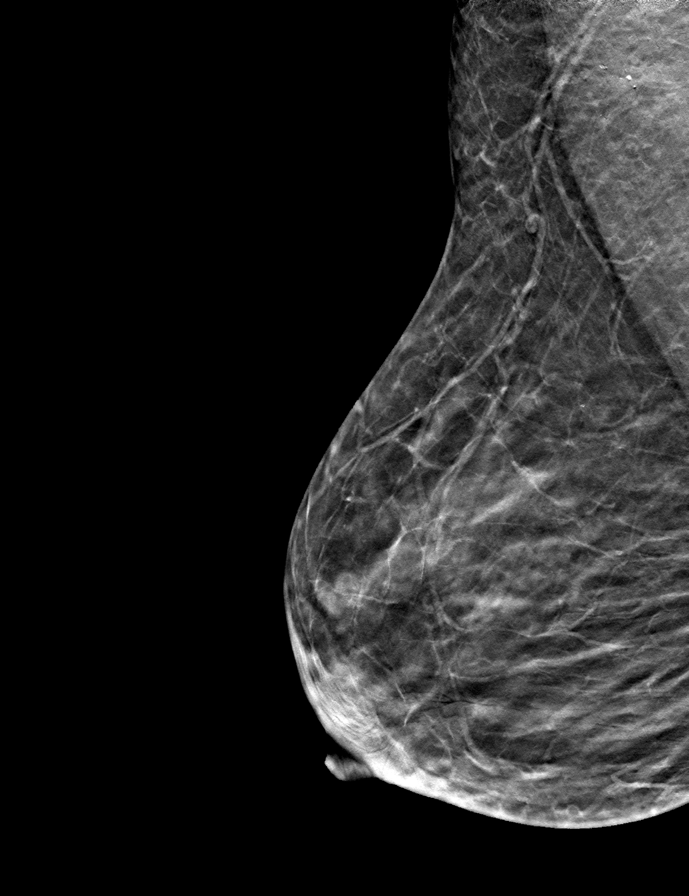

[L CC tomo · tomo slice 15/29.0]
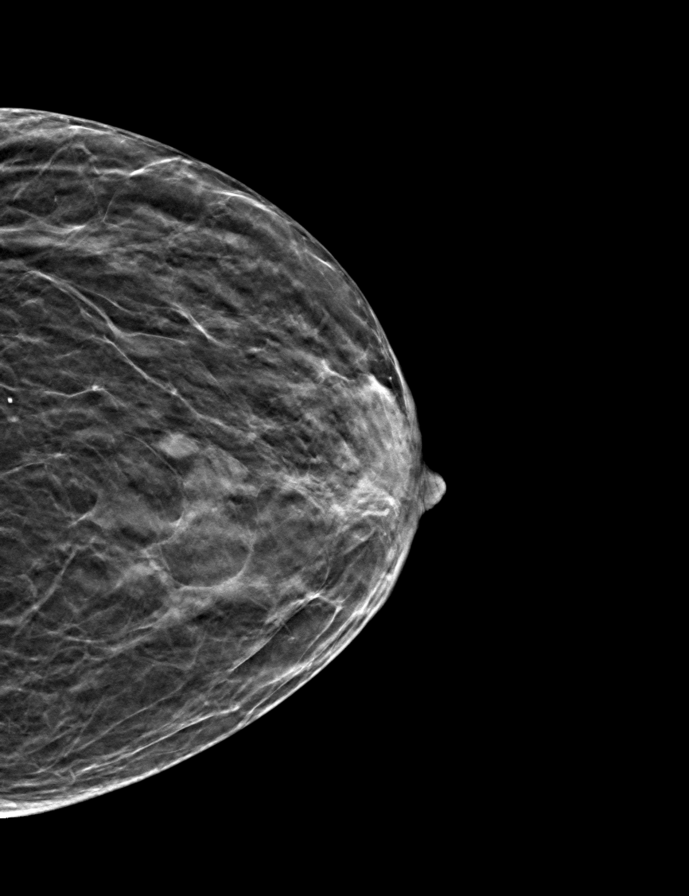

[R CC tomo · tomo slice 16/31.0]
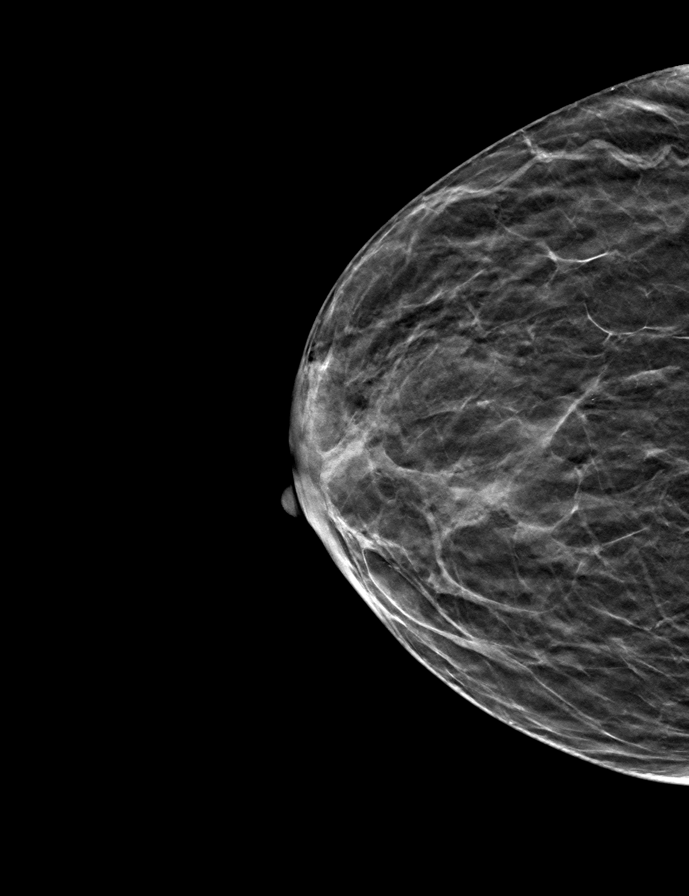

[L CC]
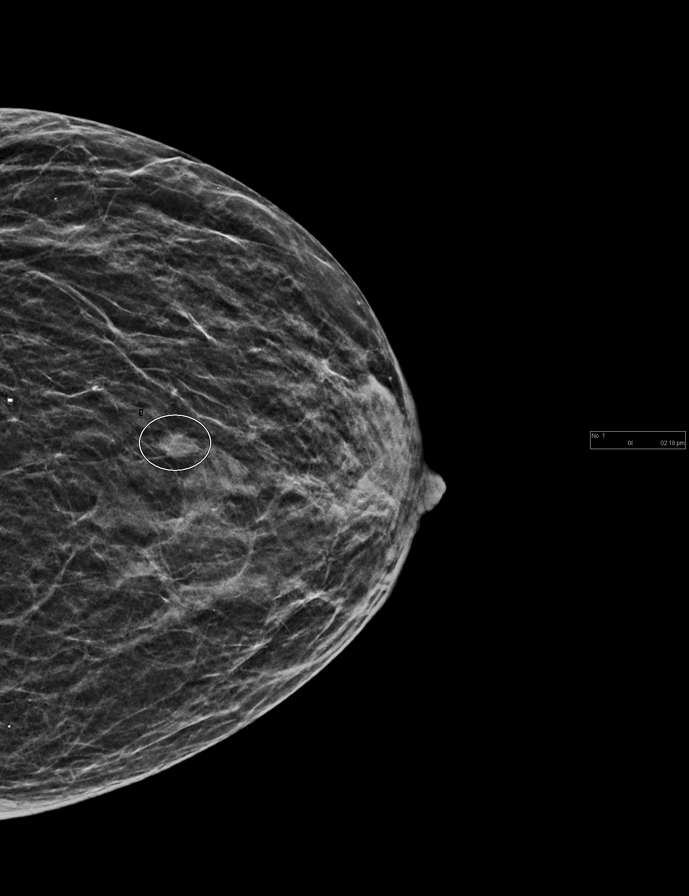

[9 of 25 positions shown; findings below may reference images not displayed]

ACR Breast Density Category b: There are scattered areas of
fibroglandular density.
FINDINGS: In the left breast, a possible asymmetry warrants further
evaluation. In the right breast, no findings suspicious for
malignancy. Images were processed with CAD.
IMPRESSION: Further evaluation is suggested for possible asymmetry in the left
breast.

RECOMMENDATION:
Diagnostic mammogram and possibly ultrasound of the left breast.
(Code:DI-5-LL4)

The patient will be contacted regarding the findings, and additional
imaging will be scheduled.

BI-RADS CATEGORY  0: Incomplete. Need additional imaging evaluation
and/or prior mammograms for comparison.

## 2019-07-03 DIAGNOSIS — E1165 Type 2 diabetes mellitus with hyperglycemia: Secondary | ICD-10-CM | POA: Diagnosis not present

## 2019-07-03 DIAGNOSIS — E1169 Type 2 diabetes mellitus with other specified complication: Secondary | ICD-10-CM | POA: Diagnosis not present

## 2019-07-03 DIAGNOSIS — E1159 Type 2 diabetes mellitus with other circulatory complications: Secondary | ICD-10-CM | POA: Diagnosis not present

## 2019-07-03 DIAGNOSIS — E785 Hyperlipidemia, unspecified: Secondary | ICD-10-CM | POA: Diagnosis not present

## 2019-07-07 ENCOUNTER — Telehealth: Payer: Self-pay

## 2019-07-07 NOTE — Telephone Encounter (Signed)
[facility] called wanting the prescription for Amlodipine for pill packaging for pt. I returned the call to inform them that the pt established with [place] in July of 2019 and we have not seen her since then. I also gave the tech [facility] and Dr Maisie Fus [facility] name, as it appears they have been filling all her meds since Sept 2019. Call back for pharm- [phone]

## 2020-04-29 DIAGNOSIS — E785 Hyperlipidemia, unspecified: Secondary | ICD-10-CM | POA: Diagnosis not present

## 2020-04-29 DIAGNOSIS — F17200 Nicotine dependence, unspecified, uncomplicated: Secondary | ICD-10-CM | POA: Diagnosis not present

## 2020-04-29 DIAGNOSIS — E1169 Type 2 diabetes mellitus with other specified complication: Secondary | ICD-10-CM | POA: Diagnosis not present

## 2020-04-29 DIAGNOSIS — E1165 Type 2 diabetes mellitus with hyperglycemia: Secondary | ICD-10-CM | POA: Diagnosis not present

## 2020-04-29 DIAGNOSIS — E1159 Type 2 diabetes mellitus with other circulatory complications: Secondary | ICD-10-CM | POA: Diagnosis not present

## 2020-10-12 ENCOUNTER — Ambulatory Visit: Payer: Medicare Other

## 2020-11-07 ENCOUNTER — Inpatient Hospital Stay
Admission: EM | Admit: 2020-11-07 | Discharge: 2020-11-11 | DRG: 871 | Disposition: A | Discharge status: Skilled Nursing Facility | Payer: Medicare Other | Attending: Internal Medicine | Admitting: Internal Medicine

## 2020-11-07 ENCOUNTER — Emergency Department: Payer: Medicare Other

## 2020-11-07 DIAGNOSIS — I10 Essential (primary) hypertension: Secondary | ICD-10-CM | POA: Diagnosis present

## 2020-11-07 DIAGNOSIS — N390 Urinary tract infection, site not specified: Secondary | ICD-10-CM | POA: Diagnosis not present

## 2020-11-07 DIAGNOSIS — Z743 Need for continuous supervision: Secondary | ICD-10-CM | POA: Diagnosis not present

## 2020-11-07 DIAGNOSIS — Z8249 Family history of ischemic heart disease and other diseases of the circulatory system: Secondary | ICD-10-CM | POA: Diagnosis not present

## 2020-11-07 DIAGNOSIS — Z20822 Contact with and (suspected) exposure to covid-19: Secondary | ICD-10-CM | POA: Diagnosis present

## 2020-11-07 DIAGNOSIS — E119 Type 2 diabetes mellitus without complications: Secondary | ICD-10-CM | POA: Diagnosis not present

## 2020-11-07 DIAGNOSIS — E785 Hyperlipidemia, unspecified: Secondary | ICD-10-CM | POA: Diagnosis not present

## 2020-11-07 DIAGNOSIS — Z833 Family history of diabetes mellitus: Secondary | ICD-10-CM | POA: Diagnosis not present

## 2020-11-07 DIAGNOSIS — Z0389 Encounter for observation for other suspected diseases and conditions ruled out: Secondary | ICD-10-CM | POA: Diagnosis not present

## 2020-11-07 DIAGNOSIS — R6889 Other general symptoms and signs: Secondary | ICD-10-CM | POA: Diagnosis not present

## 2020-11-07 DIAGNOSIS — E1165 Type 2 diabetes mellitus with hyperglycemia: Secondary | ICD-10-CM | POA: Diagnosis not present

## 2020-11-07 DIAGNOSIS — A419 Sepsis, unspecified organism: Secondary | ICD-10-CM | POA: Diagnosis not present

## 2020-11-07 DIAGNOSIS — F209 Schizophrenia, unspecified: Secondary | ICD-10-CM | POA: Diagnosis present

## 2020-11-07 DIAGNOSIS — F32A Depression, unspecified: Secondary | ICD-10-CM | POA: Diagnosis not present

## 2020-11-07 DIAGNOSIS — E876 Hypokalemia: Secondary | ICD-10-CM | POA: Diagnosis not present

## 2020-11-07 DIAGNOSIS — Z79899 Other long term (current) drug therapy: Secondary | ICD-10-CM

## 2020-11-07 DIAGNOSIS — G9341 Metabolic encephalopathy: Secondary | ICD-10-CM | POA: Diagnosis not present

## 2020-11-07 DIAGNOSIS — W06XXXA Fall from bed, initial encounter: Secondary | ICD-10-CM | POA: Diagnosis present

## 2020-11-07 DIAGNOSIS — I2510 Atherosclerotic heart disease of native coronary artery without angina pectoris: Secondary | ICD-10-CM | POA: Diagnosis present

## 2020-11-07 DIAGNOSIS — R1312 Dysphagia, oropharyngeal phase: Secondary | ICD-10-CM | POA: Diagnosis not present

## 2020-11-07 DIAGNOSIS — A4151 Sepsis due to Escherichia coli [E. coli]: Principal | ICD-10-CM | POA: Diagnosis present

## 2020-11-07 DIAGNOSIS — Z811 Family history of alcohol abuse and dependence: Secondary | ICD-10-CM

## 2020-11-07 DIAGNOSIS — M6281 Muscle weakness (generalized): Secondary | ICD-10-CM | POA: Diagnosis not present

## 2020-11-07 DIAGNOSIS — M2550 Pain in unspecified joint: Secondary | ICD-10-CM | POA: Diagnosis not present

## 2020-11-07 DIAGNOSIS — Z7984 Long term (current) use of oral hypoglycemic drugs: Secondary | ICD-10-CM

## 2020-11-07 DIAGNOSIS — E11649 Type 2 diabetes mellitus with hypoglycemia without coma: Secondary | ICD-10-CM

## 2020-11-07 DIAGNOSIS — Z809 Family history of malignant neoplasm, unspecified: Secondary | ICD-10-CM

## 2020-11-07 DIAGNOSIS — N3000 Acute cystitis without hematuria: Secondary | ICD-10-CM | POA: Diagnosis present

## 2020-11-07 DIAGNOSIS — I499 Cardiac arrhythmia, unspecified: Secondary | ICD-10-CM | POA: Diagnosis not present

## 2020-11-07 DIAGNOSIS — R000 Tachycardia, unspecified: Secondary | ICD-10-CM | POA: Diagnosis not present

## 2020-11-07 DIAGNOSIS — F17210 Nicotine dependence, cigarettes, uncomplicated: Secondary | ICD-10-CM | POA: Diagnosis present

## 2020-11-07 DIAGNOSIS — R4182 Altered mental status, unspecified: Secondary | ICD-10-CM

## 2020-11-07 DIAGNOSIS — R6520 Severe sepsis without septic shock: Secondary | ICD-10-CM | POA: Diagnosis not present

## 2020-11-07 DIAGNOSIS — I700 Atherosclerosis of aorta: Secondary | ICD-10-CM | POA: Diagnosis not present

## 2020-11-07 DIAGNOSIS — E1169 Type 2 diabetes mellitus with other specified complication: Secondary | ICD-10-CM

## 2020-11-07 DIAGNOSIS — Z7401 Bed confinement status: Secondary | ICD-10-CM | POA: Diagnosis not present

## 2020-11-07 DIAGNOSIS — R404 Transient alteration of awareness: Secondary | ICD-10-CM | POA: Diagnosis not present

## 2020-11-07 DIAGNOSIS — K219 Gastro-esophageal reflux disease without esophagitis: Secondary | ICD-10-CM | POA: Diagnosis not present

## 2020-11-07 LAB — URINALYSIS, COMPLETE (UACMP) WITH MICROSCOPIC
Bilirubin Urine: NEGATIVE
Glucose, UA: 50 mg/dL — AB
Ketones, ur: NEGATIVE mg/dL
Nitrite: POSITIVE — AB
Protein, ur: 100 mg/dL — AB
Specific Gravity, Urine: 1.021 (ref 1.005–1.030)
pH: 5 (ref 5.0–8.0)

## 2020-11-07 LAB — PROTIME-INR
INR: 1.1 (ref 0.8–1.2)
Prothrombin Time: 14.6 seconds (ref 11.4–15.2)

## 2020-11-07 LAB — CBC WITH DIFFERENTIAL/PLATELET
Abs Immature Granulocytes: 0.02 10*3/uL (ref 0.00–0.07)
Basophils Absolute: 0 10*3/uL (ref 0.0–0.1)
Basophils Relative: 0 %
Eosinophils Absolute: 0 10*3/uL (ref 0.0–0.5)
Eosinophils Relative: 0 %
HCT: 39.9 % (ref 36.0–46.0)
Hemoglobin: 12.9 g/dL (ref 12.0–15.0)
Immature Granulocytes: 0 %
Lymphocytes Relative: 6 %
Lymphs Abs: 0.4 10*3/uL — ABNORMAL LOW (ref 0.7–4.0)
MCH: 29.7 pg (ref 26.0–34.0)
MCHC: 32.3 g/dL (ref 30.0–36.0)
MCV: 91.9 fL (ref 80.0–100.0)
Monocytes Absolute: 0.6 10*3/uL (ref 0.1–1.0)
Monocytes Relative: 8 %
Neutro Abs: 5.7 10*3/uL (ref 1.7–7.7)
Neutrophils Relative %: 86 %
Platelets: 182 10*3/uL (ref 150–400)
RBC: 4.34 MIL/uL (ref 3.87–5.11)
RDW: 13 % (ref 11.5–15.5)
WBC: 6.7 10*3/uL (ref 4.0–10.5)
nRBC: 0 % (ref 0.0–0.2)

## 2020-11-07 LAB — COMPREHENSIVE METABOLIC PANEL
ALT: 16 U/L (ref 0–44)
AST: 31 U/L (ref 15–41)
Albumin: 3.8 g/dL (ref 3.5–5.0)
Alkaline Phosphatase: 40 U/L (ref 38–126)
Anion gap: 13 (ref 5–15)
BUN: 15 mg/dL (ref 8–23)
CO2: 22 mmol/L (ref 22–32)
Calcium: 9 mg/dL (ref 8.9–10.3)
Chloride: 101 mmol/L (ref 98–111)
Creatinine, Ser: 0.75 mg/dL (ref 0.44–1.00)
GFR, Estimated: >60 mL/min (ref >60)
Glucose, Bld: 260 mg/dL — ABNORMAL HIGH (ref 70–99)
Potassium: 3.8 mmol/L (ref 3.5–5.1)
Sodium: 136 mmol/L (ref 135–145)
Total Bilirubin: 0.6 mg/dL (ref 0.3–1.2)
Total Protein: 7.6 g/dL (ref 6.5–8.1)

## 2020-11-07 LAB — MAGNESIUM: Magnesium: 1.7 mg/dL (ref 1.7–2.4)

## 2020-11-07 LAB — RESP PANEL BY RT-PCR (FLU A&B, COVID) ARPGX2
Influenza A by PCR: NEGATIVE
Influenza B by PCR: NEGATIVE
SARS Coronavirus 2 by RT PCR: NEGATIVE

## 2020-11-07 LAB — TROPONIN I (HIGH SENSITIVITY): Troponin I (High Sensitivity): 67 ng/L — ABNORMAL HIGH (ref <18)

## 2020-11-07 LAB — APTT: aPTT: 34 seconds (ref 24–36)

## 2020-11-07 LAB — LACTIC ACID, PLASMA: Lactic Acid, Venous: 3.5 mmol/L (ref 0.5–1.9)

## 2020-11-07 MED ORDER — LACTATED RINGERS IV BOLUS (SEPSIS)
1000.0000 mL | Freq: Once | INTRAVENOUS | Status: AC
  Administered 2020-11-07: 1000 mL via INTRAVENOUS

## 2020-11-07 MED ORDER — ZOLPIDEM TARTRATE 5 MG PO TABS: 5.0000 mg | ORAL_TABLET | Freq: Every evening | ORAL | Status: DC | PRN

## 2020-11-07 MED ORDER — HYDROCODONE-ACETAMINOPHEN 5-325 MG PO TABS: 1.0000 | ORAL_TABLET | ORAL | Status: DC | PRN

## 2020-11-07 MED ORDER — ONDANSETRON HCL 4 MG/2ML IJ SOLN: 4.0000 mg | Freq: Four times a day (QID) | INTRAMUSCULAR | Status: DC | PRN

## 2020-11-07 MED ORDER — SENNOSIDES-DOCUSATE SODIUM 8.6-50 MG PO TABS: 1.0000 | ORAL_TABLET | Freq: Every evening | ORAL | Status: DC | PRN

## 2020-11-07 MED ORDER — SODIUM CHLORIDE 0.9 % IV SOLN
1.0000 g | INTRAVENOUS | Status: DC
  Administered 2020-11-08 – 2020-11-10 (×3): 1 g via INTRAVENOUS
  Filled 2020-11-07 (×4): qty 10

## 2020-11-07 MED ORDER — ACETAMINOPHEN 650 MG RE SUPP: 650.0000 mg | Freq: Four times a day (QID) | RECTAL | Status: DC | PRN

## 2020-11-07 MED ORDER — INSULIN ASPART 100 UNIT/ML IJ SOLN
0.0000 [IU] | Freq: Three times a day (TID) | INTRAMUSCULAR | Status: DC
  Administered 2020-11-08: 3 [IU] via SUBCUTANEOUS
  Administered 2020-11-09 (×2): 2 [IU] via SUBCUTANEOUS
  Administered 2020-11-09: 3 [IU] via SUBCUTANEOUS
  Administered 2020-11-10: 2 [IU] via SUBCUTANEOUS
  Administered 2020-11-11: 3 [IU] via SUBCUTANEOUS
  Filled 2020-11-07 (×6): qty 1

## 2020-11-07 MED ORDER — VANCOMYCIN HCL IN DEXTROSE 1-5 GM/200ML-% IV SOLN
1000.0000 mg | Freq: Once | INTRAVENOUS | Status: AC
  Administered 2020-11-08: 1000 mg via INTRAVENOUS
  Filled 2020-11-07: qty 200

## 2020-11-07 MED ORDER — ACETAMINOPHEN 325 MG PO TABS
650.0000 mg | ORAL_TABLET | Freq: Four times a day (QID) | ORAL | Status: DC | PRN
  Administered 2020-11-08 – 2020-11-10 (×4): 650 mg via ORAL
  Filled 2020-11-07 (×5): qty 2

## 2020-11-07 MED ORDER — METRONIDAZOLE 500 MG/100ML IV SOLN
500.0000 mg | Freq: Once | INTRAVENOUS | Status: AC
  Administered 2020-11-07: 500 mg via INTRAVENOUS
  Filled 2020-11-07: qty 100

## 2020-11-07 MED ORDER — ENOXAPARIN SODIUM 40 MG/0.4ML IJ SOSY
40.0000 mg | PREFILLED_SYRINGE | INTRAMUSCULAR | Status: DC
  Administered 2020-11-08 – 2020-11-11 (×4): 40 mg via SUBCUTANEOUS
  Filled 2020-11-07 (×4): qty 0.4

## 2020-11-07 MED ORDER — SODIUM CHLORIDE 0.9 % IV SOLN
2.0000 g | Freq: Once | INTRAVENOUS | Status: AC
  Administered 2020-11-07: 2 g via INTRAVENOUS
  Filled 2020-11-07: qty 2

## 2020-11-07 MED ORDER — ONDANSETRON HCL 4 MG PO TABS: 4.0000 mg | ORAL_TABLET | Freq: Four times a day (QID) | ORAL | Status: DC | PRN

## 2020-11-07 MED ORDER — INSULIN ASPART 100 UNIT/ML IJ SOLN: 0.0000 [IU] | Freq: Every day | INTRAMUSCULAR | Status: DC

## 2020-11-07 MED ORDER — LACTATED RINGERS IV SOLN: INTRAVENOUS | Status: AC

## 2020-11-07 NOTE — ED Triage Notes (Signed)
Patient alert to name only.  Does not know where she is or the events leading up to this visit.  EMS reports that she does not take blood thinners, unsure if she hit head or LOC during the time that she fell out of bed.  Patient is able to tell me that she lives at home.

## 2020-11-07 NOTE — H&P (Signed)
History and Physical    Erica Mueller ZOX:[id number] DOB: 25-Jan-1951 DOA: 11/07/2020  PCP: No primary care provider on file.   Patient coming from: Home  I have personally briefly reviewed patient's old medical records in [facility]  Chief Complaint: Altered mental status  HPI: Erica Mueller is a 70 y.o. female with medical history significant for HTN, DM, schizophrenia, depression, brought to the emergency room after she slid out of bed and was noted to be altered since then.  History limited and is taken mostly from ER records.  She reportedly had no prior cough or shortness of breath, nausea, vomiting, abdominal pain or change in bowel habits.  Was febrile with EMS. ED course: On arrival, oriented to person only.  Temperature 102, tachycardia at 127, tachypneic at 35, BP 155/68, O2 sat 97% on room air.  Blood work significant for leukocytosis of 6700 with lactic acid 3.5.  Urinalysis with pyuria.  CMP unremarkable except for elevated blood sugar of 260.  Troponin 67.  Magnesium normal at 1.7 EKG, personally viewed and interpreted: Sinus tachycardia at 119 with nonspecific ST-T wave changes Imaging: Chest x-ray with no active disease CT head no acute intracranial abnormality  Patient started on sepsis fluid bolus and initially administered cefepime, vancomycin and metronidazole for sepsis of unknown source.  Hospitalist consulted for admission for sepsis secondary to UTI.  Review of Systems: Unreliable due to altered mental status   Past Medical History:  Diagnosis Date  . Coronary artery disease   . Depression   . Diabetes mellitus without complication (HCC)   . Elevated lipids   . GERD (gastroesophageal reflux disease)   . Hypertension   . Schizophrenia (HCC)   . Syphilis (acquired)     Past Surgical History:  Procedure Laterality Date  . COLONOSCOPY    . COLONOSCOPY WITH PROPOFOL N/A 09/01/2015   Procedure: COLONOSCOPY WITH PROPOFOL;  Surgeon: Wallace Cullens, MD;   Location: [facility] ENDOSCOPY;  Service: Gastroenterology;  Laterality: N/A;  . TUBAL LIGATION    . wisdom teeth removal       reports that she has been smoking cigarettes. She started smoking about 50 years ago. She has a 48.00 pack-year smoking history. She has never used smokeless tobacco. She reports current alcohol use of about 3.0 standard drinks of alcohol per week. She reports previous drug use. Drug: Marijuana.  No Known Allergies  Family History  Problem Relation Age of Onset  . Cancer Mother   . Alcoholism Mother   . Bone cancer Father   . Heart disease Father   . Diabetes Maternal Grandmother   . Suicidality Paternal Uncle   . Breast cancer Neg Hx      Prior to Admission medications   Medication Sig Start Date End Date Taking? Authorizing Provider  amitriptyline (ELAVIL) 25 MG tablet Take 1 tablet (25 mg total) by mouth at bedtime. 10/24/17   McNew, Ileene Hutchinson, MD  amLODipine (NORVASC) 5 MG tablet Take 1 tablet (5 mg total) by mouth daily. 12/20/17   Duanne Limerick, MD  lovastatin (MEVACOR) 40 MG tablet Take 1 tablet (40 mg total) by mouth at bedtime. 12/20/17   Duanne Limerick, MD  metFORMIN (GLUCOPHAGE) 500 MG tablet Take 1 tablet (500 mg total) by mouth 2 (two) times daily with a meal. 12/20/17   Loleta Rose, MD  paliperidone (INVEGA SUSTENNA) 234 MG/1.5ML SUSY injection Inject 234 mg into the muscle once for 1 dose. Next injection due 11/20/17 11/20/17  11/20/17  McNew, Ileene Hutchinson, MD  pioglitazone (ACTOS) 15 MG tablet Take 1 tablet (15 mg total) by mouth daily. 12/20/17   Duanne Limerick, MD  thiothixene (NAVANE) 2 MG capsule Take 1 capsule (2 mg total) by mouth 2 (two) times daily. 10/24/17   Haskell Riling, MD    Physical Exam: Vitals:   11/07/20 2151 11/07/20 2154 11/07/20 2252  BP: (!) 155/68  (!) 154/55  Pulse: (!) 127  (!) 115  Resp: 20  (!) 22  Temp: (!) 102 F (38.9 C)    TempSrc: Oral    SpO2:  97% 98%     Vitals:   11/07/20 2151 11/07/20 2154 11/07/20 2252  BP:  (!) 155/68  (!) 154/55  Pulse: (!) 127  (!) 115  Resp: 20  (!) 22  Temp: (!) 102 F (38.9 C)    TempSrc: Oral    SpO2:  97% 98%      Constitutional:  Oriented to person. Not in any apparent distress HEENT:      Head: Normocephalic and atraumatic.         Eyes: PERLA, EOMI, Conjunctivae are normal. Sclera is non-icteric.       Mouth/Throat: Mucous membranes are moist.       Neck: Supple with no signs of meningismus. Cardiovascular: Regular rate and rhythm. No murmurs, gallops, or rubs. 2+ symmetrical distal pulses are present . No JVD. No LE edema Respiratory: Respiratory effort normal .Lungs sounds clear bilaterally. No wheezes, crackles, or rhonchi.  Gastrointestinal: Soft, non tender, and non distended with positive bowel sounds.  Genitourinary: No CVA tenderness. Musculoskeletal: Nontender with normal range of motion in all extremities. No cyanosis, or erythema of extremities. Neurologic:  Face is symmetric. Moving all extremities. No gross focal neurologic deficits . Skin: Skin is warm, dry.  No rash or ulcers Psychiatric: Unable to assess due to altered mental status   Labs on Admission: I have personally reviewed following labs and imaging studies  CBC: Recent Labs  Lab 11/07/20 2155  WBC 6.7  NEUTROABS 5.7  HGB 12.9  HCT 39.9  MCV 91.9  PLT 182   Basic Metabolic Panel: Recent Labs  Lab 11/07/20 2155  NA 136  K 3.8  CL 101  CO2 22  GLUCOSE 260*  BUN 15  CREATININE 0.75  CALCIUM 9.0  MG 1.7   GFR: CrCl cannot be calculated (Unknown ideal weight.). Liver Function Tests: Recent Labs  Lab 11/07/20 2155  AST 31  ALT 16  ALKPHOS 40  BILITOT 0.6  PROT 7.6  ALBUMIN 3.8   No results for input(s): LIPASE, AMYLASE in the last 168 hours. No results for input(s): AMMONIA in the last 168 hours. Coagulation Profile: Recent Labs  Lab 11/07/20 2155  INR 1.1   Cardiac Enzymes: No results for input(s): CKTOTAL, CKMB, CKMBINDEX, TROPONINI in the last 168  hours. BNP (last 3 results) No results for input(s): PROBNP in the last 8760 hours. HbA1C: No results for input(s): HGBA1C in the last 72 hours. CBG: No results for input(s): GLUCAP in the last 168 hours. Lipid Profile: No results for input(s): CHOL, HDL, LDLCALC, TRIG, CHOLHDL, LDLDIRECT in the last 72 hours. Thyroid Function Tests: No results for input(s): TSH, T4TOTAL, FREET4, T3FREE, THYROIDAB in the last 72 hours. Anemia Panel: No results for input(s): VITAMINB12, FOLATE, FERRITIN, TIBC, IRON, RETICCTPCT in the last 72 hours. Urine analysis:    Component Value Date/Time   COLORURINE YELLOW (A) 11/07/2020 2213   APPEARANCEUR HAZY (A)  11/07/2020 2213   LABSPEC 1.021 11/07/2020 2213   PHURINE 5.0 11/07/2020 2213   GLUCOSEU 50 (A) 11/07/2020 2213   HGBUR LARGE (A) 11/07/2020 2213   BILIRUBINUR NEGATIVE 11/07/2020 2213   KETONESUR NEGATIVE 11/07/2020 2213   PROTEINUR 100 (A) 11/07/2020 2213   NITRITE POSITIVE (A) 11/07/2020 2213   LEUKOCYTESUR TRACE (A) 11/07/2020 2213    Radiological Exams on Admission: CT Head Wo Contrast  Result Date: 11/07/2020 CLINICAL DATA:  Mental status change EXAM: CT HEAD WITHOUT CONTRAST TECHNIQUE: Contiguous axial images were obtained from the base of the skull through the vertex without intravenous contrast. COMPARISON:  CT brain 06/20/2016 FINDINGS: Brain: No acute territorial infarction, hemorrhage or intracranial mass. Mild atrophy. Patchy white matter hypodensity consistent with chronic small vessel ischemic change. Stable ventricle size. Suspected chronic lacunar infarct within the left thalamus. Vascular: No hyperdense vessels.  Carotid vascular calcification Skull: Normal. Negative for fracture or focal lesion. Sinuses/Orbits: Moderate mucosal thickening in the maxillary and ethmoid sinuses Other: None IMPRESSION: 1. No CT evidence for acute intracranial abnormality. 2. Atrophy and mild chronic small vessel ischemic change of the white matter.  Electronically Signed   By: Jasmine Pang M.D.   On: 11/07/2020 23:38   DG Chest Port 1 View  Result Date: 11/07/2020 CLINICAL DATA:  Possible sepsis EXAM: PORTABLE CHEST 1 VIEW COMPARISON:  08/20/2005 FINDINGS: No acute airspace disease or effusion. Borderline cardiac size. Aortic atherosclerosis. No pneumothorax. IMPRESSION: No active disease. Electronically Signed   By: Jasmine Pang M.D.   On: 11/07/2020 22:06     Assessment/Plan 70 year old female with history of HTN, DM, schizophrenia, depression, presenting after falling out of the bed noted to have altered mental status..     Severe sepsis (HCC)   UTI - Sepsis criteria include fever to 102, pulse 127, tachypnea 35 with altered mental status, normal WBC of 6700 but elevated lactic acid of 3.5, urinalysis with pyuria - Continue sepsis fluids - IV Rocephin - Follow cultures    Acute metabolic encephalopathy secondary to sepsis - Patient oriented to person only on arrival but with nonfocal exam - CT head with no acute intracranial abnormality - Fall and aspiration precautions    Diabetes with hyperglycemia - Blood sugar 260 - Sliding scale insulin    Hypertension - Continue home amlodipine given slightly elevated blood pressures    Schizophrenia (HCC) - Resume home meds pending med rec    DVT prophylaxis: Lovenox  Code Status: full code  Family Communication: Granddaughter at bedside Disposition Plan: Back to previous home environment Consults called: none  Status:At the time of admission, it appears that the appropriate admission status for this patient is INPATIENT. This is judged to be reasonable and necessary in order to provide the required intensity of service to ensure the patient's safety given the presenting symptoms, physical exam findings, and initial radiographic and laboratory data in the context of their  Comorbid conditions.   Patient requires inpatient status due to high intensity of service, high risk for  further deterioration and high frequency of surveillance required.   I certify that at the point of admission it is my clinical judgment that the patient will require inpatient hospital care spanning beyond 2 midnights     Andris Baumann MD [facility]     11/07/2020, 11:47 PM

## 2020-11-07 NOTE — ED Provider Notes (Signed)
[facility] Emergency Department Provider Note   ____________________________________________   Event Date/Time   First MD Initiated Contact with Patient 11/07/20 2147     (approximate)  I have reviewed the triage vital signs and the nursing notes.   HISTORY  Chief Complaint Altered Mental Status (BIBA:  EMS states that patient fell out of bed around 1900 and has had been altered since that time.  Patient aox1, name only.  )    HPI Erica Mueller is a 70 y.o. female with past medical history of schizophrenia, hypertension, hyperlipidemia, diabetes, and CAD who presents to the ED for altered mental status.  History is limited due to patient's altered mental status and majority of history is obtained from EMS.  They state that they were called by family after patient fell out of bed around 7 PM, noted to be altered since then.  She does not take any blood thinners and currently denies any areas of pain.  She denies any cough, chest pain, shortness of breath, nausea, vomiting, abdominal pain, dysuria, or hematuria.  Patient noted to be febrile with EMS.  It is unclear what her baseline mental status is.        Past Medical History:  Diagnosis Date  . Coronary artery disease   . Depression   . Diabetes mellitus without complication (HCC)   . Elevated lipids   . GERD (gastroesophageal reflux disease)   . Hypertension   . Schizophrenia (HCC)   . Syphilis (acquired)     Patient Active Problem List   Diagnosis Date Noted  . Acute metabolic encephalopathy 11/07/2020  . GERD (gastroesophageal reflux disease) 12/20/2017  . Hyperlipemia 12/20/2017  . History of schizophrenia 07/30/2017  . Schizophrenia (HCC) 05/27/2017  . Diabetes (HCC) 04/02/2017  . Hypertension 04/02/2017  . Noncompliance 04/02/2017  . Depression 11/17/2013    Past Surgical History:  Procedure Laterality Date  . COLONOSCOPY    . COLONOSCOPY WITH PROPOFOL N/A 09/01/2015   Procedure:  COLONOSCOPY WITH PROPOFOL;  Surgeon: Wallace Cullens, MD;  Location: [facility] ENDOSCOPY;  Service: Gastroenterology;  Laterality: N/A;  . TUBAL LIGATION    . wisdom teeth removal      Prior to Admission medications   Medication Sig Start Date End Date Taking? Authorizing Provider  amitriptyline (ELAVIL) 25 MG tablet Take 1 tablet (25 mg total) by mouth at bedtime. 10/24/17   McNew, Ileene Hutchinson, MD  amLODipine (NORVASC) 5 MG tablet Take 1 tablet (5 mg total) by mouth daily. 12/20/17   Duanne Limerick, MD  lovastatin (MEVACOR) 40 MG tablet Take 1 tablet (40 mg total) by mouth at bedtime. 12/20/17   Duanne Limerick, MD  metFORMIN (GLUCOPHAGE) 500 MG tablet Take 1 tablet (500 mg total) by mouth 2 (two) times daily with a meal. 12/20/17   Loleta Rose, MD  paliperidone (INVEGA SUSTENNA) 234 MG/1.5ML SUSY injection Inject 234 mg into the muscle once for 1 dose. Next injection due 11/20/17 11/20/17 11/20/17  McNew, Ileene Hutchinson, MD  pioglitazone (ACTOS) 15 MG tablet Take 1 tablet (15 mg total) by mouth daily. 12/20/17   Duanne Limerick, MD  thiothixene (NAVANE) 2 MG capsule Take 1 capsule (2 mg total) by mouth 2 (two) times daily. 10/24/17   McNew, Ileene Hutchinson, MD    Allergies Patient has no known allergies.  Family History  Problem Relation Age of Onset  . Cancer Mother   . Alcoholism Mother   . Bone cancer Father   .  Heart disease Father   . Diabetes Maternal Grandmother   . Suicidality Paternal Uncle   . Breast cancer Neg Hx     Social History Social History   Tobacco Use  . Smoking status: Current Every Day Smoker    Packs/day: 1.00    Years: 48.00    Pack years: 48.00    Types: Cigarettes    Start date: 12/20/1969  . Smokeless tobacco: Never Used  Vaping Use  . Vaping Use: Never used  Substance Use Topics  . Alcohol use: Yes    Alcohol/week: 3.0 standard drinks    Types: 2 Cans of beer, 1 Shots of liquor per week  . Drug use: Not Currently    Types: Marijuana    Comment: PAST     Review of  Systems  Constitutional: No fever/chills.  Positive for fall and confusion. Eyes: No visual changes. ENT: No sore throat. Cardiovascular: Denies chest pain. Respiratory: Denies shortness of breath. Gastrointestinal: No abdominal pain.  No nausea, no vomiting.  No diarrhea.  No constipation. Genitourinary: Negative for dysuria. Musculoskeletal: Negative for back pain. Skin: Negative for rash. Neurological: Negative for headaches, focal weakness or numbness.  ____________________________________________   PHYSICAL EXAM:  VITAL SIGNS: ED Triage Vitals  Enc Vitals Group     BP      Pulse      Resp      Temp      Temp src      SpO2      Weight      Height      Head Circumference      Peak Flow      Pain Score      Pain Loc      Pain Edu?      Excl. in GC?     Constitutional: Alert and oriented to person and place, but not time or situation. Eyes: Conjunctivae are normal.  Pupils equal round and reactive to light bilaterally. Head: Atraumatic. Nose: No congestion/rhinnorhea. Mouth/Throat: Mucous membranes are moist. Neck: Normal ROM Cardiovascular: Tachycardic, regular rhythm. Grossly normal heart sounds. Respiratory: Normal respiratory effort.  No retractions. Lungs CTAB. Gastrointestinal: Soft and nontender. No distention. Genitourinary: deferred Musculoskeletal: No lower extremity tenderness nor edema. Neurologic:  Normal speech and language. No gross focal neurologic deficits are appreciated. Skin:  Skin is warm, dry and intact. No rash noted. Psychiatric: Mood and affect are normal. Speech and behavior are normal.  ____________________________________________   LABS (all labs ordered are listed, but only abnormal results are displayed)  Labs Reviewed  LACTIC ACID, PLASMA - Abnormal; Notable for the following components:      Result Value   Lactic Acid, Venous 3.5 (*)    All other components within normal limits  COMPREHENSIVE METABOLIC PANEL - Abnormal;  Notable for the following components:   Glucose, Bld 260 (*)    All other components within normal limits  CBC WITH DIFFERENTIAL/PLATELET - Abnormal; Notable for the following components:   Lymphs Abs 0.4 (*)    All other components within normal limits  URINALYSIS, COMPLETE (UACMP) WITH MICROSCOPIC - Abnormal; Notable for the following components:   Color, Urine YELLOW (*)    APPearance HAZY (*)    Glucose, UA 50 (*)    Hgb urine dipstick LARGE (*)    Protein, ur 100 (*)    Nitrite POSITIVE (*)    Leukocytes,Ua TRACE (*)    Bacteria, UA MANY (*)    All other components within normal limits  TROPONIN I (HIGH SENSITIVITY) - Abnormal; Notable for the following components:   Troponin I (High Sensitivity) 67 (*)    All other components within normal limits  RESP PANEL BY RT-PCR (FLU A&B, COVID) ARPGX2  CULTURE, BLOOD (ROUTINE X 2)  CULTURE, BLOOD (ROUTINE X 2)  URINE CULTURE  PROTIME-INR  APTT  MAGNESIUM  LACTIC ACID, PLASMA  TROPONIN I (HIGH SENSITIVITY)   ____________________________________________  EKG  ED ECG REPORT I, Chesley Noon, the attending physician, personally viewed and interpreted this ECG.   Date: 11/07/2020  EKG Time: 21:59  Rate: 119  Rhythm: sinus tachycardia  Axis: LAD  Intervals:Prolonged QT  ST&T Change: None   PROCEDURES  Procedure(s) performed (including Critical Care):  .Critical Care Performed by: Chesley Noon, MD Authorized by: Chesley Noon, MD   Critical care provider statement:    Critical care time (minutes):  45   Critical care time was exclusive of:  Separately billable procedures and treating other patients and teaching time   Critical care was necessary to treat or prevent imminent or life-threatening deterioration of the following conditions:  Sepsis   Critical care was time spent personally by me on the following activities:  Discussions with consultants, evaluation of patient's response to treatment, examination of  patient, ordering and performing treatments and interventions, ordering and review of laboratory studies, ordering and review of radiographic studies, pulse oximetry, re-evaluation of patient's condition, obtaining history from patient or surrogate and review of old charts   I assumed direction of critical care for this patient from another provider in my specialty: no     Care discussed with: admitting provider       ____________________________________________   INITIAL IMPRESSION / ASSESSMENT AND PLAN / ED COURSE       70 year old female with past medical history of hypertension, hyperlipidemia, diabetes, CAD, and schizophrenia who presents to the ED for altered mental status reportedly since 7 PM when she fell out of bed.  Patient has no obvious signs of trauma to her head but given fall we will check CT head.  More likely cause of her altered mental status seems to be sepsis as she is noted to be febrile and tachycardic.  We will initiate IV fluid resuscitation and broad-spectrum antibiotics.  No obvious source of infection at this time, UA and chest x-ray are pending.  She has no abdominal tenderness to suggest intra-abdominal infection.  CT head reviewed by me with no obvious hemorrhage, official read by radiology is pending.  Labs remarkable for lactic acid and UTI, which is the likely source of her sepsis.  Heart rate is gradually improving with IV fluids and blood pressure remained stable, patient is receiving broad-spectrum antibiotics.  Chest x-ray reviewed by me and shows no infiltrate, edema, or effusion.  Case discussed with hospitalist for admission.      ____________________________________________   FINAL CLINICAL IMPRESSION(S) / ED DIAGNOSES  Final diagnoses:  Sepsis without acute organ dysfunction, due to unspecified organism (HCC)  Altered mental status, unspecified altered mental status type  Acute cystitis without hematuria     ED Discharge Orders    None        Note:  This document was prepared using Dragon voice recognition software and may include unintentional dictation errors.   Chesley Noon, MD 11/07/20 [phone]

## 2020-11-07 NOTE — ED Notes (Signed)
Granddaughter is now at bedside.  Advises that the patient has missed a few appointments recently due to [place] not having a car [place]  Granddaughter advises the patient has been normal today up till around 1900.

## 2020-11-07 NOTE — Sepsis Progress Note (Signed)
Following for Code Sepsis  

## 2020-11-07 NOTE — ED Notes (Signed)
Notified Dr. Larinda Buttery of Lactic and Troponin.

## 2020-11-08 DIAGNOSIS — A419 Sepsis, unspecified organism: Secondary | ICD-10-CM | POA: Diagnosis not present

## 2020-11-08 DIAGNOSIS — G9341 Metabolic encephalopathy: Secondary | ICD-10-CM | POA: Diagnosis not present

## 2020-11-08 DIAGNOSIS — F209 Schizophrenia, unspecified: Secondary | ICD-10-CM

## 2020-11-08 DIAGNOSIS — N3000 Acute cystitis without hematuria: Secondary | ICD-10-CM

## 2020-11-08 LAB — BLOOD CULTURE ID PANEL (REFLEXED) - BCID2

## 2020-11-08 LAB — BASIC METABOLIC PANEL
Anion gap: 10 (ref 5–15)
BUN: 9 mg/dL (ref 8–23)
CO2: 25 mmol/L (ref 22–32)
Calcium: 9 mg/dL (ref 8.9–10.3)
Chloride: 102 mmol/L (ref 98–111)
Creatinine, Ser: 0.49 mg/dL (ref 0.44–1.00)
GFR, Estimated: >60 mL/min (ref >60)
Glucose, Bld: 115 mg/dL — ABNORMAL HIGH (ref 70–99)
Potassium: 3.3 mmol/L — ABNORMAL LOW (ref 3.5–5.1)
Sodium: 137 mmol/L (ref 135–145)

## 2020-11-08 LAB — CBC
HCT: 37.8 % (ref 36.0–46.0)
Hemoglobin: 12.6 g/dL (ref 12.0–15.0)
MCH: 30.2 pg (ref 26.0–34.0)
MCHC: 33.3 g/dL (ref 30.0–36.0)
MCV: 90.6 fL (ref 80.0–100.0)
Platelets: 169 10*3/uL (ref 150–400)
RBC: 4.17 MIL/uL (ref 3.87–5.11)
RDW: 13.1 % (ref 11.5–15.5)
WBC: 6.2 10*3/uL (ref 4.0–10.5)
nRBC: 0 % (ref 0.0–0.2)

## 2020-11-08 LAB — HEMOGLOBIN A1C
Hgb A1c MFr Bld: 6.7 % — ABNORMAL HIGH (ref 4.8–5.6)
Mean Plasma Glucose: 146 mg/dL

## 2020-11-08 LAB — CBG MONITORING, ED
Glucose-Capillary: 136 mg/dL — ABNORMAL HIGH (ref 70–99)
Glucose-Capillary: 111 mg/dL — ABNORMAL HIGH (ref 70–99)
Glucose-Capillary: 153 mg/dL — ABNORMAL HIGH (ref 70–99)
Glucose-Capillary: 195 mg/dL — ABNORMAL HIGH (ref 70–99)
Glucose-Capillary: 104 mg/dL — ABNORMAL HIGH (ref 70–99)

## 2020-11-08 LAB — HIV ANTIBODY (ROUTINE TESTING W REFLEX): HIV Screen 4th Generation wRfx: NONREACTIVE

## 2020-11-08 LAB — LACTIC ACID, PLASMA: Lactic Acid, Venous: 2.9 mmol/L (ref 0.5–1.9)

## 2020-11-08 LAB — CORTISOL-AM, BLOOD: Cortisol - AM: 34.7 ug/dL — ABNORMAL HIGH (ref 6.7–22.6)

## 2020-11-08 LAB — PROCALCITONIN: Procalcitonin: 0.1 ng/mL

## 2020-11-08 MED ORDER — PALIPERIDONE ER 3 MG PO TB24
6.0000 mg | ORAL_TABLET | Freq: Every day | ORAL | Status: DC
  Administered 2020-11-10 – 2020-11-11 (×2): 6 mg via ORAL
  Filled 2020-11-08 (×4): qty 2

## 2020-11-08 MED ORDER — AMITRIPTYLINE HCL 25 MG PO TABS: 25.0000 mg | ORAL_TABLET | Freq: Every day | ORAL | Status: DC

## 2020-11-08 MED ORDER — THIOTHIXENE 2 MG PO CAPS
2.0000 mg | ORAL_CAPSULE | Freq: Two times a day (BID) | ORAL | Status: DC
  Administered 2020-11-09 – 2020-11-11 (×4): 2 mg via ORAL
  Filled 2020-11-08 (×8): qty 1

## 2020-11-08 MED ORDER — CHLORPROMAZINE HCL 25 MG PO TABS
25.0000 mg | ORAL_TABLET | Freq: Two times a day (BID) | ORAL | Status: DC
  Administered 2020-11-09 – 2020-11-11 (×4): 25 mg via ORAL
  Filled 2020-11-08 (×8): qty 1

## 2020-11-08 MED ORDER — POTASSIUM CHLORIDE CRYS ER 20 MEQ PO TBCR
40.0000 meq | EXTENDED_RELEASE_TABLET | Freq: Once | ORAL | Status: DC
  Filled 2020-11-08: qty 2

## 2020-11-08 NOTE — ED Notes (Signed)
Family updated as to patient's status.  Patient resting at this time.  No acute distress noted.

## 2020-11-08 NOTE — ED Notes (Signed)
Pt resting in room. Pt noted to have urinated in bed. Bed bath performed. Bed changed and new linen placed on bed. Pt placed on purewick and depends. Cardiac, bp and pulse ox monitor on. Family is currently at bedside. Female RN helped me give pt bed bath and change her. Pt in new gown as well.  As per previous RN, no PO medications due to patient condition and not being fully awake.

## 2020-11-08 NOTE — ED Notes (Signed)
Bed changed, adult diaper changed and Purewick replaced.

## 2020-11-08 NOTE — Progress Notes (Signed)
[facility] PROGRESS NOTE   Erica Mueller SHF:[id number] DOB: December 18, 1950 DOA: 11/07/2020  PCP: [name]  Brief History/Interval Summary: 70 y.o. female with medical history significant for HTN, DM, schizophrenia, depression, brought to the emergency room after she slid out of bed and was noted to be altered since then.    Patient was noted to be afebrile.  UA was noted to be abnormal.  She was hospitalized for further management.    Reason for Visit: Sepsis due to UTI  Consultants: None  Procedures: None  Antibiotics: Anti-infectives (From admission, onward)   Start     Dose/Rate Route Frequency Ordered Stop   11/08/20 0600  cefTRIAXone (ROCEPHIN) 1 g in sodium chloride 0.9 % 100 mL IVPB        1 g 200 mL/hr over 30 Minutes Intravenous Every 24 hours 11/07/20 2347     11/07/20 2200  ceFEPIme (MAXIPIME) 2 g in sodium chloride 0.9 % 100 mL IVPB        2 g 200 mL/hr over 30 Minutes Intravenous  Once 11/07/20 2157 11/07/20 2250   11/07/20 2200  metroNIDAZOLE (FLAGYL) IVPB 500 mg        500 mg 100 mL/hr over 60 Minutes Intravenous  Once 11/07/20 2157 11/08/20 0017   11/07/20 2200  vancomycin (VANCOCIN) IVPB 1000 mg/200 mL premix        1,000 mg 200 mL/hr over 60 Minutes Intravenous  Once 11/07/20 2157 11/08/20 0130      Subjective/Interval History: Patient noted to be pleasantly confused.  Does not appear to be in any discomfort.  She denies any pain.     Assessment/Plan:  Urinary tract infection with severe sepsis She was noted to be febrile with tachycardia and tachypnea and altered mental status.  WBC is normal however lactic acid level was noted to be elevated.  UA was grossly abnormal.  Follow-up on cultures.  Continue with ceftriaxone.  Acute metabolic encephalopathy Likely secondary to sepsis.  CT head did not show any acute intracranial findings.  Does not seem to be agitated or delirious this morning.  No focal neurological deficits noted.   Continue to monitor.  Hypokalemia Will be repleted  Diabetes mellitus type 2 uncontrolled with hyperglycemia Continue CBGs and SSI.  Check HbA1c.  Noted to be on metformin at home.  History of essential hypertension Monitor blood pressures.  History of schizophrenia Resume home medications.  DVT Prophylaxis: Lovenox Code Status: Full code Family Communication: Discussed with patient.  No family at bedside Disposition Plan: Hopefully return home in improved  Status is: Inpatient  Remains inpatient appropriate because:Altered mental status and IV treatments appropriate due to intensity of illness or inability to take PO   Dispo: The patient is from: Home              Anticipated d/c is to: Home              Patient currently is not medically stable to d/c.   Difficult to place patient No     Medications:  Scheduled: . enoxaparin (LOVENOX) injection  40 mg Subcutaneous Q24H  . insulin aspart  0-15 Units Subcutaneous TID WC  . insulin aspart  0-5 Units Subcutaneous QHS   Continuous: . cefTRIAXone (ROCEPHIN)  IV Stopped (11/08/20 0548)  . lactated ringers 150 mL/hr at 11/08/20 0019   FOY:DXAJOINOMVEHM **OR** acetaminophen, HYDROcodone-acetaminophen, ondansetron **OR** ondansetron (ZOFRAN) IV, senna-docusate, zolpidem   Objective:  Vital Signs  Vitals:   11/08/20 0852 11/08/20  0865 11/08/20 0854 11/08/20 0900  BP:   (!) 144/69   Pulse: (!) 102 (!) 105 (!) 101 (!) 102  Resp:      Temp:      TempSrc:      SpO2: 96% 96% 98% 96%  Weight:      Height:        Intake/Output Summary (Last 24 hours) at 11/08/2020 0923 Last data filed at 11/08/2020 0548 Gross per 24 hour  Intake 400 ml  Output 1000 ml  Net -600 ml   Filed Weights   11/08/20 0018  Weight: 61.2 kg    General appearance: Awake alert.  In no distress Resp: Clear to auscultation bilaterally.  Normal effort Cardio: S1-S2 is normal regular.  No S3-S4.  No rubs murmurs or bruit GI: Abdomen is soft.   Nontender nondistended.  Bowel sounds are present normal.  No masses organomegaly Extremities: No edema.  Full range of motion of lower extremities. Neurologic: No focal neurological deficits.    Lab Results:  Data Reviewed: I have personally reviewed following labs and imaging studies  CBC: Recent Labs  Lab 11/07/20 2155 11/08/20 0716  WBC 6.7 6.2  NEUTROABS 5.7  --   HGB 12.9 12.6  HCT 39.9 37.8  MCV 91.9 90.6  PLT 182 169    Basic Metabolic Panel: Recent Labs  Lab 11/07/20 2155 11/08/20 0716  NA 136 137  K 3.8 3.3*  CL 101 102  CO2 22 25  GLUCOSE 260* 115*  BUN 15 9  CREATININE 0.75 0.49  CALCIUM 9.0 9.0  MG 1.7  --     GFR: Estimated Creatinine Clearance: 56.3 mL/min (by C-G formula based on SCr of 0.49 mg/dL).  Liver Function Tests: Recent Labs  Lab 11/07/20 2155  AST 31  ALT 16  ALKPHOS 40  BILITOT 0.6  PROT 7.6  ALBUMIN 3.8     Coagulation Profile: Recent Labs  Lab 11/07/20 2155  INR 1.1    CBG: Recent Labs  Lab 11/08/20 0109 11/08/20 0723  GLUCAP 136* 111*     Recent Results (from the past 240 hour(s))  Blood Culture (routine x 2)     Status: None (Preliminary result)   Collection Time: 11/07/20  9:55 PM   Specimen: BLOOD  Result Value Ref Range Status   Specimen Description BLOOD LEFT AC  Final   Special Requests   Final    BOTTLES DRAWN AEROBIC AND ANAEROBIC Blood Culture adequate volume   Culture   Final    NO GROWTH < 12 HOURS Performed at [facility], [address] [place], [place] [address]    Report Status PENDING  Incomplete  Resp Panel by RT-PCR (Flu A&B, Covid) Nasopharyngeal Swab     Status: None   Collection Time: 11/07/20 10:13 PM   Specimen: Nasopharyngeal Swab; Nasopharyngeal(NP) swabs in vial transport medium  Result Value Ref Range Status   SARS Coronavirus 2 by RT PCR NEGATIVE NEGATIVE Final    Comment: (NOTE) SARS-CoV-2 target nucleic acids are NOT DETECTED.  The SARS-CoV-2 RNA is  generally detectable in upper respiratory specimens during the acute phase of infection. The lowest concentration of SARS-CoV-2 viral copies this assay can detect is 138 copies/mL. A negative result does not preclude SARS-Cov-2 infection and should not be used as the sole basis for treatment or other patient management decisions. A negative result may occur with  improper specimen collection/handling, submission of specimen other than nasopharyngeal swab, presence of viral mutation(s) within the areas  targeted by this assay, and inadequate number of viral copies(<138 copies/mL). A negative result must be combined with clinical observations, patient history, and epidemiological information. The expected result is Negative.  Fact Sheet for Patients:  BloggerCourse.com  Fact Sheet for Healthcare Providers:  SeriousBroker.it  This test is no t yet approved or cleared by the [place] FDA and  has been authorized for detection and/or diagnosis of SARS-CoV-2 by FDA under an Emergency Use Authorization (EUA). This EUA will remain  in effect (meaning this test can be used) for the duration of the COVID-19 declaration under Section 564(b)(1) of the Act, 21 U.S.C.section 360bbb-3(b)(1), unless the authorization is terminated  or revoked sooner.       Influenza A by PCR NEGATIVE NEGATIVE Final   Influenza B by PCR NEGATIVE NEGATIVE Final    Comment: (NOTE) The Xpert Xpress SARS-CoV-2/FLU/RSV plus assay is intended as an aid in the diagnosis of influenza from Nasopharyngeal swab specimens and should not be used as a sole basis for treatment. Nasal washings and aspirates are unacceptable for Xpert Xpress SARS-CoV-2/FLU/RSV testing.  Fact Sheet for Patients: BloggerCourse.com  Fact Sheet for Healthcare Providers: SeriousBroker.it  This test is not yet approved or cleared by the [place] FDA and has been authorized for detection and/or diagnosis of SARS-CoV-2 by FDA under an Emergency Use Authorization (EUA). This EUA will remain in effect (meaning this test can be used) for the duration of the COVID-19 declaration under Section 564(b)(1) of the Act, 21 U.S.C. section 360bbb-3(b)(1), unless the authorization is terminated or revoked.  Performed at [facility], [address], [place], [place] [address]       Radiology Studies: CT Head Wo Contrast  Result Date: 11/07/2020 CLINICAL DATA:  Mental status change EXAM: CT HEAD WITHOUT CONTRAST TECHNIQUE: Contiguous axial images were obtained from the base of the skull through the vertex without intravenous contrast. COMPARISON:  CT brain 06/20/2016 FINDINGS: Brain: No acute territorial infarction, hemorrhage or intracranial mass. Mild atrophy. Patchy white matter hypodensity consistent with chronic small vessel ischemic change. Stable ventricle size. Suspected chronic lacunar infarct within the left thalamus. Vascular: No hyperdense vessels.  Carotid vascular calcification Skull: Normal. Negative for fracture or focal lesion. Sinuses/Orbits: Moderate mucosal thickening in the maxillary and ethmoid sinuses Other: None IMPRESSION: 1. No CT evidence for acute intracranial abnormality. 2. Atrophy and mild chronic small vessel ischemic change of the white matter. Electronically Signed   By: Jasmine Pang M.D.   On: 11/07/2020 23:38   DG Chest Port 1 View  Result Date: 11/07/2020 CLINICAL DATA:  Possible sepsis EXAM: PORTABLE CHEST 1 VIEW COMPARISON:  08/20/2005 FINDINGS: No acute airspace disease or effusion. Borderline cardiac size. Aortic atherosclerosis. No pneumothorax. IMPRESSION: No active disease. Electronically Signed   By: Jasmine Pang M.D.   On: 11/07/2020 22:06       LOS: 1 day   Mahrukh Seguin  [facility] Pager on [email or web address]  11/08/2020, 9:23 AM

## 2020-11-08 NOTE — ED Notes (Signed)
Pt resting in bed , pt axox0

## 2020-11-08 NOTE — ED Notes (Signed)
Patient is resting comfortably. 

## 2020-11-08 NOTE — ED Notes (Signed)
Vital signs stable. 

## 2020-11-08 NOTE — Progress Notes (Signed)
PHARMACY - PHYSICIAN COMMUNICATION CRITICAL VALUE ALERT - BLOOD CULTURE IDENTIFICATION (BCID)  5/31: 1/4 (aerobic): Staphylococcal species   Name of physician (or Provider) Contacted: Dr. Georgiana Spinner  Changes to prescribed antibiotics required: No changes to current IV antibiotics If suspect contaminant. Recommend vancomycin if concern for infection.   Results for orders placed or performed during the hospital encounter of 11/07/20  Blood Culture (routine x 2)     Status: None (Preliminary result)   Collection Time: 11/07/20  9:55 PM   Specimen: BLOOD  Result Value Ref Range Status   Specimen Description BLOOD LEFT AC  Final   Special Requests   Final    BOTTLES DRAWN AEROBIC AND ANAEROBIC Blood Culture adequate volume   Culture  Setup Time   Final    Organism ID to follow GRAM POSITIVE COCCI AEROBIC BOTTLE ONLY CRITICAL RESULT CALLED TO, READ BACK BY AND VERIFIED WITHCloria [facility] AT 1510 11/08/2020 DLB Performed at [facility], [address] [place], [place] [address]    Culture GRAM POSITIVE COCCI  Final   Report Status PENDING  Incomplete  Blood Culture ID Panel (Reflexed)     Status: Abnormal   Collection Time: 11/07/20  9:55 PM  Result Value Ref Range Status   Enterococcus faecalis NOT DETECTED NOT DETECTED Final   Enterococcus Faecium NOT DETECTED NOT DETECTED Final   Listeria monocytogenes NOT DETECTED NOT DETECTED Final   Staphylococcus species DETECTED (A) NOT DETECTED Final    Comment: CRITICAL RESULT CALLED TO, READ BACK BY AND VERIFIED WITH: Valory Wetherby DORLEN AT 1510 11/08/2020 DLB    Staphylococcus aureus (BCID) NOT DETECTED NOT DETECTED Final   Staphylococcus epidermidis NOT DETECTED NOT DETECTED Final   Staphylococcus lugdunensis NOT DETECTED NOT DETECTED Final   Streptococcus species NOT DETECTED NOT DETECTED Final   Streptococcus agalactiae NOT DETECTED NOT DETECTED Final   Streptococcus pneumoniae NOT DETECTED NOT DETECTED Final   Streptococcus  pyogenes NOT DETECTED NOT DETECTED Final   A.calcoaceticus-baumannii NOT DETECTED NOT DETECTED Final   Bacteroides fragilis NOT DETECTED NOT DETECTED Final   Enterobacterales NOT DETECTED NOT DETECTED Final   Enterobacter cloacae complex NOT DETECTED NOT DETECTED Final   Escherichia coli NOT DETECTED NOT DETECTED Final   Klebsiella aerogenes NOT DETECTED NOT DETECTED Final   Klebsiella oxytoca NOT DETECTED NOT DETECTED Final   Klebsiella pneumoniae NOT DETECTED NOT DETECTED Final   Proteus species NOT DETECTED NOT DETECTED Final   Salmonella species NOT DETECTED NOT DETECTED Final   Serratia marcescens NOT DETECTED NOT DETECTED Final   Haemophilus influenzae NOT DETECTED NOT DETECTED Final   Neisseria meningitidis NOT DETECTED NOT DETECTED Final   Pseudomonas aeruginosa NOT DETECTED NOT DETECTED Final   Stenotrophomonas maltophilia NOT DETECTED NOT DETECTED Final   Candida albicans NOT DETECTED NOT DETECTED Final   Candida auris NOT DETECTED NOT DETECTED Final   Candida glabrata NOT DETECTED NOT DETECTED Final   Candida krusei NOT DETECTED NOT DETECTED Final   Candida parapsilosis NOT DETECTED NOT DETECTED Final   Candida tropicalis NOT DETECTED NOT DETECTED Final   Cryptococcus neoformans/gattii NOT DETECTED NOT DETECTED Final    Comment: Performed at [facility], [address] [place], [place] [address]  Resp Panel by RT-PCR (Flu A&B, Covid) Nasopharyngeal Swab     Status: None   Collection Time: 11/07/20 10:13 PM   Specimen: Nasopharyngeal Swab; Nasopharyngeal(NP) swabs in vial transport medium  Result Value Ref Range Status   SARS Coronavirus 2 by RT PCR NEGATIVE NEGATIVE  Final    Comment: (NOTE) SARS-CoV-2 target nucleic acids are NOT DETECTED.  The SARS-CoV-2 RNA is generally detectable in upper respiratory specimens during the acute phase of infection. The lowest concentration of SARS-CoV-2 viral copies this assay can detect is 138 copies/mL. A negative result  does not preclude SARS-Cov-2 infection and should not be used as the sole basis for treatment or other patient management decisions. A negative result may occur with  improper specimen collection/handling, submission of specimen other than nasopharyngeal swab, presence of viral mutation(s) within the areas targeted by this assay, and inadequate number of viral copies(<138 copies/mL). A negative result must be combined with clinical observations, patient history, and epidemiological information. The expected result is Negative.  Fact Sheet for Patients:  BloggerCourse.com  Fact Sheet for Healthcare Providers:  SeriousBroker.it  This test is no t yet approved or cleared by the [place] FDA and  has been authorized for detection and/or diagnosis of SARS-CoV-2 by FDA under an Emergency Use Authorization (EUA). This EUA will remain  in effect (meaning this test can be used) for the duration of the COVID-19 declaration under Section 564(b)(1) of the Act, 21 U.S.C.section 360bbb-3(b)(1), unless the authorization is terminated  or revoked sooner.       Influenza A by PCR NEGATIVE NEGATIVE Final   Influenza B by PCR NEGATIVE NEGATIVE Final    Comment: (NOTE) The Xpert Xpress SARS-CoV-2/FLU/RSV plus assay is intended as an aid in the diagnosis of influenza from Nasopharyngeal swab specimens and should not be used as a sole basis for treatment. Nasal washings and aspirates are unacceptable for Xpert Xpress SARS-CoV-2/FLU/RSV testing.  Fact Sheet for Patients: BloggerCourse.com  Fact Sheet for Healthcare Providers: SeriousBroker.it  This test is not yet approved or cleared by the [place] FDA and has been authorized for detection and/or diagnosis of SARS-CoV-2 by FDA under an Emergency Use Authorization (EUA). This EUA will remain in effect (meaning this test can be used) for  the duration of the COVID-19 declaration under Section 564(b)(1) of the Act, 21 U.S.C. section 360bbb-3(b)(1), unless the authorization is terminated or revoked.  Performed at [facility], [address] [place], [place] [address]    Sharen Hones, PharmD, BCPS Clinical Pharmacist   11/08/2020  10:33 PM

## 2020-11-08 NOTE — ED Notes (Signed)
Dr. Para March notified that pt failed her stroke swallow screen, and that her PO meds have not been given to her.

## 2020-11-08 NOTE — ED Notes (Signed)
Pt resting in room

## 2020-11-08 NOTE — ED Notes (Signed)
Family provided with recliner at this time

## 2020-11-09 DIAGNOSIS — R6520 Severe sepsis without septic shock: Secondary | ICD-10-CM | POA: Diagnosis not present

## 2020-11-09 DIAGNOSIS — A419 Sepsis, unspecified organism: Secondary | ICD-10-CM | POA: Diagnosis not present

## 2020-11-09 LAB — URINE DRUG SCREEN, QUALITATIVE (ARMC ONLY)
Amphetamines, Ur Screen: NOT DETECTED
Barbiturates, Ur Screen: NOT DETECTED
Benzodiazepine, Ur Scrn: NOT DETECTED
Cannabinoid 50 Ng, Ur ~~LOC~~: NOT DETECTED
Cocaine Metabolite,Ur ~~LOC~~: NOT DETECTED
MDMA (Ecstasy)Ur Screen: NOT DETECTED
Methadone Scn, Ur: NOT DETECTED
Opiate, Ur Screen: NOT DETECTED
Phencyclidine (PCP) Ur S: NOT DETECTED
Tricyclic, Ur Screen: POSITIVE — AB

## 2020-11-09 LAB — BASIC METABOLIC PANEL
Anion gap: 12 (ref 5–15)
BUN: 11 mg/dL (ref 8–23)
CO2: 25 mmol/L (ref 22–32)
Calcium: 9 mg/dL (ref 8.9–10.3)
Chloride: 100 mmol/L (ref 98–111)
Creatinine, Ser: 0.54 mg/dL (ref 0.44–1.00)
GFR, Estimated: >60 mL/min (ref >60)
Glucose, Bld: 121 mg/dL — ABNORMAL HIGH (ref 70–99)
Potassium: 3.3 mmol/L — ABNORMAL LOW (ref 3.5–5.1)
Sodium: 137 mmol/L (ref 135–145)

## 2020-11-09 LAB — MAGNESIUM: Magnesium: 1.9 mg/dL (ref 1.7–2.4)

## 2020-11-09 LAB — CBC
HCT: 40.9 % (ref 36.0–46.0)
Hemoglobin: 13.3 g/dL (ref 12.0–15.0)
MCH: 29.5 pg (ref 26.0–34.0)
MCHC: 32.5 g/dL (ref 30.0–36.0)
MCV: 90.7 fL (ref 80.0–100.0)
Platelets: 190 10*3/uL (ref 150–400)
RBC: 4.51 MIL/uL (ref 3.87–5.11)
RDW: 12.7 % (ref 11.5–15.5)
WBC: 4 10*3/uL (ref 4.0–10.5)
nRBC: 0 % (ref 0.0–0.2)

## 2020-11-09 LAB — LACTIC ACID, PLASMA: Lactic Acid, Venous: 1 mmol/L (ref 0.5–1.9)

## 2020-11-09 LAB — GLUCOSE, CAPILLARY
Glucose-Capillary: 132 mg/dL — ABNORMAL HIGH (ref 70–99)
Glucose-Capillary: 136 mg/dL — ABNORMAL HIGH (ref 70–99)
Glucose-Capillary: 187 mg/dL — ABNORMAL HIGH (ref 70–99)
Glucose-Capillary: 157 mg/dL — ABNORMAL HIGH (ref 70–99)

## 2020-11-09 MED ORDER — AMITRIPTYLINE HCL 25 MG PO TABS
25.0000 mg | ORAL_TABLET | Freq: Every day | ORAL | Status: DC
  Administered 2020-11-09: 25 mg via ORAL
  Filled 2020-11-09: qty 1

## 2020-11-09 MED ORDER — PRAVASTATIN SODIUM 40 MG PO TABS
40.0000 mg | ORAL_TABLET | Freq: Every day | ORAL | Status: DC
  Administered 2020-11-09 – 2020-11-10 (×2): 40 mg via ORAL
  Filled 2020-11-09 (×2): qty 1

## 2020-11-09 MED ORDER — POTASSIUM CHLORIDE CRYS ER 20 MEQ PO TBCR
40.0000 meq | EXTENDED_RELEASE_TABLET | Freq: Once | ORAL | Status: DC
  Filled 2020-11-09: qty 2

## 2020-11-09 MED ORDER — METOPROLOL TARTRATE 25 MG PO TABS
25.0000 mg | ORAL_TABLET | Freq: Two times a day (BID) | ORAL | Status: DC
  Administered 2020-11-09 – 2020-11-11 (×4): 25 mg via ORAL
  Filled 2020-11-09 (×5): qty 1

## 2020-11-09 NOTE — Progress Notes (Signed)
Received patient from ED. Upon assess, patient is drowsy, response to voice and only able to tell her name. Disoriented x3, unable to complete admission profiles at this moment. Will keep monitoring

## 2020-11-09 NOTE — Progress Notes (Signed)
[facility]  - [facility]   PATIENT NAME: Erica Mueller    MR#:  [id number]  DATE OF BIRTH:  1950-06-24  SUBJECTIVE:   Came in with altered mental status. She is afebrile. Granddaughter in the room. Patient waking up more and answering some questions. UA was noted to be abnormal. She does not give any history of urinary symptoms. She has history of schizophrenia. REVIEW OF SYSTEMS:   Review of Systems  Unable to perform ROS: Mental status change   Tolerating Diet: Tolerating PT:   DRUG ALLERGIES:  No Known Allergies  VITALS:  Blood pressure (!) 144/67, pulse (!) 106, temperature 99.6 F (37.6 C), temperature source Oral, resp. rate 16, height 5\' 2"  (1.575 m), weight 61.4 kg, SpO2 100 %.  PHYSICAL EXAMINATION:   Physical Exam  GENERAL:  70 y.o.-year-old patient lying in the bed with no acute distress.  LUNGS: Normal breath sounds bilaterally, no wheezing, rales, rhonchi. No use of accessory muscles of respiration.  CARDIOVASCULAR: S1, S2 normal. No murmurs, rubs, or gallops.  ABDOMEN: Soft, nontender, nondistended. Bowel sounds present. No organomegaly or mass.  EXTREMITIES: No cyanosis, clubbing or edema b/l.    NEUROLOGIC: nonfocal  PSYCHIATRIC:  patient is alert SKIN: No obvious rash, lesion, or ulcer.   LABORATORY PANEL:  CBC Recent Labs  Lab 11/09/20 0632  WBC 4.0  HGB 13.3  HCT 40.9  PLT 190    Chemistries  Recent Labs  Lab 11/07/20 2155 11/08/20 0716 11/09/20 0632  NA 136   < > 137  K 3.8   < > 3.3*  CL 101   < > 100  CO2 22   < > 25  GLUCOSE 260*   < > 121*  BUN 15   < > 11  CREATININE 0.75   < > 0.54  CALCIUM 9.0   < > 9.0  MG 1.7  --  1.9  AST 31  --   --   ALT 16  --   --   ALKPHOS 40  --   --   BILITOT 0.6  --   --    < > = values in this interval not displayed.   Cardiac Enzymes No results for input(s): TROPONINI in the last 168 hours. RADIOLOGY:  CT Head Wo Contrast  Result Date: 11/07/2020 CLINICAL DATA:   Mental status change EXAM: CT HEAD WITHOUT CONTRAST TECHNIQUE: Contiguous axial images were obtained from the base of the skull through the vertex without intravenous contrast. COMPARISON:  CT brain 06/20/2016 FINDINGS: Brain: No acute territorial infarction, hemorrhage or intracranial mass. Mild atrophy. Patchy white matter hypodensity consistent with chronic small vessel ischemic change. Stable ventricle size. Suspected chronic lacunar infarct within the left thalamus. Vascular: No hyperdense vessels.  Carotid vascular calcification Skull: Normal. Negative for fracture or focal lesion. Sinuses/Orbits: Moderate mucosal thickening in the maxillary and ethmoid sinuses Other: None IMPRESSION: 1. No CT evidence for acute intracranial abnormality. 2. Atrophy and mild chronic small vessel ischemic change of the white matter. Electronically Signed   By: 08/18/2016 M.D.   On: 11/07/2020 23:38   DG Chest Port 1 View  Result Date: 11/07/2020 CLINICAL DATA:  Possible sepsis EXAM: PORTABLE CHEST 1 VIEW COMPARISON:  08/20/2005 FINDINGS: No acute airspace disease or effusion. Borderline cardiac size. Aortic atherosclerosis. No pneumothorax. IMPRESSION: No active disease. Electronically Signed   By: 10/20/2005 M.D.   On: 11/07/2020 22:06   ASSESSMENT AND PLAN:   Urinary tract infection  with severe sepsis --She was noted to be febrile with tachycardia and tachypnea and altered mental status.   --WBC is normal however lactic acid level was noted to be elevated.  -- UA was grossly abnormal. Urine culture growing more than hundred thousand gram-negative rods -- Continue with ceftriaxone.  Acute metabolic encephalopathy Likely secondary to sepsis.  -- CT head did not show any acute intracranial findings.  Does not seem to be agitated or delirious this morning.  No focal neurological deficits noted. -- Continue to monitor. -- To sleep  Hypokalemia Will be repleted  Diabetes mellitus type 2 uncontrolled  with hyperglycemia Continue CBGs and SSI.  Check HbA1c.  Noted to be on metformin at home.  History of essential hypertension Monitor blood pressures.  History of schizophrenia Resume home medications.  DVT Prophylaxis: Lovenox Code Status: Full code Family Communication: Discussed with granddaughter at bedside disposition Plan: Hopefully return home in improved. Pending PT OT  Status is: Inpatient  Remains inpatient appropriate because:Altered mental status and IV treatments appropriate due to intensity of illness or inability to take PO   Dispo: The patient is from: Home  Anticipated d/c is to:  likely Home  Patient currently is not medically stable to d/c.              Difficult to place patient No       TOTAL TIME TAKING CARE OF THIS PATIENT: *30* minutes.  >50% time spent on counselling and coordination of care  Note: This dictation was prepared with Dragon dictation along with smaller phrase technology. Any transcriptional errors that result from this process are unintentional.  [name] M.D    [facility]   CC: Primary care physician; [facility]Patient ID: Sherilyn Banker, female   DOB: May 02, 1951, 70 y.o.   MRN: [id number]

## 2020-11-09 NOTE — Evaluation (Signed)
Physical Therapy Evaluation Patient Details Name: Erica Mueller MRN: [id number] DOB: [birth year] Today's Date: 11/09/2020   History of Present Illness  70 y.o. female with medical history significant for HTN, DM, schizophrenia, depression, brought to the emergency room after she slid out of bed and was noted to be altered.  Imaging reveals no acute intracranial issues.  Clinical Impression  Pt with very limited ability to functionally participate.  We did do ~10 minutes of bed mobility/sitting EOB activity with inconsistent ability to use R UE to assist EOB balance, but ultimately very lethargic, unable to follow instructions/communicate and overall she showed functional level far from her baseline.  Family hopes to take her home but she will need to show significant improvement in order for this to be a safe option.      Follow Up Recommendations SNF;Supervision/Assistance - 24 hour    Equipment Recommendations   (TBD per progress)    Recommendations for Other Services       Precautions / Restrictions Precautions Precautions: Fall Restrictions Weight Bearing Restrictions: No      Mobility  Bed Mobility Overal bed mobility: Needs Assistance Bed Mobility: Sidelying to Sit;Supine to Sit   Sidelying to sit: Max assist Supine to sit: Max assist     General bed mobility comments: Pt unable to maintain sitting balance w/o heavy assist. When PT was able to get her to hold foot board with L hand she did briefly maintain sitting with only CGA, but w/o L UE she leaned heavily to the R and despite heavy verbal and tactile cues could not make the appropriate adjustments    Transfers                 General transfer comment: unable/unsafe  Ambulation/Gait                Stairs            Wheelchair Mobility    Modified Rankin (Stroke Patients Only)       Balance Overall balance assessment: Needs assistance Sitting-balance support: Bilateral upper extremity  supported Sitting balance-Leahy Scale: Zero Sitting balance - Comments: leaning heavily to the R, unable to use R UE effectively to take weight/assist                                     Pertinent Vitals/Pain Pain Assessment:  (does not appear to be in pain with activity, unable to answer)    Home Living Family/patient expects to be discharged to:: Unsure                 Additional Comments: granddaughter hoping to take her home, however understanding that per progress this may not be possible    Prior Function Level of Independence: Independent         Comments: apparently she was able to ambulate w/o AD ad lib, would run errands with granddaughter, could dress, do light chores, bathe, etc w/o assist     Hand Dominance        Extremity/Trunk Assessment   Upper Extremity Assessment Upper Extremity Assessment: Difficult to assess due to impaired cognition;Generalized weakness (L appears to be stronger with greater control than R)    Lower Extremity Assessment Lower Extremity Assessment: Difficult to assess due to impaired cognition;Generalized weakness (L appears to be stronger with greater control than R)       Communication   Communication: Expressive  difficulties;Receptive difficulties (pt mostly grunting, saying "yeah" or inconsistently echoing single words)  Cognition Arousal/Alertness: Lethargic Behavior During Therapy: Flat affect Overall Cognitive Status: Impaired/Different from baseline                                 General Comments: profoundly limited and different from baseline, able to give some effort with cuing but far from baseline and struggled to keep eyes open much less interact      General Comments General comments (skin integrity, edema, etc.): Pt with limited active participation, able to open eyes occasionally but then only briefly and w/o true awareness    Exercises     Assessment/Plan    PT  Assessment Patient needs continued PT services  PT Problem List Decreased strength;Decreased range of motion;Decreased activity tolerance;Decreased balance;Decreased mobility;Decreased coordination;Decreased cognition;Decreased knowledge of use of DME;Decreased safety awareness       PT Treatment Interventions DME instruction;Gait training;Stair training;Functional mobility training;Therapeutic activities;Therapeutic exercise;Balance training;Neuromuscular re-education;Patient/family education;Cognitive remediation    PT Goals (Current goals can be found in the Care Plan section)  Acute Rehab PT Goals Patient Stated Goal: per family hoping to return hom PT Goal Formulation: With family Time For Goal Achievement: 11/23/20 Potential to Achieve Goals: Fair    Frequency Min 2X/week   Barriers to discharge        Co-evaluation               AM-PAC PT "6 Clicks" Mobility  Outcome Measure Help needed turning from your back to your side while in a flat bed without using bedrails?: Total Help needed moving from lying on your back to sitting on the side of a flat bed without using bedrails?: Total Help needed moving to and from a bed to a chair (including a wheelchair)?: Total Help needed standing up from a chair using your arms (e.g., wheelchair or bedside chair)?: Total Help needed to walk in hospital room?: Total Help needed climbing 3-5 steps with a railing? : Total 6 Click Score: 6    End of Session   Activity Tolerance: Patient limited by lethargy Patient left: with bed alarm set;with call bell/phone within reach;with family/visitor present Nurse Communication: Mobility status PT Visit Diagnosis: Muscle weakness (generalized) (M62.81);Difficulty in walking, not elsewhere classified (R26.2)    Time: 1510-1530 PT Time Calculation (min) (ACUTE ONLY): 20 min   Charges:   PT Evaluation $PT Eval Low Complexity: 1 Low PT Treatments $Therapeutic Activity: 8-22 mins         Malachi Pro, DPT 11/09/2020, 5:23 PM

## 2020-11-09 NOTE — Progress Notes (Signed)
   11/09/20 1946  Assess: MEWS Score  Temp (!) 102.9 F (39.4 C)  BP (!) 158/78  Pulse Rate (!) 116  Resp 17  Level of Consciousness Responds to Voice  SpO2 99 %  O2 Device Room Air  Patient Activity (if Appropriate) In bed  Assess: MEWS Score  MEWS Temp 2  MEWS Systolic 0  MEWS Pulse 2  MEWS RR 0  MEWS LOC 1  MEWS Score 5  MEWS Score Color Red  Assess: if the MEWS score is Yellow or Red  Were vital signs taken at a resting state? Yes  Focused Assessment No change from prior assessment  Early Detection of Sepsis Score *See Row Information* High  MEWS guidelines implemented *See Row Information* Yes  Treat  MEWS Interventions Administered prn meds/treatments  Pain Scale 0-10  Pain Score 0  Take Vital Signs  Increase Vital Sign Frequency  Red: Q 1hr X 4 then Q 4hr X 4, if remains red, continue Q 4hrs  Escalate  MEWS: Escalate Red: discuss with charge nurse/RN and provider, consider discussing with RRT  Notify: Charge Nurse/RN  Name of Charge Nurse/RN Notified Mardella Layman RN  Date Charge Nurse/RN Notified 11/09/20  Time Charge Nurse/RN Notified 2015  Notify: Provider  Provider Name/Title Girguis MD  Date Provider Notified 11/09/20  Time Provider Notified 2015  Notification Type  (secure chat)  Notification Reason Change in status  Provider response No new orders;Other (Comment) (informed of treatment and told to monitor)  Date of Provider Response 11/09/20  Time of Provider Response 2018  Document  Patient Outcome Stabilized after interventions  Progress note created (see row info) Yes

## 2020-11-09 NOTE — Progress Notes (Signed)
   11/09/20 2108  Assess: MEWS Score  Temp 100.3 F (37.9 C)  BP 134/66  Pulse Rate 86  Resp 18  SpO2 100 %  Assess: MEWS Score  MEWS Temp 0  MEWS Systolic 0  MEWS Pulse 0  MEWS RR 0  MEWS LOC 1  MEWS Score 1  MEWS Score Color Erica Mueller

## 2020-11-09 NOTE — Progress Notes (Signed)
Pt drowsy upon assessment this am. Wakes up easily. Oriented only to self. Pt will follow commands but is refusing anything to eat or drink. Will continue to monitor.

## 2020-11-09 NOTE — Progress Notes (Signed)
Pt has been lethargic a good part of the day however she was more alert and interactive while her granddaughter was here. Grand daughter was able to get pt to eat some lunch and a little bit of her dinner. Pt was able to swallow pills fine but had to be encouraged to do so. Grand daughter currently at bedside. No BM this shift.

## 2020-11-10 DIAGNOSIS — R6520 Severe sepsis without septic shock: Secondary | ICD-10-CM | POA: Diagnosis not present

## 2020-11-10 DIAGNOSIS — A419 Sepsis, unspecified organism: Secondary | ICD-10-CM | POA: Diagnosis not present

## 2020-11-10 LAB — CULTURE, BLOOD (ROUTINE X 2): Special Requests: ADEQUATE

## 2020-11-10 LAB — URINE CULTURE: Culture: >=100000 — AB

## 2020-11-10 LAB — GLUCOSE, CAPILLARY
Glucose-Capillary: 157 mg/dL — ABNORMAL HIGH (ref 70–99)
Glucose-Capillary: 150 mg/dL — ABNORMAL HIGH (ref 70–99)
Glucose-Capillary: 198 mg/dL — ABNORMAL HIGH (ref 70–99)
Glucose-Capillary: 129 mg/dL — ABNORMAL HIGH (ref 70–99)

## 2020-11-10 MED ORDER — POTASSIUM CHLORIDE 20 MEQ PO PACK
40.0000 meq | PACK | Freq: Once | ORAL | Status: AC
  Administered 2020-11-10: 40 meq via ORAL
  Filled 2020-11-10: qty 2

## 2020-11-10 MED ORDER — CEPHALEXIN 250 MG PO CAPS
250.0000 mg | ORAL_CAPSULE | Freq: Four times a day (QID) | ORAL | Status: DC
  Administered 2020-11-11 (×2): 250 mg via ORAL
  Filled 2020-11-10 (×5): qty 1

## 2020-11-10 NOTE — TOC Initial Note (Signed)
Transition of Care [facility]) - Initial/Assessment Note    Patient Details  Name: Erica Mueller MRN: [id number] Date of Birth: Feb 28, 1951  Transition of Care [facility]) CM/SW Contact:    Gildardo Griffes, LCSW Phone Number: 11/10/2020, 8:56 AM  Clinical Narrative:                  CSW spoke with patient's brother Allyson Sabal regarding PT recommendation of SNF at time of discharge.   Allyson Sabal reports he's in agreement at this time for patient to go to SNF for short term rehab. He reports prior to hospital admission patient was living at home with granddaughter and grandson caring for her.   Reports preference to keep SNF around [place] area. Agreeable for CSW to fax out bed offers.   Pending bed offer at this time.   Expected Discharge Plan: Skilled Nursing Facility Barriers to Discharge: Continued Medical Work up   Patient Goals and CMS Choice   CMS Medicare.gov Compare Post Acute Care list provided to:: Patient Represenative (must comment) (brother Allyson Sabal) Choice offered to / list presented to : Sibling  Expected Discharge Plan and Services Expected Discharge Plan: Skilled Nursing Facility     Post Acute Care Choice: Skilled Nursing Facility Living arrangements for the past 2 months: Single Family Home                                      Prior Living Arrangements/Services Living arrangements for the past 2 months: Single Family Home Lives with:: Relatives Patient language and need for interpreter reviewed:: Yes Do you feel safe going back to the place where you live?: No   needs short term rehab  Need for Family Participation in Patient Care: Yes (Comment) Care giver support system in place?: Yes (comment)   Criminal Activity/Legal Involvement Pertinent to Current Situation/Hospitalization: No - Comment as needed  Activities of Daily Living   ADL Screening (condition at time of admission) Patient's cognitive ability adequate to safely complete daily activities?: No Is the  patient deaf or have difficulty hearing?: No Does the patient have difficulty seeing, even when wearing glasses/contacts?: No Does the patient have difficulty concentrating, remembering, or making decisions?: Yes Patient able to express need for assistance with ADLs?: No Does the patient have difficulty dressing or bathing?: Yes Independently performs ADLs?: Yes (appropriate for developmental age) Does the patient have difficulty walking or climbing stairs?: Yes Weakness of Legs: Both Weakness of Arms/Hands: Both  Permission Sought/Granted Permission sought to share information with : Case Manager,Facility Contact Representative,Family Supports Permission granted to share information with : Yes, Verbal Permission Granted  Share Information with NAME: Allyson Sabal  Permission granted to share info w AGENCY: SNFs  Permission granted to share info w Relationship: brother  Permission granted to share info w Contact Information: [phone]  Emotional Assessment       Orientation: : Oriented to Self Alcohol / Substance Use: Not Applicable Psych Involvement: No (comment)  Admission diagnosis:  Acute cystitis without hematuria [N30.00] Severe sepsis (HCC) [A41.9, R65.20] Altered mental status, unspecified altered mental status type [R41.82] Sepsis without acute organ dysfunction, due to unspecified organism [facility]) [A41.9] Patient Active Problem List   Diagnosis Date Noted  . Acute metabolic encephalopathy 11/07/2020  . Severe sepsis (HCC) 11/07/2020  . UTI (urinary tract infection) 11/07/2020  . GERD (gastroesophageal reflux disease) 12/20/2017  . Hyperlipemia 12/20/2017  . History of schizophrenia 07/30/2017  .  Schizophrenia (HCC) 05/27/2017  . Diabetes (HCC) 04/02/2017  . Hypertension 04/02/2017  . Noncompliance 04/02/2017  . Depression 11/17/2013   PCP:  [facility] Pharmacy:   MEDICAL [address] [place], [place] - [address] [place]  [address] Phone: [phone] Fax: [phone]  [id number], [place] - [address] [facility] [address] [facility] [place] [address] Phone: [phone] Fax: [phone]  [facility] [place], [place] - [address] [place] [address] Phone: [phone] Fax: [phone]     Social Determinants of Health (SDOH) Interventions    Readmission Risk Interventions No flowsheet data found.

## 2020-11-10 NOTE — NC FL2 (Signed)
MEDICAID FL2 LEVEL OF CARE SCREENING TOOL     IDENTIFICATION  Patient Name: Erica Mueller Birthdate: 1950-08-08 Sex: female Admission Date (Current Location): 11/07/2020  [place] and [place] Number:  Chiropodist and Address:  [facility], [address] [place], [place] [address]      Provider Number: [id number]  Attending Physician Name and Address:  [name], MD  Relative Name and Phone Number:  Allyson Sabal (brother) [phone]    Current Level of Care: Hospital Recommended Level of Care: Skilled Nursing Facility Prior Approval Number:    Date Approved/Denied:   PASRR Number: [id number]  Discharge Plan: SNF    Current Diagnoses: Patient Active Problem List   Diagnosis Date Noted  . Acute metabolic encephalopathy 11/07/2020  . Severe sepsis (HCC) 11/07/2020  . UTI (urinary tract infection) 11/07/2020  . GERD (gastroesophageal reflux disease) 12/20/2017  . Hyperlipemia 12/20/2017  . History of schizophrenia 07/30/2017  . Schizophrenia (HCC) 05/27/2017  . Diabetes (HCC) 04/02/2017  . Hypertension 04/02/2017  . Noncompliance 04/02/2017  . Depression 11/17/2013    Orientation RESPIRATION BLADDER Height & Weight     Self  Normal Incontinent,External catheter Weight: 135 lb 5.8 oz (61.4 kg) Height:  5\' 2"  (157.5 cm)  BEHAVIORAL SYMPTOMS/MOOD NEUROLOGICAL BOWEL NUTRITION STATUS      Continent Diet (see discharge summary)  AMBULATORY STATUS COMMUNICATION OF NEEDS Skin   Limited Assist Verbally Normal                       Personal Care Assistance Level of Assistance  Bathing,Feeding,Dressing,Total care Bathing Assistance: Limited assistance Feeding assistance: Limited assistance Dressing Assistance: Limited assistance Total Care Assistance: Limited assistance   Functional Limitations Info  Sight,Hearing,Speech Sight Info: Adequate Hearing Info: Adequate Speech Info: Adequate    SPECIAL CARE FACTORS  FREQUENCY  PT (By licensed PT),OT (By licensed OT)     PT Frequency: min 4x weekly OT Frequency: min 4x weekly            Contractures Contractures Info: Not present    Additional Factors Info  Code Status,Allergies Code Status Info: full Allergies Info: no known allergies           Current Medications (11/10/2020):  This is the current hospital active medication list Current Facility-Administered Medications  Medication Dose Route Frequency Provider Last Rate Last Admin  . acetaminophen (TYLENOL) tablet 650 mg  650 mg Oral Q6H PRN 01/10/2021, MD   650 mg at 11/10/20 0859   Or  . acetaminophen (TYLENOL) suppository 650 mg  650 mg Rectal Q6H PRN 01/10/21, MD      . amitriptyline (ELAVIL) tablet 25 mg  25 mg Oral QHS Andris Baumann, MD   25 mg at 11/09/20 2155  . cefTRIAXone (ROCEPHIN) 1 g in sodium chloride 0.9 % 100 mL IVPB  1 g Intravenous Q24H 2156 V, MD 200 mL/hr at 11/10/20 0500 1 g at 11/10/20 0500  . chlorproMAZINE (THORAZINE) tablet 25 mg  25 mg Oral BID 01/10/21, MD   25 mg at 11/10/20 0859  . enoxaparin (LOVENOX) injection 40 mg  40 mg Subcutaneous Q24H 01/10/21, MD   40 mg at 11/10/20 0751  . insulin aspart (novoLOG) injection 0-15 Units  0-15 Units Subcutaneous TID WC 08-10-1984, MD   3 Units at 11/09/20 1730  . insulin aspart (novoLOG) injection 0-5 Units  0-5 Units Subcutaneous QHS 01/09/21, MD      .  metoprolol tartrate (LOPRESSOR) tablet 25 mg  25 mg Oral BID [name], MD   25 mg at 11/10/20 0859  . ondansetron (ZOFRAN) tablet 4 mg  4 mg Oral Q6H PRN Andris Baumann, MD       Or  . ondansetron [facility]) injection 4 mg  4 mg Intravenous Q6H PRN Andris Baumann, MD      . paliperidone (INVEGA) 24 hr tablet 6 mg  6 mg Oral Daily Osvaldo Shipper, MD   6 mg at 11/10/20 0857  . pravastatin (PRAVACHOL) tablet 40 mg  40 mg Oral q1800 [name], MD   40 mg at 11/09/20 1730  . senna-docusate (Senokot-S) tablet 1 tablet  1  tablet Oral QHS PRN Andris Baumann, MD      . thiothixene (NAVANE) capsule 2 mg  2 mg Oral BID Osvaldo Shipper, MD   2 mg at 11/10/20 0900     Discharge Medications: Please see discharge summary for a list of discharge medications.  Relevant Imaging Results:  Relevant Lab Results:   Additional Information SSN: [id number]  Gildardo Griffes, LCSW

## 2020-11-10 NOTE — Care Management Important Message (Signed)
Important Message  Patient Details  Name: Erica Mueller MRN: [id number] Date of Birth: Dec 03, 1950   Medicare Important Message Given:  Yes     Johnell Comings 11/10/2020, 1:27 PM

## 2020-11-10 NOTE — Progress Notes (Signed)
Pt continues to be very lethargic.With much encouragement and stimulation patient ate half of her lunch. MD is aware. Will continue to monitor.

## 2020-11-10 NOTE — Progress Notes (Signed)
PT Cancellation Note  Patient Details Name: Erica Mueller MRN: [id number] DOB: Mar 10, 1951   Cancelled Treatment:    Reason Eval/Treat Not Completed: Patient's level of consciousness Spoke with nurse who reports that pt remains very lethargic and struggles to follow commands.  Not able to show much participation, suggests holding PT and checking back tomorrow.  Session deferred this date.   Malachi Pro, DPT 11/10/2020, 5:35 PM

## 2020-11-10 NOTE — Progress Notes (Signed)
[facility]  - [place] [facility]   PATIENT NAME: Erica Mueller    MR#:  [id number]  DATE OF BIRTH:  [birth year]  SUBJECTIVE:   Came in with altered mental status. She is afebrile.  Patient waking up more and answering some questions.  REVIEW OF SYSTEMS:   Review of Systems  Unable to perform ROS: Mental status change   Tolerating Diet:yes Tolerating PT: rehab  DRUG ALLERGIES:  No Known Allergies  VITALS:  Blood pressure 120/62, pulse 83, temperature 99.9 F (37.7 C), temperature source Oral, resp. rate 19, height 5\' 2"  (1.575 m), weight 61.4 kg, SpO2 99 %.  PHYSICAL EXAMINATION:   Physical Exam  GENERAL:  70 y.o.-year-old patient lying in the bed with no acute distress.  LUNGS: Normal breath sounds bilaterally, no wheezing, rales, rhonchi. No use of accessory muscles of respiration.  CARDIOVASCULAR: S1, S2 normal. No murmurs, rubs, or gallops.  ABDOMEN: Soft, nontender, nondistended. Bowel sounds present. No organomegaly or mass.  EXTREMITIES: No cyanosis, clubbing or edema b/l.    NEUROLOGIC: nonfocal  PSYCHIATRIC:  patient is alert SKIN: No obvious rash, lesion, or ulcer.   LABORATORY PANEL:  CBC Recent Labs  Lab 11/09/20 0632  WBC 4.0  HGB 13.3  HCT 40.9  PLT 190    Chemistries  Recent Labs  Lab 11/07/20 2155 11/08/20 0716 11/09/20 0632  NA 136   < > 137  K 3.8   < > 3.3*  CL 101   < > 100  CO2 22   < > 25  GLUCOSE 260*   < > 121*  BUN 15   < > 11  CREATININE 0.75   < > 0.54  CALCIUM 9.0   < > 9.0  MG 1.7  --  1.9  AST 31  --   --   ALT 16  --   --   ALKPHOS 40  --   --   BILITOT 0.6  --   --    < > = values in this interval not displayed.   Cardiac Enzymes No results for input(s): TROPONINI in the last 168 hours. RADIOLOGY:  No results found. ASSESSMENT AND PLAN:   Urinary tract infection with severe sepsis --She was noted to be febrile with tachycardia and tachypnea and altered mental status.   --WBC is normal however  lactic acid level was noted to be elevated.  -- UA was grossly abnormal. Urine culture growing more than hundred thousand gram-negative rods-- E. coli -- Continue with ceftriaxone-- change to oral Keflex -- blood culture one out of four appears contamination  Acute metabolic encephalopathy Likely secondary to sepsis.  -- CT head did not show any acute intracranial findings.  Does not seem to be agitated or delirious this morning.  No focal neurological deficits noted. - --patient more awake  Hypokalemia received potassium  Diabetes mellitus type 2 uncontrolled with hyperglycemia Continue CBGs and SSI.    HbA1c-- 6.7   Noted to be on metformin at home.  History of essential hypertension continue metoprolol  History of schizophrenia Resume home medications.  DVT Prophylaxis: Lovenox Code Status: Full code Family Communication: Discussed with granddaughter at bedside 6/1  disposition Plan: rehab Status is: Inpatient    Dispo: The patient is from: Home  Anticipated d/c is to:  rehab once bed opens up  Patient currently is  medically stable to d/c-- awaiting rehab bed              Difficult to  place patient No       TOTAL TIME TAKING CARE OF THIS PATIENT: 25* minutes.  >50% time spent on counselling and coordination of care  Note: This dictation was prepared with Dragon dictation along with smaller phrase technology. Any transcriptional errors that result from this process are unintentional.  [name] M.D    [facility]   CC: [facility]; [facility], [facility]   DOB: 01-13-1951, 70 y.o.   MRN: [id number]

## 2020-11-10 NOTE — Progress Notes (Signed)
Pt more alert and interactive this afternoon. Was able to feed herself a few bites of food and also ate some with assistance. Vital signs stable. Grand daughter at bedside. Will continue to monitor.

## 2020-11-10 NOTE — Progress Notes (Signed)
   11/10/20 1612  Assess: MEWS Score  Temp 100.1 F (37.8 C)  BP 116/61  Pulse Rate 93  Resp (!) 26  Level of Consciousness Responds to Voice  SpO2 96 %  O2 Device Room Air  Assess: MEWS Score  MEWS Temp 0  MEWS Systolic 0  MEWS Pulse 0  MEWS RR 2  MEWS LOC 1  MEWS Score 3  MEWS Score Color Yellow  Assess: if the MEWS score is Yellow or Red  Were vital signs taken at a resting state? Yes  Focused Assessment No change from prior assessment  Early Detection of Sepsis Score *See Row Information* High  MEWS guidelines implemented *See Row Information* Yes  Treat  MEWS Interventions Administered scheduled meds/treatments  Take Vital Signs  Increase Vital Sign Frequency  Yellow: Q 2hr X 2 then Q 4hr X 2, if remains yellow, continue Q 4hrs

## 2020-11-11 DIAGNOSIS — Z743 Need for continuous supervision: Secondary | ICD-10-CM | POA: Diagnosis not present

## 2020-11-11 DIAGNOSIS — I10 Essential (primary) hypertension: Secondary | ICD-10-CM | POA: Diagnosis not present

## 2020-11-11 DIAGNOSIS — R6520 Severe sepsis without septic shock: Secondary | ICD-10-CM | POA: Diagnosis not present

## 2020-11-11 DIAGNOSIS — N390 Urinary tract infection, site not specified: Secondary | ICD-10-CM | POA: Diagnosis not present

## 2020-11-11 DIAGNOSIS — M6281 Muscle weakness (generalized): Secondary | ICD-10-CM | POA: Diagnosis not present

## 2020-11-11 DIAGNOSIS — R1312 Dysphagia, oropharyngeal phase: Secondary | ICD-10-CM | POA: Diagnosis not present

## 2020-11-11 DIAGNOSIS — G9341 Metabolic encephalopathy: Secondary | ICD-10-CM | POA: Diagnosis not present

## 2020-11-11 DIAGNOSIS — M2550 Pain in unspecified joint: Secondary | ICD-10-CM | POA: Diagnosis not present

## 2020-11-11 DIAGNOSIS — E785 Hyperlipidemia, unspecified: Secondary | ICD-10-CM | POA: Diagnosis not present

## 2020-11-11 DIAGNOSIS — E119 Type 2 diabetes mellitus without complications: Secondary | ICD-10-CM | POA: Diagnosis not present

## 2020-11-11 DIAGNOSIS — R6889 Other general symptoms and signs: Secondary | ICD-10-CM | POA: Diagnosis not present

## 2020-11-11 DIAGNOSIS — G3184 Mild cognitive impairment, so stated: Secondary | ICD-10-CM | POA: Diagnosis not present

## 2020-11-11 DIAGNOSIS — K219 Gastro-esophageal reflux disease without esophagitis: Secondary | ICD-10-CM | POA: Diagnosis not present

## 2020-11-11 DIAGNOSIS — Z7401 Bed confinement status: Secondary | ICD-10-CM | POA: Diagnosis not present

## 2020-11-11 DIAGNOSIS — A419 Sepsis, unspecified organism: Secondary | ICD-10-CM | POA: Diagnosis not present

## 2020-11-11 LAB — GLUCOSE, CAPILLARY
Glucose-Capillary: 156 mg/dL — ABNORMAL HIGH (ref 70–99)
Glucose-Capillary: 190 mg/dL — ABNORMAL HIGH (ref 70–99)
Glucose-Capillary: 151 mg/dL — ABNORMAL HIGH (ref 70–99)

## 2020-11-11 MED ORDER — SENNOSIDES-DOCUSATE SODIUM 8.6-50 MG PO TABS: 1.0000 | ORAL_TABLET | Freq: Every day | ORAL | 0 refills | Status: DC

## 2020-11-11 MED ORDER — CEPHALEXIN 250 MG PO CAPS: 250.0000 mg | ORAL_CAPSULE | Freq: Four times a day (QID) | ORAL | 0 refills | Status: AC

## 2020-11-11 MED ORDER — METOPROLOL TARTRATE 25 MG PO TABS: 25.0000 mg | ORAL_TABLET | Freq: Two times a day (BID) | ORAL | 0 refills | Status: DC

## 2020-11-11 NOTE — Progress Notes (Signed)
Report called to [facility] Care/Pt made aware. IV removed. EMS to transport pt to facility room 13A.

## 2020-11-11 NOTE — TOC Transition Note (Signed)
Transition of Care [facility]) - CM/SW Discharge Note   Patient Details  Name: Erica Mueller MRN: [id number] Date of Birth: [birth year]  Transition of Care [facility]) CM/SW Contact:  Gildardo Griffes, LCSW Phone Number: 11/11/2020, 1:27 PM   Clinical Narrative:     Patient will DC to: [facility] Anticipated DC date: 11/11/20 Family notified: Ramond Craver Transport by: Wendie Simmer  Per MD patient ready for DC to [facility]  . RN, patient, patient's family, and facility notified of DC. Discharge Summary sent to facility. RN given number for report  [phone] Room 13A. DC packet on chart. Ambulance transport requested for patient.  CSW signing off.  Angeline Slim, LCSW    Final next level of care: Skilled Nursing Facility Barriers to Discharge: No Barriers Identified   Patient Goals and CMS Choice Patient states their goals for this hospitalization and ongoing recovery are:: to go to SNF CMS Medicare.gov Compare Post Acute Care list provided to:: Patient Represenative (must comment) (brother Allyson Sabal) Choice offered to / list presented to : Sibling  Discharge Placement              Patient chooses bed at: [facility] Patient to be transferred to facility by: [facility] Name of family member notified: Ramond Craver Patient and family notified of of transfer: 11/11/20  Discharge Plan and Services     Post Acute Care Choice: Skilled Nursing Facility                               Social Determinants of Health (SDOH) Interventions     Readmission Risk Interventions No flowsheet data found.

## 2020-11-11 NOTE — TOC Progression Note (Signed)
Transition of Care [facility]) - Progression Note    Patient Details  Name: MERLEAN PIZZINI MRN: [id number] Date of Birth: [birth year]  Transition of Care [facility]) CM/SW Contact  Gildardo Griffes, [place] Phone Number: 11/11/2020, 9:56 AM  Clinical Narrative:     CSW spoke with patient's sister to provide bed offers, agreeable to [facility].   CSW has initiated [facility] insurance auth today and all clinicals faxed.   CSW has informed [facility] of bed choice.   Pending insurance auth at this time.    Expected Discharge Plan: Skilled Nursing Facility Barriers to Discharge: Continued Medical Work up  Expected Discharge Plan and Services Expected Discharge Plan: Skilled Nursing Facility     Post Acute Care Choice: Skilled Nursing Facility Living arrangements for the past 2 months: Single Family Home                                       Social Determinants of Health (SDOH) Interventions    Readmission Risk Interventions No flowsheet data found.

## 2020-11-11 NOTE — Discharge Summary (Signed)
[facility] [place] [facility]   PATIENT NAME: Erica Mueller    MR#:  [id number]  DATE OF BIRTH:  November 13, 1950  DATE OF ADMISSION:  11/07/2020 ADMITTING PHYSICIAN: Andris Baumann, MD  DATE OF DISCHARGE: 11/11/2020  PRIMARY CARE PHYSICIAN: [facility]    ADMISSION DIAGNOSIS:  Acute cystitis without hematuria [N30.00] Severe sepsis (HCC) [A41.9, R65.20] Altered mental status, unspecified altered mental status type [R41.82] Sepsis without acute organ dysfunction, due to unspecified organism (HCC) [A41.9]  DISCHARGE DIAGNOSIS:  sepsis due to E. coli UTI  SECONDARY DIAGNOSIS:   Past Medical History:  Diagnosis Date  . Coronary artery disease   . Depression   . Diabetes mellitus without complication (HCC)   . Elevated lipids   . GERD (gastroesophageal reflux disease)   . Hypertension   . Schizophrenia (HCC)   . Syphilis (acquired)     HOSPITAL COURSE:  70 y.o.femalewith medical history significant forHTN, DM, schizophrenia, depression, brought to the emergency room after she slid out of bed and was noted to be altered since then.   Patient was noted to be afebrile.  UA was noted to be abnormal   Urinary tract infection with severe sepsis--POA --She was noted to be febrile with tachycardia and tachypnea and altered mental status.  --WBC is normal however lactic acid level was noted to be elevated.  --UA was grossly abnormal. Urine culture growing more than hundred thousand gram-negative rods-- E. coli --Continue with ceftriaxone-- change to oral Keflex -- blood culture one out of four appears contamination --sepsis resolved  Acute metabolic encephalopathy Likely secondary to sepsis.  --CT head did not show any acute intracranial findings. Does not seem to be agitated or delirious this morning. No focal neurological deficits noted. - --patient more awake and appears at baseline. She is tolerating PO diet and feeding  herself  Hypokalemia received potassium  Diabetes mellitus type 2 uncontrolled with hyperglycemia Continue CBGs and SSI.   HbA1c-- 6.7  Noted to be on metformin and glimipride at home-- resume  History of essential hypertension continue metoprolol  History of schizophrenia Resume home medications.  DVT Prophylaxis:Lovenox Code Status:Full code Family Communication:Discussed with brother Susa Simmonds on the phone  disposition Plan:rehab Status is: Inpatient    Dispo: The patient is from:Home Anticipated d/c is to: rehab todaypatient currently is  medically stable to d/c Difficult to place patient No  CONSULTS OBTAINED:    DRUG ALLERGIES:  No Known Allergies  DISCHARGE MEDICATIONS:   Allergies as of 11/11/2020   No Known Allergies     Medication List    TAKE these medications   amitriptyline 25 MG tablet Commonly known as: ELAVIL Take 1 tablet (25 mg total) by mouth at bedtime.   cephALEXin 250 MG capsule Commonly known as: KEFLEX Take 1 capsule (250 mg total) by mouth every 6 (six) hours for 20 doses.   chlorproMAZINE 25 MG tablet Commonly known as: THORAZINE Take 25 mg by mouth 2 (two) times daily.   glimepiride 2 MG tablet Commonly known as: AMARYL Take 2 mg by mouth daily.   lovastatin 40 MG tablet Commonly known as: MEVACOR Take 1 tablet (40 mg total) by mouth at bedtime.   metFORMIN 1000 MG tablet Commonly known as: GLUCOPHAGE Take 1 tablet by mouth 2 (two) times daily.   metoprolol tartrate 25 MG tablet Commonly known as: LOPRESSOR Take 1 tablet (25 mg total) by mouth 2 (two) times daily.   paliperidone 6 MG 24 hr tablet Commonly  known as: INVEGA Take 6 mg by mouth daily.   senna-docusate 8.6-50 MG tablet Commonly known as: Senokot-S Take 1 tablet by mouth at bedtime.   thiothixene 2 MG capsule Commonly known as: NAVANE Take 1 capsule (2 mg total) by mouth 2 (two) times daily.        If you experience worsening of your admission symptoms, develop shortness of breath, life threatening emergency, suicidal or homicidal thoughts you must seek medical attention immediately by calling 911 or calling your MD immediately  if symptoms less severe.  You Must read complete instructions/literature along with all the possible adverse reactions/side effects for all the Medicines you take and that have been prescribed to you. Take any new Medicines after you have completely understood and accept all the possible adverse reactions/side effects.   Please note  You were cared for by a hospitalist during your hospital stay. If you have any questions about your discharge medications or the care you received while you were in the hospital after you are discharged, you can call the unit and asked to speak with the hospitalist on call if the hospitalist that took care of you is not available. Once you are discharged, your primary care physician will handle any further medical issues. Please note that NO REFILLS for any discharge medications will be authorized once you are discharged, as it is imperative that you return to your primary care physician (or establish a relationship with a primary care physician if you do not have one) for your aftercare needs so that they can reassess your need for medications and monitor your lab values. Today   SUBJECTIVE   No new complaints. Awake alert fed herself breakfast. Ate pretty much everything on the tray. No new complaints.  VITAL SIGNS:  Blood pressure 128/61, pulse 80, temperature 97.8 F (36.6 C), temperature source Oral, resp. rate 20, height 5\' 2"  (1.575 m), weight 61.4 kg, SpO2 100 %.  I/O:    Intake/Output Summary (Last 24 hours) at 11/11/2020 1302 Last data filed at 11/11/2020 0459 Gross per 24 hour  Intake --  Output 1001 ml  Net -1001 ml    PHYSICAL EXAMINATION:  GENERAL:  70 y.o.-year-old patient lying in the bed with no acute  distress.  LUNGS: Normal breath sounds bilaterally, no wheezing, rales,rhonchi or crepitation. No use of accessory muscles of respiration.  CARDIOVASCULAR: S1, S2 normal. No murmurs, rubs, or gallops.  ABDOMEN: Soft, non-tender, non-distended. Bowel sounds present. No organomegaly or mass.  EXTREMITIES: No pedal edema, cyanosis, or clubbing.  NEUROLOGIC: nonfocal PSYCHIATRIC: The patient is alert SKIN: No obvious rash, lesion, or ulcer.   DATA REVIEW:   CBC  Recent Labs  Lab 11/09/20 0632  WBC 4.0  HGB 13.3  HCT 40.9  PLT 190    Chemistries  Recent Labs  Lab 11/07/20 2155 11/08/20 0716 11/09/20 0632  NA 136   < > 137  K 3.8   < > 3.3*  CL 101   < > 100  CO2 22   < > 25  GLUCOSE 260*   < > 121*  BUN 15   < > 11  CREATININE 0.75   < > 0.54  CALCIUM 9.0   < > 9.0  MG 1.7  --  1.9  AST 31  --   --   ALT 16  --   --   ALKPHOS 40  --   --   BILITOT 0.6  --   --    < > =  values in this interval not displayed.    Microbiology Results   Recent Results (from the past 240 hour(s))  Blood Culture (routine x 2)     Status: Abnormal   Collection Time: 11/07/20  9:55 PM   Specimen: BLOOD  Result Value Ref Range Status   Specimen Description   Final    BLOOD LEFT AC Performed at [facility], [address] [place], [place] [address]    Special Requests   Final    BOTTLES DRAWN AEROBIC AND ANAEROBIC Blood Culture adequate volume Performed at [facility], [address] [place], [place] [address]    Culture  Setup Time   Final    GRAM POSITIVE COCCI AEROBIC BOTTLE ONLY CRITICAL RESULT CALLED TO, READ BACK BY AND VERIFIED WITH: CARISSA DORLEN AT 1510 11/08/2020 [facility]    Culture (A)  Final    STAPHYLOCOCCUS WARNERI THE SIGNIFICANCE OF ISOLATING THIS ORGANISM FROM A SINGLE VENIPUNCTURE CANNOT BE PREDICTED WITHOUT FURTHER CLINICAL AND CULTURE CORRELATION. SUSCEPTIBILITIES AVAILABLE ONLY ON REQUEST. Performed at [facility], [address]  [place], [place] [address]    Report Status 11/10/2020 FINAL  Final  Blood Culture ID Panel (Reflexed)     Status: Abnormal   Collection Time: 11/07/20  9:55 PM  Result Value Ref Range Status   Enterococcus faecalis NOT DETECTED NOT DETECTED Final   Enterococcus Faecium NOT DETECTED NOT DETECTED Final   Listeria monocytogenes NOT DETECTED NOT DETECTED Final   Staphylococcus species DETECTED (A) NOT DETECTED Final    Comment: CRITICAL RESULT CALLED TO, READ BACK BY AND VERIFIED WITH: CARISSA DORLEN AT 1510 11/08/2020 [facility]    Staphylococcus aureus (BCID) NOT DETECTED NOT DETECTED Final   Staphylococcus epidermidis NOT DETECTED NOT DETECTED Final   Staphylococcus lugdunensis NOT DETECTED NOT DETECTED Final   Streptococcus species NOT DETECTED NOT DETECTED Final   Streptococcus agalactiae NOT DETECTED NOT DETECTED Final   Streptococcus pneumoniae NOT DETECTED NOT DETECTED Final   Streptococcus pyogenes NOT DETECTED NOT DETECTED Final   A.calcoaceticus-baumannii NOT DETECTED NOT DETECTED Final   Bacteroides fragilis NOT DETECTED NOT DETECTED Final   Enterobacterales NOT DETECTED NOT DETECTED Final   Enterobacter cloacae complex NOT DETECTED NOT DETECTED Final   Escherichia coli NOT DETECTED NOT DETECTED Final   Klebsiella aerogenes NOT DETECTED NOT DETECTED Final   Klebsiella oxytoca NOT DETECTED NOT DETECTED Final   Klebsiella pneumoniae NOT DETECTED NOT DETECTED Final   Proteus species NOT DETECTED NOT DETECTED Final   Salmonella species NOT DETECTED NOT DETECTED Final   Serratia marcescens NOT DETECTED NOT DETECTED Final   Haemophilus influenzae NOT DETECTED NOT DETECTED Final   Neisseria meningitidis NOT DETECTED NOT DETECTED Final   Pseudomonas aeruginosa NOT DETECTED NOT DETECTED Final   Stenotrophomonas maltophilia NOT DETECTED NOT DETECTED Final   Candida albicans NOT DETECTED NOT DETECTED Final   Candida auris NOT DETECTED NOT DETECTED Final   Candida glabrata NOT DETECTED NOT  DETECTED Final   Candida krusei NOT DETECTED NOT DETECTED Final   Candida parapsilosis NOT DETECTED NOT DETECTED Final   Candida tropicalis NOT DETECTED NOT DETECTED Final   Cryptococcus neoformans/gattii NOT DETECTED NOT DETECTED Final    Comment: Performed at [facility], [address] [place], [place] [address]  Resp Panel by RT-PCR (Flu A&B, Covid) Nasopharyngeal Swab     Status: None   Collection Time: 11/07/20 10:13 PM   Specimen: Nasopharyngeal Swab; Nasopharyngeal(NP) swabs in vial transport medium  Result Value Ref  Range Status   SARS Coronavirus 2 by RT PCR NEGATIVE NEGATIVE Final    Comment: (NOTE) SARS-CoV-2 target nucleic acids are NOT DETECTED.  The SARS-CoV-2 RNA is generally detectable in upper respiratory specimens during the acute phase of infection. The lowest concentration of SARS-CoV-2 viral copies this assay can detect is 138 copies/mL. A negative result does not preclude SARS-Cov-2 infection and should not be used as the sole basis for treatment or other patient management decisions. A negative result may occur with  improper specimen collection/handling, submission of specimen other than nasopharyngeal swab, presence of viral mutation(s) within the areas targeted by this assay, and inadequate number of viral copies(<138 copies/mL). A negative result must be combined with clinical observations, patient history, and epidemiological information. The expected result is Negative.  Fact Sheet for Patients:  BloggerCourse.com  Fact Sheet for Healthcare Providers:  SeriousBroker.it  This test is no t yet approved or cleared by the [place] FDA and  has been authorized for detection and/or diagnosis of SARS-CoV-2 by FDA under an Emergency Use Authorization (EUA). This EUA will remain  in effect (meaning this test can be used) for the duration of the COVID-19 declaration under Section 564(b)(1) of  the Act, 21 U.S.C.section 360bbb-3(b)(1), unless the authorization is terminated  or revoked sooner.       Influenza A by PCR NEGATIVE NEGATIVE Final   Influenza B by PCR NEGATIVE NEGATIVE Final    Comment: (NOTE) The Xpert Xpress SARS-CoV-2/FLU/RSV plus assay is intended as an aid in the diagnosis of influenza from Nasopharyngeal swab specimens and should not be used as a sole basis for treatment. Nasal washings and aspirates are unacceptable for Xpert Xpress SARS-CoV-2/FLU/RSV testing.  Fact Sheet for Patients: BloggerCourse.com  Fact Sheet for Healthcare Providers: SeriousBroker.it  This test is not yet approved or cleared by the [place] FDA and has been authorized for detection and/or diagnosis of SARS-CoV-2 by FDA under an Emergency Use Authorization (EUA). This EUA will remain in effect (meaning this test can be used) for the duration of the COVID-19 declaration under Section 564(b)(1) of the Act, 21 U.S.C. section 360bbb-3(b)(1), unless the authorization is terminated or revoked.  Performed at [facility], [address] [place], [place] [address]   Urine culture     Status: Abnormal   Collection Time: 11/07/20 10:13 PM   Specimen: In/Out Cath Urine  Result Value Ref Range Status   Specimen Description   Final    IN/OUT CATH URINE Performed at [facility], [address] [place], [place] [address]    Special Requests   Final    NONE Performed at [facility], [address] [place], [place] [address]    Culture >=100,000 COLONIES/mL ESCHERICHIA COLI (A)  Final   Report Status 11/10/2020 FINAL  Final   Organism ID, Bacteria ESCHERICHIA COLI (A)  Final      Susceptibility   Escherichia coli - MIC*    AMPICILLIN 4 SENSITIVE Sensitive     CEFAZOLIN <=4 SENSITIVE Sensitive     CEFEPIME <=0.12 SENSITIVE Sensitive     CEFTRIAXONE <=0.25 SENSITIVE Sensitive     CIPROFLOXACIN  <=0.25 SENSITIVE Sensitive     GENTAMICIN <=1 SENSITIVE Sensitive     IMIPENEM <=0.25 SENSITIVE Sensitive     NITROFURANTOIN <=16 SENSITIVE Sensitive     TRIMETH/SULFA <=20 SENSITIVE Sensitive     AMPICILLIN/SULBACTAM <=2 SENSITIVE Sensitive     PIP/TAZO <=4 SENSITIVE Sensitive     * >=100,000 COLONIES/mL ESCHERICHIA  COLI    RADIOLOGY:  No results found.   CODE STATUS:     Code Status Orders  (From admission, onward)         Start     Ordered   11/07/20 2345  Full code  Continuous        11/07/20 2347        Code Status History    Date Active Date Inactive Code Status Order ID Comments User Context   10/16/2017 0053 10/24/2017 1726 Full Code [id number]  Audery Amel, MD Inpatient   05/27/2017 0024 05/30/2017 1929 Full Code [id number]  Clapacs, Jackquline Denmark, MD Inpatient   Advance Care Planning Activity       TOTAL TIME TAKING CARE OF THIS PATIENT: *35* minutes.    [name] M.D  [facility]    CC: Primary care physician; [facility]

## 2020-11-25 DIAGNOSIS — G9341 Metabolic encephalopathy: Secondary | ICD-10-CM | POA: Diagnosis not present

## 2020-11-25 DIAGNOSIS — G3184 Mild cognitive impairment, so stated: Secondary | ICD-10-CM | POA: Diagnosis not present

## 2020-12-14 ENCOUNTER — Emergency Department: Payer: Medicare Other

## 2020-12-14 ENCOUNTER — Other Ambulatory Visit: Payer: Self-pay

## 2020-12-14 ENCOUNTER — Encounter: Payer: Self-pay | Admitting: Emergency Medicine

## 2020-12-14 ENCOUNTER — Emergency Department
Admission: EM | Admit: 2020-12-14 | Discharge: 2020-12-14 | Disposition: A | Discharge status: Home / Self Care | Payer: Medicare Other | Attending: Emergency Medicine | Admitting: Emergency Medicine

## 2020-12-14 DIAGNOSIS — Z7401 Bed confinement status: Secondary | ICD-10-CM | POA: Diagnosis not present

## 2020-12-14 DIAGNOSIS — W19XXXA Unspecified fall, initial encounter: Secondary | ICD-10-CM | POA: Diagnosis not present

## 2020-12-14 DIAGNOSIS — S0990XA Unspecified injury of head, initial encounter: Secondary | ICD-10-CM | POA: Insufficient documentation

## 2020-12-14 DIAGNOSIS — F0390 Unspecified dementia without behavioral disturbance: Secondary | ICD-10-CM | POA: Diagnosis not present

## 2020-12-14 DIAGNOSIS — M542 Cervicalgia: Secondary | ICD-10-CM | POA: Diagnosis not present

## 2020-12-14 DIAGNOSIS — F17210 Nicotine dependence, cigarettes, uncomplicated: Secondary | ICD-10-CM | POA: Insufficient documentation

## 2020-12-14 DIAGNOSIS — R404 Transient alteration of awareness: Secondary | ICD-10-CM | POA: Diagnosis not present

## 2020-12-14 DIAGNOSIS — I10 Essential (primary) hypertension: Secondary | ICD-10-CM | POA: Diagnosis not present

## 2020-12-14 DIAGNOSIS — M436 Torticollis: Secondary | ICD-10-CM | POA: Diagnosis not present

## 2020-12-14 DIAGNOSIS — W08XXXA Fall from other furniture, initial encounter: Secondary | ICD-10-CM | POA: Diagnosis not present

## 2020-12-14 DIAGNOSIS — Z7984 Long term (current) use of oral hypoglycemic drugs: Secondary | ICD-10-CM | POA: Diagnosis not present

## 2020-12-14 DIAGNOSIS — I2510 Atherosclerotic heart disease of native coronary artery without angina pectoris: Secondary | ICD-10-CM | POA: Insufficient documentation

## 2020-12-14 DIAGNOSIS — E119 Type 2 diabetes mellitus without complications: Secondary | ICD-10-CM | POA: Insufficient documentation

## 2020-12-14 DIAGNOSIS — R739 Hyperglycemia, unspecified: Secondary | ICD-10-CM | POA: Diagnosis not present

## 2020-12-14 DIAGNOSIS — Z79899 Other long term (current) drug therapy: Secondary | ICD-10-CM | POA: Diagnosis not present

## 2020-12-14 DIAGNOSIS — W208XXA Other cause of strike by thrown, projected or falling object, initial encounter: Secondary | ICD-10-CM | POA: Insufficient documentation

## 2020-12-14 DIAGNOSIS — M89311 Hypertrophy of bone, right shoulder: Secondary | ICD-10-CM | POA: Diagnosis not present

## 2020-12-14 DIAGNOSIS — Y92009 Unspecified place in unspecified non-institutional (private) residence as the place of occurrence of the external cause: Secondary | ICD-10-CM | POA: Insufficient documentation

## 2020-12-14 DIAGNOSIS — R6889 Other general symptoms and signs: Secondary | ICD-10-CM | POA: Diagnosis not present

## 2020-12-14 DIAGNOSIS — Z043 Encounter for examination and observation following other accident: Secondary | ICD-10-CM | POA: Diagnosis not present

## 2020-12-14 DIAGNOSIS — I499 Cardiac arrhythmia, unspecified: Secondary | ICD-10-CM | POA: Diagnosis not present

## 2020-12-14 DIAGNOSIS — Z743 Need for continuous supervision: Secondary | ICD-10-CM | POA: Diagnosis not present

## 2020-12-14 DIAGNOSIS — M2550 Pain in unspecified joint: Secondary | ICD-10-CM | POA: Diagnosis not present

## 2020-12-14 NOTE — ED Provider Notes (Signed)
[facility] Emergency Department Provider Note  ____________________________________________   Event Date/Time   First MD Initiated Contact with Patient 12/14/20 1554     (approximate)  I have reviewed the triage vital signs and the nursing notes.   HISTORY  Chief Complaint Fall  History provided by EMS as the patient has dementia at baseline.  Patient's brother is at bedside, but has no interim history.  HPI Erica Mueller is a 70 y.o. female with below medical history including schizophrenia, dementia, CAD, hypertension, who presents to the ED from [facility], following an unwitnessed fall.  Patient apparently was trying to climb up on her freestanding closet system, that she pulled out a lower drawer.  As she did on a drawer, this friend chain it flipped over, trapping the patient in the knee.  According to the nurse, the doors of this chest swung open, and did not cause any visible injury to the patient's face.  The facility staff were able to lift the furniture off the patient and she was able to crawl out under her own power.  There was no blood at the scene, the patient initially complained of some neck pain, but is since not any ongoing pain.  She presents here for evaluation of her injuries.      Patient Active Problem List   Diagnosis Date Noted   Acute metabolic encephalopathy 11/07/2020   Severe sepsis (HCC) 11/07/2020   UTI (urinary tract infection) 11/07/2020   GERD (gastroesophageal reflux disease) 12/20/2017   Hyperlipemia 12/20/2017   History of schizophrenia 07/30/2017   Schizophrenia (HCC) 05/27/2017   Diabetes (HCC) 04/02/2017   Hypertension 04/02/2017   Noncompliance 04/02/2017   Depression 11/17/2013    Past Surgical History:  Procedure Laterality Date   COLONOSCOPY     COLONOSCOPY WITH PROPOFOL N/A 09/01/2015   Procedure: COLONOSCOPY WITH PROPOFOL;  Surgeon: Wallace Cullens, MD;  Location: [facility] ENDOSCOPY;  Service:  Gastroenterology;  Laterality: N/A;   TUBAL LIGATION     wisdom teeth removal      Prior to Admission medications   Medication Sig Start Date End Date Taking? Authorizing Provider  amitriptyline (ELAVIL) 25 MG tablet Take 1 tablet (25 mg total) by mouth at bedtime. 10/24/17   McNew, Ileene Hutchinson, MD  chlorproMAZINE (THORAZINE) 25 MG tablet Take 25 mg by mouth 2 (two) times daily. 09/05/20   [provider]  glimepiride (AMARYL) 2 MG tablet Take 2 mg by mouth daily. 09/01/20   [provider]  lovastatin (MEVACOR) 40 MG tablet Take 1 tablet (40 mg total) by mouth at bedtime. 12/20/17   Duanne Limerick, MD  metFORMIN (GLUCOPHAGE) 1000 MG tablet Take 1 tablet by mouth 2 (two) times daily. 09/05/20   [provider]  metoprolol tartrate (LOPRESSOR) 25 MG tablet Take 1 tablet (25 mg total) by mouth 2 (two) times daily. 11/11/20   [name], MD  paliperidone (INVEGA) 6 MG 24 hr tablet Take 6 mg by mouth daily. 09/05/20   [provider]  senna-docusate (SENOKOT-S) 8.6-50 MG tablet Take 1 tablet by mouth at bedtime. 11/11/20   [name], MD  thiothixene (NAVANE) 2 MG capsule Take 1 capsule (2 mg total) by mouth 2 (two) times daily. 10/24/17   McNew, Ileene Hutchinson, MD    Allergies Patient has no known allergies.  Family History  Problem Relation Age of Onset   Cancer Mother    Alcoholism Mother    Bone cancer Father  Heart disease Father    Diabetes Maternal Grandmother    Suicidality Paternal Uncle    Breast cancer Neg Hx     Social History Social History   Tobacco Use   Smoking status: Every Day    Packs/day: 1.00    Years: 48.00    Pack years: 48.00    Types: Cigarettes    Start date: 12/20/1969   Smokeless tobacco: Never  Vaping Use   Vaping Use: Never used  Substance Use Topics   Alcohol use: Yes    Alcohol/week: 3.0 standard drinks    Types: 2 Cans of beer, 1 Shots of liquor per week   Drug use: Not Currently    Types: Marijuana    Comment: PAST      Review of Systems  Constitutional: No fever/chills Eyes: No visual changes. ENT: No sore throat. Cardiovascular: Denies chest pain. Respiratory: Denies shortness of breath. Gastrointestinal: No abdominal pain.  No nausea, no vomiting.  No diarrhea.  No constipation. Genitourinary: Negative for dysuria. Musculoskeletal: Negative for back pain. Skin: Negative for rash. Neurological: Negative for headaches, focal weakness or numbness. ____________________________________________   PHYSICAL EXAM:  VITAL SIGNS: ED Triage Vitals [12/14/20 1434]  Enc Vitals Group     BP      Pulse      Resp      Temp      Temp src      SpO2      Weight 135 lb 5.8 oz (61.4 kg)     Height 5\' 2"  (1.575 m)     Head Circumference      Peak Flow      Pain Score 3     Pain Loc      Pain Edu?      Excl. in GC?     Constitutional: Alert and oriented. Well appearing and in no acute distress. Eyes: Conjunctivae are normal. PERRL. EOMI. Head: Atraumatic. Nose: No congestion/rhinnorhea. Mouth/Throat: Mucous membranes are moist.  Oropharynx non-erythematous. Neck: No stridor.   Cardiovascular: Normal rate, regular rhythm. Grossly normal heart sounds.  Good peripheral circulation. Respiratory: Normal respiratory effort.  No retractions. Lungs CTAB. Gastrointestinal: Soft and nontender. No distention. No abdominal bruits. No CVA tenderness. Musculoskeletal: No lower extremity tenderness nor edema.  No joint effusions. Neurologic:  Normal speech and language. No gross focal neurologic deficits are appreciated. No gait instability. Skin:  Skin is warm, dry and intact. No rash noted. Psychiatric: Mood and affect are normal. Speech and behavior are normal.  ____________________________________________   LABS (all labs ordered are listed, but only abnormal results are displayed)  Labs Reviewed - No data to  display ____________________________________________  EKG  ____________________________________________  RADIOLOGY I, , personally viewed and evaluated these images (plain radiographs) as part of my medical decision making, as well as reviewing the written report by the radiologist.  ED MD interpretation:  agree with report  Official radiology report(s):  CT Head / Maxillofacial / Cervical Spine w/o CM  IMPRESSION: 1. No acute posttraumatic findings identified within the head, face or cervical spine. 2. Chronic atrophy and chronic small vessel ischemic changes. 3. Mild inflammatory paranasal sinus disease. Severe mandibular periodontal disease and dental caries. 4. Multilevel cervical spondylosis.  ____________________________________________   PROCEDURES  Procedure(s) performed (including Critical Care):  Procedures  ____________________________________________   INITIAL IMPRESSION / ASSESSMENT AND PLAN / ED COURSE  As part of my medical decision making, I reviewed the following data within the electronic MEDICAL RECORD NUMBER History  obtained from family, Radiograph reviewed  WNL, and Notes from prior ED visits    Geriatric patient with dementia at baseline, presents to the ED for evaluation of injury sustained after furniture fell on top of her at her nursing facility.  The initial incident was unwitnessed, but staff was available to lift the closet system off the patient.  She is here for evaluation of injuries.  Patient is pleasant active, and otherwise without signs of acute distress.  CT scan of the face, head, neck all negative and reassuring.  Patient is stable at this time for discharge to her nursing facility.  She will follow with primary provider as discussed. ____________________________________________   FINAL CLINICAL IMPRESSION(S) / ED DIAGNOSES  Final diagnoses:  Fall in home, initial encounter  Minor head injury, initial encounter      ED Discharge Orders     None        Note:  This document was prepared using Dragon voice recognition software and may include unintentional dictation errors. q   Lissa Hoard, PA-C 12/15/20 [place] Melnick, MD 12/24/20 1615

## 2020-12-14 NOTE — ED Triage Notes (Signed)
Pt comes into the ED via EMS from [facility] with c/o fall, states she was climbing on the armoire and it fall back on top of her, pt only c/o is neck pain . Ambulated after the fall. Dementia hx, pt is alert on arrival  [id number] #[id number] 166/55 94HR 99%RA

## 2020-12-14 NOTE — ED Notes (Signed)
[facility]  CALLED FOR  TRANSPORT  TO  [facility] 

## 2020-12-14 NOTE — Discharge Instructions (Addendum)
Erica Mueller has a normal exam and imaging by CT did not reveal any acute injury to the head, face, or neck.  Continue to monitor for any changes and give Tylenol as needed for pain.  Follow-up with primary provider for ongoing symptoms.  Return to the ED as needed.

## 2020-12-19 DIAGNOSIS — R7309 Other abnormal glucose: Secondary | ICD-10-CM | POA: Diagnosis not present

## 2020-12-19 DIAGNOSIS — G3184 Mild cognitive impairment, so stated: Secondary | ICD-10-CM | POA: Diagnosis not present

## 2021-01-03 DIAGNOSIS — M6281 Muscle weakness (generalized): Secondary | ICD-10-CM | POA: Diagnosis not present

## 2021-01-03 DIAGNOSIS — I10 Essential (primary) hypertension: Secondary | ICD-10-CM | POA: Diagnosis not present

## 2021-01-03 DIAGNOSIS — E119 Type 2 diabetes mellitus without complications: Secondary | ICD-10-CM | POA: Diagnosis not present

## 2021-01-06 DIAGNOSIS — K5900 Constipation, unspecified: Secondary | ICD-10-CM | POA: Diagnosis not present

## 2021-01-06 DIAGNOSIS — R109 Unspecified abdominal pain: Secondary | ICD-10-CM | POA: Diagnosis not present

## 2021-01-06 DIAGNOSIS — R140 Abdominal distension (gaseous): Secondary | ICD-10-CM | POA: Diagnosis not present

## 2021-01-09 ENCOUNTER — Encounter: Payer: Self-pay | Admitting: Nurse Practitioner

## 2021-01-09 ENCOUNTER — Non-Acute Institutional Stay: Payer: Medicare Other | Admitting: Nurse Practitioner

## 2021-01-09 VITALS — BP 145/55 | HR 82 | Temp 97.0°F | Resp 18 | Wt 109.0 lb

## 2021-01-09 DIAGNOSIS — Z515 Encounter for palliative care: Secondary | ICD-10-CM

## 2021-01-09 DIAGNOSIS — R630 Anorexia: Secondary | ICD-10-CM

## 2021-01-09 NOTE — Progress Notes (Signed)
[facility] Consult Note Telephone: [phone]  Fax: [phone]   Date of encounter: 01/09/21 PATIENT NAME: Erica Mueller [address] Erica Erica Mueller [place] [address]   [phone] (home)  DOB: 08/17/1950 MRN: [id number] PRIMARY CARE PROVIDER:  Dr Lucianne Mueller [facility], [address],  [place] [address] [phone]  RESPONSIBLE PARTY:    Contact Information     Name Relation Home Work Erica Mueller   [phone]   Erica Mueller [phone]        I met face to face with patient in facility. Palliative Care was asked to follow this patient by consultation request of  Erica Erica Mueller to address advance care planning and complex medical decision making. This is the initial visit.   ASSESSMENT AND PLAN / RECOMMENDATIONS:   Symptom Management/Plan: 1. Advance Care Planning; Full code pending further discussions with family for clarification of medical goals  2. Goals of Care: Goals include to maximize quality of life and symptom management. Our advance care planning conversation included a discussion about:    The value and importance of advance care planning  Exploration of personal, cultural or spiritual beliefs that might influence medical decisions  Exploration of goals of care in the event of a sudden injury or illness  Identification and preparation of a healthcare agent  Review and updating or creation of an advance directive document.  3. Palliative care encounter; Palliative care encounter; Palliative medicine team will continue to support patient, patient's family, and medical team. Visit consisted of counseling and education dealing with the complex and emotionally intense issues of symptom management and palliative care in the setting of serious and potentially life-threatening illness  4. f/u 2 weeks for ongoing monitoring chronic disease progression, ongoing  discussions complex medical decision making  5. Anorexia secondary to schizophrenia, depression; continue to encourage to eat, weights; Diabetic diet, L7-Easy Chew w/minced meats texture, Level 2 - Mildly Thick consistency with med plus supplements. Current weight 109 lbs; BMI 19.3  Follow up Palliative Care Visit: Palliative care will continue to follow for complex medical decision making, advance care planning, and clarification of goals. Return 2 weeks or prn.  I spent 50 minutes providing this consultation. More than 50% of the time in this consultation was spent in counseling and care coordination. PPS: 40%  Chief Complaint: Initial Palliative consult for complex medical decision making  HISTORY OF PRESENT ILLNESS:  Erica Mueller is a 70 y.o. year old female  with multiple medical problems including CAD, DM, HTN, GERD, syphilis, elevated lips, schizophrenia, depression. Erica Mueller was hospitalized 11/07/2020 to 11/11/2020 for severe sepsis with altered mental status secondary to acute cystitis. DM HbA1c 6.7; Discharged to [facility] for STR. Erica Mueller returned to ED on 12/14/2020 following a fall at the facility, minor head injury. CT brain: IMPRESSION: 1. No acute posttraumatic findings identified within the head, face or cervical spine. 2. Chronic atrophy and chronic small vessel ischemic changes. 3. Mild inflammatory paranasal sinus disease. Severe mandibular periodontal disease and dental caries. 4. Multilevel cervical spondylosis. [name] was cleared and returned to [facility] where she currently resides. Erica Mueller does require assistance with bathing, dressing prompting; toileting. Erica Mueller is ambulatory. Erica Mueller does feed herself with varied appetite. Current weight is 109 lbs with BMI 19.3. Staff endorses no other changes. Erica Mueller is a full code. At present, Erica Mueller is lying in bed, sleeping. Erica Mueller awoke  to verbal cues, made eye contact. Erica Mueller was cooperative with assessment. Erica Mueller answered  "yes" to all questions asked. Erica Mueller returned to sleeping during [place] visit. I have attempted to contact Erica Mueller's Mueller for further discussion of medical goals of care, further planning, code status. I updated staff, no new changes to current poc until further discussion with Mueller.  History obtained from review of EMR, discussion with staff and observed Erica Mueller.  I reviewed available labs, medications, imaging, studies and related documents from the EMR.  Records reviewed and summarized above.   ROS Full 14 system review of systems performed and negative with exception of: as per HPI.   Physical Exam: Constitutional: NAD General: frail appearing, thin, confused female EYES: lids intact ENMT: oral mucous membranes moist,  CV: S1S2, RRR Pulmonary: LCTA, no increased work of breathing, no cough, room air Abdomen: normo-active BS + 4 quadrants, soft and non tender, MSK: ambulatory Skin: warm and dry Neuro:  + cognitive impairment Psych: non-anxious affect, Alert, confused  CURRENT PROBLEM LIST:  Patient Active Problem List   Diagnosis Date Noted   Acute metabolic encephalopathy 94/12/6806   Severe sepsis ([place]) 11/07/2020   UTI (urinary tract infection) 11/07/2020   GERD (gastroesophageal reflux disease) 12/20/2017   Hyperlipemia 12/20/2017   History of schizophrenia 07/30/2017   Schizophrenia ([place]) 05/27/2017   Diabetes ([place]) 04/02/2017   Hypertension 04/02/2017   Noncompliance 04/02/2017   Depression 11/17/2013   PAST MEDICAL HISTORY:  Active Ambulatory Problems    Diagnosis Date Noted   Depression 11/17/2013   Diabetes ([place]) 04/02/2017   Hypertension 04/02/2017   Noncompliance 04/02/2017   Schizophrenia ([place]) 05/27/2017   GERD (gastroesophageal reflux disease) 12/20/2017   History of schizophrenia 07/30/2017   Hyperlipemia 81/03/3158   Acute metabolic encephalopathy 45/85/9292   Severe sepsis ([place]) 11/07/2020   UTI (urinary tract infection) 11/07/2020    Resolved Ambulatory Problems    Diagnosis Date Noted   No Resolved Ambulatory Problems   Past Medical History:  Diagnosis Date   Coronary artery disease    Diabetes mellitus without complication ([place])    Elevated lipids    Syphilis (acquired)    SOCIAL HX:  Social History   Tobacco Use   Smoking status: Every Day    Packs/day: 1.00    Years: 48.00    Pack years: 48.00    Types: Cigarettes    Start date: 12/20/1969   Smokeless tobacco: Never  Substance Use Topics   Alcohol use: Yes    Alcohol/week: 3.0 standard drinks    Types: 2 Cans of beer, 1 Shots of liquor per week   FAMILY HX:  Family History  Problem Relation Age of Onset   Cancer Mother    Alcoholism Mother    Bone cancer Father    Heart disease Father    Diabetes Maternal Grandmother    Suicidality Paternal Uncle    Breast cancer Neg Hx     reviewed  ALLERGIES: No Known Allergies    Questions and concerns were addressed. The staff was encouraged to call with questions and/or concerns. Provided general support and encouragement, no other unmet needs identified   Thank you for the opportunity to participate in the care of Erica Mueller.  The palliative care team will continue to follow. Please call our office at [phone] if we can be of additional assistance.   This chart was dictated using voice recognition software.  Despite best efforts to proofread,  errors can occur which can  change the documentation meaning.   Eiley Mcginnity Z Sumayah Bearse, NP ,

## 2021-01-10 ENCOUNTER — Other Ambulatory Visit: Payer: Self-pay

## 2021-01-12 DIAGNOSIS — E1151 Type 2 diabetes mellitus with diabetic peripheral angiopathy without gangrene: Secondary | ICD-10-CM | POA: Diagnosis not present

## 2021-01-12 DIAGNOSIS — B351 Tinea unguium: Secondary | ICD-10-CM | POA: Diagnosis not present

## 2021-02-07 ENCOUNTER — Inpatient Hospital Stay: Payer: Medicare Other

## 2021-02-07 ENCOUNTER — Inpatient Hospital Stay
Admission: EM | Admit: 2021-02-07 | Discharge: 2021-03-29 | DRG: 871 | Disposition: A | Discharge status: Skilled Nursing Facility | Payer: Medicare Other | Source: Skilled Nursing Facility | Attending: Internal Medicine | Admitting: Internal Medicine

## 2021-02-07 ENCOUNTER — Other Ambulatory Visit: Payer: Self-pay

## 2021-02-07 ENCOUNTER — Emergency Department: Payer: Medicare Other

## 2021-02-07 DIAGNOSIS — E1169 Type 2 diabetes mellitus with other specified complication: Secondary | ICD-10-CM | POA: Diagnosis not present

## 2021-02-07 DIAGNOSIS — R748 Abnormal levels of other serum enzymes: Secondary | ICD-10-CM | POA: Diagnosis present

## 2021-02-07 DIAGNOSIS — R4189 Other symptoms and signs involving cognitive functions and awareness: Secondary | ICD-10-CM | POA: Diagnosis present

## 2021-02-07 DIAGNOSIS — U071 COVID-19: Secondary | ICD-10-CM | POA: Diagnosis not present

## 2021-02-07 DIAGNOSIS — A4189 Other specified sepsis: Principal | ICD-10-CM | POA: Diagnosis present

## 2021-02-07 DIAGNOSIS — Z0389 Encounter for observation for other suspected diseases and conditions ruled out: Secondary | ICD-10-CM | POA: Diagnosis not present

## 2021-02-07 DIAGNOSIS — E785 Hyperlipidemia, unspecified: Secondary | ICD-10-CM | POA: Diagnosis present

## 2021-02-07 DIAGNOSIS — N390 Urinary tract infection, site not specified: Secondary | ICD-10-CM | POA: Diagnosis not present

## 2021-02-07 DIAGNOSIS — F061 Catatonic disorder due to known physiological condition: Secondary | ICD-10-CM | POA: Diagnosis not present

## 2021-02-07 DIAGNOSIS — I808 Phlebitis and thrombophlebitis of other sites: Secondary | ICD-10-CM | POA: Diagnosis not present

## 2021-02-07 DIAGNOSIS — J9601 Acute respiratory failure with hypoxia: Secondary | ICD-10-CM | POA: Diagnosis not present

## 2021-02-07 DIAGNOSIS — E876 Hypokalemia: Secondary | ICD-10-CM | POA: Diagnosis present

## 2021-02-07 DIAGNOSIS — R600 Localized edema: Secondary | ICD-10-CM | POA: Diagnosis not present

## 2021-02-07 DIAGNOSIS — I2510 Atherosclerotic heart disease of native coronary artery without angina pectoris: Secondary | ICD-10-CM | POA: Diagnosis present

## 2021-02-07 DIAGNOSIS — K8020 Calculus of gallbladder without cholecystitis without obstruction: Secondary | ICD-10-CM | POA: Diagnosis not present

## 2021-02-07 DIAGNOSIS — D696 Thrombocytopenia, unspecified: Secondary | ICD-10-CM | POA: Diagnosis not present

## 2021-02-07 DIAGNOSIS — I472 Ventricular tachycardia, unspecified: Secondary | ICD-10-CM | POA: Diagnosis present

## 2021-02-07 DIAGNOSIS — F209 Schizophrenia, unspecified: Secondary | ICD-10-CM | POA: Diagnosis present

## 2021-02-07 DIAGNOSIS — G9340 Encephalopathy, unspecified: Secondary | ICD-10-CM | POA: Diagnosis not present

## 2021-02-07 DIAGNOSIS — Z681 Body mass index (BMI) 19 or less, adult: Secondary | ICD-10-CM | POA: Diagnosis not present

## 2021-02-07 DIAGNOSIS — R4182 Altered mental status, unspecified: Secondary | ICD-10-CM | POA: Diagnosis present

## 2021-02-07 DIAGNOSIS — E872 Acidosis, unspecified: Secondary | ICD-10-CM | POA: Diagnosis not present

## 2021-02-07 DIAGNOSIS — J690 Pneumonitis due to inhalation of food and vomit: Secondary | ICD-10-CM | POA: Diagnosis not present

## 2021-02-07 DIAGNOSIS — Z23 Encounter for immunization: Secondary | ICD-10-CM

## 2021-02-07 DIAGNOSIS — E870 Hyperosmolality and hypernatremia: Secondary | ICD-10-CM

## 2021-02-07 DIAGNOSIS — E11649 Type 2 diabetes mellitus with hypoglycemia without coma: Secondary | ICD-10-CM | POA: Diagnosis not present

## 2021-02-07 DIAGNOSIS — I700 Atherosclerosis of aorta: Secondary | ICD-10-CM | POA: Diagnosis present

## 2021-02-07 DIAGNOSIS — K219 Gastro-esophageal reflux disease without esophagitis: Secondary | ICD-10-CM | POA: Diagnosis present

## 2021-02-07 DIAGNOSIS — F0393 Unspecified dementia, unspecified severity, with mood disturbance: Secondary | ICD-10-CM | POA: Diagnosis present

## 2021-02-07 DIAGNOSIS — I517 Cardiomegaly: Secondary | ICD-10-CM | POA: Diagnosis not present

## 2021-02-07 DIAGNOSIS — R2230 Localized swelling, mass and lump, unspecified upper limb: Secondary | ICD-10-CM

## 2021-02-07 DIAGNOSIS — E43 Unspecified severe protein-calorie malnutrition: Secondary | ICD-10-CM | POA: Diagnosis not present

## 2021-02-07 DIAGNOSIS — D638 Anemia in other chronic diseases classified elsewhere: Secondary | ICD-10-CM

## 2021-02-07 DIAGNOSIS — I10 Essential (primary) hypertension: Secondary | ICD-10-CM

## 2021-02-07 DIAGNOSIS — F250 Schizoaffective disorder, bipolar type: Secondary | ICD-10-CM | POA: Diagnosis not present

## 2021-02-07 DIAGNOSIS — F259 Schizoaffective disorder, unspecified: Secondary | ICD-10-CM | POA: Diagnosis present

## 2021-02-07 DIAGNOSIS — R633 Feeding difficulties, unspecified: Secondary | ICD-10-CM | POA: Diagnosis not present

## 2021-02-07 DIAGNOSIS — R791 Abnormal coagulation profile: Secondary | ICD-10-CM | POA: Diagnosis not present

## 2021-02-07 DIAGNOSIS — S91009A Unspecified open wound, unspecified ankle, initial encounter: Secondary | ICD-10-CM | POA: Diagnosis present

## 2021-02-07 DIAGNOSIS — G928 Other toxic encephalopathy: Secondary | ICD-10-CM | POA: Diagnosis not present

## 2021-02-07 DIAGNOSIS — E860 Dehydration: Secondary | ICD-10-CM | POA: Diagnosis present

## 2021-02-07 DIAGNOSIS — R6520 Severe sepsis without septic shock: Secondary | ICD-10-CM | POA: Diagnosis present

## 2021-02-07 DIAGNOSIS — I428 Other cardiomyopathies: Secondary | ICD-10-CM | POA: Diagnosis not present

## 2021-02-07 DIAGNOSIS — Z931 Gastrostomy status: Secondary | ICD-10-CM | POA: Diagnosis not present

## 2021-02-07 DIAGNOSIS — R1310 Dysphagia, unspecified: Secondary | ICD-10-CM | POA: Diagnosis present

## 2021-02-07 DIAGNOSIS — E119 Type 2 diabetes mellitus without complications: Secondary | ICD-10-CM | POA: Diagnosis not present

## 2021-02-07 DIAGNOSIS — M6281 Muscle weakness (generalized): Secondary | ICD-10-CM | POA: Diagnosis not present

## 2021-02-07 DIAGNOSIS — R7401 Elevation of levels of liver transaminase levels: Secondary | ICD-10-CM | POA: Diagnosis present

## 2021-02-07 DIAGNOSIS — Z833 Family history of diabetes mellitus: Secondary | ICD-10-CM

## 2021-02-07 DIAGNOSIS — R64 Cachexia: Secondary | ICD-10-CM | POA: Diagnosis not present

## 2021-02-07 DIAGNOSIS — K6389 Other specified diseases of intestine: Secondary | ICD-10-CM | POA: Diagnosis not present

## 2021-02-07 DIAGNOSIS — R739 Hyperglycemia, unspecified: Secondary | ICD-10-CM

## 2021-02-07 DIAGNOSIS — L8992 Pressure ulcer of unspecified site, stage 2: Secondary | ICD-10-CM | POA: Insufficient documentation

## 2021-02-07 DIAGNOSIS — Z4682 Encounter for fitting and adjustment of non-vascular catheter: Secondary | ICD-10-CM | POA: Diagnosis not present

## 2021-02-07 DIAGNOSIS — J439 Emphysema, unspecified: Secondary | ICD-10-CM | POA: Diagnosis not present

## 2021-02-07 DIAGNOSIS — R6889 Other general symptoms and signs: Secondary | ICD-10-CM | POA: Diagnosis not present

## 2021-02-07 DIAGNOSIS — R627 Adult failure to thrive: Secondary | ICD-10-CM | POA: Diagnosis not present

## 2021-02-07 DIAGNOSIS — A419 Sepsis, unspecified organism: Secondary | ICD-10-CM | POA: Diagnosis not present

## 2021-02-07 DIAGNOSIS — I429 Cardiomyopathy, unspecified: Secondary | ICD-10-CM | POA: Diagnosis present

## 2021-02-07 DIAGNOSIS — R404 Transient alteration of awareness: Secondary | ICD-10-CM | POA: Diagnosis not present

## 2021-02-07 DIAGNOSIS — G9341 Metabolic encephalopathy: Secondary | ICD-10-CM | POA: Diagnosis not present

## 2021-02-07 DIAGNOSIS — Z741 Need for assistance with personal care: Secondary | ICD-10-CM | POA: Diagnosis not present

## 2021-02-07 DIAGNOSIS — I5042 Chronic combined systolic (congestive) and diastolic (congestive) heart failure: Secondary | ICD-10-CM

## 2021-02-07 DIAGNOSIS — D6959 Other secondary thrombocytopenia: Secondary | ICD-10-CM | POA: Diagnosis not present

## 2021-02-07 DIAGNOSIS — T85598D Other mechanical complication of other gastrointestinal prosthetic devices, implants and grafts, subsequent encounter: Secondary | ICD-10-CM | POA: Diagnosis not present

## 2021-02-07 DIAGNOSIS — I499 Cardiac arrhythmia, unspecified: Secondary | ICD-10-CM | POA: Diagnosis not present

## 2021-02-07 DIAGNOSIS — Z7189 Other specified counseling: Secondary | ICD-10-CM | POA: Diagnosis not present

## 2021-02-07 DIAGNOSIS — L8990 Pressure ulcer of unspecified site, unspecified stage: Secondary | ICD-10-CM | POA: Insufficient documentation

## 2021-02-07 DIAGNOSIS — Z7401 Bed confinement status: Secondary | ICD-10-CM | POA: Diagnosis not present

## 2021-02-07 DIAGNOSIS — E1165 Type 2 diabetes mellitus with hyperglycemia: Secondary | ICD-10-CM

## 2021-02-07 DIAGNOSIS — R2231 Localized swelling, mass and lump, right upper limb: Secondary | ICD-10-CM | POA: Diagnosis not present

## 2021-02-07 DIAGNOSIS — Z8249 Family history of ischemic heart disease and other diseases of the circulatory system: Secondary | ICD-10-CM

## 2021-02-07 DIAGNOSIS — L89152 Pressure ulcer of sacral region, stage 2: Secondary | ICD-10-CM | POA: Diagnosis not present

## 2021-02-07 DIAGNOSIS — I110 Hypertensive heart disease with heart failure: Secondary | ICD-10-CM | POA: Diagnosis present

## 2021-02-07 DIAGNOSIS — N179 Acute kidney failure, unspecified: Secondary | ICD-10-CM | POA: Diagnosis not present

## 2021-02-07 DIAGNOSIS — Z515 Encounter for palliative care: Secondary | ICD-10-CM | POA: Diagnosis not present

## 2021-02-07 DIAGNOSIS — K9423 Gastrostomy malfunction: Secondary | ICD-10-CM | POA: Diagnosis not present

## 2021-02-07 DIAGNOSIS — L723 Sebaceous cyst: Secondary | ICD-10-CM | POA: Diagnosis not present

## 2021-02-07 DIAGNOSIS — F202 Catatonic schizophrenia: Secondary | ICD-10-CM | POA: Diagnosis not present

## 2021-02-07 DIAGNOSIS — R609 Edema, unspecified: Secondary | ICD-10-CM

## 2021-02-07 DIAGNOSIS — R1312 Dysphagia, oropharyngeal phase: Secondary | ICD-10-CM | POA: Diagnosis not present

## 2021-02-07 DIAGNOSIS — Z79899 Other long term (current) drug therapy: Secondary | ICD-10-CM

## 2021-02-07 DIAGNOSIS — Z7984 Long term (current) use of oral hypoglycemic drugs: Secondary | ICD-10-CM

## 2021-02-07 DIAGNOSIS — F332 Major depressive disorder, recurrent severe without psychotic features: Secondary | ICD-10-CM | POA: Diagnosis not present

## 2021-02-07 DIAGNOSIS — Z4659 Encounter for fitting and adjustment of other gastrointestinal appliance and device: Secondary | ICD-10-CM

## 2021-02-07 DIAGNOSIS — Z0189 Encounter for other specified special examinations: Secondary | ICD-10-CM

## 2021-02-07 DIAGNOSIS — R1319 Other dysphagia: Secondary | ICD-10-CM | POA: Diagnosis not present

## 2021-02-07 DIAGNOSIS — T85598A Other mechanical complication of other gastrointestinal prosthetic devices, implants and grafts, initial encounter: Secondary | ICD-10-CM

## 2021-02-07 DIAGNOSIS — I701 Atherosclerosis of renal artery: Secondary | ICD-10-CM | POA: Diagnosis not present

## 2021-02-07 DIAGNOSIS — F17210 Nicotine dependence, cigarettes, uncomplicated: Secondary | ICD-10-CM | POA: Diagnosis present

## 2021-02-07 DIAGNOSIS — Z743 Need for continuous supervision: Secondary | ICD-10-CM | POA: Diagnosis not present

## 2021-02-07 DIAGNOSIS — Z1159 Encounter for screening for other viral diseases: Secondary | ICD-10-CM | POA: Diagnosis not present

## 2021-02-07 LAB — RESP PANEL BY RT-PCR (FLU A&B, COVID) ARPGX2
Influenza A by PCR: NEGATIVE
Influenza B by PCR: NEGATIVE
SARS Coronavirus 2 by RT PCR: POSITIVE — AB

## 2021-02-07 LAB — CBC WITH DIFFERENTIAL/PLATELET
Abs Immature Granulocytes: 0.11 10*3/uL — ABNORMAL HIGH (ref 0.00–0.07)
Basophils Absolute: 0 10*3/uL (ref 0.0–0.1)
Basophils Relative: 0 %
Eosinophils Absolute: 0 10*3/uL (ref 0.0–0.5)
Eosinophils Relative: 0 %
HCT: 50.5 % — ABNORMAL HIGH (ref 36.0–46.0)
Hemoglobin: 15.1 g/dL — ABNORMAL HIGH (ref 12.0–15.0)
Immature Granulocytes: 1 %
Lymphocytes Relative: 6 %
Lymphs Abs: 1 10*3/uL (ref 0.7–4.0)
MCH: 30 pg (ref 26.0–34.0)
MCHC: 29.9 g/dL — ABNORMAL LOW (ref 30.0–36.0)
MCV: 100.4 fL — ABNORMAL HIGH (ref 80.0–100.0)
Monocytes Absolute: 0.8 10*3/uL (ref 0.1–1.0)
Monocytes Relative: 4 %
Neutro Abs: 15.3 10*3/uL — ABNORMAL HIGH (ref 1.7–7.7)
Neutrophils Relative %: 89 %
Platelets: 159 10*3/uL (ref 150–400)
RBC: 5.03 MIL/uL (ref 3.87–5.11)
RDW: 14.3 % (ref 11.5–15.5)
WBC: 17.2 10*3/uL — ABNORMAL HIGH (ref 4.0–10.5)
nRBC: 0 % (ref 0.0–0.2)

## 2021-02-07 LAB — COMPREHENSIVE METABOLIC PANEL
ALT: 12 U/L (ref 0–44)
AST: 14 U/L — ABNORMAL LOW (ref 15–41)
Albumin: 3.4 g/dL — ABNORMAL LOW (ref 3.5–5.0)
Alkaline Phosphatase: 57 U/L (ref 38–126)
Anion gap: 15 (ref 5–15)
BUN: 88 mg/dL — ABNORMAL HIGH (ref 8–23)
CO2: 25 mmol/L (ref 22–32)
Calcium: 9.8 mg/dL (ref 8.9–10.3)
Chloride: 126 mmol/L — ABNORMAL HIGH (ref 98–111)
Creatinine, Ser: 1.42 mg/dL — ABNORMAL HIGH (ref 0.44–1.00)
GFR, Estimated: 40 mL/min — ABNORMAL LOW (ref >60)
Glucose, Bld: 360 mg/dL — ABNORMAL HIGH (ref 70–99)
Potassium: 3.5 mmol/L (ref 3.5–5.1)
Sodium: 166 mmol/L (ref 135–145)
Total Bilirubin: 1 mg/dL (ref 0.3–1.2)
Total Protein: 8.2 g/dL — ABNORMAL HIGH (ref 6.5–8.1)

## 2021-02-07 LAB — D-DIMER, QUANTITATIVE: D-Dimer, Quant: >20 ug/mL-FEU — ABNORMAL HIGH (ref 0.00–0.50)

## 2021-02-07 LAB — APTT: aPTT: 34 seconds (ref 24–36)

## 2021-02-07 LAB — TSH: TSH: 1.837 u[IU]/mL (ref 0.350–4.500)

## 2021-02-07 LAB — URINALYSIS, COMPLETE (UACMP) WITH MICROSCOPIC
Bilirubin Urine: NEGATIVE
Glucose, UA: 50 mg/dL — AB
Ketones, ur: 5 mg/dL — AB
Nitrite: NEGATIVE
Protein, ur: 30 mg/dL — AB
Specific Gravity, Urine: 1.026 (ref 1.005–1.030)
pH: 5 (ref 5.0–8.0)

## 2021-02-07 LAB — BLOOD GAS, VENOUS
Acid-Base Excess: 3.9 mmol/L — ABNORMAL HIGH (ref 0.0–2.0)
Bicarbonate: 28.5 mmol/L — ABNORMAL HIGH (ref 20.0–28.0)
O2 Saturation: 88.9 %
Patient temperature: 37
pCO2, Ven: 42 mmHg — ABNORMAL LOW (ref 44.0–60.0)
pH, Ven: 7.44 — ABNORMAL HIGH (ref 7.250–7.430)
pO2, Ven: 54 mmHg — ABNORMAL HIGH (ref 32.0–45.0)

## 2021-02-07 LAB — BASIC METABOLIC PANEL
Anion gap: 7 (ref 5–15)
BUN: 62 mg/dL — ABNORMAL HIGH (ref 8–23)
CO2: 28 mmol/L (ref 22–32)
Calcium: 8.5 mg/dL — ABNORMAL LOW (ref 8.9–10.3)
Chloride: 128 mmol/L — ABNORMAL HIGH (ref 98–111)
Creatinine, Ser: 1.14 mg/dL — ABNORMAL HIGH (ref 0.44–1.00)
GFR, Estimated: 52 mL/min — ABNORMAL LOW (ref >60)
Glucose, Bld: 240 mg/dL — ABNORMAL HIGH (ref 70–99)
Potassium: 3 mmol/L — ABNORMAL LOW (ref 3.5–5.1)
Sodium: 163 mmol/L (ref 135–145)

## 2021-02-07 LAB — CBG MONITORING, ED
Glucose-Capillary: 115 mg/dL — ABNORMAL HIGH (ref 70–99)
Glucose-Capillary: 251 mg/dL — ABNORMAL HIGH (ref 70–99)
Glucose-Capillary: 298 mg/dL — ABNORMAL HIGH (ref 70–99)

## 2021-02-07 LAB — PROTIME-INR
INR: 1.5 — ABNORMAL HIGH (ref 0.8–1.2)
Prothrombin Time: 18.1 seconds — ABNORMAL HIGH (ref 11.4–15.2)

## 2021-02-07 LAB — LACTIC ACID, PLASMA
Lactic Acid, Venous: 2.3 mmol/L (ref 0.5–1.9)
Lactic Acid, Venous: 3.6 mmol/L (ref 0.5–1.9)
Lactic Acid, Venous: 3.8 mmol/L (ref 0.5–1.9)

## 2021-02-07 LAB — MAGNESIUM: Magnesium: 3.1 mg/dL — ABNORMAL HIGH (ref 1.7–2.4)

## 2021-02-07 LAB — FIBRINOGEN: Fibrinogen: 437 mg/dL (ref 210–475)

## 2021-02-07 LAB — TROPONIN I (HIGH SENSITIVITY)
Troponin I (High Sensitivity): 144 ng/L (ref <18)
Troponin I (High Sensitivity): 176 ng/L (ref <18)

## 2021-02-07 LAB — PROCALCITONIN: Procalcitonin: 0.12 ng/mL

## 2021-02-07 MED ORDER — LACTATED RINGERS IV BOLUS
1000.0000 mL | Freq: Once | INTRAVENOUS | Status: AC
  Administered 2021-02-07: 1000 mL via INTRAVENOUS

## 2021-02-07 MED ORDER — METRONIDAZOLE 500 MG/100ML IV SOLN
500.0000 mg | Freq: Once | INTRAVENOUS | Status: AC
  Administered 2021-02-07: 500 mg via INTRAVENOUS
  Filled 2021-02-07: qty 100

## 2021-02-07 MED ORDER — SODIUM CHLORIDE 0.9 % IV SOLN
2.0000 g | Freq: Once | INTRAVENOUS | Status: AC
  Administered 2021-02-07: 2 g via INTRAVENOUS
  Filled 2021-02-07: qty 2

## 2021-02-07 MED ORDER — ONDANSETRON HCL 4 MG/2ML IJ SOLN: 4.0000 mg | Freq: Four times a day (QID) | INTRAMUSCULAR | Status: DC | PRN

## 2021-02-07 MED ORDER — ZINC SULFATE 220 (50 ZN) MG PO CAPS: 220.0000 mg | ORAL_CAPSULE | Freq: Every day | ORAL | Status: DC

## 2021-02-07 MED ORDER — SODIUM CHLORIDE 0.9 % IV SOLN: 2.0000 g | INTRAVENOUS | Status: DC

## 2021-02-07 MED ORDER — LACTATED RINGERS IV SOLN: INTRAVENOUS | Status: DC

## 2021-02-07 MED ORDER — ASCORBIC ACID 500 MG PO TABS
500.0000 mg | ORAL_TABLET | Freq: Every day | ORAL | Status: DC
  Administered 2021-02-11 – 2021-02-15 (×5): 500 mg via ORAL
  Filled 2021-02-07 (×6): qty 1

## 2021-02-07 MED ORDER — SODIUM CHLORIDE 0.9 % IV SOLN
2.0000 g | Freq: Two times a day (BID) | INTRAVENOUS | Status: DC
  Administered 2021-02-08 – 2021-02-09 (×3): 2 g via INTRAVENOUS
  Filled 2021-02-07 (×3): qty 2

## 2021-02-07 MED ORDER — VANCOMYCIN HCL IN DEXTROSE 1-5 GM/200ML-% IV SOLN
1000.0000 mg | Freq: Once | INTRAVENOUS | Status: AC
  Administered 2021-02-07: 1000 mg via INTRAVENOUS
  Filled 2021-02-07: qty 200

## 2021-02-07 MED ORDER — ACETAMINOPHEN 325 MG PO TABS: 650.0000 mg | ORAL_TABLET | ORAL | Status: DC | PRN

## 2021-02-07 MED ORDER — INSULIN ASPART 100 UNIT/ML IJ SOLN
0.0000 [IU] | INTRAMUSCULAR | Status: DC
  Administered 2021-02-07 (×2): 8 [IU] via SUBCUTANEOUS
  Administered 2021-02-08 (×3): 3 [IU] via SUBCUTANEOUS
  Administered 2021-02-09: 2 [IU] via SUBCUTANEOUS
  Administered 2021-02-09: 3 [IU] via SUBCUTANEOUS
  Administered 2021-02-09 (×2): 2 [IU] via SUBCUTANEOUS
  Administered 2021-02-10: 5 [IU] via SUBCUTANEOUS
  Administered 2021-02-10: 3 [IU] via SUBCUTANEOUS
  Administered 2021-02-10 – 2021-02-11 (×3): 2 [IU] via SUBCUTANEOUS
  Administered 2021-02-11 (×3): 5 [IU] via SUBCUTANEOUS
  Administered 2021-02-11: 8 [IU] via SUBCUTANEOUS
  Administered 2021-02-12: 3 [IU] via SUBCUTANEOUS
  Administered 2021-02-12: 8 [IU] via SUBCUTANEOUS
  Administered 2021-02-12: 5 [IU] via SUBCUTANEOUS
  Administered 2021-02-12: 3 [IU] via SUBCUTANEOUS
  Administered 2021-02-12: 8 [IU] via SUBCUTANEOUS
  Administered 2021-02-13: 3 [IU] via SUBCUTANEOUS
  Administered 2021-02-13: 5 [IU] via SUBCUTANEOUS
  Administered 2021-02-13: 2 [IU] via SUBCUTANEOUS
  Administered 2021-02-13: 3 [IU] via SUBCUTANEOUS
  Administered 2021-02-13 – 2021-02-14 (×2): 8 [IU] via SUBCUTANEOUS
  Administered 2021-02-14 (×3): 3 [IU] via SUBCUTANEOUS
  Administered 2021-02-14 – 2021-02-15 (×2): 5 [IU] via SUBCUTANEOUS
  Administered 2021-02-15: 3 [IU] via SUBCUTANEOUS
  Administered 2021-02-15: 5 [IU] via SUBCUTANEOUS
  Administered 2021-02-15: 3 [IU] via SUBCUTANEOUS
  Administered 2021-02-15: 4 [IU] via SUBCUTANEOUS
  Administered 2021-02-15: 3 [IU] via SUBCUTANEOUS
  Administered 2021-02-16: 2 [IU] via SUBCUTANEOUS
  Administered 2021-02-16: 3 [IU] via SUBCUTANEOUS
  Administered 2021-02-16: 8 [IU] via SUBCUTANEOUS
  Administered 2021-02-16 – 2021-02-17 (×3): 3 [IU] via SUBCUTANEOUS
  Administered 2021-02-17 (×2): 2 [IU] via SUBCUTANEOUS
  Administered 2021-02-17: 5 [IU] via SUBCUTANEOUS
  Administered 2021-02-18 (×2): 3 [IU] via SUBCUTANEOUS
  Administered 2021-02-18: 2 [IU] via SUBCUTANEOUS
  Administered 2021-02-18: 3 [IU] via SUBCUTANEOUS
  Administered 2021-02-18 (×2): 5 [IU] via SUBCUTANEOUS
  Administered 2021-02-19: 3 [IU] via SUBCUTANEOUS
  Administered 2021-02-19 (×2): 5 [IU] via SUBCUTANEOUS
  Administered 2021-02-19: 3 [IU] via SUBCUTANEOUS
  Administered 2021-02-19: 5 [IU] via SUBCUTANEOUS
  Administered 2021-02-19: 3 [IU] via SUBCUTANEOUS
  Administered 2021-02-20: 5 [IU] via SUBCUTANEOUS
  Administered 2021-02-20 (×3): 3 [IU] via SUBCUTANEOUS
  Filled 2021-02-07 (×65): qty 1

## 2021-02-07 MED ORDER — POTASSIUM CHLORIDE 10 MEQ/100ML IV SOLN
10.0000 meq | INTRAVENOUS | Status: AC
Start: 2021-02-07 — End: 2021-02-08
  Administered 2021-02-07 – 2021-02-08 (×4): 10 meq via INTRAVENOUS
  Filled 2021-02-07 (×4): qty 100

## 2021-02-07 MED ORDER — IOHEXOL 350 MG/ML SOLN
75.0000 mL | Freq: Once | INTRAVENOUS | Status: AC | PRN
  Administered 2021-02-07: 75 mL via INTRAVENOUS

## 2021-02-07 MED ORDER — DOCUSATE SODIUM 100 MG PO CAPS: 100.0000 mg | ORAL_CAPSULE | Freq: Two times a day (BID) | ORAL | Status: DC | PRN

## 2021-02-07 MED ORDER — POLYETHYLENE GLYCOL 3350 17 G PO PACK
17.0000 g | PACK | Freq: Every day | ORAL | Status: DC | PRN
  Administered 2021-03-08: 17 g via ORAL
  Filled 2021-02-07: qty 1

## 2021-02-07 MED ORDER — HEPARIN SODIUM (PORCINE) 5000 UNIT/ML IJ SOLN
5000.0000 [IU] | Freq: Three times a day (TID) | INTRAMUSCULAR | Status: DC
  Administered 2021-02-07 – 2021-02-22 (×45): 5000 [IU] via SUBCUTANEOUS
  Filled 2021-02-07 (×45): qty 1

## 2021-02-07 NOTE — ED Notes (Signed)
MD Kasa at bedside ?

## 2021-02-07 NOTE — ED Triage Notes (Addendum)
Pt comes via EMs from [facility] care with c/o 24 hour AMs. EMs reports [id number] unknown if pt received insulin prior. Pt also has new onset of tremors.  O2-100 RA T-97.1 ax HR-120 BP136/72  Pt is unresponsive at this time. IV started by this RN, blood collected and vitals checked. RN Matt at bedside sternal rubs pt little response. MD Katrinka Blazing at bedside.

## 2021-02-07 NOTE — H&P (Signed)
NAME:  Erica Mueller, MRN:  [id number], DOB:  03/19/1951, LOS: 0 ADMISSION DATE:  02/07/2021, CONSULTATION DATE:  02/07/2021 REFERRING MD:  Dr. Tamala Julian, CHIEF COMPLAINT:  Altered Mental Status   Brief Pt Description / Synopsis:  70 year old female admitted with severe sepsis due to UTI and COVID-19 infection, acute metabolic encephalopathy in the setting of sepsis and severe hyponatremia, and acute kidney injury.  History of Present Illness:  Erica Mueller is a 70 year old female with a past medical history significant for hypertension, diabetes mellitus, schizophrenia, and depression who presented to [facility] ED on 02/07/2021 from [facility] due to altered mental status.  Patient currently remains altered and no family is available, therefore history is obtained from ED and nursing notes.  Per notes EMS was dispatched as she became acutely altered today.  No history of recent falls or injuries or other symptoms.  Of note she was recently admitted from 11/07/2020 through 11/11/2020 for sepsis and acute metabolic encephalopathy due to urinary tract infection.  ED Course: Upon arrival to the ED she would open her eyes and withdraw from pain, but otherwise unable to participate in interview or exam.  She appeared extremely dehydrated and was tachycardic, tachypneic, and hypoxic with O2 sats 91% on room air. Initial vital signs: Temperature 98.4, RR 44, pulse 127, blood pressure 100/67, SPO2 91% on room air Labs: Sodium 166, chloride 126, glucose 360, BUN 88, creatinine 1.42, lactic acid 2.3, procalcitonin 0.12, WBC 17.2 with neutrophilia, D-dimer greater than 20 Venous blood gas: pH 7.44/PCO2 42/PO2 54/bicarb 20.5 Urinalysis consistent with UTI Positive for COVID-19 (CT time 30.2) Imaging: Chest x-ray>>No acute cardiopulmonary process. CT head without contrast>>1. No acute intracranial findings. 2. Chronic microvascular ischemic change and cerebral volume loss. 3. Chronic right maxillary  sinusitis. 4. Partial opacification of the bilateral mastoid air cells.  She met sepsis criteria therefore she was given 2 L of IV fluid boluses and broad-spectrum antibiotics including Flagyl, cefepime, vancomycin.  PCCM is asked to admit the patient to stepdown unit for further work-up and treatment of severe sepsis due to UTI and COVID-19 infection, acute metabolic encephalopathy in the setting of sepsis and severe hypernatremia, and acute kidney injury.  Pertinent  Medical History  Coronary artery disease Hypertension Hyperlipidemia Diabetes mellitus Schizophrenia Depression Syphilis  Micro Data:  02/07/2021: SARS-CoV-2 PCR>> positive 02/07/2021: Influenza PCR>> negative 02/07/2021: Blood culture>> 02/07/2021: Urine>>  Antimicrobials:  Flagyl 8/30 x 1 dose Vancomycin 8/30 x 1 dose Cefepime 8/30>>  Significant Hospital Events: Including procedures, antibiotic start and stop dates in addition to other pertinent events   02/07/2021: Presented to ED with altered mental status, to be admitted to stepdown.  Nephrology consulted for severe hyponatremia.  Interim History / Subjective:  -Presented to ED due to complaints of altered mental status -Found to have severe sepsis due to UTI and severe hyponatremia with sodium 166 -Nephrology consulted -Critically ill, high risk for intubation, cardiac arrest, death  Objective   Blood pressure (!) 161/63, pulse (!) 102, temperature 98.4 F (36.9 C), resp. rate (!) 25, SpO2 98 %.       No intake or output data in the 24 hours ending 02/07/21 1839 There were no vitals filed for this visit.  Examination: General: Acute on chronically ill-appearing female, laying in bed, lethargic, no acute distress HENT: Atraumatic, normocephalic, neck supple, no JVD Lungs: Unable to assess due to catheter, even, mild tachypnea, no accessory muscle use Cardiovascular: Tachycardia, regular rhythm, 2+ distal pulses Abdomen: Soft, nontender, nondistended,  no  guarding rebound tenderness Extremities: Withdraws from pain to all extremities, contractured, no edema Neuro: Lethargic, withdraws from pain, does not follow commands, only moans and groans, pupils PERRLA GU: Foley catheter in place draining dark yellow urine  Resolved Hospital Problem list   N/A  Assessment & Plan:   Severe Sepsis due to UTI & COVID-19 -Monitor fever curve -Trend WBC's & Procalcitonin -Follow cultures as above -Continue empiric Cefepime pending cultures & sensitivities -CT time for COVID is 30.2, indicative of Active infection -Discussed with Dr. Mortimer Fries, will not treat with Remdesivir or Paxlovid at this time as no infiltrates on CXR  Tachycardia due to sepsis and dehydration PMHx of HTN, HLD -Continuous cardiac monitoring -Maintain MAP >65 -IV fluids -Vasopressors as needed to maintain MAP goal -Trend lactic acid until normalized (2.3 ~ 3.6 ~) -Trend HS Troponin until peaked -Echocardiogram pending -Will hold home antihypertensives for now given potential for development of septic shock  Hypernatremia, suspect due to dehydration Acute Kidney Injury -Monitor I&O's / urinary output -Follow BMP -Ensure adequate renal perfusion -Avoid nephrotoxic agents as able -Replace electrolytes as indicated -IV fluids (received 2L of LR bolus, will adjust fluids according to D5 vs 1/2 NS based on repeat BMP that is pending currently) -Nephrology consulted, appreciate input  Acute Metabolic Encephalopathy in setting of Sepsis and Hypernatremia PMHx of Schizophrenia, depression -Treat sepsis and Hypernatremia -Avoid sedating medications as able -Provide supportive care -CT Head negative 02/07/21  Acute Hypoxic Respiratory Failure, suspect due to Sepsis and multiple metabolic derangements ? Pulmonary Embolism -Supplemental O2 as needed to maintain O2 sats >92% -High risk for intubation -Follow intermittent Chest X-ray & ABG as needed -Prn Bronchodilators -Initial  CXR without acute process -Unable to obtain CTA Chest to rule out PE due to acute kidney injury -D-dimer >20 -Discussed with Dr. Mortimer Fries, will obtain Venous US of BLE and Echocardiogram ~ will hold off on systemic anticoagulation at this time pending results of [place], Echo, & Troponin  Diabetes Mellitus -CBG's q4h; Target range of 140 to 180 -SSI -Follow ICU Hypo/Hyperglycemia protocol   Pt is critically ill, prognosis is guarded.  High risk for cardiac arrest and death.   Best Practice (right click and "Reselect all SmartList Selections" daily)   Diet/type: NPO DVT prophylaxis: prophylactic heparin  GI prophylaxis: PPI Lines: N/A Foley:  Yes, and it is still needed Code Status:  full code Last date of multidisciplinary goals of care discussion [N/A]  Dr. Mortimer Fries updated pts' brother Sheral Flow via telephone 02/07/21.  Labs   CBC: Recent Labs  Lab 02/07/21 1516  WBC 17.2*  NEUTROABS 15.3*  HGB 15.1*  HCT 50.5*  MCV 100.4*  PLT 287    Basic Metabolic Panel: Recent Labs  Lab 02/07/21 1516  NA 166*  K 3.5  CL 126*  CO2 25  GLUCOSE 360*  BUN 88*  CREATININE 1.42*  CALCIUM 9.8  MG 3.1*   GFR: CrCl cannot be calculated (Unknown ideal weight.). Recent Labs  Lab 02/07/21 1516 02/07/21 1700  PROCALCITON 0.12  --   WBC 17.2*  --   LATICACIDVEN 2.3* 3.6*    Liver Function Tests: Recent Labs  Lab 02/07/21 1516  AST 14*  ALT 12  ALKPHOS 57  BILITOT 1.0  PROT 8.2*  ALBUMIN 3.4*   No results for input(s): LIPASE, AMYLASE in the last 168 hours. No results for input(s): AMMONIA in the last 168 hours.  ABG    Component Value Date/Time   HCO3 28.5 (H) 02/07/2021 1536  O2SAT 88.9 02/07/2021 1536     Coagulation Profile: Recent Labs  Lab 02/07/21 1516  INR 1.5*    Cardiac Enzymes: No results for input(s): CKTOTAL, CKMB, CKMBINDEX, TROPONINI in the last 168 hours.  HbA1C: Hgb A1c MFr Bld  Date/Time Value Ref Range Status  11/08/2020 07:16 AM 6.7 (H)  4.8 - 5.6 % Final    Comment:    (NOTE)         Prediabetes: 5.7 - 6.4         Diabetes: >6.4         Glycemic control for adults with diabetes: <7.0   10/16/2017 09:35 AM 8.9 (H) 4.8 - 5.6 % Final    Comment:    (NOTE) Pre diabetes:          5.7%-6.4% Diabetes:              >6.4% Glycemic control for   <7.0% adults with diabetes     CBG: Recent Labs  Lab 02/07/21 1625  GLUCAP 298*    Review of Systems:   Unable to assess due to AMS  Past Medical History:  She,  has a past medical history of Coronary artery disease, Depression, Diabetes mellitus without complication ([place]), Elevated lipids, GERD (gastroesophageal reflux disease), Hypertension, Schizophrenia ([place]), and Syphilis (acquired).   Surgical History:   Past Surgical History:  Procedure Laterality Date   COLONOSCOPY     COLONOSCOPY WITH PROPOFOL N/A 09/01/2015   Procedure: COLONOSCOPY WITH PROPOFOL;  Surgeon: Hulen Luster, MD;  Location: [facility];  Service: Gastroenterology;  Laterality: N/A;   TUBAL LIGATION     wisdom teeth removal       Social History:   reports that she has been smoking cigarettes. She started smoking about 51 years ago. She has a 48.00 pack-year smoking history. She has never used smokeless tobacco. She reports current alcohol use of about 3.0 standard drinks per week. She reports that she does not currently use drugs after having used the following drugs: Marijuana.   Family History:  Her family history includes Alcoholism in her mother; Bone cancer in her father; Cancer in her mother; Diabetes in her maternal grandmother; Heart disease in her father; Suicidality in her paternal uncle. There is no history of Breast cancer.   Allergies No Known Allergies   Home Medications  Prior to Admission medications   Medication Sig Start Date End Date Taking? Authorizing Provider  amitriptyline (ELAVIL) 25 MG tablet Take 1 tablet (25 mg total) by mouth at bedtime. 10/24/17  Yes McNew, Tyson Babinski, MD   chlorproMAZINE (THORAZINE) 25 MG tablet Take 25 mg by mouth 2 (two) times daily. 09/05/20  Yes [provider]  cholecalciferol (VITAMIN D3) 25 MCG (1000 UNIT) tablet Take 1,000 Units by mouth daily.   Yes [provider]  glimepiride (AMARYL) 2 MG tablet Take 2 mg by mouth daily. 09/01/20  Yes [provider]  lovastatin (MEVACOR) 40 MG tablet Take 1 tablet (40 mg total) by mouth at bedtime. 12/20/17  Yes Juline Patch, MD  metFORMIN (GLUCOPHAGE) 1000 MG tablet Take 1,000 mg by mouth 2 (two) times daily.   Yes [provider]  metoprolol tartrate (LOPRESSOR) 25 MG tablet Take 1 tablet (25 mg total) by mouth 2 (two) times daily. 11/11/20  Yes Fritzi Mandes, MD  paliperidone (INVEGA) 6 MG 24 hr tablet Take 6 mg by mouth daily. 09/05/20  Yes [provider]  polyethylene glycol (MIRALAX / GLYCOLAX) 17 g packet Take  17 g by mouth daily.   Yes [provider]  senna-docusate (SENOKOT-S) 8.6-50 MG tablet Take 1 tablet by mouth at bedtime. 11/11/20  Yes Fritzi Mandes, MD  senna-docusate (SENOKOT-S) 8.6-50 MG tablet Take 1 tablet by mouth 2 (two) times daily. (0800 and 1600)   Yes [provider]  thiothixene (NAVANE) 2 MG capsule Take 1 capsule (2 mg total) by mouth 2 (two) times daily. 10/24/17  Yes McNew, Tyson Babinski, MD  vitamin C (ASCORBIC ACID) 500 MG tablet Take 500 mg by mouth 2 (two) times daily.   Yes [provider]  zinc gluconate 50 MG tablet Take 50 mg by mouth daily.   Yes [provider]     Critical care time: 60 minutes     Darel Hong, AGACNP-BC [facility] epic messenger for cross cover needs If after hours, please call E-link

## 2021-02-07 NOTE — Progress Notes (Signed)
PHARMACY CONSULT NOTE - FOLLOW UP  Pharmacy Consult for Electrolyte Monitoring and Replacement   Recent Labs: Potassium (mmol/L)  Date Value  02/07/2021 3.0 (L)   Magnesium (mg/dL)  Date Value  45/36/4680 3.1 (H)   Calcium (mg/dL)  Date Value  32/05/2481 8.5 (L)   Albumin (g/dL)  Date Value  50/08/7046 3.4 (L)   Sodium (mmol/L)  Date Value  02/07/2021 163 (HH)     Assessment: 70 yo female with PMH significant for HTN, DM, schizophrenia, and depression who was admitted with severe sepsis due to UTI and COVID-19 infection, acute metabolic encephalopathy in the setting of sepsis and severe hypernatremia, and acute kidney injury. Pharmacy has been consulted for electrolyte management.  Goal of Therapy:  Electrolytes WNL  Plan:  Na 166>163, Mg 3.1 - nephro consulted. Will defer management to nephro K 3.0 - will order IV Kcl 10 mEq x4 runs All other electrolytes WNL Follow up electrolytes with AM labs   Raiford Noble ,PharmD Clinical Pharmacist 02/07/2021 9:12 PM

## 2021-02-07 NOTE — ED Notes (Signed)
Pt return from CT.

## 2021-02-07 NOTE — Progress Notes (Addendum)
Pharmacy Antibiotic Note  Erica Mueller is a 70 y.o. female admitted on 02/07/2021 with AMS.  Pharmacy has been consulted for cefepime dosing for UTI. Pt received one time doses of vanc, cefepime, and metronidazole in the ED.  Plan: Cefepime 2 g IV q12 hours Monitor renal function and adjust as clinically indicated Follow up clinical picture/pt improvement/cultures and de-escalate as able  Height: 5\' 2"  (157.5 cm) Weight: 50 kg (110 lb 3.7 oz) IBW/kg (Calculated) : 50.1  Temp (24hrs), Avg:98.4 F (36.9 C), Min:98.4 F (36.9 C), Max:98.4 F (36.9 C)  Recent Labs  Lab 02/07/21 1516 02/07/21 1700 02/07/21 2039  WBC 17.2*  --   --   CREATININE 1.42*  --  1.14*  LATICACIDVEN 2.3* 3.6* 3.8*    Estimated Creatinine Clearance: 36.2 mL/min (A) (by C-G formula based on SCr of 1.14 mg/dL (H)).    No Known Allergies  Antimicrobials this admission: 8/30 metronidazole and vanc x1 8/30 cefepime >>   Dose adjustments this admission:   Microbiology results: 8/30 BCx: sent 8/30 UCx: sent   Thank you for allowing pharmacy to be a part of this patient's care.  9/30 Sharene Krikorian 02/07/2021 9:16 PM

## 2021-02-07 NOTE — Consult Note (Signed)
CODE SEPSIS - PHARMACY COMMUNICATION  **Broad Spectrum Antibiotics should be administered within 1 hour of Sepsis diagnosis**  Time Code Sepsis Called/Page Received: 1611  Antibiotics Ordered: 1604  Time of 1st antibiotic administration: 1625     Sharen Hones ,PharmD, BCPS Clinical Pharmacist  02/07/2021  4:10 PM

## 2021-02-07 NOTE — Progress Notes (Signed)
GOALS OF CARE DISCUSSION  The Clinical status was relayed to family in detail. BROTHER Erica Mueller Updated and notified of patients medical condition.    Patient remains unresponsive and will not open eyes to command.   Explained to family course of therapy and the modalities  ACTIVE COVID 19 INFECTION RENAL FAILURE HYPERGLYCEMIA HYPERNATREMIA  Patient with Progressive multiorgan failure with a very high probablity of a very minimal chance of meaningful recovery despite all aggressive and optimal medical therapy.  PATIENT REMAINS FULL CODE  Family understands the situation.    Family are satisfied with Plan of action and management. All questions answered  Additional CC time 35 mins   Erica Mueller [name], M.D.  [name]  Medical Director [facility] Medical Director [facility] Cardio-Pulmonary Department

## 2021-02-07 NOTE — Consult Note (Signed)
PHARMACY -  BRIEF ANTIBIOTIC NOTE   Pharmacy has received consult(s) for cefepime and vancomycin from an ED provider.  The patient's profile has been reviewed for ht/wt/allergies/indication/available labs.    One time order(s) placed for  Cefepime 2 gram Vancomycin 1000 mg    Further antibiotics/pharmacy consults should be ordered by admitting physician if indicated.                       Thank you, Sharen Hones, PharmD, BCPS Clinical Pharmacist   02/07/2021  4:11 PM

## 2021-02-07 NOTE — Sepsis Progress Note (Signed)
Sepsis protocol followed by eLink 

## 2021-02-07 NOTE — ED Notes (Signed)
Patient transported to CT 

## 2021-02-07 NOTE — ED Notes (Signed)
Pt BIBA from care facility for [facility] since yesterday. Pt GCS 5, extends from painful stimuli. + tachycardia, tachpynea, hypotensive.

## 2021-02-07 NOTE — Sepsis Progress Note (Signed)
Notified bedside nurse of need to draw repeat lactic acid. 

## 2021-02-07 NOTE — Sepsis Progress Note (Signed)
Sent secure chat to bedside RN to find out if third LA has been drawn.

## 2021-02-07 NOTE — ED Provider Notes (Signed)
[facility] Emergency Department Provider Note  ____________________________________________   Event Date/Time   First MD Initiated Contact with Patient 02/07/21 1519     (approximate)  I have reviewed the triage vital signs and the nursing notes.   HISTORY  Chief Complaint Altered Mental Status   HPI Erica Mueller is a 70 y.o. female medical history significant for HTN, DM, schizophrenia, depression and recent admission 5/30-6/3 for sepsis and metabolic encephalopathy secondary to urinary tract infection who presents from [facility] via EMS for assessment of altered mental status.  Looking at most recent note in the chart on 8 1 from palliative care consult seems patient at baseline requires assistance with bathing dressing toileting.  She is reportedly ambulatory at that time and was noted to be a full code on 8/1.  She reportedly answered yes to all questions and was not able to further engage in conversation.  Per EMS report it seems she became acutely altered today.  No history of recent falls or injuries or other clear sick symptoms.  No history is available from patient secondary to her being nonverbal on arrival.         Past Medical History:  Diagnosis Date   Coronary artery disease    Depression    Diabetes mellitus without complication (HCC)    Elevated lipids    GERD (gastroesophageal reflux disease)    Hypertension    Schizophrenia (HCC)    Syphilis (acquired)     Patient Active Problem List   Diagnosis Date Noted   Acute metabolic encephalopathy 11/07/2020   Severe sepsis (HCC) 11/07/2020   UTI (urinary tract infection) 11/07/2020   GERD (gastroesophageal reflux disease) 12/20/2017   Hyperlipemia 12/20/2017   History of schizophrenia 07/30/2017   Schizophrenia (HCC) 05/27/2017   Diabetes (HCC) 04/02/2017   Hypertension 04/02/2017   Noncompliance 04/02/2017   Depression 11/17/2013    Past Surgical History:  Procedure  Laterality Date   COLONOSCOPY     COLONOSCOPY WITH PROPOFOL N/A 09/01/2015   Procedure: COLONOSCOPY WITH PROPOFOL;  Surgeon: Wallace CullensPaul Y Oh, MD;  [facility];  Service: Gastroenterology;  Laterality: N/A;   TUBAL LIGATION     wisdom teeth removal      Prior to Admission medications   Medication Sig Start Date End Date Taking? Authorizing Provider  amitriptyline (ELAVIL) 25 MG tablet Take 1 tablet (25 mg total) by mouth at bedtime. 10/24/17  Yes McNew, Ileene HutchinsonHolly R, MD  chlorproMAZINE (THORAZINE) 25 MG tablet Take 25 mg by mouth 2 (two) times daily. 09/05/20  Yes [provider]  cholecalciferol (VITAMIN D3) 25 MCG (1000 UNIT) tablet Take 1,000 Units by mouth daily.   Yes [provider]  glimepiride (AMARYL) 2 MG tablet Take 2 mg by mouth daily. 09/01/20  Yes [provider]  lovastatin (MEVACOR) 40 MG tablet Take 1 tablet (40 mg total) by mouth at bedtime. 12/20/17  Yes Duanne LimerickJones, Deanna C, MD  metFORMIN (GLUCOPHAGE) 1000 MG tablet Take 1,000 mg by mouth 2 (two) times daily.   Yes [provider]  metoprolol tartrate (LOPRESSOR) 25 MG tablet Take 1 tablet (25 mg total) by mouth 2 (two) times daily. 11/11/20  Yes Enedina FinnerPatel, Sona, MD  paliperidone (INVEGA) 6 MG 24 hr tablet Take 6 mg by mouth daily. 09/05/20  Yes [provider]  polyethylene glycol (MIRALAX / GLYCOLAX) 17 g packet Take 17 g by mouth daily.   Yes [provider]  senna-docusate (SENOKOT-S) 8.6-50 MG tablet Take  1 tablet by mouth at bedtime. 11/11/20  Yes [name], MD  senna-docusate (SENOKOT-S) 8.6-50 MG tablet Take 1 tablet by mouth 2 (two) times daily. (0800 and 1600)   Yes [provider]  thiothixene (NAVANE) 2 MG capsule Take 1 capsule (2 mg total) by mouth 2 (two) times daily. 10/24/17  Yes McNew, Ileene Hutchinson, MD  vitamin C (ASCORBIC ACID) 500 MG tablet Take 500 mg by mouth 2 (two) times daily.   Yes [provider]  zinc gluconate 50 MG tablet Take 50 mg by mouth  daily.   Yes [provider]    Allergies Patient has no known allergies.  Family History  Problem Relation Age of Onset   Cancer Mother    Alcoholism Mother    Bone cancer Father    Heart disease Father    Diabetes Maternal Grandmother    Suicidality Paternal Uncle    Breast cancer Neg Hx     Social History Social History   Tobacco Use   Smoking status: Every Day    Packs/day: 1.00    Years: 48.00    Pack years: 48.00    Types: Cigarettes    Start date: 12/20/1969   Smokeless tobacco: Never  Vaping Use   Vaping Use: Never used  Substance Use Topics   Alcohol use: Yes    Alcohol/week: 3.0 standard drinks    Types: 2 Cans of beer, 1 Shots of liquor per week   Drug use: Not Currently    Types: Marijuana    Comment: PAST     Review of Systems  Review of Systems  Unable to perform ROS: Patient nonverbal     ____________________________________________   PHYSICAL EXAM:  VITAL SIGNS: ED Triage Vitals [02/07/21 1537]  Enc Vitals Group     BP 100/67     Pulse Rate (!) 127     Resp (!) 44     Temp 98.4 F (36.9 C)     Temp src      SpO2 91 %     Weight      Height      Head Circumference      Peak Flow      Pain Score      Pain Loc      Pain Edu?      Excl. in GC?    Vitals:   02/07/21 1730 02/07/21 1800  BP: (!) 143/50 (!) 161/63  Pulse: 94 (!) 102  Resp: (!) 24 (!) 25  Temp:    SpO2: 97% 98%   Physical Exam Vitals and nursing note reviewed.  Constitutional:      General: She is in acute distress.     Appearance: She is well-developed. She is ill-appearing.  HENT:     Head: Normocephalic and atraumatic.     Right Ear: External ear normal.     Left Ear: External ear normal.     Nose: Nose normal.     Mouth/Throat:     Mouth: Mucous membranes are dry.  Eyes:     Conjunctiva/sclera: Conjunctivae normal.  Cardiovascular:     Rate and Rhythm: Regular rhythm. Tachycardia present.     Heart sounds: No murmur heard. Pulmonary:      Effort: Pulmonary effort is normal. No respiratory distress.     Breath sounds: Normal breath sounds.  Abdominal:     Palpations: Abdomen is soft.     Tenderness: There is no abdominal tenderness.  Musculoskeletal:  Cervical back: Neck supple.     Right lower leg: No edema.     Left lower leg: No edema.  Skin:    General: Skin is warm and dry.     Capillary Refill: Capillary refill takes more than 3 seconds.  Neurological:     Mental Status: She is alert.     GCS: GCS eye subscore is 4. GCS verbal subscore is 1. GCS motor subscore is 4.     Comments: Pupils equal and symmetric reactive to light.  However patient does not track this examiner.  She does not follow any commands or participate in interview.     ____________________________________________   LABS (all labs ordered are listed, but only abnormal results are displayed)  Labs Reviewed  RESP PANEL BY RT-PCR (FLU A&B, COVID) ARPGX2 - Abnormal; Notable for the following components:      Result Value   SARS Coronavirus 2 by RT PCR POSITIVE (*)    All other components within normal limits  BLOOD GAS, VENOUS - Abnormal; Notable for the following components:   pH, Ven 7.44 (*)    pCO2, Ven 42 (*)    pO2, Ven 54.0 (*)    Bicarbonate 28.5 (*)    Acid-Base Excess 3.9 (*)    All other components within normal limits  MAGNESIUM - Abnormal; Notable for the following components:   Magnesium 3.1 (*)    All other components within normal limits  LACTIC ACID, PLASMA - Abnormal; Notable for the following components:   Lactic Acid, Venous 2.3 (*)    All other components within normal limits  LACTIC ACID, PLASMA - Abnormal; Notable for the following components:   Lactic Acid, Venous 3.6 (*)    All other components within normal limits  COMPREHENSIVE METABOLIC PANEL - Abnormal; Notable for the following components:   Sodium 166 (*)    Chloride 126 (*)    Glucose, Bld 360 (*)    BUN 88 (*)    Creatinine, Ser 1.42 (*)    Total  Protein 8.2 (*)    Albumin 3.4 (*)    AST 14 (*)    GFR, Estimated 40 (*)    All other components within normal limits  CBC WITH DIFFERENTIAL/PLATELET - Abnormal; Notable for the following components:   WBC 17.2 (*)    Hemoglobin 15.1 (*)    HCT 50.5 (*)    MCV 100.4 (*)    MCHC 29.9 (*)    Neutro Abs 15.3 (*)    Abs Immature Granulocytes 0.11 (*)    All other components within normal limits  PROTIME-INR - Abnormal; Notable for the following components:   Prothrombin Time 18.1 (*)    INR 1.5 (*)    All other components within normal limits  URINALYSIS, COMPLETE (UACMP) WITH MICROSCOPIC - Abnormal; Notable for the following components:   Color, Urine YELLOW (*)    APPearance CLOUDY (*)    Glucose, UA 50 (*)    Hgb urine dipstick MODERATE (*)    Ketones, ur 5 (*)    Protein, ur 30 (*)    Leukocytes,Ua MODERATE (*)    Bacteria, UA RARE (*)    All other components within normal limits  CBG MONITORING, ED - Abnormal; Notable for the following components:   Glucose-Capillary 298 (*)    All other components within normal limits  CULTURE, BLOOD (SINGLE)  URINE CULTURE  CULTURE, BLOOD (SINGLE)  PROCALCITONIN  APTT  TSH  HEMOGLOBIN A1C  D-DIMER, QUANTITATIVE  BASIC  METABOLIC PANEL   ____________________________________________  EKG  ____________________________________________  RADIOLOGY  ED MD interpretation: Chest x-ray shows no focal consolidation, effusion, pneumothorax or other acute thoracic process.  CT head shows no evidence of SAH or subacute CVA.  There are some chronic microvascular ischemic changes and cerebellar volume loss.  There is also some mild sinusitis.  Official radiology report(s): CT HEAD WO CONTRAST ( )  Result Date: 02/07/2021 CLINICAL DATA:  Mental status change, unknown cause EXAM: CT HEAD WITHOUT CONTRAST TECHNIQUE: Contiguous axial images were obtained from the base of the skull through the vertex without intravenous contrast. COMPARISON:   12/14/2020 FINDINGS: Brain: No evidence of acute infarction, hemorrhage, hydrocephalus, extra-axial collection or mass lesion/mass effect. Mild-moderate low-density changes within the periventricular and subcortical white matter compatible with chronic microvascular ischemic change. Mild diffuse cerebral volume loss. Vascular: Atherosclerotic calcifications involving the large vessels of the skull base. No unexpected hyperdense vessel. Skull: Normal. Negative for fracture or focal lesion. Sinuses/Orbits: Right maxillary sinus mucosal thickening with air-fluid level. Partial opacification of the bilateral mastoid air cells. Other: None. IMPRESSION: 1. No acute intracranial findings. 2. Chronic microvascular ischemic change and cerebral volume loss. 3. Chronic right maxillary sinusitis. 4. Partial opacification of the bilateral mastoid air cells. Electronically Signed   By: Duanne Guess D.O.   On: 02/07/2021 16:40   DG Chest Port 1 View  Result Date: 02/07/2021 CLINICAL DATA:  Questionable sepsis in a 70 year old female. EXAM: PORTABLE CHEST 1 VIEW COMPARISON:  Nov 07, 2020. FINDINGS: EKG leads projecting over the chest. Cardiomediastinal contours and hilar structures are normal. Lungs are clear. No sign of effusion or visible pneumothorax. IMPRESSION: No acute cardiopulmonary process. Electronically Signed   By: Donzetta Kohut M.D.   On: 02/07/2021 16:17    ____________________________________________   PROCEDURES  Procedure(s) performed (including Critical Care):  .Critical Care  Date/Time: 02/07/2021 5:09 PM Performed by: Gilles Chiquito, MD Authorized by: Gilles Chiquito, MD   Critical care provider statement:    Critical care time (minutes):  75   Critical care was necessary to treat or prevent imminent or life-threatening deterioration of the following conditions:  Sepsis, respiratory failure, metabolic crisis and dehydration   Critical care was time spent personally by me on the  following activities:  Discussions with consultants, evaluation of patient's response to treatment, examination of patient, ordering and performing treatments and interventions, ordering and review of laboratory studies, ordering and review of radiographic studies, pulse oximetry, re-evaluation of patient's condition, obtaining history from patient or surrogate and review of old charts   I assumed direction of critical care for this patient from another provider in my specialty: no     Care discussed with: admitting provider     ____________________________________________   INITIAL IMPRESSION / ASSESSMENT AND PLAN / ED COURSE      Patient presents with above-stated history exam for assessment of altered mental status with seemingly fairly significant functional limitations at baseline not seemingly able to engage in conversation and requiring significant assistance with ADLs.  On arrival her eyes are open and she does withdraw from pain but does not otherwise participate in interview or exam.  She appears extremely dry and is tachycardic and tachypneic with borderline sats at 91%.  This improved 94% on 2 L nasal cannula.  She is otherwise afebrile and BP is 100/67  Initial differential is quite broad and includes CVA, encephalopathy secondary to acute infectious process or significant metabolic derangement, SAH and significant endocrine derangement.  No clear  exam findings suggest acute trauma and I have a low suspicion for toxic ingestion.  Per report and review of MAR does not seem patient was recently started on new medication.  Patient was reportedly hyperglycemic with EMS with sugars in the 400s.  ABG on arrival with a pH of 7.44 with a PCO2 of 42 and a bicarb of 28.5.  Magnesium is elevated 3.1.  Lactic acid is elevated 2.3.  CBC with leukocytosis with WBC count of 17.2, hemoglobin of 15.1 and platelets of 159.  AP TT is 34.  INR is 1.5.  Sh is WNL.  CMP remarkable for sodium of 166,  chloride of 126, glucose of 360 and a BUN of 88 with a creatinine of 1.42 compared to 0.543 months ago.  There is no evidence of transaminitis or cholestasis.  Otherwise unremarkable.  Given tachycardia and tachypnea on arrival with concern for acute infectious process patient made code sepsis.  Blood and urine cultures ordered as well as 2 L of IV fluid to be bolused and broad-spectrum antibiotics to cover sepsis from unknown source.  I will plan to trend lactic acid and follow response to IV fluid resuscitation.  Patient was also very dehydrated with nearly 4.2 L free deficit and hyperglycemic without evidence of DKA.  We will start her on sliding scale insulin while hydrating giving antibiotics.  UA is concerning for urinary source of infection with moderate leukocyte esterase and 21-50 WBCs with some bacteria noted.  Chest x-ray shows no focal consolidation, effusion, pneumothorax or other acute thoracic process.  CT head shows no evidence of SAH or subacute CVA.  There are some chronic microvascular ischemic changes and cerebellar volume loss.  There is also some mild sinusitis.  Repeat lactic acid uptrending 3.6  We will send a D-dimer to assess risk for PE given no clear explanation on chest x-ray for hypoxia.  Given evidence of multiorgan failure i.e. encephalopathy, AKI and uptrending lactic with concern for sepsis from urinary source as well as evidence of respiratory failure requiring oxygen supplementation and GCS of 9 with concern for further decline I discussed with ICU admitting attending Dr. Belia Heman who will admit to ICU service.  ____________________________________________   FINAL CLINICAL IMPRESSION(S) / ED DIAGNOSES  Final diagnoses:  Altered mental status, unspecified altered mental status type  Sepsis, due to unspecified organism, unspecified whether acute organ dysfunction present (HCC)  Hypernatremia  Hyperglycemia  AKI (acute kidney injury) (HCC)  Lactic acid acidosis   Acute respiratory failure with hypoxia (HCC)    Medications  insulin aspart (novoLOG) injection 0-15 Units (8 Units Subcutaneous Given 02/07/21 1640)  lactated ringers infusion ( Intravenous New Bag/Given 02/07/21 1631)  vancomycin (VANCOCIN) IVPB 1000 mg/200 mL premix (1,000 mg Intravenous New Bag/Given 02/07/21 1705)  lactated ringers bolus 1,000 mL (has no administration in time range)  lactated ringers bolus 1,000 mL (1,000 mLs Intravenous New Bag/Given 02/07/21 1550)  ceFEPIme (MAXIPIME) 2 g in sodium chloride 0.9 % 100 mL IVPB (0 g Intravenous Stopped 02/07/21 1707)  metroNIDAZOLE (FLAGYL) IVPB 500 mg (500 mg Intravenous New Bag/Given 02/07/21 1658)     ED Discharge Orders     None        Note:  This document was prepared using Dragon voice recognition software and may include unintentional dictation errors.    Gilles Chiquito, MD 02/07/21 [phone]

## 2021-02-08 ENCOUNTER — Inpatient Hospital Stay (HOSPITAL_COMMUNITY)
Admit: 2021-02-08 | Discharge: 2021-02-08 | Disposition: A | Discharge status: Home / Self Care | Payer: Medicare Other | Attending: Pulmonary Disease | Admitting: Pulmonary Disease

## 2021-02-08 DIAGNOSIS — A419 Sepsis, unspecified organism: Secondary | ICD-10-CM | POA: Diagnosis not present

## 2021-02-08 DIAGNOSIS — R791 Abnormal coagulation profile: Secondary | ICD-10-CM

## 2021-02-08 DIAGNOSIS — J9601 Acute respiratory failure with hypoxia: Secondary | ICD-10-CM

## 2021-02-08 DIAGNOSIS — E872 Acidosis: Secondary | ICD-10-CM | POA: Diagnosis not present

## 2021-02-08 LAB — BLOOD GAS, VENOUS
Acid-Base Excess: 5.8 mmol/L — ABNORMAL HIGH (ref 0.0–2.0)
Bicarbonate: 29.8 mmol/L — ABNORMAL HIGH (ref 20.0–28.0)
O2 Saturation: 96.4 %
Patient temperature: 37
pCO2, Ven: 40 mmHg — ABNORMAL LOW (ref 44.0–60.0)
pH, Ven: 7.48 — ABNORMAL HIGH (ref 7.250–7.430)
pO2, Ven: 79 mmHg — ABNORMAL HIGH (ref 32.0–45.0)

## 2021-02-08 LAB — TROPONIN I (HIGH SENSITIVITY): Troponin I (High Sensitivity): 147 ng/L (ref <18)

## 2021-02-08 LAB — BASIC METABOLIC PANEL
Anion gap: 5 (ref 5–15)
BUN: 48 mg/dL — ABNORMAL HIGH (ref 8–23)
CO2: 29 mmol/L (ref 22–32)
Calcium: 9.2 mg/dL (ref 8.9–10.3)
Chloride: 128 mmol/L — ABNORMAL HIGH (ref 98–111)
Creatinine, Ser: 0.85 mg/dL (ref 0.44–1.00)
GFR, Estimated: >60 mL/min (ref >60)
Glucose, Bld: 70 mg/dL (ref 70–99)
Potassium: 3.7 mmol/L (ref 3.5–5.1)
Sodium: 162 mmol/L (ref 135–145)

## 2021-02-08 LAB — CBC
HCT: 43.9 % (ref 36.0–46.0)
Hemoglobin: 12.9 g/dL (ref 12.0–15.0)
MCH: 29.1 pg (ref 26.0–34.0)
MCHC: 29.4 g/dL — ABNORMAL LOW (ref 30.0–36.0)
MCV: 98.9 fL (ref 80.0–100.0)
Platelets: 118 10*3/uL — ABNORMAL LOW (ref 150–400)
RBC: 4.44 MIL/uL (ref 3.87–5.11)
RDW: 14.2 % (ref 11.5–15.5)
WBC: 13.8 10*3/uL — ABNORMAL HIGH (ref 4.0–10.5)
nRBC: 0.2 % (ref 0.0–0.2)

## 2021-02-08 LAB — HEPATIC FUNCTION PANEL
ALT: 11 U/L (ref 0–44)
AST: 20 U/L (ref 15–41)
Albumin: 2.7 g/dL — ABNORMAL LOW (ref 3.5–5.0)
Alkaline Phosphatase: 46 U/L (ref 38–126)
Bilirubin, Direct: 0.1 mg/dL (ref 0.0–0.2)
Indirect Bilirubin: 0.5 mg/dL (ref 0.3–0.9)
Total Bilirubin: 0.6 mg/dL (ref 0.3–1.2)
Total Protein: 6.5 g/dL (ref 6.5–8.1)

## 2021-02-08 LAB — ECHOCARDIOGRAM COMPLETE
AR max vel: 2.24 cm2
AV Area VTI: 2.27 cm2
AV Area mean vel: 2.47 cm2
AV Mean grad: 3 mmHg
AV Peak grad: 7.1 mmHg
Ao pk vel: 1.33 m/s
Area-P 1/2: 6.02 cm2
Height: 62 in
MV VTI: 3.79 cm2
P 1/2 time: 362 msec
S' Lateral: 3.96 cm
Weight: 1763.68 oz

## 2021-02-08 LAB — STREP PNEUMONIAE URINARY ANTIGEN: Strep Pneumo Urinary Antigen: NEGATIVE

## 2021-02-08 LAB — PHOSPHORUS: Phosphorus: 1.6 mg/dL — ABNORMAL LOW (ref 2.5–4.6)

## 2021-02-08 LAB — GLUCOSE, CAPILLARY
Glucose-Capillary: 103 mg/dL — ABNORMAL HIGH (ref 70–99)
Glucose-Capillary: 121 mg/dL — ABNORMAL HIGH (ref 70–99)
Glucose-Capillary: 158 mg/dL — ABNORMAL HIGH (ref 70–99)
Glucose-Capillary: 160 mg/dL — ABNORMAL HIGH (ref 70–99)
Glucose-Capillary: 160 mg/dL — ABNORMAL HIGH (ref 70–99)
Glucose-Capillary: 161 mg/dL — ABNORMAL HIGH (ref 70–99)

## 2021-02-08 LAB — FIBRINOGEN: Fibrinogen: 479 mg/dL — ABNORMAL HIGH (ref 210–475)

## 2021-02-08 LAB — SODIUM
Sodium: 162 mmol/L (ref 135–145)
Sodium: 161 mmol/L (ref 135–145)
Sodium: 161 mmol/L (ref 135–145)
Sodium: 159 mmol/L — ABNORMAL HIGH (ref 135–145)

## 2021-02-08 LAB — C-REACTIVE PROTEIN
CRP: 5.8 mg/dL — ABNORMAL HIGH (ref <1.0)
CRP: 7.8 mg/dL — ABNORMAL HIGH (ref <1.0)

## 2021-02-08 LAB — CBG MONITORING, ED
Glucose-Capillary: 48 mg/dL — ABNORMAL LOW (ref 70–99)
Glucose-Capillary: 55 mg/dL — ABNORMAL LOW (ref 70–99)
Glucose-Capillary: 170 mg/dL — ABNORMAL HIGH (ref 70–99)

## 2021-02-08 LAB — PROCALCITONIN: Procalcitonin: <0.1 ng/mL

## 2021-02-08 LAB — MAGNESIUM: Magnesium: 2.3 mg/dL (ref 1.7–2.4)

## 2021-02-08 LAB — HEMOGLOBIN A1C
Hgb A1c MFr Bld: 7.9 % — ABNORMAL HIGH (ref 4.8–5.6)
Mean Plasma Glucose: 180 mg/dL

## 2021-02-08 LAB — LACTIC ACID, PLASMA: Lactic Acid, Venous: 1.9 mmol/L (ref 0.5–1.9)

## 2021-02-08 LAB — D-DIMER, QUANTITATIVE: D-Dimer, Quant: 16.39 ug/mL-FEU — ABNORMAL HIGH (ref 0.00–0.50)

## 2021-02-08 MED ORDER — POTASSIUM CHLORIDE 10 MEQ/100ML IV SOLN: 10.0000 meq | INTRAVENOUS | Status: DC

## 2021-02-08 MED ORDER — CHLORHEXIDINE GLUCONATE CLOTH 2 % EX PADS
6.0000 | MEDICATED_PAD | Freq: Every day | CUTANEOUS | Status: DC
  Administered 2021-02-08 – 2021-03-29 (×41): 6 via TOPICAL

## 2021-02-08 MED ORDER — DEXTROSE 50 % IV SOLN: 25.0000 g | Freq: Once | INTRAVENOUS | Status: AC

## 2021-02-08 MED ORDER — SODIUM CHLORIDE 0.9 % IV SOLN
100.0000 mg | Freq: Every day | INTRAVENOUS | Status: AC
  Administered 2021-02-09 – 2021-02-12 (×4): 100 mg via INTRAVENOUS
  Filled 2021-02-08 (×2): qty 20
  Filled 2021-02-08: qty 100
  Filled 2021-02-08: qty 20
  Filled 2021-02-08: qty 100

## 2021-02-08 MED ORDER — MORPHINE SULFATE (PF) 2 MG/ML IV SOLN
2.0000 mg | INTRAVENOUS | Status: DC | PRN
  Filled 2021-02-08: qty 1

## 2021-02-08 MED ORDER — DEXTROSE 5 % IV SOLN: INTRAVENOUS | Status: AC

## 2021-02-08 MED ORDER — ACETAMINOPHEN 650 MG RE SUPP
650.0000 mg | RECTAL | Status: DC | PRN
  Filled 2021-02-08: qty 1

## 2021-02-08 MED ORDER — DEXTROSE 50 % IV SOLN
INTRAVENOUS | Status: AC
  Administered 2021-02-08: 25 g via INTRAVENOUS
  Filled 2021-02-08: qty 50

## 2021-02-08 MED ORDER — SODIUM CHLORIDE 0.9 % IV SOLN
200.0000 mg | Freq: Once | INTRAVENOUS | Status: AC
  Administered 2021-02-08: 200 mg via INTRAVENOUS
  Filled 2021-02-08: qty 200

## 2021-02-08 NOTE — Consult Note (Signed)
Remdesivir - Pharmacy Brief Note   O:  ALT: 12 (BL 16 10/2020) CT Chest: Emphysema with partial consolidations in the right upper lobe and right lower lobe concerning for pneumonia and or aspiration SpO2: ORA >2L  94-99%    A/P:  Remdesivir 200 mg IVPB once followed by 100 mg IVPB daily x 4 days.   Martyn Malay, [facility] 02/08/2021 12:37 PM

## 2021-02-08 NOTE — Consult Note (Signed)
[facility] Consult Note: 02/08/21     Date of Admission:  02/07/2021           Reason for Consult: Hyponatremia    Referring Provider: Erin Fulling, MD Primary Care Provider: [facility]   History of Presenting Illness:  Erica Mueller is a 70 y.o. female  Patient is brought in by EMS from [facility] for altered mental status.  Upon arrival blood sugar was 406.  Patient was also noted to have new onset tremors.  Patient is not able to provide any meaningful information.  All information is obtained from the chart and primary team.  Work-up in the emergency room showed high sodium level of 166.  Patient was started on D5W.  Currently at 75 cc/h.  Nephrology consult has been requested to evaluate for management of hyponatremia.  Sodium level of 162 this morning.  In addition patient is also noted to have acute kidney injury.  Baseline creatinine of 0.49 from May 31.  Presenting creatinine of 1.42 which has improved to 0.85 with IV fluids and hydration.  Review of Systems: ROS unavailable because patient is very lethargic  Past Medical History:  Diagnosis Date   Coronary artery disease    Depression    Diabetes mellitus without complication (HCC)    Elevated lipids    GERD (gastroesophageal reflux disease)    Hypertension    Schizophrenia (HCC)    Syphilis (acquired)     Social History   Tobacco Use   Smoking status: Every Day    Packs/day: 1.00    Years: 48.00    Pack years: 48.00    Types: Cigarettes    Start date: 12/20/1969   Smokeless tobacco: Never  Vaping Use   Vaping Use: Never used  Substance Use Topics   Alcohol use: Yes    Alcohol/week: 3.0 standard drinks    Types: 2 Cans of beer, 1 Shots of liquor per week   Drug use: Not Currently    Types: Marijuana    Comment: PAST     Family History  Problem Relation Age of Onset   Cancer Mother    Alcoholism Mother    Bone cancer Father    Heart disease Father     Diabetes Maternal Grandmother    Suicidality Paternal Uncle    Breast cancer Neg Hx      OBJECTIVE: Blood pressure (!) 169/56, pulse (!) 103, temperature 98.5 F (36.9 C), temperature source Oral, resp. rate 16, height 5\' 2"  (1.575 m), weight 50 kg, SpO2 98 %.  Physical Exam  General appearance: Thin, cachectic appearing HEENT: Temporal wasting, dry oral mucous membranes Neck: No masses Cardiac: No rub, irregular with ectopic beats Pulmonary: Normal breathing effort, [place] O2 Abdomen: Soft, nontender Extremities: No peripheral edema Neuro: Not following commands, appears very lethargic  Lab Results Lab Results  Component Value Date   WBC 13.8 (H) 02/08/2021   HGB 12.9 02/08/2021   HCT 43.9 02/08/2021   MCV 98.9 02/08/2021   PLT 118 (L) 02/08/2021    Lab Results  Component Value Date   CREATININE 0.85 02/08/2021   BUN 48 (H) 02/08/2021   NA 162 (HH) 02/08/2021   K 3.7 02/08/2021   CL 128 (H) 02/08/2021   CO2 29 02/08/2021    Lab Results  Component Value Date   ALT 12 02/07/2021   AST 14 (L) 02/07/2021   ALKPHOS 57 02/07/2021   BILITOT 1.0 02/07/2021     Microbiology: Recent  Results (from the past 240 hour(s))  Resp Panel by RT-PCR (Flu A&B, Covid) Nasopharyngeal Swab     Status: Abnormal   Collection Time: 02/07/21  3:16 PM   Specimen: Nasopharyngeal Swab; Nasopharyngeal(NP) swabs in vial transport medium  Result Value Ref Range Status   SARS Coronavirus 2 by RT PCR POSITIVE (A) NEGATIVE Final    Comment: RESULT CALLED TO, READ BACK BY AND VERIFIED WITH: MATT BASSETT 02/07/21 1644 AMK (NOTE) SARS-CoV-2 target nucleic acids are DETECTED.  The SARS-CoV-2 RNA is generally detectable in upper respiratory specimens during the acute phase of infection. Positive results are indicative of the presence of the identified virus, but do not rule out bacterial infection or co-infection with other pathogens not detected by the test. Clinical correlation with patient  history and other diagnostic information is necessary to determine patient infection status. The expected result is Negative.  Fact Sheet for Patients: BloggerCourse.com  Fact Sheet for Healthcare Providers: SeriousBroker.it  This test is not yet approved or cleared by the [place] FDA and  has been authorized for detection and/or diagnosis of SARS-CoV-2 by FDA under an Emergency Use Authorization (EUA).  This EUA will remain in effect (meaning this test can be use d) for the duration of  the COVID-19 declaration under Section 564(b)(1) of the Act, 21 U.S.C. section 360bbb-3(b)(1), unless the authorization is terminated or revoked sooner.     Influenza A by PCR NEGATIVE NEGATIVE Final   Influenza B by PCR NEGATIVE NEGATIVE Final    Comment: (NOTE) The Xpert Xpress SARS-CoV-2/FLU/RSV plus assay is intended as an aid in the diagnosis of influenza from Nasopharyngeal swab specimens and should not be used as a sole basis for treatment. Nasal washings and aspirates are unacceptable for Xpert Xpress SARS-CoV-2/FLU/RSV testing.  Fact Sheet for Patients: BloggerCourse.com  Fact Sheet for Healthcare Providers: SeriousBroker.it  This test is not yet approved or cleared by the [place] FDA and has been authorized for detection and/or diagnosis of SARS-CoV-2 by FDA under an Emergency Use Authorization (EUA). This EUA will remain in effect (meaning this test can be used) for the duration of the COVID-19 declaration under Section 564(b)(1) of the Act, 21 U.S.C. section 360bbb-3(b)(1), unless the authorization is terminated or revoked.  Performed at [facility], [address] [place], [place] [address]   Blood culture (routine single)     Status: None (Preliminary result)   Collection Time: 02/07/21  3:37 PM   Specimen: BLOOD  Result Value Ref Range Status    Specimen Description BLOOD RIGHT ANTECUBITAL  Final   Special Requests   Final    BOTTLES DRAWN AEROBIC AND ANAEROBIC Blood Culture adequate volume   Culture   Final    NO GROWTH < 24 HOURS Performed at [facility], [address], [place], [place] [address]    Report Status PENDING  Incomplete  Culture, blood (single)     Status: None (Preliminary result)   Collection Time: 02/07/21  3:50 PM   Specimen: BLOOD  Result Value Ref Range Status   Specimen Description BLOOD LEFT ASSIST CONTROL  Final   Special Requests   Final    BOTTLES DRAWN AEROBIC AND ANAEROBIC Blood Culture adequate volume   Culture   Final    NO GROWTH < 12 HOURS Performed at [facility], [address] [place], [place] [address]    Report Status PENDING  Incomplete    Medications: Scheduled Meds:  vitamin C  500 mg Oral Daily  Chlorhexidine Gluconate Cloth  6 each Topical Daily   heparin  5,000 Units Subcutaneous Q8H   insulin aspart  0-15 Units Subcutaneous Q4H   zinc sulfate  220 mg Oral Daily   Continuous Infusions:  ceFEPime (MAXIPIME) IV Stopped (02/08/21 0451)   dextrose 75 mL/hr at 02/08/21 0900   PRN Meds:.acetaminophen, docusate sodium, morphine injection, ondansetron (ZOFRAN) IV, polyethylene glycol  No Known Allergies  Urinalysis: Recent Labs    02/07/21 1650  COLORURINE YELLOW*  LABSPEC 1.026  PHURINE 5.0  GLUCOSEU 50*  HGBUR MODERATE*  BILIRUBINUR NEGATIVE  KETONESUR 5*  PROTEINUR 30*  NITRITE NEGATIVE  LEUKOCYTESUR MODERATE*      Imaging: CT HEAD WO CONTRAST ( )  Result Date: 02/07/2021 CLINICAL DATA:  Mental status change, unknown cause EXAM: CT HEAD WITHOUT CONTRAST TECHNIQUE: Contiguous axial images were obtained from the base of the skull through the vertex without intravenous contrast. COMPARISON:  12/14/2020 FINDINGS: Brain: No evidence of acute infarction, hemorrhage, hydrocephalus, extra-axial collection or mass lesion/mass effect.  Mild-moderate low-density changes within the periventricular and subcortical white matter compatible with chronic microvascular ischemic change. Mild diffuse cerebral volume loss. Vascular: Atherosclerotic calcifications involving the large vessels of the skull base. No unexpected hyperdense vessel. Skull: Normal. Negative for fracture or focal lesion. Sinuses/Orbits: Right maxillary sinus mucosal thickening with air-fluid level. Partial opacification of the bilateral mastoid air cells. Other: None. IMPRESSION: 1. No acute intracranial findings. 2. Chronic microvascular ischemic change and cerebral volume loss. 3. Chronic right maxillary sinusitis. 4. Partial opacification of the bilateral mastoid air cells. Electronically Signed   By: Duanne Guess D.O.   On: 02/07/2021 16:40   CT Angio Chest Pulmonary Embolism (PE) W or WO Contrast  Result Date: 02/07/2021 CLINICAL DATA:  Altered mental status EXAM: CT ANGIOGRAPHY CHEST WITH CONTRAST TECHNIQUE: Multidetector CT imaging of the chest was performed using the standard protocol during bolus administration of intravenous contrast. Multiplanar CT image reconstructions and MIPs were obtained to evaluate the vascular anatomy. CONTRAST:  60mL OMNIPAQUE IOHEXOL 350 MG/ML SOLN COMPARISON:  Chest x-ray 02/07/2021 FINDINGS: Cardiovascular: Slightly limited evaluation secondary to motion degradation. No definite acute pulmonary embolus is visualized. Aorta is nonaneurysmal. Moderate atherosclerosis. Coronary vascular calcification. Normal cardiac size. No pericardial effusion Mediastinum/Nodes: Midline trachea. No thyroid mass. No suspicious adenopathy. Esophagus within normal limits Lungs/Pleura: Emphysema. Partial consolidation within the posterior right upper lobe and the right lower lobe. No pleural effusion or pneumothorax. Upper Abdomen: No acute abnormality. Musculoskeletal: No chest wall abnormality. No acute or significant osseous findings. Review of the MIP  images confirms the above findings. IMPRESSION: 1. No definite acute pulmonary embolus identified allowing for mild motion degradation. 2. Emphysema with partial consolidations in the right upper lobe and right lower lobe concerning for pneumonia and or aspiration. Aortic Atherosclerosis (ICD10-I70.0) and Emphysema (ICD10-J43.9). Electronically Signed   By: Jasmine Pang M.D.   On: 02/07/2021 23:22   US Venous Img Lower Bilateral (DVT)  Result Date: 02/08/2021 CLINICAL DATA:  Bilateral lower extremity edema. EXAM: BILATERAL LOWER EXTREMITY VENOUS DOPPLER ULTRASOUND TECHNIQUE: Gray-scale sonography with compression, as well as color and duplex ultrasound, were performed to evaluate the deep venous system(s) from the level of the common femoral vein through the popliteal and proximal calf veins. COMPARISON:  None. FINDINGS: VENOUS Normal compressibility of the common femoral, superficial femoral, and popliteal veins, as well as the visualized calf veins. Visualized portions of profunda femoral vein and great saphenous vein unremarkable. No filling defects to suggest DVT on grayscale or color  Doppler imaging. Doppler waveforms show normal direction of venous flow, normal respiratory plasticity and response to augmentation. Limited views of the contralateral common femoral vein are unremarkable. OTHER None. Limitations: none IMPRESSION: Negative. Electronically Signed   By: Aram Candela M.D.   On: 02/08/2021 00:43   DG Chest Port 1 View  Result Date: 02/07/2021 CLINICAL DATA:  Questionable sepsis in a 70 year old female. EXAM: PORTABLE CHEST 1 VIEW COMPARISON:  Nov 07, 2020. FINDINGS: EKG leads projecting over the chest. Cardiomediastinal contours and hilar structures are normal. Lungs are clear. No sign of effusion or visible pneumothorax. IMPRESSION: No acute cardiopulmonary process. Electronically Signed   By: Donzetta Kohut M.D.   On: 02/07/2021 16:17      Assessment/Plan:  JIYA KISSINGER is a 70  y.o. female with medical problems of hypertension, diabetes, schizophrenia, depression, recent admission for sepsis in May 2022, emphysema, aortic atherosclerosis was admitted on 02/07/2021 for : Edema [R60.9] Hypernatremia [E87.0] Lactic acid acidosis [E87.2] Hyperglycemia [R73.9] Acute respiratory failure with hypoxia (HCC) [J96.01] AKI (acute kidney injury) (HCC) [N17.9] Sepsis (HCC) [A41.9] Altered mental status, unspecified altered mental status type [R41.82] Sepsis, due to unspecified organism, unspecified whether acute organ dysfunction present (HCC) [A41.9]  #Hypernatremia #Acute kidney injury #COVID-positive-no respiratory symptoms #Urinary tract infection/cystitis.  Cultures pending #Emphysema, consolidation in right upper lobe and right lower lobe concerning for aspiration pneumonia  Hyponatremia and acute kidney injury likely secondary to volume depletion/dehydration.  Perhaps underlying UTI and aspiration pneumonia may be the cause of altered mental status and acute illness.  Currently getting treatment with broad-spectrum IV antibiotics.  Agree with D5W for correction of hypernatremia.  Recommend rate of correction between 8 to 12 mEq per 24 hours. Serum creatinine is improving  Thank you for allowing [place] to participate in the care of this patient.  Please reconsult as necessary.  Tykee Heideman Thedore Mins 02/08/21

## 2021-02-08 NOTE — Progress Notes (Signed)
Updated patient's brother Erica Mueller and patient's sister via telephone.  We discussed her overall clinical picture including severe sepsis and acute metabolic encephalopathy in the setting of UTI, suspected pneumonia versus aspiration, COVID-19 infection, along with acute kidney injury and severe hypernatremia due to dehydration.  Discussed that her acute kidney injury is improving, and that mental status is slowly improving.  However she remains critically ill and prognosis is guarded.  All questions answered.  They are very appreciative of update. Remains full code.  Of note they report that the patient tested positive for COVID 2 weeks ago, and has steadily declined since then.  Prior to COVID infection she was able to eat and drink for herself, ambulate, and would communicate & answer some questions.   Harlon Ditty, AGACNP-BC [facility] Prefer epic messenger for cross cover needs If after hours, please call E-link

## 2021-02-08 NOTE — Progress Notes (Signed)
*  PRELIMINARY RESULTS* Echocardiogram 2D Echocardiogram has been performed.  Erica Mueller [name] 02/08/2021, 10:46 AM

## 2021-02-08 NOTE — Progress Notes (Addendum)
 NAME:  Erica Mueller, MRN:  [id number], DOB:  08/14/1950, LOS: 1 ADMISSION DATE:  02/07/2021, CONSULTATION DATE:  02/07/2021 REFERRING MD:  Dr. Smith, CHIEF COMPLAINT:  Altered Mental Status   Brief Pt Description / Synopsis:  70-year-old female admitted with severe sepsis due to UTI and COVID-19 infection, acute metabolic encephalopathy in the setting of sepsis and severe hyponatremia, and acute kidney injury.  History of Present Illness:  Erica Mueller is a 70-year-old female with a past medical history significant for hypertension, diabetes mellitus, schizophrenia, and depression who presented to [facility] ED on 02/07/2021 from [facility] due to altered mental status.  Patient currently remains altered and no family is available, therefore history is obtained from ED and nursing notes.  Per notes EMS was dispatched as she became acutely altered today.  No history of recent falls or injuries or other symptoms.  Of note she was recently admitted from 11/07/2020 through 11/11/2020 for sepsis and acute metabolic encephalopathy due to urinary tract infection.  ED Course: Upon arrival to the ED she would open her eyes and withdraw from pain, but otherwise unable to participate in interview or exam.  She appeared extremely dehydrated and was tachycardic, tachypneic, and hypoxic with O2 sats 91% on room air. Initial vital signs: Temperature 98.4, RR 44, pulse 127, blood pressure 100/67, SPO2 91% on room air Labs: Sodium 166, chloride 126, glucose 360, BUN 88, creatinine 1.42, lactic acid 2.3, procalcitonin 0.12, WBC 17.2 with neutrophilia, D-dimer greater than 20 Venous blood gas: pH 7.44/PCO2 42/PO2 54/bicarb 20.5 Urinalysis consistent with UTI Positive for COVID-19 (CT time 30.2) Imaging: Chest x-ray>>No acute cardiopulmonary process. CT head without contrast>>1. No acute intracranial findings. 2. Chronic microvascular ischemic change and cerebral volume loss. 3. Chronic right maxillary  sinusitis. 4. Partial opacification of the bilateral mastoid air cells.  She met sepsis criteria therefore she was given 2 L of IV fluid boluses and broad-spectrum antibiotics including Flagyl, cefepime, vancomycin.  PCCM is asked to admit the patient to stepdown unit for further work-up and treatment of severe sepsis due to UTI and COVID-19 infection, acute metabolic encephalopathy in the setting of sepsis and severe hypernatremia, and acute kidney injury.  Pertinent  Medical History  Coronary artery disease Hypertension Hyperlipidemia Diabetes mellitus Schizophrenia Depression Syphilis  Micro Data:  02/07/2021: SARS-CoV-2 PCR>> positive 02/07/2021: Influenza PCR>> negative 02/07/2021: Blood culture>> 02/07/2021: Urine>> 02/08/2021: Strep pneumo urinary antigen>> 02/08/2021: Legionella urinary antigen>> 02/08/2021: Sputum>>  Antimicrobials:  Flagyl 8/30 x 1 dose Vancomycin 8/30 x 1 dose Cefepime 8/30>> Remdesivir 8/31>>  Significant Hospital Events: Including procedures, antibiotic start and stop dates in addition to other pertinent events   02/07/2021: Presented to ED with altered mental status, to be admitted to stepdown.  Nephrology consulted for severe hyponatremia. 02/08/2021: Remains severely HYPERnatremic, add D5 infusion and follow serum sodium every 4 hours.  CTA Chest negative for PE, concerning for Pneumonia vs aspiration. Start Remdesivir  Interim History / Subjective:  -No acute events reported overnight -CTA chest last night negative for PE, concern for possible pneumonia in right upper and lower lobes -Venous US negative for DVT -Afebrile, hemodynamically stable, no vasopressors, on 2 L nasal cannula -Remains somnolent, ABG virtually unchanged, protecting airway ~discussed with Dr. Kasa will hold off on intubation for now -Serum sodium 162 (163 previously) ~we will add D5 at 75 and follow serum sodium every 4 hours ~nephrology is in agreement with plan (goal  correction of sodium is 10 mEq per 24 hours) -Creatinine normalized this   morning, urine output 780 mL last 24 hours -WBC improved to 13.8 K (17 K previously) -Critically ill, high risk for intubation, cardiac arrest, death  Objective   Blood pressure (!) 169/56, pulse (!) 103, temperature 98.5 F (36.9 C), temperature source Oral, resp. rate 16, height 5' 2" (1.575 m), weight 50 kg, SpO2 98 %.        Intake/Output Summary (Last 24 hours) at 02/08/2021 1055 Last data filed at 02/08/2021 0900 Gross per 24 hour  Intake 1331.98 ml  Output 700 ml  Net 631.98 ml   Filed Weights   02/07/21 1842  Weight: 50 kg    Examination: General: Acute on chronically ill-appearing female, laying in bed, somnolent, no acute distress HENT: Atraumatic, normocephalic, neck supple, no JVD Lungs: Unable to assess due to CAPR, even, nonlabored, no accessory muscle use Cardiovascular:  regular rate and rhythm, 2+ distal pulses Abdomen: Soft, nontender, nondistended, no guarding rebound tenderness Extremities: Withdraws from pain to all extremities, contractured, no edema Neuro: Somnolent, withdraws from pain, does not follow commands, only moans and groans, pupils PERRLA GU: Foley catheter in place draining yellow urine  Resolved Hospital Problem list   N/A  Assessment & Plan:   Severe Sepsis due to UTI & COVID-19 (no respiratory symptoms) ? Healthcare Associated Pneumonia vs Aspiration -Monitor fever curve -Trend WBC's & Procalcitonin -Follow cultures as above -Continue empiric Cefepime pending cultures & sensitivities -CT time for COVID is 30.2, indicative of Active infection -Discussed with Dr. Kasa, pt with no respiratory symptoms, but is at high risk for advancement to severe disease, therefore will start Remdesivir (plan for 3 day course) -Follow inflammatory markers: Ferritin, D-dimer, CRP -Vitamin C, zinc -Maintain Airborne & Contact precautions  Tachycardia due to sepsis and  dehydration Mildly elevated Troponin, suspect demand ischemia Elevated D-dimer PMHx of HTN, HLD -Continuous cardiac monitoring -Maintain MAP >65 -IV fluids -Vasopressors as needed to maintain MAP goal -Lactic acid now normalized (2.3 ~ 3.6 ~3.8 ~ 1.9) -Trend HS Troponin until peaked (144 ~ 176 ~) -Echocardiogram pending -Will hold home antihypertensives for now given potential for development of septic shock -CTA Chest negative for PE, Venous US of BLE negative for DVT  Hypernatremia, suspect due to dehydration Acute Kidney Injury>>RESOLVED -Monitor I&O's / urinary output -Follow BMP -Ensure adequate renal perfusion -Avoid nephrotoxic agents as able -Replace electrolytes as indicated -Start D5 @ 75 ml/hr -Follow serum Na+ q4h -Goal rate of Na+ correction is 10 mEq in 24 hrs  -Nephrology consulted, appreciate input  Acute Metabolic Encephalopathy in setting of Sepsis, UTI, and Hypernatremia PMHx of Schizophrenia, depression -Treat sepsis and Hypernatremia -Avoid sedating medications as able -Provide supportive care -CT Head negative 02/07/21 -VBG without hypoxia or hypercapnia  Acute Hypoxic Respiratory Failure, suspect due to Sepsis and multiple metabolic derangements ? HCAP vs Aspiration -Supplemental O2 as needed to maintain O2 sats >92% -High risk for intubation -Follow intermittent Chest X-ray & ABG as needed -Prn Bronchodilators -CTA Chest 02/08/21 is negative for PE, shows partial consolidations in RUL & RLL concerning for Pneumonia and/or aspiration  -Continue ABX as above  Mild Thrombocytopenia, suspect due to Sepsis -Monitor for S/Sx of bleeding -Trend CBC -Heparin SQ for VTE Prophylaxis  -Transfuse for Hgb <7 -Transfuse platelets for platelet count <50 with active bleeding  Diabetes Mellitus -CBG's q4h; Target range of 140 to 180 -SSI -Follow ICU Hypo/Hyperglycemia protocol   Pt is critically ill, prognosis is guarded.  High risk for intubation, cardiac  arrest and death.  Recommend DNR/DNI   status.  Will consult Palliative Care.   Best Practice (right click and "Reselect all SmartList Selections" daily)   Diet/type: NPO DVT prophylaxis: prophylactic heparin  GI prophylaxis: PPI Lines: N/A Foley:  Yes, and it is still needed Code Status:  full code Last date of multidisciplinary goals of care discussion [02/08/21]  Dr. Mortimer Fries updated pts' brother Sheral Flow via telephone 02/07/21.  Labs   CBC: Recent Labs  Lab 02/07/21 1516 02/08/21 0405  WBC 17.2* 13.8*  NEUTROABS 15.3*  --   HGB 15.1* 12.9  HCT 50.5* 43.9  MCV 100.4* 98.9  PLT 159 118*     Basic Metabolic Panel: Recent Labs  Lab 02/07/21 1516 02/07/21 2039 02/08/21 0405 02/08/21 0918  NA 166* 163* 162* 162*  K 3.5 3.0* 3.7  --   CL 126* 128* 128*  --   CO2 _0 --   GLUCOSE 360* 240* 70  --   BUN 88* 62* 48*  --   CREATININE 1.42* 1.14* 0.85  --   CALCIUM 9.8 8.5* 9.2  --   MG 3.1*  --  2.3  --   PHOS  --   --  1.6*  --     GFR: Estimated Creatinine Clearance: 48.6 mL/min (by C-G formula based on SCr of 0.85 mg/dL). Recent Labs  Lab 02/07/21 1516 02/07/21 1700 02/07/21 2039 02/08/21 0315 02/08/21 0405  PROCALCITON 0.12  --   --   --  <0.10  WBC 17.2*  --   --   --  13.8*  LATICACIDVEN 2.3* 3.6* 3.8* 1.9  --      Liver Function Tests: Recent Labs  Lab 02/07/21 1516  AST 14*  ALT 12  ALKPHOS 57  BILITOT 1.0  PROT 8.2*  ALBUMIN 3.4*    No results for input(s): LIPASE, AMYLASE in the last 168 hours. No results for input(s): AMMONIA in the last 168 hours.  ABG    Component Value Date/Time   HCO3 29.8 (H) 02/08/2021 0918   O2SAT 96.4 02/08/2021 0918      Coagulation Profile: Recent Labs  Lab 02/07/21 1516  INR 1.5*     Cardiac Enzymes: No results for input(s): CKTOTAL, CKMB, CKMBINDEX, TROPONINI in the last 168 hours.  HbA1C: Hgb A1c MFr Bld  Date/Time Value Ref Range Status  02/07/2021 03:37 PM 7.9 (H) 4.8 - 5.6 %  Final    Comment:    (NOTE)         Prediabetes: 5.7 - 6.4         Diabetes: >6.4         Glycemic control for adults with diabetes: <7.0   11/08/2020 07:16 AM 6.7 (H) 4.8 - 5.6 % Final    Comment:    (NOTE)         Prediabetes: 5.7 - 6.4         Diabetes: >6.4         Glycemic control for adults with diabetes: <7.0     CBG: Recent Labs  Lab 02/08/21 0358 02/08/21 0410 02/08/21 0437 02/08/21 0638 02/08/21 0827  GLUCAP 48* 55* 170* 160* 158*     Review of Systems:   Unable to assess due to AMS  Past Medical History:  She,  has a past medical history of Coronary artery disease, Depression, Diabetes mellitus without complication ([place]), Elevated lipids, GERD (gastroesophageal reflux disease), Hypertension, Schizophrenia ([place]), and Syphilis (acquired).   Surgical History:   Past Surgical History:  Procedure Laterality Date  COLONOSCOPY     COLONOSCOPY WITH PROPOFOL N/A 09/01/2015   Procedure: COLONOSCOPY WITH PROPOFOL;  Surgeon: Paul Y Oh, MD;  Location: [facility] ENDOSCOPY;  Service: Gastroenterology;  Laterality: N/A;   TUBAL LIGATION     wisdom teeth removal       Social History:   reports that she has been smoking cigarettes. She started smoking about 51 years ago. She has a 48.00 pack-year smoking history. She has never used smokeless tobacco. She reports current alcohol use of about 3.0 standard drinks per week. She reports that she does not currently use drugs after having used the following drugs: Marijuana.   Family History:  Her family history includes Alcoholism in her mother; Bone cancer in her father; Cancer in her mother; Diabetes in her maternal grandmother; Heart disease in her father; Suicidality in her paternal uncle. There is no history of Breast cancer.   Allergies No Known Allergies   Home Medications  Prior to Admission medications   Medication Sig Start Date End Date Taking? Authorizing Provider  amitriptyline (ELAVIL) 25 MG tablet Take 1 tablet  (25 mg total) by mouth at bedtime. 10/24/17  Yes McNew, Holly R, MD  chlorproMAZINE (THORAZINE) 25 MG tablet Take 25 mg by mouth 2 (two) times daily. 09/05/20  Yes [provider]  cholecalciferol (VITAMIN D3) 25 MCG (1000 UNIT) tablet Take 1,000 Units by mouth daily.   Yes [provider]  glimepiride (AMARYL) 2 MG tablet Take 2 mg by mouth daily. 09/01/20  Yes [provider]  lovastatin (MEVACOR) 40 MG tablet Take 1 tablet (40 mg total) by mouth at bedtime. 12/20/17  Yes Jones, Deanna C, MD  metFORMIN (GLUCOPHAGE) 1000 MG tablet Take 1,000 mg by mouth 2 (two) times daily.   Yes [provider]  metoprolol tartrate (LOPRESSOR) 25 MG tablet Take 1 tablet (25 mg total) by mouth 2 (two) times daily. 11/11/20  Yes Patel, Sona, MD  paliperidone (INVEGA) 6 MG 24 hr tablet Take 6 mg by mouth daily. 09/05/20  Yes [provider]  polyethylene glycol (MIRALAX / GLYCOLAX) 17 g packet Take 17 g by mouth daily.   Yes [provider]  senna-docusate (SENOKOT-S) 8.6-50 MG tablet Take 1 tablet by mouth at bedtime. 11/11/20  Yes Patel, Sona, MD  senna-docusate (SENOKOT-S) 8.6-50 MG tablet Take 1 tablet by mouth 2 (two) times daily. (0800 and 1600)   Yes [provider]  thiothixene (NAVANE) 2 MG capsule Take 1 capsule (2 mg total) by mouth 2 (two) times daily. 10/24/17  Yes McNew, Holly R, MD  vitamin C (ASCORBIC ACID) 500 MG tablet Take 500 mg by mouth 2 (two) times daily.   Yes [provider]  zinc gluconate 50 MG tablet Take 50 mg by mouth daily.   Yes [provider]     Critical care time: 40 minutes      , AGACNP-BC [facility] Prefer epic messenger for cross cover needs If after hours, please call E-link    

## 2021-02-08 NOTE — Progress Notes (Signed)
PHARMACY CONSULT NOTE - FOLLOW UP  Pharmacy Consult for Electrolyte Monitoring and Replacement   Recent Labs: Potassium (mmol/L)  Date Value  02/08/2021 3.7   Magnesium (mg/dL)  Date Value  65/78/4696 2.3   Calcium (mg/dL)  Date Value  29/52/8413 9.2   Albumin (g/dL)  Date Value  24/40/1027 3.4 (L)   Phosphorus (mg/dL)  Date Value  25/36/6440 1.6 (L)   Sodium (mmol/L)  Date Value  02/08/2021 162 (HH)     Assessment: 70 yo female with PMH significant for HTN, DM, schizophrenia, and depression who was admitted with severe sepsis due to UTI and COVID-19 infection, acute metabolic encephalopathy in the setting of sepsis and severe hypernatremia, and acute kidney injury. Pharmacy has been consulted for electrolyte management.  Goal of Therapy:  Electrolytes WNL  Plan:  Na [id number], Mg 3.1>2.3 - nephro consulted. Will defer management to nephro K 3.0 > 3.7 - no further repletion req'd today All other electrolytes WNL Follow up electrolytes with AM labs   Martyn Malay ,PharmD, [facility] Clinical Pharmacist 02/08/2021 9:20 AM

## 2021-02-09 ENCOUNTER — Encounter: Payer: Self-pay | Admitting: General Practice

## 2021-02-09 ENCOUNTER — Other Ambulatory Visit: Payer: Self-pay

## 2021-02-09 DIAGNOSIS — A419 Sepsis, unspecified organism: Secondary | ICD-10-CM | POA: Diagnosis not present

## 2021-02-09 DIAGNOSIS — E872 Acidosis: Secondary | ICD-10-CM | POA: Diagnosis not present

## 2021-02-09 DIAGNOSIS — N179 Acute kidney failure, unspecified: Secondary | ICD-10-CM

## 2021-02-09 DIAGNOSIS — Z7189 Other specified counseling: Secondary | ICD-10-CM

## 2021-02-09 DIAGNOSIS — L8990 Pressure ulcer of unspecified site, unspecified stage: Secondary | ICD-10-CM | POA: Insufficient documentation

## 2021-02-09 DIAGNOSIS — R6520 Severe sepsis without septic shock: Secondary | ICD-10-CM

## 2021-02-09 DIAGNOSIS — Z515 Encounter for palliative care: Secondary | ICD-10-CM

## 2021-02-09 DIAGNOSIS — L8992 Pressure ulcer of unspecified site, stage 2: Secondary | ICD-10-CM | POA: Insufficient documentation

## 2021-02-09 DIAGNOSIS — R4182 Altered mental status, unspecified: Secondary | ICD-10-CM | POA: Diagnosis not present

## 2021-02-09 LAB — CBC
HCT: 38.3 % (ref 36.0–46.0)
Hemoglobin: 11.7 g/dL — ABNORMAL LOW (ref 12.0–15.0)
MCH: 30.2 pg (ref 26.0–34.0)
MCHC: 30.5 g/dL (ref 30.0–36.0)
MCV: 99 fL (ref 80.0–100.0)
Platelets: 111 10*3/uL — ABNORMAL LOW (ref 150–400)
RBC: 3.87 MIL/uL (ref 3.87–5.11)
RDW: 14.1 % (ref 11.5–15.5)
WBC: 9.1 10*3/uL (ref 4.0–10.5)
nRBC: 0.2 % (ref 0.0–0.2)

## 2021-02-09 LAB — MRSA NEXT GEN BY PCR, NASAL: MRSA by PCR Next Gen: NOT DETECTED

## 2021-02-09 LAB — GLUCOSE, CAPILLARY
Glucose-Capillary: 82 mg/dL (ref 70–99)
Glucose-Capillary: 108 mg/dL — ABNORMAL HIGH (ref 70–99)
Glucose-Capillary: 159 mg/dL — ABNORMAL HIGH (ref 70–99)
Glucose-Capillary: 176 mg/dL — ABNORMAL HIGH (ref 70–99)
Glucose-Capillary: 138 mg/dL — ABNORMAL HIGH (ref 70–99)
Glucose-Capillary: 122 mg/dL — ABNORMAL HIGH (ref 70–99)
Glucose-Capillary: 103 mg/dL — ABNORMAL HIGH (ref 70–99)

## 2021-02-09 LAB — COMPREHENSIVE METABOLIC PANEL
ALT: 13 U/L (ref 0–44)
AST: 33 U/L (ref 15–41)
Albumin: 2.4 g/dL — ABNORMAL LOW (ref 3.5–5.0)
Alkaline Phosphatase: 45 U/L (ref 38–126)
Anion gap: 6 (ref 5–15)
BUN: 26 mg/dL — ABNORMAL HIGH (ref 8–23)
CO2: 28 mmol/L (ref 22–32)
Calcium: 8.7 mg/dL — ABNORMAL LOW (ref 8.9–10.3)
Chloride: 125 mmol/L — ABNORMAL HIGH (ref 98–111)
Creatinine, Ser: 0.75 mg/dL (ref 0.44–1.00)
GFR, Estimated: >60 mL/min (ref >60)
Glucose, Bld: 89 mg/dL (ref 70–99)
Potassium: 2.8 mmol/L — ABNORMAL LOW (ref 3.5–5.1)
Sodium: 159 mmol/L — ABNORMAL HIGH (ref 135–145)
Total Bilirubin: 0.5 mg/dL (ref 0.3–1.2)
Total Protein: 6.3 g/dL — ABNORMAL LOW (ref 6.5–8.1)

## 2021-02-09 LAB — URINE CULTURE: Culture: NO GROWTH

## 2021-02-09 LAB — C-REACTIVE PROTEIN: CRP: 6.4 mg/dL — ABNORMAL HIGH (ref <1.0)

## 2021-02-09 LAB — PROCALCITONIN: Procalcitonin: <0.1 ng/mL

## 2021-02-09 LAB — LEGIONELLA PNEUMOPHILA SEROGP 1 UR AG: L. pneumophila Serogp 1 Ur Ag: NEGATIVE

## 2021-02-09 LAB — D-DIMER, QUANTITATIVE: D-Dimer, Quant: 13.14 ug/mL-FEU — ABNORMAL HIGH (ref 0.00–0.50)

## 2021-02-09 LAB — SODIUM
Sodium: 151 mmol/L — ABNORMAL HIGH (ref 135–145)
Sodium: 149 mmol/L — ABNORMAL HIGH (ref 135–145)

## 2021-02-09 LAB — PHOSPHORUS: Phosphorus: 1.7 mg/dL — ABNORMAL LOW (ref 2.5–4.6)

## 2021-02-09 LAB — MAGNESIUM: Magnesium: 2.2 mg/dL (ref 1.7–2.4)

## 2021-02-09 LAB — FIBRINOGEN: Fibrinogen: 527 mg/dL — ABNORMAL HIGH (ref 210–475)

## 2021-02-09 MED ORDER — POTASSIUM CHLORIDE 10 MEQ/100ML IV SOLN
10.0000 meq | INTRAVENOUS | Status: AC
Start: 2021-02-09 — End: 2021-02-09
  Administered 2021-02-09 (×4): 10 meq via INTRAVENOUS
  Filled 2021-02-09 (×5): qty 100

## 2021-02-09 MED ORDER — ORAL CARE MOUTH RINSE
15.0000 mL | Freq: Two times a day (BID) | OROMUCOSAL | Status: DC
  Administered 2021-02-09 – 2021-03-29 (×87): 15 mL via OROMUCOSAL

## 2021-02-09 MED ORDER — MAGNESIUM SULFATE 2 GM/50ML IV SOLN
2.0000 g | Freq: Once | INTRAVENOUS | Status: AC
  Administered 2021-02-09: 2 g via INTRAVENOUS
  Filled 2021-02-09: qty 50

## 2021-02-09 MED ORDER — POTASSIUM PHOSPHATES 15 MMOLE/5ML IV SOLN
15.0000 mmol | Freq: Once | INTRAVENOUS | Status: AC
  Administered 2021-02-09: 15 mmol via INTRAVENOUS
  Filled 2021-02-09: qty 5

## 2021-02-09 MED ORDER — CHLORHEXIDINE GLUCONATE 0.12 % MT SOLN
15.0000 mL | Freq: Two times a day (BID) | OROMUCOSAL | Status: DC
  Administered 2021-02-09 – 2021-03-29 (×90): 15 mL via OROMUCOSAL
  Filled 2021-02-09 (×78): qty 15

## 2021-02-09 MED ORDER — SODIUM CHLORIDE 0.9 % IV SOLN
2.0000 g | INTRAVENOUS | Status: DC
  Administered 2021-02-09 – 2021-02-10 (×2): 2 g via INTRAVENOUS
  Filled 2021-02-09 (×2): qty 20
  Filled 2021-02-09: qty 2

## 2021-02-09 NOTE — TOC Initial Note (Signed)
Transition of Care [facility]) - Initial/Assessment Note    Patient Details  Name: Erica Mueller MRN: [id number] Date of Birth: [birth year]  [facility]) CM/SW Contact:    Marina Goodell Phone Number: [phone] 02/09/2021, 7:44 AM  Clinical Narrative:                  Patient present to [facility] from [facility] long-term care, w/ altered mental status and later became unresponsive.  Patient is COVID positive.  Patient needs assistance with  most ADLs, but is able to ambulate. Patient high risk for intubation.  Palliative care consult has been placed.  Patient's brother Joslyn Devon [phone] is main contact.  Expected Discharge Plan: Long Term Nursing Home Barriers to Discharge: Continued Medical Work up   Patient Goals and CMS Choice        Expected Discharge Plan and Services Expected Discharge Plan: Long Term Nursing Home In-house Referral: Clinical Social Work   Post Acute Care Choice: Nursing Home Living arrangements for the past 2 months:  Air cabin crew Health Care / Long Term Care Facility)                                      Prior Living Arrangements/Services Living arrangements for the past 2 months:  Air cabin crew Health Care / Long Term Care Facility)   Patient language and need for interpreter reviewed:: Yes Do you feel safe going back to the place where you live?: Yes      Need for Family Participation in Patient Care: Yes (Comment) Care giver support system in place?: Yes (comment)   Criminal Activity/Legal Involvement Pertinent to Current Situation/Hospitalization: No - Comment as needed  Activities of Daily Living      Permission Sought/Granted Permission sought to share information with : Facility Medical sales representative    Share Information with NAME: Joslyn Devon (Brother)   [phone] (Mobile)           Emotional Assessment Appearance:: Appears older than stated age Attitude/Demeanor/Rapport: Unable to Assess Affect  (typically observed): Unable to Assess   Alcohol / Substance Use: Not Applicable Psych Involvement: No (comment)  Admission diagnosis:  Edema [R60.9] Hypernatremia [E87.0] Lactic acid acidosis [E87.2] Hyperglycemia [R73.9] Acute respiratory failure with hypoxia (HCC) [J96.01] AKI (acute kidney injury) (HCC) [N17.9] Sepsis (HCC) [A41.9] Altered mental status, unspecified altered mental status type [R41.82] Sepsis, due to unspecified organism, unspecified whether acute organ dysfunction present [facility]) [A41.9] Patient Active Problem List   Diagnosis Date Noted   Pressure injury of skin 02/09/2021   Sepsis (HCC) 02/07/2021   Acute metabolic encephalopathy 11/07/2020   Severe sepsis (HCC) 11/07/2020   UTI (urinary tract infection) 11/07/2020   GERD (gastroesophageal reflux disease) 12/20/2017   Hyperlipemia 12/20/2017   History of schizophrenia 07/30/2017   Schizophrenia (HCC) 05/27/2017   Diabetes (HCC) 04/02/2017   Hypertension 04/02/2017   Noncompliance 04/02/2017   Depression 11/17/2013   PCP:  [facility] Pharmacy:   MEDICAL [address] [place], [place] - [address] [place] [address] Phone: [phone] Fax: [phone]  [id number], [place] - [address] AT [facility] OF [address] [facility] [place] [address] Phone: [phone] Fax: [phone]  [facility] [place], [place] - [address] [place] [address] Phone: [phone] Fax: [phone]  Social Determinants of Health (SDOH) Interventions    Readmission Risk Interventions No flowsheet data found.   

## 2021-02-09 NOTE — Progress Notes (Signed)
PHARMACY CONSULT NOTE - FOLLOW UP  Pharmacy Consult for Electrolyte Monitoring and Replacement   Recent Labs: Potassium (mmol/L)  Date Value  02/09/2021 2.8 (L)   Magnesium (mg/dL)  Date Value  10/19/209 2.3   Calcium (mg/dL)  Date Value  17/35/6701 8.7 (L)   Albumin (g/dL)  Date Value  41/08/129 2.4 (L)   Phosphorus (mg/dL)  Date Value  43/88/8757 1.6 (L)   Sodium (mmol/L)  Date Value  02/09/2021 159 (H)     Assessment: 70 yo female with PMH significant for HTN, DM, schizophrenia, and depression who was admitted with severe sepsis due to UTI and COVID-19 infection, acute metabolic encephalopathy in the setting of sepsis and severe hypernatremia, and acute kidney injury. Pharmacy has been consulted for electrolyte management.  Goal of Therapy:  Electrolytes WNL  Plan:  Na 166>163>162>161>159, Mg 3.1>2.3>2.2 - nephro consulted. Will defer management to nephro K 3.0 > 3.7 >2.8 Replacing as MD ordered KCL IV 10 mEq q1h x4. Mg 2.2  MD order 2g MgSO4 per low K+ Phos 1.7 Ordered IV Kphos (supplies K+) Follow up electrolytes with AM labs   Martyn Malay ,PharmD, [facility] Clinical Pharmacist 02/09/2021 8:29 AM

## 2021-02-09 NOTE — Progress Notes (Signed)
Pt transferred to room 155. Pt alert. Nurse notified of pt's arrival. Vitals stable. PIV intact and fluids infusing. Report given to Hilda Lias, RN.

## 2021-02-09 NOTE — Consult Note (Signed)
Consultation Note Date: 02/09/2021   Patient Name: Erica Mueller  DOB: 03/12/1951  MRN: [id number]  Age / Sex: 71 y.o., female  PCP: [facility] Referring Physician: Alberteen Sam, *  Reason for Consultation:   HPI/Patient Profile: 70 y.o. female  with past medical history of HTN, DM, schizophrenia, depression, anorexia r/t chronic health issues, CAD, GERD (hospitalization 5/30-6/3 for severe sepsis r/t cystitis, discharged to [facility]) admitted on 02/07/2021 with severe sepsis r/t UTI, Covid infection, severe hypernatremia and acute kidney injury due to dehydration. Palliative medicine consulted for goals of care.   Primary Decision Maker NEXT OF KIN- children- Erica Mueller and Erica Mueller  Discussion: Palliative medicine consulted due to patient with ongoing decline since hospitalization in June and even more progressive since Covid infection 2 weeks ago.  Spoke with patient's brotherGery Mueller. He notes that prior to her Covid diagnosis she was walking in the hall with him and able to carry on a conversation. She has been living at [facility] and prior to that was living with her Granddaughter- he notes that Granddaughter Erica Mueller would be more able to discuss her overall status and decline. Eddis has 2 children who live in [place], [place].  I was able to contact her son- Erica Mueller.  We discussed her current acute illness and the overall decline recently. The need for advanced care planning and goals of care were discussed.  Erica Mueller is planning to come to [facility] on Saturday and wishes for further in person discussion- he is aware that due to no legal HCPOA document that he and his sister are surrogate decision makers for patient.  He requests full code status for now.   SUMMARY OF RECOMMENDATIONS -Full scope- full code -Patient's children are legal decision makers- I have added their information to  patient's contact list -PMT will follow up with son on Monday    Code Status/Advance Care Planning: Full code   Prognosis:   Unable to determine  Discharge Planning: To Be Determined  Primary Diagnoses: Present on Admission:  Sepsis (HCC)   Review of Systems  Unable to perform ROS: Mental status change   Physical Exam Vitals and nursing note reviewed.  Constitutional:      Comments: Frail, cachetic  Cardiovascular:     Rate and Rhythm: Normal rate.  Pulmonary:     Effort: Pulmonary effort is normal.  Neurological:     Mental Status: She is disoriented.    Vital Signs: BP 134/71   Pulse 99   Temp 98.2 F (36.8 C) (Axillary)   Resp 20   Ht 5\' 2"  (1.575 m)   Wt 50 kg   SpO2 96%   BMI 20.16 kg/m  Pain Scale: CPOT       SpO2: SpO2: 96 % O2 Device:SpO2: 96 % O2 Flow Rate: .O2 Flow Rate (L/min): 2 L/min  IO: Intake/output summary:  Intake/Output Summary (Last 24 hours) at 02/09/2021 1358 Last data filed at 02/09/2021 1300 Gross per 24 hour  Intake 2617.04 ml  Output 1135  ml  Net 1482.04 ml    LBM: Last BM Date:  (PTA) Baseline Weight: Weight: 50 kg Most recent weight: Weight: 50 kg     Palliative Assessment/Data: PPS: 10%     Thank you for this consult. Palliative medicine will continue to follow and assist as needed.   Time In: 1355 Time Out: 1508 Time Total: 73 minutes Greater than 50%  of this time was spent counseling and coordinating care related to the above assessment and plan.  Signed by: Ocie Bob, AGNP-C Palliative Medicine    Please contact Palliative Medicine Team phone at [phone] for questions and concerns.  For individual provider: See Loretha Stapler

## 2021-02-09 NOTE — Progress Notes (Signed)
[facility] PROGRESS NOTE    Erica Mueller  ZOX:[id number] DOB: 1950-09-22 DOA: 02/07/2021 PCP: [name]      Brief Narrative:  Erica Mueller is a 70 y.o. F with CAD, DM, HTN, dementia, and schizophrenia, lives in [place] since May 2022 UTI sepsis who presented with acute somnolence for 1 day.  At baseline, patient able to ambluate and answer questions with one word answers but required assistance to bathe, dress, toilet and not able to engage in conversation.  Per family, she tested positive for COVID "2 weeks ago" and has steadily declined since then.  On the day of admission, she was found at her facility to be somnolent, sent to ER.  In the ER, tachycardic to 120s, tachypneic to 40s, hypotensive initially, GCS 5.  Na 166. Glu 260, Cr 1.4 up from baseline 0.5, Lactate 2.3.  UA suggestive of UTI, CXR clear. Started on fluids, antibiotics and admitted to ICU.      8/30: Admitted to ICU, nephrology consulted 8/31: Transferred to stepdown          Assessment & Plan:  Severe sepsis due to UTI Presented with tachycardia, tachypnea, encephalopathy and AKI in the setting of UTI. - Continue antibiotics, narrow to Rocephin without MDR risk factors  Hypernatremia Na down 7 mmol/L in first 24 hours, down to 151 mmol/L this afternoon. - Consult Nephrology -Continue hypotonic fluids - Trend BMP  Resolved COVID >14 days out from infection.  Low utility for remdesivir at this point.  No hypoxia to indicate steroids.    Acute metabolic encephalopathy Multifactorial due to sepsis, hyponatremia, COVID, dementia. -Hold amitriptyline, thorazine, thiothixine and Invega -Consult SLP  Acute respiratory failure ruled out  Diabetes Glucoses low normal -Continue SS corrections -Hold metformin and sulfonylurea  Coronary disease Hypertension -Hold lovastatin, metoprolol until clinical status improves  Schizophrenia -Hold amitriptyline, thorazine, thiothixine and  Invega until mentation better  Possible dementia  Elevated troponin This is supply demand mismatch ischemia due to sepsis.  Not ACS.  Hypokalemia Mag normal - Supplement K  Acute kidney injury  Stage II pressure injury sacrum, POA  Thrombocytopenia due to sepsis               Disposition: Status is: Inpatient  Remains inpatient appropriate because:Altered mental status and IV treatments appropriate due to intensity of illness or inability to take PO  Dispo: The patient is from: SNF              Anticipated d/c is to: SNF              Patient currently is not medically stable to d/c.   Difficult to place patient No       Level of care: Med-Surg       MDM: The below labs and imaging reports were reviewed and summarized above.  Medication management as above.    DVT prophylaxis: heparin injection 5,000 Units Start: 02/07/21 2200 SCDs Start: 02/07/21 1805  Code Status: FULL Family Communication: son LC by phone    Consultants:  [facility] Nephrology  Procedures:  8/30 CTA chest -- no PE, bilateral infiltrates 8/30 Doppler US -- negative for DVT  Antimicrobials:     Culture data:             Subjective: No fever overnight.  Nursing report no respiratory distress, vomiting, change in mentation, diarrhea.  Patient is nonverbal.  Objective: Vitals:   02/09/21 1200 02/09/21 1300 02/09/21 1400 02/09/21 1500  BP: Marland Kitchen)  109/47 134/71 120/70 (!) 145/68  Pulse: 86 99 91 93  Resp: (!) 27 20 (!) 26 (!) 26  Temp:  98.2 F (36.8 C)    TempSrc:  Axillary    SpO2: 93% 96% 97% 99%  Weight:      Height:        Intake/Output Summary (Last 24 hours) at 02/09/2021 1631 Last data filed at 02/09/2021 1500 Gross per 24 hour  Intake 2352.75 ml  Output 1180 ml  Net 1172.75 ml   Filed Weights   02/07/21 1842  Weight: 50 kg    Examination: General appearance:  adult female, sleepy, lying in bed, does not respond to commands HEENT: Anicteric, pupils  equal, conjunctiva pink, lids and lashes normal. No nasal deformity, discharge, epistaxis.   Skin: Warm and dry.    No suspicious rashes or lesions. Cardiac: RRR, nl S1-S2, no murmurs appreciated.  Capillary refill is brisk.  JVP normal.  No LE edema.  Radial  pulses 2+ and symmetric. Respiratory: Respiratory effort shallow, does not follow commands, I do not appreciate rales or wheezes. Abdomen: Abdomen soft.  No grimace to palpation or guarding, no ascites, distension, hepatosplenomegaly.   MSK: No deformities or effusions.  Thenar wasting, diffuse loss of subcutaneous muscle mass and fat Neuro: Awake but somnolent, pupils equal and reactive, appears to have equal tone in the upper extremities, but does not follow commands, no spontaneous verbalizations or purposeful movements.   Psych: Seems to track my movements, but makes no responses.    Data Reviewed: I have personally reviewed following labs and imaging studies:  CBC: Recent Labs  Lab 02/07/21 1516 02/08/21 0405 02/09/21 0114  WBC 17.2* 13.8* 9.1  NEUTROABS 15.3*  --   --   HGB 15.1* 12.9 11.7*  HCT 50.5* 43.9 38.3  MCV 100.4* 98.9 99.0  PLT 159 118* 111*   Basic Metabolic Panel: Recent Labs  Lab 02/07/21 1516 02/07/21 2039 02/08/21 0405 02/08/21 0918 02/08/21 1254 02/08/21 1654 02/08/21 2048 02/09/21 0114 02/09/21 1242  NA 166* 163* 162*   < > 161* 161* 159* 159* 151*  K 3.5 3.0* 3.7  --   --   --   --  2.8*  --   CL 126* 128* 128*  --   --   --   --  125*  --   CO2 25 28 29   --   --   --   --  28  --   GLUCOSE 360* 240* 70  --   --   --   --  89  --   BUN 88* 62* 48*  --   --   --   --  26*  --   CREATININE 1.42* 1.14* 0.85  --   --   --   --  0.75  --   CALCIUM 9.8 8.5* 9.2  --   --   --   --  8.7*  --   MG 3.1*  --  2.3  --   --   --   --  2.2  --   PHOS  --   --  1.6*  --   --   --   --  1.7*  --    < > = values in this interval not displayed.   GFR: Estimated Creatinine Clearance: 51.6 mL/min (by C-G  formula based on SCr of 0.75 mg/dL). Liver Function Tests: Recent Labs  Lab 02/07/21 1516 02/08/21 0850 02/09/21 0114  AST 14*  20 33  ALT 12 11 13   ALKPHOS 57 46 45  BILITOT 1.0 0.6 0.5  PROT 8.2* 6.5 6.3*  ALBUMIN 3.4* 2.7* 2.4*   No results for input(s): LIPASE, AMYLASE in the last 168 hours. No results for input(s): AMMONIA in the last 168 hours. Coagulation Profile: Recent Labs  Lab 02/07/21 1516  INR 1.5*   Cardiac Enzymes: No results for input(s): CKTOTAL, CKMB, CKMBINDEX, TROPONINI in the last 168 hours. BNP (last 3 results) No results for input(s): PROBNP in the last 8760 hours. HbA1C: Recent Labs    02/07/21 1537  HGBA1C 7.9*   CBG: Recent Labs  Lab 02/09/21 0418 02/09/21 0800 02/09/21 1126 02/09/21 1254 02/09/21 1554  GLUCAP 82 108* 159* 176* 138*   Lipid Profile: No results for input(s): CHOL, HDL, LDLCALC, TRIG, CHOLHDL, LDLDIRECT in the last 72 hours. Thyroid Function Tests: Recent Labs    02/07/21 1516  TSH 1.837   Anemia Panel: No results for input(s): VITAMINB12, FOLATE, FERRITIN, TIBC, IRON, RETICCTPCT in the last 72 hours. Urine analysis:    Component Value Date/Time   COLORURINE YELLOW (A) 02/07/2021 1650   APPEARANCEUR CLOUDY (A) 02/07/2021 1650   LABSPEC 1.026 02/07/2021 1650   PHURINE 5.0 02/07/2021 1650   GLUCOSEU 50 (A) 02/07/2021 1650   HGBUR MODERATE (A) 02/07/2021 1650   BILIRUBINUR NEGATIVE 02/07/2021 1650   KETONESUR 5 (A) 02/07/2021 1650   PROTEINUR 30 (A) 02/07/2021 1650   NITRITE NEGATIVE 02/07/2021 1650   LEUKOCYTESUR MODERATE (A) 02/07/2021 1650   Sepsis Labs: @LABRCNTIP (procalcitonin:4,lacticacidven:4)  ) Recent Results (from the past 240 hour(s))  Resp Panel by RT-PCR (Flu A&B, Covid) Nasopharyngeal Swab     Status: Abnormal   Collection Time: 02/07/21  3:16 PM   Specimen: Nasopharyngeal Swab; Nasopharyngeal(NP) swabs in vial transport medium  Result Value Ref Range Status   SARS Coronavirus 2 by RT PCR  POSITIVE (A) NEGATIVE Final    Comment: RESULT CALLED TO, READ BACK BY AND VERIFIED WITH: MATT BASSETT 02/07/21 1644 AMK (NOTE) SARS-CoV-2 target nucleic acids are DETECTED.  The SARS-CoV-2 RNA is generally detectable in upper respiratory specimens during the acute phase of infection. Positive results are indicative of the presence of the identified virus, but do not rule out bacterial infection or co-infection with other pathogens not detected by the test. Clinical correlation with patient history and other diagnostic information is necessary to determine patient infection status. The expected result is Negative.  Fact Sheet for Patients: 02/09/21  Fact Sheet for Healthcare Providers: 02/09/21  This test is not yet approved or cleared by the [employer] FDA and  has been authorized for detection and/or diagnosis of SARS-CoV-2 by FDA under an Emergency Use Authorization (EUA).  This EUA will remain in effect (meaning this test can be use d) for the duration of  the COVID-19 declaration under Section 564(b)(1) of the Act, 21 U.S.C. section 360bbb-3(b)(1), unless the authorization is terminated or revoked sooner.     Influenza A by PCR NEGATIVE NEGATIVE Final   Influenza B by PCR NEGATIVE NEGATIVE Final    Comment: (NOTE) The Xpert Xpress SARS-CoV-2/FLU/RSV plus assay is intended as an aid in the diagnosis of influenza from Nasopharyngeal swab specimens and should not be used as a sole basis for treatment. Nasal washings and aspirates are unacceptable for Xpert Xpress SARS-CoV-2/FLU/RSV testing.  Fact Sheet for Patients: SeriousBroker.it  Fact Sheet for Healthcare Providers: [place]  This test is not yet approved or cleared by the [employer] FDA and has  been authorized for detection and/or diagnosis of SARS-CoV-2 by FDA under an Emergency Use  Authorization (EUA). This EUA will remain in effect (meaning this test can be used) for the duration of the COVID-19 declaration under Section 564(b)(1) of the Act, 21 U.S.C. section 360bbb-3(b)(1), unless the authorization is terminated or revoked.  Performed at [facility], [address] [place], [place] [address]   Blood culture (routine single)     Status: None (Preliminary result)   Collection Time: 02/07/21  3:37 PM   Specimen: BLOOD  Result Value Ref Range Status   Specimen Description BLOOD RIGHT ANTECUBITAL  Final   Special Requests   Final    BOTTLES DRAWN AEROBIC AND ANAEROBIC Blood Culture adequate volume   Culture   Final    NO GROWTH 2 DAYS Performed at [facility], [address] [place], [place] [address]    Report Status PENDING  Incomplete  Culture, blood (single)     Status: None (Preliminary result)   Collection Time: 02/07/21  3:50 PM   Specimen: BLOOD  Result Value Ref Range Status   Specimen Description BLOOD LEFT ASSIST CONTROL  Final   Special Requests   Final    BOTTLES DRAWN AEROBIC AND ANAEROBIC Blood Culture adequate volume   Culture   Final    NO GROWTH 2 DAYS Performed at [facility], [address], [place], [place] [address]    Report Status PENDING  Incomplete  Urine Culture     Status: None   Collection Time: 02/07/21  4:50 PM   Specimen: In/Out Cath Urine  Result Value Ref Range Status   Specimen Description   Final    IN/OUT CATH URINE Performed at [facility], [address], [place], [place] [address]    Special Requests   Final    NONE Performed at [facility], [address] [place], [place] [address]    Culture   Final    NO GROWTH Performed at [facility], [address] [place], [place] [address]    Report Status 02/09/2021 FINAL  Final  MRSA Next Gen by PCR, Nasal     Status: None   Collection Time: 02/09/21  9:47 AM   Specimen: Nasal Mucosa; Nasal Swab   Result Value Ref Range Status   MRSA by PCR Next Gen NOT DETECTED NOT DETECTED Final    Comment: (NOTE) The GeneXpert MRSA Assay (FDA approved for NASAL specimens only), is one component of a comprehensive MRSA colonization surveillance program. It is not intended to diagnose MRSA infection nor to guide or monitor treatment for MRSA infections. Test performance is not FDA approved in patients less than 54 years old. Performed at [facility], [address], [place], [place] [address]          Radiology Studies: CT Angio Chest Pulmonary Embolism (PE) W or WO Contrast  Result Date: 02/07/2021 CLINICAL DATA:  Altered mental status EXAM: CT ANGIOGRAPHY CHEST WITH CONTRAST TECHNIQUE: Multidetector CT imaging of the chest was performed using the standard protocol during bolus administration of intravenous contrast. Multiplanar CT image reconstructions and MIPs were obtained to evaluate the vascular anatomy. CONTRAST:  108mL OMNIPAQUE IOHEXOL 350 MG/ML SOLN COMPARISON:  Chest x-ray 02/07/2021 FINDINGS: Cardiovascular: Slightly limited evaluation secondary to motion degradation. No definite acute pulmonary embolus is visualized. Aorta is nonaneurysmal. Moderate atherosclerosis. Coronary vascular calcification. Normal cardiac size. No pericardial effusion Mediastinum/Nodes: Midline trachea. No thyroid mass. No suspicious adenopathy. Esophagus within  normal limits Lungs/Pleura: Emphysema. Partial consolidation within the posterior right upper lobe and the right lower lobe. No pleural effusion or pneumothorax. Upper Abdomen: No acute abnormality. Musculoskeletal: No chest wall abnormality. No acute or significant osseous findings. Review of the MIP images confirms the above findings. IMPRESSION: 1. No definite acute pulmonary embolus identified allowing for mild motion degradation. 2. Emphysema with partial consolidations in the right upper lobe and right lower lobe concerning for pneumonia and  or aspiration. Aortic Atherosclerosis (ICD10-I70.0) and Emphysema (ICD10-J43.9). Electronically Signed   By: Jasmine Pang M.D.   On: 02/07/2021 23:22   US Venous Img Lower Bilateral (DVT)  Result Date: 02/08/2021 CLINICAL DATA:  Bilateral lower extremity edema. EXAM: BILATERAL LOWER EXTREMITY VENOUS DOPPLER ULTRASOUND TECHNIQUE: Gray-scale sonography with compression, as well as color and duplex ultrasound, were performed to evaluate the deep venous system(s) from the level of the common femoral vein through the popliteal and proximal calf veins. COMPARISON:  None. FINDINGS: VENOUS Normal compressibility of the common femoral, superficial femoral, and popliteal veins, as well as the visualized calf veins. Visualized portions of profunda femoral vein and great saphenous vein unremarkable. No filling defects to suggest DVT on grayscale or color Doppler imaging. Doppler waveforms show normal direction of venous flow, normal respiratory plasticity and response to augmentation. Limited views of the contralateral common femoral vein are unremarkable. OTHER None. Limitations: none IMPRESSION: Negative. Electronically Signed   By: Aram Candela M.D.   On: 02/08/2021 00:43   ECHOCARDIOGRAM COMPLETE  Result Date: 02/08/2021    ECHOCARDIOGRAM REPORT   Patient Name:   SHANDON MATSON Date of Exam: 02/08/2021 Medical Rec #:  [id number]      Height:       62.0 in Accession #:    [id number]     Weight:       110.2 lb Date of Birth:  Jan 01, 1951       BSA:          1.484 m Patient Age:    70 years       BP:           114/55 mmHg Patient Gender: F              HR:           102 bpm. Exam Location:  [facility] Procedure: 2D Echo, Color Doppler and Cardiac Doppler Indications:     Elevated d-dimer  History:         Patient has no prior history of Echocardiogram examinations.                  CAD; Risk Factors:Hypertension and Diabetes. Pt tested positive                  for COVID-19 on 02/07/21.  Sonographer:     Humphrey Rolls  Referring Phys:  [id number] Judithe Modest Diagnosing Phys: Debbe Odea MD  Sonographer Comments: Technically challenging study due to limited acoustic windows. Image acquisition challenging due to uncooperative patient. IMPRESSIONS  1. Left ventricular ejection fraction, by estimation, is 30 to 35%. The left ventricle has moderate to severely decreased function. The left ventricle demonstrates global hypokinesis. Left ventricular diastolic parameters are consistent with Grade I diastolic dysfunction (impaired relaxation).  2. Right ventricular systolic function was not well visualized. The right ventricular size is normal.  3. The mitral valve is degenerative. Mild mitral valve regurgitation.  4. The aortic valve was not well visualized. Aortic valve regurgitation is mild to  moderate. FINDINGS  Left Ventricle: Left ventricular ejection fraction, by estimation, is 30 to 35%. The left ventricle has moderate to severely decreased function. The left ventricle demonstrates global hypokinesis. The left ventricular internal cavity size was normal in size. There is no left ventricular hypertrophy. Left ventricular diastolic parameters are consistent with Grade I diastolic dysfunction (impaired relaxation). Right Ventricle: The right ventricular size is normal. Right vetricular wall thickness was not well visualized. Right ventricular systolic function was not well visualized. Left Atrium: Left atrial size was normal in size. Right Atrium: Right atrial size was normal in size. Pericardium: There is no evidence of pericardial effusion. Mitral Valve: The mitral valve is degenerative in appearance. There is mild thickening of the mitral valve leaflet(s). There is mild calcification of the mitral valve leaflet(s). Mild mitral valve regurgitation. MV peak gradient, 6.2 mmHg. The mean mitral valve gradient is 2.0 mmHg. Tricuspid Valve: The tricuspid valve is not well visualized. Tricuspid valve regurgitation is not  demonstrated. Aortic Valve: The aortic valve was not well visualized. Aortic valve regurgitation is mild to moderate. Aortic regurgitation PHT measures 362 msec. Aortic valve mean gradient measures 3.0 mmHg. Aortic valve peak gradient measures 7.1 mmHg. Aortic valve area, by VTI measures 2.27 cm. Pulmonic Valve: The pulmonic valve was not well visualized. Pulmonic valve regurgitation is not visualized. Aorta: The aortic root is normal in size and structure. Venous: The inferior vena cava was not well visualized. IAS/Shunts: No atrial level shunt detected by color flow Doppler.  LEFT VENTRICLE PLAX 2D LVIDd:         4.65 cm  Diastology LVIDs:         3.96 cm  LV e' medial:    2.50 cm/s LV PW:         0.97 cm  LV E/e' medial:  26.7 LV IVS:        0.74 cm  LV e' lateral:   4.13 cm/s LVOT diam:     1.90 cm  LV E/e' lateral: 16.2 LV SV:         44 LV SV Index:   30 LVOT Area:     2.84 cm  RIGHT VENTRICLE RV Basal diam:  2.03 cm LEFT ATRIUM           Index       RIGHT ATRIUM          Index LA diam:      2.90 cm 1.95 cm/m  RA Area:     6.56 cm LA Vol (A4C): 36.0 ml 24.26 ml/m RA Volume:   11.70 ml 7.88 ml/m  AORTIC VALVE AV Area (Vmax):    2.24 cm AV Area (Vmean):   2.47 cm AV Area (VTI):     2.27 cm AV Vmax:           133.00 cm/s AV Vmean:          83.200 cm/s AV VTI:            0.195 m AV Peak Grad:      7.1 mmHg AV Mean Grad:      3.0 mmHg LVOT Vmax:         105.00 cm/s LVOT Vmean:        72.400 cm/s LVOT VTI:          0.156 m LVOT/AV VTI ratio: 0.80 AI PHT:            362 msec  AORTA Ao Root diam: 2.70 cm MITRAL VALVE MV Area (  PHT): 6.02 cm     SHUNTS MV Area VTI:   3.79 cm     Systemic VTI:  0.16 m MV Peak grad:  6.2 mmHg     Systemic Diam: 1.90 cm MV Mean grad:  2.0 mmHg MV Vmax:       1.25 m/s MV Vmean:      67.2 cm/s MV Decel Time: 126 msec MV E velocity: 66.80 cm/s MV A velocity: 123.00 cm/s MV E/A ratio:  0.54 Debbe Odea MD Electronically signed by Debbe Odea MD Signature Date/Time:  02/08/2021/5:46:09 PM    Final         Scheduled Meds:  vitamin C  500 mg Oral Daily   chlorhexidine  15 mL Mouth Rinse BID   Chlorhexidine Gluconate Cloth  6 each Topical Daily   heparin  5,000 Units Subcutaneous Q8H   insulin aspart  0-15 Units Subcutaneous Q4H   mouth rinse  15 mL Mouth Rinse q12n4p   Continuous Infusions:  cefTRIAXone (ROCEPHIN)  IV 2 g (02/09/21 1630)   dextrose 75 mL/hr at 02/09/21 1500   potassium PHOSPHATE IVPB (in mmol) 43 mL/hr at 02/09/21 1500   remdesivir 100 mg in NS 100 mL Stopped (02/09/21 1006)     LOS: 2 days    Time spent: 35 minutes    Alberteen Sam, MD [facility] 02/09/2021, 4:31 PM     Please page though AMION or Epic secure chat:  For [employer], Higher education careers adviser

## 2021-02-10 ENCOUNTER — Inpatient Hospital Stay: Payer: Medicare Other

## 2021-02-10 DIAGNOSIS — N179 Acute kidney failure, unspecified: Secondary | ICD-10-CM | POA: Diagnosis not present

## 2021-02-10 DIAGNOSIS — R6520 Severe sepsis without septic shock: Secondary | ICD-10-CM | POA: Diagnosis not present

## 2021-02-10 DIAGNOSIS — A419 Sepsis, unspecified organism: Secondary | ICD-10-CM | POA: Diagnosis not present

## 2021-02-10 DIAGNOSIS — E43 Unspecified severe protein-calorie malnutrition: Secondary | ICD-10-CM | POA: Insufficient documentation

## 2021-02-10 LAB — CBC
HCT: 39.5 % (ref 36.0–46.0)
Hemoglobin: 12.4 g/dL (ref 12.0–15.0)
MCH: 30.2 pg (ref 26.0–34.0)
MCHC: 31.4 g/dL (ref 30.0–36.0)
MCV: 96.1 fL (ref 80.0–100.0)
Platelets: 112 10*3/uL — ABNORMAL LOW (ref 150–400)
RBC: 4.11 MIL/uL (ref 3.87–5.11)
RDW: 13.7 % (ref 11.5–15.5)
WBC: 6.8 10*3/uL (ref 4.0–10.5)
nRBC: 0 % (ref 0.0–0.2)

## 2021-02-10 LAB — COMPREHENSIVE METABOLIC PANEL
ALT: 19 U/L (ref 0–44)
AST: 38 U/L (ref 15–41)
Albumin: 2.3 g/dL — ABNORMAL LOW (ref 3.5–5.0)
Alkaline Phosphatase: 50 U/L (ref 38–126)
Anion gap: 5 (ref 5–15)
BUN: 18 mg/dL (ref 8–23)
CO2: 28 mmol/L (ref 22–32)
Calcium: 7.8 mg/dL — ABNORMAL LOW (ref 8.9–10.3)
Chloride: 111 mmol/L (ref 98–111)
Creatinine, Ser: 0.59 mg/dL (ref 0.44–1.00)
GFR, Estimated: >60 mL/min (ref >60)
Glucose, Bld: 188 mg/dL — ABNORMAL HIGH (ref 70–99)
Potassium: 3.5 mmol/L (ref 3.5–5.1)
Sodium: 144 mmol/L (ref 135–145)
Total Bilirubin: 0.6 mg/dL (ref 0.3–1.2)
Total Protein: 6.1 g/dL — ABNORMAL LOW (ref 6.5–8.1)

## 2021-02-10 LAB — GLUCOSE, CAPILLARY
Glucose-Capillary: 143 mg/dL — ABNORMAL HIGH (ref 70–99)
Glucose-Capillary: 143 mg/dL — ABNORMAL HIGH (ref 70–99)
Glucose-Capillary: 176 mg/dL — ABNORMAL HIGH (ref 70–99)
Glucose-Capillary: 143 mg/dL — ABNORMAL HIGH (ref 70–99)
Glucose-Capillary: 248 mg/dL — ABNORMAL HIGH (ref 70–99)
Glucose-Capillary: 245 mg/dL — ABNORMAL HIGH (ref 70–99)

## 2021-02-10 LAB — MAGNESIUM: Magnesium: 2.1 mg/dL (ref 1.7–2.4)

## 2021-02-10 LAB — PHOSPHORUS: Phosphorus: 2.5 mg/dL (ref 2.5–4.6)

## 2021-02-10 MED ORDER — FREE WATER
100.0000 mL | Status: AC
  Administered 2021-02-10 – 2021-02-22 (×73): 100 mL

## 2021-02-10 MED ORDER — BISACODYL 10 MG RE SUPP
10.0000 mg | Freq: Once | RECTAL | Status: AC
  Administered 2021-02-10: 10 mg via RECTAL
  Filled 2021-02-10: qty 1

## 2021-02-10 MED ORDER — OSMOLITE 1.2 CAL PO LIQD
1000.0000 mL | ORAL | Status: AC
  Administered 2021-02-10 – 2021-02-22 (×6): 1000 mL

## 2021-02-10 NOTE — Plan of Care (Signed)
Patient alert and non verbal, transferred from ICU. Mews protocol continued upon arrival and resolved with 2 hour follow up assessment. No signs of pain noted via PAINAID. Vitals stable, will continue to monitor blood pressures, no respiratory distress on room air. Currently on q4hour glucose monitoring.  Problem: Education: Goal: Knowledge of General Education information will improve Description: Including pain rating scale, medication(s)/side effects and non-pharmacologic comfort measures Outcome: Progressing   Problem: Health Behavior/Discharge Planning: Goal: Ability to manage health-related needs will improve Outcome: Progressing   Problem: Clinical Measurements: Goal: Ability to maintain clinical measurements within normal limits will improve Outcome: Progressing Goal: Will remain free from infection Outcome: Progressing Goal: Diagnostic test results will improve Outcome: Progressing Goal: Respiratory complications will improve Outcome: Progressing Goal: Cardiovascular complication will be avoided Outcome: Progressing   Problem: Activity: Goal: Risk for activity intolerance will decrease Outcome: Progressing   Problem: Nutrition: Goal: Adequate nutrition will be maintained Outcome: Progressing   Problem: Pain Managment: Goal: General experience of comfort will improve Outcome: Progressing   Problem: Respiratory: Goal: Will maintain a patent airway Outcome: Progressing Goal: Complications related to the disease process, condition or treatment will be avoided or minimized Outcome: Progressing

## 2021-02-10 NOTE — Progress Notes (Signed)
SLP Cancellation Note  Patient Details Name: Erica Mueller MRN: [id number] DOB: 1951/05/25   Cancelled treatment:       Reason Eval/Treat Not Completed: Fatigue/lethargy limiting ability to participate;Medical issues which prohibited therapy;Patient's level of consciousness;Patient not medically ready  Unfortunately, pt was not able to arouse enough for safe PO intake. Continue recommending NPO at this time.   [name] B. Dreama Saa M.S., CCC-SLP, [facility] Speech-Language Pathologist Rehabilitation Services Office [phone]   Reuel Derby 02/10/2021, 9:29 AM

## 2021-02-10 NOTE — TOC Initial Note (Signed)
Transition of Care [facility]) - Initial/Assessment Note    Patient Details  Name: Erica Mueller MRN: [id number] Date of Birth: [birth year]  Transition of Care [facility]) CM/SW Contact:    Barrie Dunker, RN Phone Number: 02/10/2021, 3:07 PM  Clinical Narrative:              Patient is long term resident at [facility], she is not doing well and Palliative is following, the doctor and the palliative nurse is having discussions with the family  CM to continue to follow for needs   Expected Discharge Plan: Long Term Nursing Home Barriers to Discharge: Continued Medical Work up   Patient Goals and CMS Choice        Expected Discharge Plan and Services Expected Discharge Plan: Long Term Nursing Home In-house Referral: Clinical Social Work   Post Acute Care Choice: Nursing Home Living arrangements for the past 2 months:  ([facility] / Long Term Care Facility)                                      Prior Living Arrangements/Services Living arrangements for the past 2 months:  ([facility] / Long Term Care Facility)   Patient language and need for interpreter reviewed:: Yes Do you feel safe going back to the place where you live?: Yes      Need for Family Participation in Patient Care: Yes (Comment) Care giver support system in place?: Yes (comment)   Criminal Activity/Legal Involvement Pertinent to Current Situation/Hospitalization: No - Comment as needed  Activities of Daily Living Home Assistive Devices/Equipment: Other (Comment) (unknown from facility) ADL Screening (condition at time of admission) Patient's cognitive ability adequate to safely complete daily activities?: No Is the patient deaf or have difficulty hearing?: No Does the patient have difficulty seeing, even when wearing glasses/contacts?: No Does the patient have difficulty concentrating, remembering, or making decisions?: Yes Patient able to express need for assistance with ADLs?:  No Does the patient have difficulty dressing or bathing?: Yes Independently performs ADLs?: No Communication: Dependent Is this a change from baseline?: Change from baseline, expected to last >3 days Dressing (OT): Dependent Is this a change from baseline?: Change from baseline, expected to last >3 days Grooming: Dependent Is this a change from baseline?: Change from baseline, expected to last >3 days Feeding: Dependent Is this a change from baseline?: Change from baseline, expected to last >3 days Bathing: Dependent Is this a change from baseline?: Change from baseline, expected to last >3 days Toileting: Dependent Is this a change from baseline?: Change from baseline, expected to last >3days In/Out Bed: Dependent Is this a change from baseline?: Change from baseline, expected to last >3 days Walks in Home: Dependent Does the patient have difficulty walking or climbing stairs?: Yes Weakness of Legs: Both Weakness of Arms/Hands: Both  Permission Sought/Granted Permission sought to share information with : Facility Medical sales representative    Share Information with NAME: Joslyn Devon (Brother)   [phone] ([place])           Emotional Assessment Appearance:: Appears older than stated age Attitude/Demeanor/Rapport: Unable to Assess Affect (typically observed): Unable to Assess   Alcohol / Substance Use: Not Applicable Psych Involvement: No (comment)  Admission diagnosis:  Edema [R60.9] Hypernatremia [E87.0] Lactic acid acidosis [E87.2] Hyperglycemia [R73.9] Acute respiratory failure with hypoxia (HCC) [J96.01] AKI (acute kidney injury) (HCC) [N17.9] Sepsis (HCC) [A41.9] Altered mental status,  unspecified altered mental status type [R41.82] Sepsis, due to unspecified organism, unspecified whether acute organ dysfunction present [facility]) [A41.9] Patient Active Problem List   Diagnosis Date Noted   Protein-calorie malnutrition, severe 02/10/2021   Pressure injury of skin  02/09/2021   Sepsis (HCC) 02/07/2021   Acute metabolic encephalopathy 11/07/2020   Severe sepsis (HCC) 11/07/2020   UTI (urinary tract infection) 11/07/2020   GERD (gastroesophageal reflux disease) 12/20/2017   Hyperlipemia 12/20/2017   History of schizophrenia 07/30/2017   Schizophrenia (HCC) 05/27/2017   Diabetes (HCC) 04/02/2017   Hypertension 04/02/2017   Noncompliance 04/02/2017   Depression 11/17/2013   PCP:  [facility] Pharmacy:   MEDICAL [address] [place], [place] - [address] [place] [address] Phone: [phone] Fax: [phone]  [id number], [place] - [address] AT [facility] OF [address] [facility] [place] [address] Phone: [phone] Fax: [phone]  [facility] [place], [place] - [address] [place] [address] Phone: [phone] Fax: [phone]     Social Determinants of Health (SDOH) Interventions    Readmission Risk Interventions No flowsheet data found.

## 2021-02-10 NOTE — Progress Notes (Addendum)
[facility] PROGRESS NOTE    [name]  ZLD:[id number] DOB: 22-Apr-1951 DOA: 02/07/2021 PCP: [name]      Brief Narrative:  Erica Mueller is a 70 y.o. F with CAD, DM, HTN, dementia, and schizophrenia, lives in [place] since May 2022 UTI sepsis who presented with acute somnolence for 1 day.  At baseline, patient able to ambluate and answer questions with one word answers but required assistance to bathe, dress, toilet and not able to engage in conversation.  Per family, she tested positive for COVID "2 weeks ago" and has steadily declined since then.  On the day of admission, she was found at her facility to be somnolent, sent to ER.  In the ER, tachycardic to 120s, tachypneic to 40s, hypotensive initially, GCS 5.  Na 166. Glu 260, Cr 1.4 up from baseline 0.5, Lactate 2.3.  UA suggestive of UTI, CXR clear. Started on fluids, antibiotics and admitted to ICU.      8/30: Admitted to ICU, nephrology consulted 8/31: Transferred to stepdown  9/2: NG tube placed         Assessment & Plan:  Severe sepsis likely due to aspiration pneumonia Presented with tachycardia, tachypnea, encephalopathy, cardiomyopathy and AKI.  Initially thought to be UTI, but culture negative, further chest imaging showed consolidation in RUL/RLL consistent with aspiration.    - Continue Rocephin    Hypernatremia Na 166 on admission, now down to 144 with IV fluids. - Stop IV fluids - Start tube feeds with free water  - Trend BMP  Resolved COVID >14 days out from infection.  Low utility for remdesivir at this point.  No hypoxia to indicate steroids.    Acute metabolic encephalopathy At baseline, the patietn lived with family until May, was semi-independent, had noted memory loss, but nothing significant per son.  After may admision, she has been more cognitively impaired.    Her encephalopathy here is still dense, she is nonverbal, does not follow commands.  Probably  multifactorial due to sepsis, hyponatremia, COVID, dementia. - Hold psychoactive meds amitriptyline, thorazine, thiothixine and Invega  Elevated troponin Cardiomyopathy Echo 8/30 showed reduced EF, new finding, without symptoms of CHF.  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  Pending goals of care, will consider further work up with Cardiology. At the moment, I do not believe she is a candidate for [facility].   Dysphagia Too somnolent to swallow safely.   - NPO  - Place temporary NG tube - Will meet with family this weekend, for goals of care - Consult SLP   Acute respiratory failure ruled out  Diabetes Glucose normal - Cotninue corrections - Hold metformin and sulfonylurea  Coronary disease Hypertension BP stable, normal - Hold lovastatin, metoprolol until clinical status improves  Schizophrenia - Hold amitriptyline, thorazine, thiothixine and Invega until mentation better  Possible dementia    Hypokalemia Mag normal, K repleted  Acute kidney injury Baseline Cr 0.6, here was more than doubled on admission >1.4 an dimproved to normal over 3 days with fluids  Stage II pressure injury sacrum, POA  Thrombocytopenia due to sepsis  Severe protein calorie malnutrition As evidenced by sevrely reduced muscle mass and fat, diffusely              Disposition: Status is: Inpatient  Remains inpatient appropriate because:Altered mental status and IV treatments appropriate due to intensity of illness or inability to take PO  Dispo: The patient is from: SNF  Anticipated d/c is to: SNF              Patient currently is not medically stable to d/c.   Difficult to place patient No   Level of care: Med-Surg  Patient presented with acute metabolic encephalopathy due to hypernatremia, renal insufficiency, and likely sepsis.  Sodium has been corrected, her sepsis meds stabilized, but she is still densely encephalopathic.  I suspect this is permanent  and terminal, and we will begin goals of care discussion with family.     MDM: The below labs and imaging reports were reviewed and summarized above.  Medication management as above.    DVT prophylaxis: heparin injection 5,000 Units Start: 02/07/21 2200 SCDs Start: 02/07/21 1805  Code Status: FULL Family Communication: son LC by phone    Consultants:  [facility] Nephrology  Procedures:  8/30 CTA chest -- no PE, bilateral infiltrates 8/30 Doppler US -- negative for DVT 8/30: echo: reduced EF, new finding  Antimicrobials:  Vancomycin Flagyl x1 on 8/30 Cefepime 8/30 >> 9/1 Rocephin 9/1>>  Culture data:   8/30 urine culture no growth 8/30 BCx ngtd          Subjective: Patient nonverbal.  No fever.  No new changes per nursing, no respiratory distress, seizures, agitation      Objective: Vitals:   02/10/21 0130 02/10/21 0335 02/10/21 0500 02/10/21 0805  BP: 127/64 (!) 124/58  136/67  Pulse: 92 87  88  Resp: 20 15  16   Temp: 97.8 F (36.6 C) 97.7 F (36.5 C)  97.7 F (36.5 C)  TempSrc: Oral Oral  Axillary  SpO2: 100% 99%  99%  Weight:   49.4 kg   Height:        Intake/Output Summary (Last 24 hours) at 02/10/2021 1523 Last data filed at 02/10/2021 0400 Gross per 24 hour  Intake 1115.46 ml  Output 250 ml  Net 865.46 ml   Filed Weights   02/07/21 1842 02/10/21 0500  Weight: 50 kg 49.4 kg    Examination: General appearance: Adult female, lying in bed, no acute distress     HEENT:, Conjunctive are pink, pupils equal and round, no eye contact, pupils reactive, NG tube in place, oropharynx moist, does not open mouth enough to examine oropharynx Skin: No suspicious rashes or lesions Cardiac: RRR, no murmurs, no lower extremity edema Respiratory: Normal respiratory rate and rhythm, lungs clear without rales or wheezes Abdomen: No palpation no ascites or distention MSK: Diffuse severely reduced muscle mass and fat Neuro: Equal and reactive, does not follow  commands, she is trying to pull out her NG tube, but she does not follow commands, no spontaneous verbalizations, face seems symmetric. Psych: No intelligible responses     Data Reviewed: I have personally reviewed following labs and imaging studies:  CBC: Recent Labs  Lab 02/07/21 1516 02/08/21 0405 02/09/21 0114 02/10/21 0930  WBC 17.2* 13.8* 9.1 6.8  NEUTROABS 15.3*  --   --   --   HGB 15.1* 12.9 11.7* 12.4  HCT 50.5* 43.9 38.3 39.5  MCV 100.4* 98.9 99.0 96.1  PLT 159 118* 111* 112*   Basic Metabolic Panel: Recent Labs  Lab 02/07/21 1516 02/07/21 2039 02/08/21 0405 02/08/21 0918 02/08/21 2048 02/09/21 0114 02/09/21 1242 02/09/21 2114 02/10/21 0930  NA 166* 163* 162*   < > 159* 159* 151* 149* 144  K 3.5 3.0* 3.7  --   --  2.8*  --   --  3.5  CL 126* 128* 128*  --   --  125*  --   --  111  CO2 25 28 29   --   --  28  --   --  28  GLUCOSE 360* 240* 70  --   --  89  --   --  188*  BUN 88* 62* 48*  --   --  26*  --   --  18  CREATININE 1.42* 1.14* 0.85  --   --  0.75  --   --  0.59  CALCIUM 9.8 8.5* 9.2  --   --  8.7*  --   --  7.8*  MG 3.1*  --  2.3  --   --  2.2  --   --  2.1  PHOS  --   --  1.6*  --   --  1.7*  --   --  2.5   < > = values in this interval not displayed.   GFR: Estimated Creatinine Clearance: 51 mL/min (by C-G formula based on SCr of 0.59 mg/dL). Liver Function Tests: Recent Labs  Lab 02/07/21 1516 02/08/21 0850 02/09/21 0114 02/10/21 0930  AST 14* 20 33 38  ALT 12 11 13 19   ALKPHOS 57 46 45 50  BILITOT 1.0 0.6 0.5 0.6  PROT 8.2* 6.5 6.3* 6.1*  ALBUMIN 3.4* 2.7* 2.4* 2.3*   No results for input(s): LIPASE, AMYLASE in the last 168 hours. No results for input(s): AMMONIA in the last 168 hours. Coagulation Profile: Recent Labs  Lab 02/07/21 1516  INR 1.5*   Cardiac Enzymes: No results for input(s): CKTOTAL, CKMB, CKMBINDEX, TROPONINI in the last 168 hours. BNP (last 3 results) No results for input(s): PROBNP in the last 8760  hours. HbA1C: Recent Labs    02/07/21 1537  HGBA1C 7.9*   CBG: Recent Labs  Lab 02/09/21 2031 02/09/21 2320 02/10/21 0704 02/10/21 0807 02/10/21 1111  GLUCAP 122* 103* 143* 143* 176*   Lipid Profile: No results for input(s): CHOL, HDL, LDLCALC, TRIG, CHOLHDL, LDLDIRECT in the last 72 hours. Thyroid Function Tests: No results for input(s): TSH, T4TOTAL, FREET4, T3FREE, THYROIDAB in the last 72 hours.  Anemia Panel: No results for input(s): VITAMINB12, FOLATE, FERRITIN, TIBC, IRON, RETICCTPCT in the last 72 hours. Urine analysis:    Component Value Date/Time   COLORURINE YELLOW (A) 02/07/2021 1650   APPEARANCEUR CLOUDY (A) 02/07/2021 1650   LABSPEC 1.026 02/07/2021 1650   PHURINE 5.0 02/07/2021 1650   GLUCOSEU 50 (A) 02/07/2021 1650   HGBUR MODERATE (A) 02/07/2021 1650   BILIRUBINUR NEGATIVE 02/07/2021 1650   KETONESUR 5 (A) 02/07/2021 1650   PROTEINUR 30 (A) 02/07/2021 1650   NITRITE NEGATIVE 02/07/2021 1650   LEUKOCYTESUR MODERATE (A) 02/07/2021 1650   Sepsis Labs: @LABRCNTIP (procalcitonin:4,lacticacidven:4)  ) Recent Results (from the past 240 hour(s))  Resp Panel by RT-PCR (Flu A&B, Covid) Nasopharyngeal Swab     Status: Abnormal   Collection Time: 02/07/21  3:16 PM   Specimen: Nasopharyngeal Swab; Nasopharyngeal(NP) swabs in vial transport medium  Result Value Ref Range Status   SARS Coronavirus 2 by RT PCR POSITIVE (A) NEGATIVE Final    Comment: RESULT CALLED TO, READ BACK BY AND VERIFIED WITH: MATT BASSETT 02/07/21 1644 AMK (NOTE) SARS-CoV-2 target nucleic acids are DETECTED.  The SARS-CoV-2 RNA is generally detectable in upper respiratory specimens during the acute phase of infection. Positive results are indicative of the presence of the identified virus, but do not rule out bacterial infection or co-infection with other pathogens not detected by the test. Clinical  correlation with patient history and other diagnostic information is necessary to  determine patient infection status. The expected result is Negative.  Fact Sheet for Patients: BloggerCourse.com  Fact Sheet for Healthcare Providers: SeriousBroker.it  This test is not yet approved or cleared by the [place] FDA and  has been authorized for detection and/or diagnosis of SARS-CoV-2 by FDA under an Emergency Use Authorization (EUA).  This EUA will remain in effect (meaning this test can be use d) for the duration of  the COVID-19 declaration under Section 564(b)(1) of the Act, 21 U.S.C. section 360bbb-3(b)(1), unless the authorization is terminated or revoked sooner.     Influenza A by PCR NEGATIVE NEGATIVE Final   Influenza B by PCR NEGATIVE NEGATIVE Final    Comment: (NOTE) The Xpert Xpress SARS-CoV-2/FLU/RSV plus assay is intended as an aid in the diagnosis of influenza from Nasopharyngeal swab specimens and should not be used as a sole basis for treatment. Nasal washings and aspirates are unacceptable for Xpert Xpress SARS-CoV-2/FLU/RSV testing.  Fact Sheet for Patients: BloggerCourse.com  Fact Sheet for Healthcare Providers: SeriousBroker.it  This test is not yet approved or cleared by the [place] FDA and has been authorized for detection and/or diagnosis of SARS-CoV-2 by FDA under an Emergency Use Authorization (EUA). This EUA will remain in effect (meaning this test can be used) for the duration of the COVID-19 declaration under Section 564(b)(1) of the Act, 21 U.S.C. section 360bbb-3(b)(1), unless the authorization is terminated or revoked.  Performed at [facility], [address] [place], [place] [address]   Blood culture (routine single)     Status: None (Preliminary result)   Collection Time: 02/07/21  3:37 PM   Specimen: BLOOD  Result Value Ref Range Status   Specimen Description BLOOD RIGHT ANTECUBITAL  Final   Special  Requests   Final    BOTTLES DRAWN AEROBIC AND ANAEROBIC Blood Culture adequate volume   Culture   Final    NO GROWTH 3 DAYS Performed at [facility], [address], [place], [place] [address]    Report Status PENDING  Incomplete  Culture, blood (single)     Status: None (Preliminary result)   Collection Time: 02/07/21  3:50 PM   Specimen: BLOOD  Result Value Ref Range Status   Specimen Description BLOOD LEFT ASSIST CONTROL  Final   Special Requests   Final    BOTTLES DRAWN AEROBIC AND ANAEROBIC Blood Culture adequate volume   Culture   Final    NO GROWTH 3 DAYS Performed at [facility], [address] [place], [place] [address]    Report Status PENDING  Incomplete  Urine Culture     Status: None   Collection Time: 02/07/21  4:50 PM   Specimen: In/Out Cath Urine  Result Value Ref Range Status   Specimen Description   Final    IN/OUT CATH URINE Performed at [facility], [address] [place], [place] [address]    Special Requests   Final    NONE Performed at [facility], [address] [place], [place] [address]    Culture   Final    NO GROWTH Performed at [facility], [address], [place], [place] [address]    Report Status 02/09/2021 FINAL  Final  MRSA Next Gen by PCR, Nasal     Status: None   Collection Time: 02/09/21  9:47 AM   Specimen: Nasal Mucosa; Nasal Swab  Result Value Ref Range Status  MRSA by PCR Next Gen NOT DETECTED NOT DETECTED Final    Comment: (NOTE) The GeneXpert MRSA Assay (FDA approved for NASAL specimens only), is one component of a comprehensive MRSA colonization surveillance program. It is not intended to diagnose MRSA infection nor to guide or monitor treatment for MRSA infections. Test performance is not FDA approved in patients less than 72 years old. Performed at [facility], [address], [place], [place] [address]          Radiology Studies: [facility] Chest [place] 1  View  Result Date: 02/10/2021 CLINICAL DATA:  Nasogastric tube placement. EXAM: PORTABLE CHEST 1 VIEW COMPARISON:  Radiographs 02/07/2021 and 11/07/2020. FINDINGS: 1233 hours. Enteric tube projects over the left upper quadrant of the stomach, consistent with location in the proximal stomach. The stomach is mildly distended with air. No evidence of bowel wall thickening or pneumoperitoneum. The heart is mildly enlarged. Telemetry leads overlie the chest and upper abdomen. IMPRESSION: Enteric tube overlies the proximal stomach. Electronically Signed   By: Carey Bullocks M.D.   On: 02/10/2021 15:00        Scheduled Meds:  vitamin C  500 mg Oral Daily   chlorhexidine  15 mL Mouth Rinse BID   Chlorhexidine Gluconate Cloth  6 each Topical Daily   free water  100 mL Per Tube Q4H   heparin  5,000 Units Subcutaneous Q8H   insulin aspart  0-15 Units Subcutaneous Q4H   mouth rinse  15 mL Mouth Rinse q12n4p   Continuous Infusions:  cefTRIAXone (ROCEPHIN)  IV 2 g (02/10/21 1522)   dextrose 75 mL/hr at 02/10/21 0330   feeding supplement (OSMOLITE 1.2 CAL) 1,000 mL (02/10/21 1518)   remdesivir 100 mg in NS 100 mL 100 mg (02/10/21 1329)     LOS: 3 days    Time spent: 25 minutes    Alberteen Sam, MD [facility] 02/10/2021, 3:23 PM     Please page though AMION or Epic secure chat:  For [employer], Higher education careers adviser

## 2021-02-10 NOTE — Progress Notes (Signed)
Initial Nutrition Assessment  DOCUMENTATION CODES:  Severe malnutrition in context of social or environmental circumstances  INTERVENTION:  Continue NPO status until pt about to eat safely Recommend initiation of enteral feeds via NGT once placement is confirmed. Recommend the following: Osmolite 1.2 @ 34mL/h (1.32L/d) free water q4h Regimen will provide 1584kcal, 73g of protein, and of free water (TF+flush) Pt is at high refeeding risk, monitor K, phosphorus, and Mg x 3 days.  Request new measured weight  NUTRITION DIAGNOSIS:  Severe Malnutrition related to social / environmental circumstances as evidenced by severe fat depletion, severe muscle depletion.  GOAL:  Patient will meet greater than or equal to 90% of their needs  MONITOR:  TF tolerance, Skin, Labs, I & O's  REASON FOR ASSESSMENT:  Malnutrition Screening Tool    ASSESSMENT:  70 y.o. female with medical history of HTN, DM, schizophrenia, depression, CAD, GERD, and HLD presented to ED from [facility] healthcare via EMS for AMS. Pt was found to be quite dehydrated in ED and UA concerning for infection.  Initially admitted to ICU but transferred to floor 9/1. Pt has been with AMS and poor mentation since admission, has not been able to take in POs this admission. SLP evaluated this AM and recommended pt stay NPO as she was not alert enough to assess. Palliative care consulting and working with family on GOC. For now, family wishes to continue aggressive care. Discussed with MD, will place NGT to provide nutrition and free water for pt and give access for medication administration.   Pt currently receiving D5 to maintain glucose levels and help bring down Na. Providing some nutrition (306 kcal/d) Very elevated on admission, but is trending down. Reading this AM in a normal range.    Noted no BM recorded since PTA. MD to add suppository to stimulate BM.   Pt resting in bed at the time of visit, NGT in place,  awaiting XR confirmation for placement. Pt lethargic, opened her eyes during physical exam, but did not speak. Prior to leaving room, pt was touching NGT. Repositioned blankets around pt's hands and arms and encouraged her to rest, alerted RN. Would likely benefit from mitten placement to prevent pt from pulling NGT out.  Nutritionally Relevant Medications: Scheduled Meds:  vitamin C  500 mg Oral Daily   insulin aspart  0-15 Units Subcutaneous Q4H   Continuous Infusions:  dextrose 75 mL/hr at 02/10/21 0330   PRN Meds: docusate sodium, ondansetron, polyethylene glycol  Labs Reviewed: SBG ranges from 108-176 mg/dL over the last 24 hours HgbA1c: 7/9% (8/30)  NUTRITION - FOCUSED PHYSICAL EXAM: Flowsheet Row Most Recent Value  Orbital Region Moderate depletion  Upper Arm Region Severe depletion  Thoracic and Lumbar Region Severe depletion  Buccal Region Moderate depletion  Temple Region Moderate depletion  Clavicle Bone Region Moderate depletion  Clavicle and Acromion Bone Region Severe depletion  Scapular Bone Region Severe depletion  Dorsal Hand Moderate depletion  Patellar Region Severe depletion  Anterior Thigh Region Severe depletion  Posterior Calf Region Moderate depletion  Edema (RD Assessment) None  Hair Reviewed  Eyes Reviewed  Mouth Reviewed  Skin Reviewed  Nails Reviewed   Diet Order:   Diet Order             Diet NPO time specified  Diet effective now                   EDUCATION NEEDS:  No education needs have been identified at this  time  Skin:  Skin Assessment: Skin Integrity Issues: Skin Integrity Issues:: Stage II Stage II: sacrum  Last BM:  PTA  Height:  Ht Readings from Last 1 Encounters:  02/07/21 5\' 2"  (1.575 m)   Weight:  Wt Readings from Last 1 Encounters:  02/10/21 49.4 kg   Ideal Body Weight:  50 kg  BMI:  Body mass index is 19.92 kg/m.  Estimated Nutritional Needs:  Kcal:  1500-1700 kcal/d Protein:  75-85 g/d Fluid:   1.5-1.8 L/d   04/12/21, RD, LDN Clinical Dietitian Pager on Amion

## 2021-02-10 NOTE — Progress Notes (Signed)
Mitts placed on pt so pt does not pull out NG tube.  NG tube placed For nutritional value.

## 2021-02-11 ENCOUNTER — Inpatient Hospital Stay: Payer: Medicare Other

## 2021-02-11 DIAGNOSIS — N179 Acute kidney failure, unspecified: Secondary | ICD-10-CM | POA: Diagnosis not present

## 2021-02-11 DIAGNOSIS — R6520 Severe sepsis without septic shock: Secondary | ICD-10-CM | POA: Diagnosis not present

## 2021-02-11 DIAGNOSIS — A419 Sepsis, unspecified organism: Secondary | ICD-10-CM | POA: Diagnosis not present

## 2021-02-11 LAB — COMPREHENSIVE METABOLIC PANEL
ALT: 26 U/L (ref 0–44)
AST: 50 U/L — ABNORMAL HIGH (ref 15–41)
Albumin: 2.1 g/dL — ABNORMAL LOW (ref 3.5–5.0)
Alkaline Phosphatase: 48 U/L (ref 38–126)
Anion gap: 7 (ref 5–15)
BUN: 14 mg/dL (ref 8–23)
CO2: 29 mmol/L (ref 22–32)
Calcium: 8.1 mg/dL — ABNORMAL LOW (ref 8.9–10.3)
Chloride: 108 mmol/L (ref 98–111)
Creatinine, Ser: 0.68 mg/dL (ref 0.44–1.00)
GFR, Estimated: >60 mL/min (ref >60)
Glucose, Bld: 221 mg/dL — ABNORMAL HIGH (ref 70–99)
Potassium: 3.1 mmol/L — ABNORMAL LOW (ref 3.5–5.1)
Sodium: 144 mmol/L (ref 135–145)
Total Bilirubin: 0.5 mg/dL (ref 0.3–1.2)
Total Protein: 5.7 g/dL — ABNORMAL LOW (ref 6.5–8.1)

## 2021-02-11 LAB — PHOSPHORUS: Phosphorus: 2.3 mg/dL — ABNORMAL LOW (ref 2.5–4.6)

## 2021-02-11 LAB — GLUCOSE, CAPILLARY
Glucose-Capillary: 241 mg/dL — ABNORMAL HIGH (ref 70–99)
Glucose-Capillary: 217 mg/dL — ABNORMAL HIGH (ref 70–99)
Glucose-Capillary: 153 mg/dL — ABNORMAL HIGH (ref 70–99)
Glucose-Capillary: 218 mg/dL — ABNORMAL HIGH (ref 70–99)
Glucose-Capillary: 255 mg/dL — ABNORMAL HIGH (ref 70–99)
Glucose-Capillary: 147 mg/dL — ABNORMAL HIGH (ref 70–99)

## 2021-02-11 LAB — MAGNESIUM: Magnesium: 2 mg/dL (ref 1.7–2.4)

## 2021-02-11 MED ORDER — POTASSIUM CHLORIDE 20 MEQ PO PACK
40.0000 meq | PACK | Freq: Once | ORAL | Status: AC
  Administered 2021-02-11: 40 meq
  Filled 2021-02-11: qty 2

## 2021-02-11 MED ORDER — SODIUM CHLORIDE 0.9 % IV SOLN
2.0000 g | INTRAVENOUS | Status: AC
  Administered 2021-02-11 – 2021-02-13 (×3): 2 g via INTRAVENOUS
  Filled 2021-02-11 (×2): qty 2
  Filled 2021-02-11: qty 20

## 2021-02-11 MED ORDER — INSULIN GLARGINE-YFGN 100 UNIT/ML ~~LOC~~ SOLN
8.0000 [IU] | Freq: Every day | SUBCUTANEOUS | Status: DC
  Administered 2021-02-11 – 2021-02-12 (×2): 8 [IU] via SUBCUTANEOUS
  Filled 2021-02-11 (×2): qty 0.08

## 2021-02-11 MED ORDER — POTASSIUM PHOSPHATES 15 MMOLE/5ML IV SOLN
30.0000 mmol | Freq: Once | INTRAVENOUS | Status: AC
  Administered 2021-02-11: 30 mmol via INTRAVENOUS
  Filled 2021-02-11: qty 10

## 2021-02-11 NOTE — Evaluation (Signed)
Clinical/Bedside Swallow Evaluation Patient Details  Name: Erica Mueller MRN: [id number] Date of Birth: Oct 14, 1950  Today's Date: 02/11/2021 Time: SLP Start Time (ACUTE ONLY): 1005 SLP Stop Time (ACUTE ONLY): 1055 SLP Time Calculation (min) (ACUTE ONLY): 50 min  Past Medical History:  Past Medical History:  Diagnosis Date   Coronary artery disease    Depression    Diabetes mellitus without complication (HCC)    Elevated lipids    GERD (gastroesophageal reflux disease)    Hypertension    Schizophrenia (HCC)    Syphilis (acquired)    Past Surgical History:  Past Surgical History:  Procedure Laterality Date   COLONOSCOPY     COLONOSCOPY WITH PROPOFOL N/A 09/01/2015   Procedure: COLONOSCOPY WITH PROPOFOL;  Surgeon: Wallace Cullens, MD;  Location: [facility] END[facility]COPY;  Service: Gastroenterology;  Laterality: N/A;   TUBAL LIGATION     wisdom teeth removal     HPI:  [name] is a 70 y.o. F with CAD, DM, HTN, dementia, and schizophrenia, lives in [place] since May 2022 UTI sepsis who presented with acute somnolence for 1 day. Per family, she tested positive for COVID "2 weeks ago" and has steadily declined since then. Chest CT revealed emphysema with partial consolidations in the right upper lobe and  right lower lobe concerning for pneumonia and or aspiration.   Assessment / Plan / Recommendation Clinical Impression  Pt appears to present w/ oral phase dysphagia in light of declined Cognitive status; Baseline Dementia w/ psychiatric illness also. This can impact her overall awareness/timing of swallow and safety during po tasks which increases risk for pharyngeal phase deficits including aspiration, choking. Pt's risk for aspiration is present but can be reduced when following general aspiration precautions, given feeding assistance, and when using a modified diet consistency. She required MOD verbal/tactile/visual cues for during po tasks. She was responsive to stim but did not initiate of engage in po  tasks spontaneously. Pt also has a NGT for TFs at this time which limits trial consistencies assessed. No solids were attempted.  Pt consumed only trials of ice chips, purees, and thin liquids w/ No immediate, overt clinical s/s of aspiration noted. No immediate, overt coughing or decline in respiratory status during/post trials. Pt nonverbal. She did exhibit oral prep and awareness deficits requiring MOD tactile cues of utensil at lips to stim mouth opening at times. Oral phase was grossly adequate for bolus management and oral clearing of the boluses given. Pt did not attempt to feed self. OM Exam was cursory but appeared [facility] w/ No unilateral weakness noted. Confusion of OM tasks and oral care given preventing full assessment.    Recommend only Pleasure trials of ice chips w/ frequent oral care b/f any po's while pt has NGT in place. Once NGT can be removed, then recommend trial initiation of thin liquids and puree foods for oral diet w/ strict aspiration precautions and feeding support at all meals for guidance and monitoring of po's. Recommend pills Crushed in puree. ST services will continue to f/u w/ pt's status. NSG updated on above and the recommendations and agreed. SLP Visit Diagnosis: Dysphagia, oropharyngeal phase (R13.12) (Cognitive decline)    Aspiration Risk  Mild aspiration risk;Moderate aspiration risk;Risk for inadequate nutrition/hydration    Diet Recommendation   Pleasure trials of ice chips w/ frequent oral care b/f any po's while pt has NGT in place. 100% supervision.  Medication Administration: Via alternative means (NGT)    Other  Recommendations Recommended Consults:  (Dietician f/u;  Palliative Care f/u for GOC and education) Oral Care Recommendations: Oral care QID;Oral care prior to ice chip/H20;Staff/trained caregiver to provide oral care;Oral care before and after PO   Follow up Recommendations Skilled Nursing facility (TBD)      Frequency and Duration min 2x/week  2  weeks       Prognosis Prognosis for Safe Diet Advancement: Fair Barriers to Reach Goals: Cognitive deficits;Language deficits;Time post onset;Severity of deficits;Behavior      Swallow Study   General Date of Onset: 02/07/21 HPI: [name] is a 70 y.o. F with CAD, DM, HTN, dementia, and schizophrenia, lives in [place] since May 2022 UTI sepsis who presented with acute somnolence for 1 day. Per family, she tested positive for COVID "2 weeks ago" and has steadily declined since then. Chest CT revealed emphysema with partial consolidations in the right upper lobe and  right lower lobe concerning for pneumonia and or aspiration. Type of Study: Bedside Swallow Evaluation Previous Swallow Assessment: none in chart Diet Prior to this Study: NPO;NG Tube Temperature Spikes Noted: No (wbc 6.8) Respiratory Status: Room air History of Recent Intubation: No Behavior/Cognition: Alert;Cooperative;Pleasant mood;Confused;Distractible;Requires cueing;Doesn't follow directions (Mod-Max) Oral Cavity Assessment: Dry Oral Care Completed by SLP: Yes Oral Cavity - Dentition: Poor condition;Missing dentition Vision:  (n/a) Self-Feeding Abilities: Total assist (positioned) Patient Positioning: Upright in bed (positioned) Baseline Vocal Quality:  (n/a) Volitional Cough: Cognitively unable to elicit Volitional Swallow: Unable to elicit    Oral/Motor/Sensory Function Overall Oral Motor/Sensory Function: Within functional limits (movements appeared wfl w/ bolus management; ROM)   Ice Chips Ice chips: Within functional limits (grossly) Presentation: Spoon (fed; 10 trials) Other Comments: min reduced awareness   Thin Liquid Thin Liquid: Impaired Presentation: Spoon;Straw (12 trials total; 3 via spoon) Oral Phase Impairments: Poor awareness of bolus Oral Phase Functional Implications:  (oral prep dec'd awareness) Pharyngeal  Phase Impairments:  (none) Other Comments: reduced oral awareness    Nectar Thick Nectar  Thick Liquid: Not tested   Honey Thick Honey Thick Liquid: Not tested   Puree Puree: Impaired Presentation: Spoon (fed; 6 trials) Oral Phase Impairments: Poor awareness of bolus (oral prep) Oral Phase Functional Implications:  (adequate) Pharyngeal Phase Impairments:  (none)   Solid     Solid: Not tested        Jerilynn Som, MS, CCC-SLP Speech Language Pathologist Rehab Services [phone] [facility] 02/11/2021,11:00 AM

## 2021-02-11 NOTE — Progress Notes (Signed)
[facility] PROGRESS NOTE    Erica Mueller  QIO:[id number] DOB: [birth year] DOA: 02/07/2021 PCP: [name]      Brief Narrative:  [name] is a 70 y.o. F with CAD, DM, HTN, dementia, and schizophrenia, lives in [place] since May 2022 UTI sepsis who presented with acute somnolence for 1 day.  At baseline, patient able to ambluate and answer questions with one word answers but required assistance to bathe, dress, toilet and not able to engage in conversation.  Per family, she tested positive for COVID "2 weeks ago" and has steadily declined since then.  On the day of admission, she was found at her facility to be somnolent, sent to ER.  In the ER, tachycardic to 120s, tachypneic to 40s, hypotensive initially, GCS 5.  Na 166. Glu 260, Cr 1.4 up from baseline 0.5, Lactate 2.3.  UA suggestive of UTI, CXR clear. Started on fluids, antibiotics and admitted to ICU.      8/30: Admitted to ICU, nephrology consulted 8/31: Transferred to stepdown  9/2: NG tube placed         Assessment & Plan:  Severe sepsis likely due to aspiration pneumonia Presented with tachycardia, tachypnea, encephalopathy, cardiomyopathy and AKI.  Initially thought to be UTI, but culture negative, further chest imaging showed consolidation in RUL/RLL consistent with aspiration.    - Continue Rocephin, day 5 of 7    Hypernatremia Na 166 on admission, now resolved. - Continue free water - Trend BMP  Hypokalemia Hypophosphatemia - Replete with Kphos and Ksolution  Resolved COVID >14 days out from infection.  Low utility for remdesivir at this point.  No hypoxia to indicate steroids.    Acute metabolic encephalopathy At baseline, the patietn lived with family until May, was semi-independent, had noted memory loss, but nothing significant per son.  After may admision, she has been more cognitively impaired.    Her encephalopathy here is still dense, she is nonverbal, does not follow  commands.  Probably multifactorial due to sepsis, hyponatremia, COVID, dementia. No significant change  - Hold psychoactive meds amitriptyline, thorazine, thiothixine and Invega    Elevated troponin Cardiomyopathy Echo 8/30 showed reduced EF, new finding, without symptoms of CHF.  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  Pending goals of care, will consider further work up with Cardiology. At the moment, I do not believe she is a candidate for LHC. - Monitor clinically  Dysphagia Swallowing dysfunction is mostly cognitive - NPO  - Continue temporary NG - Will meet with family this weekend, for goals of care - Consult SLP   Acute respiratory failure ruled out  Diabetes Elevated - Continue corrections - Start lantus - Hold metformin and sulfonylurea  Coronary disease Hypertension BP stable, normal - Hold lovastatin, metoprolol until clinical status improves  Schizophrenia - Hold amitriptyline, thorazine, thiothixine and Invega until mentation better  Possible dementia       Acute kidney injury Baseline Cr 0.6, here was more than doubled on admission >1.4 an dimproved to normal over 3 days with fluids  Stage II pressure injury sacrum, POA  Thrombocytopenia due to sepsis  Severe protein calorie malnutrition As evidenced by sevrely reduced muscle mass and fat, diffusely              Disposition: Status is: Inpatient  Remains inpatient appropriate because:Altered mental status and IV treatments appropriate due to intensity of illness or inability to take PO  Dispo: The patient is from: SNF  Anticipated d/c is to: SNF              Patient currently is not medically stable to d/c.   Difficult to place patient No   Level of care: Med-Surg  Patient presented with acute metabolic encephalopathy due to hypernatremia, renal insufficiency, and likely sepsis.  Sodium has been corrected, her sepsis has been stabilized, but she is still  densely encephalopathic.  I suspect this is permanent and terminal, and we will continue supportive cares and facilitate goals fo care discussions with family       MDM: The below labs and imaging reports were reviewed and summarized above.  Medication management as above.    DVT prophylaxis: heparin injection 5,000 Units Start: 02/07/21 2200 SCDs Start: 02/07/21 1805  Code Status: FULL Family Communication:      Consultants:  [facility] Nephrology  Procedures:  8/30 CTA chest -- no PE, bilateral infiltrates 8/30 Doppler US -- negative for DVT 8/30: echo: reduced EF, new finding  Antimicrobials:  Vancomycin Flagyl x1 on 8/30 Cefepime 8/30 >> 9/1 Rocephin 9/1>>  Culture data:   8/30 urine culture no growth 8/30 BCx ngtd          Subjective: Nursing report no fever, respiratory distress, agitation, seizures.  She is nonverbal.        Objective: Vitals:   02/10/21 2229 02/11/21 0346 02/11/21 0500 02/11/21 0807  BP: (!) 138/57 (!) 129/55  131/66  Pulse: 80 81  89  Resp: 20 20  14   Temp: 97.6 F (36.4 C) 97.6 F (36.4 C)  97.7 F (36.5 C)  TempSrc:      SpO2: 97% 99%  98%  Weight:   51.4 kg   Height:        Intake/Output Summary (Last 24 hours) at 02/11/2021 1213 Last data filed at 02/11/2021 0809 Gross per 24 hour  Intake 820.17 ml  Output 1850 ml  Net -1029.83 ml   Filed Weights   02/07/21 1842 02/10/21 0500 02/11/21 0500  Weight: 50 kg 49.4 kg 51.4 kg    Examination: General appearance: Adult female, lying in bed, no acute distress, not very responsive     HEENT: NG tube in place Skin: No suspicious rashes or lesions Cardiac: RRR, no murmurs, no lower extremity edema Respiratory: Normal respiratory rate and rhythm, lungs clear without rales or wheezes Abdomen: No grimace to palpation, no ascites or distention MSK: Diffuse severely reduced muscle mass and fat Neuro: Makes eye contact, but does not track me does move around the room.  Does not  follow commands, no spontaneous verbalizations or spontaneous movements today, face symmetric, she is chewing. Psych: No intelligible responses     Data Reviewed: I have personally reviewed following labs and imaging studies:  CBC: Recent Labs  Lab 02/07/21 1516 02/08/21 0405 02/09/21 0114 02/10/21 0930  WBC 17.2* 13.8* 9.1 6.8  NEUTROABS 15.3*  --   --   --   HGB 15.1* 12.9 11.7* 12.4  HCT 50.5* 43.9 38.3 39.5  MCV 100.4* 98.9 99.0 96.1  PLT 159 118* 111* 112*   Basic Metabolic Panel: Recent Labs  Lab 02/07/21 1516 02/07/21 2039 02/08/21 0405 02/08/21 0918 02/09/21 0114 02/09/21 1242 02/09/21 2114 02/10/21 0930 02/11/21 0601  NA 166* 163* 162*   < > 159* 151* 149* 144 144  K 3.5 3.0* 3.7  --  2.8*  --   --  3.5 3.1*  CL 126* 128* 128*  --  125*  --   --  111 108  CO2 25 28 29   --  28  --   --  28 29  GLUCOSE 360* 240* 70  --  89  --   --  188* 221*  BUN 88* 62* 48*  --  26*  --   --  18 14  CREATININE 1.42* 1.14* 0.85  --  0.75  --   --  0.59 0.68  CALCIUM 9.8 8.5* 9.2  --  8.7*  --   --  7.8* 8.1*  MG 3.1*  --  2.3  --  2.2  --   --  2.1 2.0  PHOS  --   --  1.6*  --  1.7*  --   --  2.5 2.3*   < > = values in this interval not displayed.   GFR: Estimated Creatinine Clearance: 51.8 mL/min (by C-G formula based on SCr of 0.68 mg/dL). Liver Function Tests: Recent Labs  Lab 02/07/21 1516 02/08/21 0850 02/09/21 0114 02/10/21 0930 02/11/21 0601  AST 14* 20 33 38 50*  ALT 12 11 13 19 26   ALKPHOS 57 46 45 50 48  BILITOT 1.0 0.6 0.5 0.6 0.5  PROT 8.2* 6.5 6.3* 6.1* 5.7*  ALBUMIN 3.4* 2.7* 2.4* 2.3* 2.1*   No results for input(s): LIPASE, AMYLASE in the last 168 hours. No results for input(s): AMMONIA in the last 168 hours. Coagulation Profile: Recent Labs  Lab 02/07/21 1516  INR 1.5*   Cardiac Enzymes: No results for input(s): CKTOTAL, CKMB, CKMBINDEX, TROPONINI in the last 168 hours. BNP (last 3 results) No results for input(s): PROBNP in the last  8760 hours. HbA1C: No results for input(s): HGBA1C in the last 72 hours.  CBG: Recent Labs  Lab 02/10/21 1527 02/10/21 2224 02/10/21 2329 02/11/21 0331 02/11/21 0916  GLUCAP 143* 248* 245* 241* 217*   Lipid Profile: No results for input(s): CHOL, HDL, LDLCALC, TRIG, CHOLHDL, LDLDIRECT in the last 72 hours. Thyroid Function Tests: No results for input(s): TSH, T4TOTAL, FREET4, T3FREE, THYROIDAB in the last 72 hours.  Anemia Panel: No results for input(s): VITAMINB12, FOLATE, FERRITIN, TIBC, IRON, RETICCTPCT in the last 72 hours. Urine analysis:    Component Value Date/Time   COLORURINE YELLOW (A) 02/07/2021 1650   APPEARANCEUR CLOUDY (A) 02/07/2021 1650   LABSPEC 1.026 02/07/2021 1650   PHURINE 5.0 02/07/2021 1650   GLUCOSEU 50 (A) 02/07/2021 1650   HGBUR MODERATE (A) 02/07/2021 1650   BILIRUBINUR NEGATIVE 02/07/2021 1650   KETONESUR 5 (A) 02/07/2021 1650   PROTEINUR 30 (A) 02/07/2021 1650   NITRITE NEGATIVE 02/07/2021 1650   LEUKOCYTESUR MODERATE (A) 02/07/2021 1650   Sepsis Labs: @LABRCNTIP (procalcitonin:4,lacticacidven:4)  ) Recent Results (from the past 240 hour(s))  Resp Panel by RT-PCR (Flu A&B, Covid) Nasopharyngeal Swab     Status: Abnormal   Collection Time: 02/07/21  3:16 PM   Specimen: Nasopharyngeal Swab; Nasopharyngeal(NP) swabs in vial transport medium  Result Value Ref Range Status   SARS Coronavirus 2 by RT PCR POSITIVE (A) NEGATIVE Final    Comment: RESULT CALLED TO, READ BACK BY AND VERIFIED WITH: MATT BASSETT 02/07/21 1644 AMK (NOTE) SARS-CoV-2 target nucleic acids are DETECTED.  The SARS-CoV-2 RNA is generally detectable in upper respiratory specimens during the acute phase of infection. Positive results are indicative of the presence of the identified virus, but do not rule out bacterial infection or co-infection with other pathogens not detected by the test. Clinical correlation with patient history and other diagnostic information is  necessary to determine  patient infection status. The expected result is Negative.  Fact Sheet for Patients: BloggerCourse.com  Fact Sheet for Healthcare Providers: SeriousBroker.it  This test is not yet approved or cleared by the [place] FDA and  has been authorized for detection and/or diagnosis of SARS-CoV-2 by FDA under an Emergency Use Authorization (EUA).  This EUA will remain in effect (meaning this test can be use d) for the duration of  the COVID-19 declaration under Section 564(b)(1) of the Act, 21 U.S.C. section 360bbb-3(b)(1), unless the authorization is terminated or revoked sooner.     Influenza A by PCR NEGATIVE NEGATIVE Final   Influenza B by PCR NEGATIVE NEGATIVE Final    Comment: (NOTE) The Xpert Xpress SARS-CoV-2/FLU/RSV plus assay is intended as an aid in the diagnosis of influenza from Nasopharyngeal swab specimens and should not be used as a sole basis for treatment. Nasal washings and aspirates are unacceptable for Xpert Xpress SARS-CoV-2/FLU/RSV testing.  Fact Sheet for Patients: BloggerCourse.com  Fact Sheet for Healthcare Providers: SeriousBroker.it  This test is not yet approved or cleared by the [place] FDA and has been authorized for detection and/or diagnosis of SARS-CoV-2 by FDA under an Emergency Use Authorization (EUA). This EUA will remain in effect (meaning this test can be used) for the duration of the COVID-19 declaration under Section 564(b)(1) of the Act, 21 U.S.C. section 360bbb-3(b)(1), unless the authorization is terminated or revoked.  Performed at [facility], [address] [place], [place] [address]   Blood culture (routine single)     Status: None (Preliminary result)   Collection Time: 02/07/21  3:37 PM   Specimen: BLOOD  Result Value Ref Range Status   Specimen Description BLOOD RIGHT ANTECUBITAL   Final   Special Requests   Final    BOTTLES DRAWN AEROBIC AND ANAEROBIC Blood Culture adequate volume   Culture   Final    NO GROWTH 4 DAYS Performed at [facility], [address], [place], [place] [address]    Report Status PENDING  Incomplete  Culture, blood (single)     Status: None (Preliminary result)   Collection Time: 02/07/21  3:50 PM   Specimen: BLOOD  Result Value Ref Range Status   Specimen Description BLOOD LEFT ASSIST CONTROL  Final   Special Requests   Final    BOTTLES DRAWN AEROBIC AND ANAEROBIC Blood Culture adequate volume   Culture   Final    NO GROWTH 4 DAYS Performed at [facility], [address] [place], [place] [address]    Report Status PENDING  Incomplete  Urine Culture     Status: None   Collection Time: 02/07/21  4:50 PM   Specimen: In/Out Cath Urine  Result Value Ref Range Status   Specimen Description   Final    IN/OUT CATH URINE Performed at [facility], [address], [place], [place] [address]    Special Requests   Final    NONE Performed at [facility], [address] [place], [place] [address]    Culture   Final    NO GROWTH Performed at [facility], [address] [place], [place] [address]    Report Status 02/09/2021 FINAL  Final  MRSA Next Gen by PCR, Nasal     Status: None   Collection Time: 02/09/21  9:47 AM   Specimen: Nasal Mucosa; Nasal Swab  Result Value Ref Range Status   MRSA by PCR Next Gen NOT DETECTED NOT DETECTED Final  Comment: (NOTE) The GeneXpert MRSA Assay (FDA approved for NASAL specimens only), is one component of a comprehensive MRSA colonization surveillance program. It is not intended to diagnose MRSA infection nor to guide or monitor treatment for MRSA infections. Test performance is not FDA approved in patients less than 69 years old. Performed at [facility], [address] [place], [place] [address]          Radiology Studies: [facility]  Chest [place] 1 View  Result Date: 02/10/2021 CLINICAL DATA:  Nasogastric tube placement. EXAM: PORTABLE CHEST 1 VIEW COMPARISON:  Radiographs 02/07/2021 and 11/07/2020. FINDINGS: 1233 hours. Enteric tube projects over the left upper quadrant of the stomach, consistent with location in the proximal stomach. The stomach is mildly distended with air. No evidence of bowel wall thickening or pneumoperitoneum. The heart is mildly enlarged. Telemetry leads overlie the chest and upper abdomen. IMPRESSION: Enteric tube overlies the proximal stomach. Electronically Signed   By: Carey Bullocks M.D.   On: 02/10/2021 15:00        Scheduled Meds:  vitamin C  500 mg Oral Daily   chlorhexidine  15 mL Mouth Rinse BID   Chlorhexidine Gluconate Cloth  6 each Topical Daily   free water  100 mL Per Tube Q4H   heparin  5,000 Units Subcutaneous Q8H   insulin aspart  0-15 Units Subcutaneous Q4H   insulin glargine-yfgn  8 Units Subcutaneous Daily   mouth rinse  15 mL Mouth Rinse q12n4p   Continuous Infusions:  cefTRIAXone (ROCEPHIN)  IV Stopped (02/10/21 1552)   feeding supplement (OSMOLITE 1.2 CAL) 45 mL/hr at 02/11/21 0420   potassium PHOSPHATE IVPB (in mmol) 30 mmol (02/11/21 1120)   remdesivir 100 mg in NS 100 mL 100 mg (02/11/21 0931)     LOS: 4 days    Time spent: 25 minutes    Alberteen Sam, MD [facility] 02/11/2021, 12:13 PM     Please page though AMION or Epic secure chat:  For [employer], Higher education careers adviser

## 2021-02-12 DIAGNOSIS — A419 Sepsis, unspecified organism: Secondary | ICD-10-CM | POA: Diagnosis not present

## 2021-02-12 DIAGNOSIS — R6520 Severe sepsis without septic shock: Secondary | ICD-10-CM | POA: Diagnosis not present

## 2021-02-12 DIAGNOSIS — N179 Acute kidney failure, unspecified: Secondary | ICD-10-CM | POA: Diagnosis not present

## 2021-02-12 LAB — COMPREHENSIVE METABOLIC PANEL
ALT: 50 U/L — ABNORMAL HIGH (ref 0–44)
AST: 65 U/L — ABNORMAL HIGH (ref 15–41)
Albumin: 2.2 g/dL — ABNORMAL LOW (ref 3.5–5.0)
Alkaline Phosphatase: 53 U/L (ref 38–126)
Anion gap: 5 (ref 5–15)
BUN: 16 mg/dL (ref 8–23)
CO2: 29 mmol/L (ref 22–32)
Calcium: 8.2 mg/dL — ABNORMAL LOW (ref 8.9–10.3)
Chloride: 108 mmol/L (ref 98–111)
Creatinine, Ser: 0.68 mg/dL (ref 0.44–1.00)
GFR, Estimated: >60 mL/min (ref >60)
Glucose, Bld: 235 mg/dL — ABNORMAL HIGH (ref 70–99)
Potassium: 4.1 mmol/L (ref 3.5–5.1)
Sodium: 142 mmol/L (ref 135–145)
Total Bilirubin: 0.3 mg/dL (ref 0.3–1.2)
Total Protein: 5.8 g/dL — ABNORMAL LOW (ref 6.5–8.1)

## 2021-02-12 LAB — GLUCOSE, CAPILLARY
Glucose-Capillary: 105 mg/dL — ABNORMAL HIGH (ref 70–99)
Glucose-Capillary: 155 mg/dL — ABNORMAL HIGH (ref 70–99)
Glucose-Capillary: 197 mg/dL — ABNORMAL HIGH (ref 70–99)
Glucose-Capillary: 213 mg/dL — ABNORMAL HIGH (ref 70–99)
Glucose-Capillary: 259 mg/dL — ABNORMAL HIGH (ref 70–99)
Glucose-Capillary: 260 mg/dL — ABNORMAL HIGH (ref 70–99)

## 2021-02-12 LAB — PHOSPHORUS: Phosphorus: 2.7 mg/dL (ref 2.5–4.6)

## 2021-02-12 LAB — MAGNESIUM: Magnesium: 2 mg/dL (ref 1.7–2.4)

## 2021-02-12 MED ORDER — INSULIN GLARGINE-YFGN 100 UNIT/ML ~~LOC~~ SOLN
14.0000 [IU] | Freq: Every day | SUBCUTANEOUS | Status: DC
  Administered 2021-02-13 – 2021-02-23 (×11): 14 [IU] via SUBCUTANEOUS
  Filled 2021-02-12 (×11): qty 0.14

## 2021-02-12 NOTE — Progress Notes (Signed)
[facility] PROGRESS NOTE    Erica Mueller  YVO:[id number] DOB: 1950-11-17 DOA: 02/07/2021 PCP: [name]      Brief Narrative:  Mrs. Weiss is a 70 y.o. F with CAD, DM, HTN, dementia, and schizophrenia, lives in [place] since May 2022 UTI sepsis who presented with acute somnolence for 1 day.  At baseline, patient able to ambluate and answer questions with one word answers but required assistance to bathe, dress, toilet and not able to engage in conversation.  Per family, she tested positive for COVID "2 weeks ago" and has steadily declined since then.  On the day of admission, she was found at her facility to be somnolent, sent to ER.  In the ER, tachycardic to 120s, tachypneic to 40s, hypotensive initially, GCS 5.  Na 166. Glu 260, Cr 1.4 up from baseline 0.5, Lactate 2.3.  UA suggestive of UTI, CXR clear. Started on fluids, antibiotics and admitted to ICU.      8/30: Admitted to ICU, nephrology consulted 8/31: Transferred to stepdown  9/2: NG tube placed         Assessment & Plan:  Severe sepsis likely due to aspiration pneumonia Presented with tachycardia, tachypnea, encephalopathy, cardiomyopathy and AKI.  Initially thought to be UTI, but culture negative, further chest imaging showed consolidation in RUL/RLL consistent with aspiration.    - Continue Rocephin, day 6 of 7    Hypernatremia Na 166 on admission, now resolved. - Continue free water - Trend BMP  Hypokalemia Hypophosphatemia -Repleted  Transaminitis Mild,  - Monitor LFTs  Resolved COVID >14 days out from infection.  Low utility for remdesivir at this point.  No hypoxia to indicate steroids.    Acute metabolic encephalopathy At baseline, the patietn lived with family until May, was semi-independent, had noted memory loss, but nothing significant per son.  After may admision, she has been more cognitively impaired.    Her encephalopathy here is still dense, she is nonverbal,  does not follow commands.  Probably multifactorial due to sepsis, hypernatremia, COVID, dementia. No significant change  - Hold psychoactive meds amitriptyline, thorazine, thiothixine and Invega    Elevated troponin Cardiomyopathy Echo 8/30 showed reduced EF, new finding, without symptoms of CHF.  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  Pending goals of care, will consider further work up with Cardiology. At the moment, I do not believe she is a candidate for LHC. - Monitor clinically  Dysphagia Swallowing dysfunction is mostly cognitive - NPO  - Continue temporary NG - Will meet with family this weekend, for goals of care - Consult SLP   Acute respiratory failure ruled out  Diabetes Sugars mildly high - Conitnue correction insulin - Continue lantus, increase dose - Hold metformin and sulfonylurea  Coronary disease Hypertension BP stable, normal - Hold lovastatin, metoprolol until clinical status improves  Schizophrenia - Hold amitriptyline, thorazine, thiothixine and Invega until mentation better  Possible dementia       Acute kidney injury Baseline Cr 0.6, here was more than doubled on admission >1.4 an dimproved to normal over 3 days with fluids  Stage II pressure injury sacrum, POA  Thrombocytopenia due to sepsis  Severe protein calorie malnutrition As evidenced by sevrely reduced muscle mass and fat, diffusely              Disposition: Status is: Inpatient  Remains inpatient appropriate because:Altered mental status and IV treatments appropriate due to intensity of illness or inability to take PO  Dispo: The  patient is from: SNF              Anticipated d/c is to: SNF              Patient currently is not medically stable to d/c.   Difficult to place patient No   Level of care: Med-Surg  Patient presented with acute metabolic encephalopathy due to hypernatremia, renal insufficiency, and likely sepsis.  Sodium has been  corrected, her sepsis has been stabilized, but she is still densely encephalopathic.  I suspect this is permanent and terminal, and we will continue supportive cares and facilitate goals fo care discussions with family       MDM: The below labs and imaging reports were reviewed and summarized above.  Medication management as above.    DVT prophylaxis: heparin injection 5,000 Units Start: 02/07/21 2200 SCDs Start: 02/07/21 1805  Code Status: FULL Family Communication:      Consultants:  [facility] Nephrology  Procedures:  8/30 CTA chest -- no PE, bilateral infiltrates 8/30 Doppler US -- negative for DVT 8/30: echo: reduced EF, new finding  Antimicrobials:  Vancomycin Flagyl x1 on 8/30 Cefepime 8/30 >> 9/1 Rocephin 9/1>>  Culture data:   8/30 urine culture no growth 8/30 BCx ngtd          Subjective: Nursing report no fever, respiratory distress, agitation, seizures.  She is nonverbal.        Objective: Vitals:   02/12/21 0024 02/12/21 0437 02/12/21 0753 02/12/21 1154  BP: (!) 170/54 (!) 114/52 (!) 120/54 (!) 132/48  Pulse: (!) 109 83 87 86  Resp: 14 14 16 16   Temp: 97.8 F (36.6 C) 97.7 F (36.5 C) 97.8 F (36.6 C) 97.9 F (36.6 C)  TempSrc:      SpO2:  96% 99% 98%  Weight:      Height:        Intake/Output Summary (Last 24 hours) at 02/12/2021 1216 Last data filed at 02/12/2021 0053 Gross per 24 hour  Intake 1617.83 ml  Output 250 ml  Net 1367.83 ml   Filed Weights   02/07/21 1842 02/10/21 0500 02/11/21 0500  Weight: 50 kg 49.4 kg 51.4 kg    Examination: General appearance: Adult female, lying in bed, no acute distress, not very responsive     HEENT: NG tube in place Skin: No suspicious rashes or lesions Cardiac: RRR, no murmurs, no lower extremity edema Respiratory: Normal respiratory rate and rhythm, lungs clear without rales or wheezes Abdomen: No grimace to palpation, no ascites or distention MSK: Diffuse severely reduced muscle mass and  fat Neuro: Makes eye contact, but does not track me does move around the room.  Does not follow commands, no spontaneous verbalizations or spontaneous movements today, face symmetric, she is chewing. Psych: No intelligible responses     Data Reviewed: I have personally reviewed following labs and imaging studies:  CBC: Recent Labs  Lab 02/07/21 1516 02/08/21 0405 02/09/21 0114 02/10/21 0930  WBC 17.2* 13.8* 9.1 6.8  NEUTROABS 15.3*  --   --   --   HGB 15.1* 12.9 11.7* 12.4  HCT 50.5* 43.9 38.3 39.5  MCV 100.4* 98.9 99.0 96.1  PLT 159 118* 111* 112*   Basic Metabolic Panel: Recent Labs  Lab 02/08/21 0405 02/08/21 0918 02/09/21 0114 02/09/21 1242 02/09/21 2114 02/10/21 0930 02/11/21 0601 02/12/21 0631  NA 162*   < > 159* 151* 149* 144 144 142  K 3.7  --  2.8*  --   --  3.5 3.1* 4.1  CL 128*  --  125*  --   --  111 108 108  CO2 29  --  28  --   --  28 29 29   GLUCOSE 70  --  89  --   --  188* 221* 235*  BUN 48*  --  26*  --   --  18 14 16   CREATININE 0.85  --  0.75  --   --  0.59 0.68 0.68  CALCIUM 9.2  --  8.7*  --   --  7.8* 8.1* 8.2*  MG 2.3  --  2.2  --   --  2.1 2.0 2.0  PHOS 1.6*  --  1.7*  --   --  2.5 2.3* 2.7   < > = values in this interval not displayed.   GFR: Estimated Creatinine Clearance: 51.8 mL/min (by C-G formula based on SCr of 0.68 mg/dL). Liver Function Tests: Recent Labs  Lab 02/08/21 0850 02/09/21 0114 02/10/21 0930 02/11/21 0601 02/12/21 0631  AST 20 33 38 50* 65*  ALT 11 13 19 26  50*  ALKPHOS 46 45 50 48 53  BILITOT 0.6 0.5 0.6 0.5 0.3  PROT 6.5 6.3* 6.1* 5.7* 5.8*  ALBUMIN 2.7* 2.4* 2.3* 2.1* 2.2*   No results for input(s): LIPASE, AMYLASE in the last 168 hours. No results for input(s): AMMONIA in the last 168 hours. Coagulation Profile: Recent Labs  Lab 02/07/21 1516  INR 1.5*   Cardiac Enzymes: No results for input(s): CKTOTAL, CKMB, CKMBINDEX, TROPONINI in the last 168 hours. BNP (last 3 results) No results for input(s):  PROBNP in the last 8760 hours. HbA1C: No results for input(s): HGBA1C in the last 72 hours.  CBG: Recent Labs  Lab 02/11/21 1946 02/12/21 0017 02/12/21 0432 02/12/21 0751 02/12/21 1153  GLUCAP 147* 155* 197* 213* 260*   Lipid Profile: No results for input(s): CHOL, HDL, LDLCALC, TRIG, CHOLHDL, LDLDIRECT in the last 72 hours. Thyroid Function Tests: No results for input(s): TSH, T4TOTAL, FREET4, T3FREE, THYROIDAB in the last 72 hours.  Anemia Panel: No results for input(s): VITAMINB12, FOLATE, FERRITIN, TIBC, IRON, RETICCTPCT in the last 72 hours. Urine analysis:    Component Value Date/Time   COLORURINE YELLOW (A) 02/07/2021 1650   APPEARANCEUR CLOUDY (A) 02/07/2021 1650   LABSPEC 1.026 02/07/2021 1650   PHURINE 5.0 02/07/2021 1650   GLUCOSEU 50 (A) 02/07/2021 1650   HGBUR MODERATE (A) 02/07/2021 1650   BILIRUBINUR NEGATIVE 02/07/2021 1650   KETONESUR 5 (A) 02/07/2021 1650   PROTEINUR 30 (A) 02/07/2021 1650   NITRITE NEGATIVE 02/07/2021 1650   LEUKOCYTESUR MODERATE (A) 02/07/2021 1650   Sepsis Labs: @LABRCNTIP (procalcitonin:4,lacticacidven:4)  ) Recent Results (from the past 240 hour(s))  Resp Panel by RT-PCR (Flu A&B, Covid) Nasopharyngeal Swab     Status: Abnormal   Collection Time: 02/07/21  3:16 PM   Specimen: Nasopharyngeal Swab; Nasopharyngeal(NP) swabs in vial transport medium  Result Value Ref Range Status   SARS Coronavirus 2 by RT PCR POSITIVE (A) NEGATIVE Final    Comment: RESULT CALLED TO, READ BACK BY AND VERIFIED WITH: MATT BASSETT 02/07/21 1644 AMK (NOTE) SARS-CoV-2 target nucleic acids are DETECTED.  The SARS-CoV-2 RNA is generally detectable in upper respiratory specimens during the acute phase of infection. Positive results are indicative of the presence of the identified virus, but do not rule out bacterial infection or co-infection with other pathogens not detected by the test. Clinical correlation with patient history and other diagnostic  information  is necessary to determine patient infection status. The expected result is Negative.  Fact Sheet for Patients: BloggerCourse.com  Fact Sheet for Healthcare Providers: SeriousBroker.it  This test is not yet approved or cleared by the [place] FDA and  has been authorized for detection and/or diagnosis of SARS-CoV-2 by FDA under an Emergency Use Authorization (EUA).  This EUA will remain in effect (meaning this test can be use d) for the duration of  the COVID-19 declaration under Section 564(b)(1) of the Act, 21 U.S.C. section 360bbb-3(b)(1), unless the authorization is terminated or revoked sooner.     Influenza A by PCR NEGATIVE NEGATIVE Final   Influenza B by PCR NEGATIVE NEGATIVE Final    Comment: (NOTE) The Xpert Xpress SARS-CoV-2/FLU/RSV plus assay is intended as an aid in the diagnosis of influenza from Nasopharyngeal swab specimens and should not be used as a sole basis for treatment. Nasal washings and aspirates are unacceptable for Xpert Xpress SARS-CoV-2/FLU/RSV testing.  Fact Sheet for Patients: BloggerCourse.com  Fact Sheet for Healthcare Providers: SeriousBroker.it  This test is not yet approved or cleared by the [place] FDA and has been authorized for detection and/or diagnosis of SARS-CoV-2 by FDA under an Emergency Use Authorization (EUA). This EUA will remain in effect (meaning this test can be used) for the duration of the COVID-19 declaration under Section 564(b)(1) of the Act, 21 U.S.C. section 360bbb-3(b)(1), unless the authorization is terminated or revoked.  Performed at [facility], [address] [place], [place] [address]   Blood culture (routine single)     Status: None (Preliminary result)   Collection Time: 02/07/21  3:37 PM   Specimen: BLOOD  Result Value Ref Range Status   Specimen Description BLOOD RIGHT  ANTECUBITAL  Final   Special Requests   Final    BOTTLES DRAWN AEROBIC AND ANAEROBIC Blood Culture adequate volume   Culture   Final    NO GROWTH 4 DAYS Performed at [facility], [address], [place], [place] [address]    Report Status PENDING  Incomplete  Culture, blood (single)     Status: None (Preliminary result)   Collection Time: 02/07/21  3:50 PM   Specimen: BLOOD  Result Value Ref Range Status   Specimen Description BLOOD LEFT ASSIST CONTROL  Final   Special Requests   Final    BOTTLES DRAWN AEROBIC AND ANAEROBIC Blood Culture adequate volume   Culture   Final    NO GROWTH 4 DAYS Performed at [facility], [address] [place], [place] [address]    Report Status PENDING  Incomplete  Urine Culture     Status: None   Collection Time: 02/07/21  4:50 PM   Specimen: In/Out Cath Urine  Result Value Ref Range Status   Specimen Description   Final    IN/OUT CATH URINE Performed at [facility], [address], [place], [place] [address]    Special Requests   Final    NONE Performed at [facility], [address] [place], [place] [address]    Culture   Final    NO GROWTH Performed at [facility], [address] [place], [place] [address]    Report Status 02/09/2021 FINAL  Final  MRSA Next Gen by PCR, Nasal     Status: None   Collection Time: 02/09/21  9:47 AM   Specimen: Nasal Mucosa; Nasal Swab  Result Value Ref Range Status   MRSA by PCR Next Gen NOT DETECTED  NOT DETECTED Final    Comment: (NOTE) The GeneXpert MRSA Assay (FDA approved for NASAL specimens only), is one component of a comprehensive MRSA colonization surveillance program. It is not intended to diagnose MRSA infection nor to guide or monitor treatment for MRSA infections. Test performance is not FDA approved in patients less than 69 years old. Performed at [facility], [address], [place], [place] [address]          Radiology  Studies: MR BRAIN WO CONTRAST  Result Date: 02/12/2021 CLINICAL DATA:  Encephalopathy EXAM: MRI HEAD WITHOUT CONTRAST TECHNIQUE: Multiplanar, multiecho pulse sequences of the brain and surrounding structures were obtained without intravenous contrast. COMPARISON:  None. FINDINGS: Brain: No acute infarct, mass effect or extra-axial collection. No acute or chronic hemorrhage. There is multifocal hyperintense T2-weighted signal within the white matter. Generalized volume loss without a clear lobar predilection. 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 A partially empty sella is incidentally noted. Vascular: Major flow voids are preserved. Skull and upper cervical spine: Normal calvarium and skull base. Visualized upper cervical spine and soft tissues are normal. Sinuses/Orbits:Small mastoid effusions and mild right maxillary sinus mucosal thickening. Normal orbits. IMPRESSION: 1. No acute intracranial abnormality. 2. Generalized volume loss and findings of chronic ischemic microangiopathy. 3. 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 Electronically Signed   By: Deatra Robinson M.D.   On: 02/12/2021 00:04   DG Chest Port 1 View  Result Date: 02/10/2021 CLINICAL DATA:  Nasogastric tube placement. EXAM: PORTABLE CHEST 1 VIEW COMPARISON:  Radiographs 02/07/2021 and 11/07/2020. FINDINGS: 1233 hours. Enteric tube projects over the left upper quadrant of the stomach, consistent with location in the proximal stomach. The stomach is mildly distended with air. No evidence of bowel wall thickening or pneumoperitoneum. The heart is mildly enlarged. Telemetry leads overlie the chest and upper abdomen. IMPRESSION: Enteric tube overlies the proximal stomach. Electronically Signed   By: Carey Bullocks M.D.   On: 02/10/2021 15:00        Scheduled Meds:  vitamin C  500 mg Oral Daily   chlorhexidine  15 mL Mouth Rinse BID   Chlorhexidine Gluconate Cloth  6 each Topical Daily   free water  100 mL  Per Tube Q4H   heparin  5,000 Units Subcutaneous Q8H   insulin aspart  0-15 Units Subcutaneous Q4H   insulin glargine-yfgn  8 Units Subcutaneous Daily   mouth rinse  15 mL Mouth Rinse q12n4p   Continuous Infusions:  cefTRIAXone (ROCEPHIN)  IV Stopped (02/11/21 2019)   feeding supplement (OSMOLITE 1.2 CAL) 55 mL/hr at 02/12/21 0035     LOS: 5 days    Time spent: 25 minutes    Alberteen Sam, MD [facility] 02/12/2021, 12:16 PM     Please page though AMION or Epic secure chat:  For [employer], Higher education careers adviser

## 2021-02-12 NOTE — Plan of Care (Signed)
  Problem: Education: Goal: Knowledge of General Education information will improve Description: Including pain rating scale, medication(s)/side effects and non-pharmacologic comfort measures Outcome: Progressing   Problem: Health Behavior/Discharge Planning: Goal: Ability to manage health-related needs will improve Outcome: Progressing   Problem: Clinical Measurements: Goal: Ability to maintain clinical measurements within normal limits will improve Outcome: Progressing Goal: Will remain free from infection Outcome: Progressing Goal: Diagnostic test results will improve Outcome: Progressing Goal: Respiratory complications will improve Outcome: Progressing Goal: Cardiovascular complication will be avoided Outcome: Progressing   Problem: Activity: Goal: Risk for activity intolerance will decrease Outcome: Progressing   Problem: Nutrition: Goal: Adequate nutrition will be maintained Outcome: Progressing   Problem: Coping: Goal: Level of anxiety will decrease Outcome: Progressing   Problem: Elimination: Goal: Will not experience complications related to bowel motility Outcome: Progressing Goal: Will not experience complications related to urinary retention Outcome: Progressing   Problem: Pain Managment: Goal: General experience of comfort will improve Outcome: Progressing   Problem: Safety: Goal: Ability to remain free from injury will improve Outcome: Progressing   Problem: Skin Integrity: Goal: Risk for impaired skin integrity will decrease Outcome: Progressing   Problem: Respiratory: Goal: Will maintain a patent airway Outcome: Progressing Goal: Complications related to the disease process, condition or treatment will be avoided or minimized Outcome: Progressing   

## 2021-02-13 DIAGNOSIS — Z7189 Other specified counseling: Secondary | ICD-10-CM | POA: Diagnosis not present

## 2021-02-13 DIAGNOSIS — N179 Acute kidney failure, unspecified: Secondary | ICD-10-CM | POA: Diagnosis not present

## 2021-02-13 DIAGNOSIS — A419 Sepsis, unspecified organism: Secondary | ICD-10-CM | POA: Diagnosis not present

## 2021-02-13 DIAGNOSIS — R6520 Severe sepsis without septic shock: Secondary | ICD-10-CM | POA: Diagnosis not present

## 2021-02-13 LAB — BASIC METABOLIC PANEL
Anion gap: 4 — ABNORMAL LOW (ref 5–15)
BUN: 15 mg/dL (ref 8–23)
CO2: 29 mmol/L (ref 22–32)
Calcium: 8.1 mg/dL — ABNORMAL LOW (ref 8.9–10.3)
Chloride: 107 mmol/L (ref 98–111)
Creatinine, Ser: 0.51 mg/dL (ref 0.44–1.00)
GFR, Estimated: >60 mL/min (ref >60)
Glucose, Bld: 138 mg/dL — ABNORMAL HIGH (ref 70–99)
Potassium: 3.9 mmol/L (ref 3.5–5.1)
Sodium: 140 mmol/L (ref 135–145)

## 2021-02-13 LAB — GLUCOSE, CAPILLARY
Glucose-Capillary: 193 mg/dL — ABNORMAL HIGH (ref 70–99)
Glucose-Capillary: 153 mg/dL — ABNORMAL HIGH (ref 70–99)
Glucose-Capillary: 144 mg/dL — ABNORMAL HIGH (ref 70–99)
Glucose-Capillary: 269 mg/dL — ABNORMAL HIGH (ref 70–99)
Glucose-Capillary: 206 mg/dL — ABNORMAL HIGH (ref 70–99)
Glucose-Capillary: 154 mg/dL — ABNORMAL HIGH (ref 70–99)

## 2021-02-13 LAB — CBC
HCT: 33.8 % — ABNORMAL LOW (ref 36.0–46.0)
Hemoglobin: 10.9 g/dL — ABNORMAL LOW (ref 12.0–15.0)
MCH: 30.3 pg (ref 26.0–34.0)
MCHC: 32.2 g/dL (ref 30.0–36.0)
MCV: 93.9 fL (ref 80.0–100.0)
Platelets: 169 10*3/uL (ref 150–400)
RBC: 3.6 MIL/uL — ABNORMAL LOW (ref 3.87–5.11)
RDW: 13.5 % (ref 11.5–15.5)
WBC: 6.5 10*3/uL (ref 4.0–10.5)
nRBC: 0 % (ref 0.0–0.2)

## 2021-02-13 LAB — PHOSPHORUS: Phosphorus: 2.4 mg/dL — ABNORMAL LOW (ref 2.5–4.6)

## 2021-02-13 LAB — MAGNESIUM: Magnesium: 2 mg/dL (ref 1.7–2.4)

## 2021-02-13 MED ORDER — K PHOS MONO-SOD PHOS DI & MONO 155-852-130 MG PO TABS
500.0000 mg | ORAL_TABLET | Freq: Two times a day (BID) | ORAL | Status: AC
  Administered 2021-02-13 (×2): 500 mg
  Filled 2021-02-13 (×2): qty 2

## 2021-02-13 NOTE — Progress Notes (Signed)
[facility] PROGRESS NOTE    LAQUEISHA Mueller  QVZ:[id number] DOB: August 24, 1950 DOA: 02/07/2021 PCP: [name]      Brief Narrative:  Mrs. Haddaway is a 70 y.o. F with CAD, DM, HTN, dementia, and schizophrenia, lives in [place] since May 2022 UTI sepsis who presented with acute somnolence for 1 day.  At baseline, patient able to ambluate and answer questions with one word answers but required assistance to bathe, dress, toilet and not able to engage in conversation.  Per family, she tested positive for COVID "2 weeks ago" and has steadily declined since then.  On the day of admission, she was found at her facility to be somnolent, sent to ER.  In the ER, tachycardic to 120s, tachypneic to 40s, hypotensive initially, GCS 5.  Na 166. Glu 260, Cr 1.4 up from baseline 0.5, Lactate 2.3.  UA suggestive of UTI, CXR clear. Started on fluids, antibiotics and admitted to ICU.      8/30: Admitted to ICU, nephrology consulted 8/31: Transferred to stepdown  9/2: NG tube placed         Assessment & Plan:  Severe sepsis likely due to aspiration pneumonia Presented with tachycardia, tachypnea, encephalopathy, cardiomyopathy and AKI.  Initially thought to be UTI, but culture negative, further chest imaging showed consolidation in RUL/RLL consistent with aspiration.    -Continue Rocephin, day 7 of 7    Hypernatremia Na 166 on admission, now resolved.  Hypokalemia Repleted and resolved  Hypophosphatemia - Supplement phosphate  Transaminitis May be related to COVID, versus congestive hepatopathy, versus resolving ischemic injury due to sepsis - Trend LFTs tomorrow   Resolved COVID Patient diagnosed 8/18, had no symptoms.  Okay to discontinue precautions at this point.   Acute on chronic metabolic encephalopathy At baseline, the patient lived with family until May, was semi-independent, had noted memory loss, but nothing significant per son.  After May admision, she  has been more cognitively impaired, was in short-term rehab for time, but not in the long-term care.    Recent notes from her facility indicate that she has been quite cognitively impaired recently, with limited interaction.  Since admission here, she is nonverbal, does not follow commands.  This is multifactorial due to sepsis, hypernatremia, COVID, dementia. No significant change  - Hold psychoactive meds amitriptyline, thorazine, thiothixine and Invega    Elevated troponin Cardiomyopathy Echo 8/30 showed reduced EF, new finding, without symptoms of CHF.  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  Pending goals of care, will consider further work up with Cardiology. At the moment, I do not believe she is a candidate for LHC. - Monitor clinically  Dysphagia Swallowing dysfunction is mostly cognitive - NPO  - Continue temporary NG - Will meet with family this weekend, for goals of care - Consult SLP   Acute respiratory failure ruled out  Diabetes Glucose normalized - Continue correction insulin - Continue lantus - Hold metformin and sulfonylurea  Coronary disease Hypertension BP controlled - Hold lovastatin, metoprolol until clinical status improves  Schizophrenia - Hold amitriptyline, thorazine, thiothixine and Invega until mentation better  Possible dementia       Acute kidney injury Baseline Cr 0.6, here was more than doubled on admission >1.4 an dimproved to normal over 3 days with fluids  Stage II pressure injury sacrum, POA  Thrombocytopenia due to sepsis  Severe protein calorie malnutrition As evidenced by sevrely reduced muscle mass and fat, diffusely  Disposition: Status is: Inpatient  Remains inpatient appropriate because:Altered mental status and IV treatments appropriate due to intensity of illness or inability to take PO  Dispo: The patient is from: SNF              Anticipated d/c is to: SNF              Patient  currently is not medically stable to d/c.   Difficult to place patient No   Level of care: Med-Surg  Patient presented with acute metabolic encephalopathy due to hypernatremia, renal insufficiency, and likely sepsis.  Sodium has been corrected, her sepsis has been stabilized, but she is still densely encephalopathic.  I suspect this is permanent and terminal, and we will continue supportive cares and facilitate goals fo care discussions with family       MDM: The below labs and imaging reports were reviewed and summarized above.  Medication management as above.    DVT prophylaxis: heparin injection 5,000 Units Start: 02/07/21 2200 SCDs Start: 02/07/21 1805  Code Status: FULL Family Communication:      Consultants:  [facility] Nephrology  Procedures:  8/30 CTA chest -- no PE, bilateral infiltrates 8/30 Doppler US -- negative for DVT 8/30: echo: reduced EF, new finding  Antimicrobials:  Vancomycin Flagyl x1 on 8/30 Cefepime 8/30 >> 9/1 Rocephin 9/1>> 9/5  Culture data:   8/30 urine culture no growth 8/30 BCx ngtd          Subjective: No new fever, agitation, seizures, respiratory distress, obvious pain.        Objective: Vitals:   02/12/21 1154 02/12/21 1535 02/12/21 2234 02/13/21 0820  BP: (!) 132/48 (!) 125/48 132/69 134/63  Pulse: 86 95 96 90  Resp: 16 16 16 17   Temp: 97.9 F (36.6 C) 98.1 F (36.7 C) 97.8 F (36.6 C) 98.6 F (37 C)  TempSrc:      SpO2: 98% 99% 99%   Weight:      Height:        Intake/Output Summary (Last 24 hours) at 02/13/2021 0946 Last data filed at 02/13/2021 0115 Gross per 24 hour  Intake 969 ml  Output 950 ml  Net 19 ml   Filed Weights   02/07/21 1842 02/10/21 0500 02/11/21 0500  Weight: 50 kg 49.4 kg 51.4 kg    Examination: General appearance: Adult female, lying in bed, no acute distress     HEENT: NG tube in place, conjunctive a normal, lids and lashes normal, anicteric, no nasal forming, discharge, or epistaxis,  mouth open, does not open mouth for examination Skin: No suspicious rashes or lesions Cardiac:, No murmurs, no lower extremity edema Respiratory: Normal respiratory rate and rhythm, lungs clear without rales or wheezes Abdomen: Abdomen soft, no grimace to palpation, no guarding, no ascites or distention MSK: Reduced muscle mass and fat Neuro: Seems to resist in all 4 extremities, but otherwise does not follow commands.  Does make eye contact, but not consistently, no spontaneous verbalizations or spontaneous movements. Psych: Nonverbal     Data Reviewed: I have personally reviewed following labs and imaging studies:  CBC: Recent Labs  Lab 02/07/21 1516 02/08/21 0405 02/09/21 0114 02/10/21 0930 02/13/21 0629  WBC 17.2* 13.8* 9.1 6.8 6.5  NEUTROABS 15.3*  --   --   --   --   HGB 15.1* 12.9 11.7* 12.4 10.9*  HCT 50.5* 43.9 38.3 39.5 33.8*  MCV 100.4* 98.9 99.0 96.1 93.9  PLT 159 118* 111* 112* 169  Basic Metabolic Panel: Recent Labs  Lab 02/09/21 0114 02/09/21 1242 02/09/21 2114 02/10/21 0930 02/11/21 0601 02/12/21 0631 02/13/21 0629  NA 159*   < > 149* 144 144 142 140  K 2.8*  --   --  3.5 3.1* 4.1 3.9  CL 125*  --   --  111 108 108 107  CO2 28  --   --  28 29 29 29   GLUCOSE 89  --   --  188* 221* 235* 138*  BUN 26*  --   --  18 14 16 15   CREATININE 0.75  --   --  0.59 0.68 0.68 0.51  CALCIUM 8.7*  --   --  7.8* 8.1* 8.2* 8.1*  MG 2.2  --   --  2.1 2.0 2.0 2.0  PHOS 1.7*  --   --  2.5 2.3* 2.7 2.4*   < > = values in this interval not displayed.   GFR: Estimated Creatinine Clearance: 51.8 mL/min (by C-G formula based on SCr of 0.51 mg/dL). Liver Function Tests: Recent Labs  Lab 02/08/21 0850 02/09/21 0114 02/10/21 0930 02/11/21 0601 02/12/21 0631  AST 20 33 38 50* 65*  ALT 11 13 19 26  50*  ALKPHOS 46 45 50 48 53  BILITOT 0.6 0.5 0.6 0.5 0.3  PROT 6.5 6.3* 6.1* 5.7* 5.8*  ALBUMIN 2.7* 2.4* 2.3* 2.1* 2.2*   No results for input(s): LIPASE, AMYLASE in the  last 168 hours. No results for input(s): AMMONIA in the last 168 hours. Coagulation Profile: Recent Labs  Lab 02/07/21 1516  INR 1.5*   Cardiac Enzymes: No results for input(s): CKTOTAL, CKMB, CKMBINDEX, TROPONINI in the last 168 hours. BNP (last 3 results) No results for input(s): PROBNP in the last 8760 hours. HbA1C: No results for input(s): HGBA1C in the last 72 hours.  CBG: Recent Labs  Lab 02/12/21 1539 02/12/21 2230 02/13/21 0104 02/13/21 0426 02/13/21 0745  GLUCAP 105* 259* 193* 153* 144*   Lipid Profile: No results for input(s): CHOL, HDL, LDLCALC, TRIG, CHOLHDL, LDLDIRECT in the last 72 hours. Thyroid Function Tests: No results for input(s): TSH, T4TOTAL, FREET4, T3FREE, THYROIDAB in the last 72 hours.  Anemia Panel: No results for input(s): VITAMINB12, FOLATE, FERRITIN, TIBC, IRON, RETICCTPCT in the last 72 hours. Urine analysis:    Component Value Date/Time   COLORURINE YELLOW (A) 02/07/2021 1650   APPEARANCEUR CLOUDY (A) 02/07/2021 1650   LABSPEC 1.026 02/07/2021 1650   PHURINE 5.0 02/07/2021 1650   GLUCOSEU 50 (A) 02/07/2021 1650   HGBUR MODERATE (A) 02/07/2021 1650   BILIRUBINUR NEGATIVE 02/07/2021 1650   KETONESUR 5 (A) 02/07/2021 1650   PROTEINUR 30 (A) 02/07/2021 1650   NITRITE NEGATIVE 02/07/2021 1650   LEUKOCYTESUR MODERATE (A) 02/07/2021 1650   Sepsis Labs: @LABRCNTIP (procalcitonin:4,lacticacidven:4)  ) Recent Results (from the past 240 hour(s))  Resp Panel by RT-PCR (Flu A&B, Covid) Nasopharyngeal Swab     Status: Abnormal   Collection Time: 02/07/21  3:16 PM   Specimen: Nasopharyngeal Swab; Nasopharyngeal(NP) swabs in vial transport medium  Result Value Ref Range Status   SARS Coronavirus 2 by RT PCR POSITIVE (A) NEGATIVE Final    Comment: RESULT CALLED TO, READ BACK BY AND VERIFIED WITH: MATT BASSETT 02/07/21 1644 AMK (NOTE) SARS-CoV-2 target nucleic acids are DETECTED.  The SARS-CoV-2 RNA is generally detectable in upper  respiratory specimens during the acute phase of infection. Positive results are indicative of the presence of the identified virus, but do not rule out bacterial infection  or co-infection with other pathogens not detected by the test. Clinical correlation with patient history and other diagnostic information is necessary to determine patient infection status. The expected result is Negative.  Fact Sheet for Patients: BloggerCourse.comhttps://www.fda.gov/media/152166/download  Fact Sheet for Healthcare Providers: SeriousBroker.ithttps://www.fda.gov/media/152162/download  This test is not yet approved or cleared by the [place] FDA and  has been authorized for detection and/or diagnosis of SARS-CoV-2 by FDA under an Emergency Use Authorization (EUA).  This EUA will remain in effect (meaning this test can be use d) for the duration of  the COVID-19 declaration under Section 564(b)(1) of the Act, 21 U.S.C. section 360bbb-3(b)(1), unless the authorization is terminated or revoked sooner.     Influenza A by PCR NEGATIVE NEGATIVE Final   Influenza B by PCR NEGATIVE NEGATIVE Final    Comment: (NOTE) The Xpert Xpress SARS-CoV-2/FLU/RSV plus assay is intended as an aid in the diagnosis of influenza from Nasopharyngeal swab specimens and should not be used as a sole basis for treatment. Nasal washings and aspirates are unacceptable for Xpert Xpress SARS-CoV-2/FLU/RSV testing.  Fact Sheet for Patients: BloggerCourse.comhttps://www.fda.gov/media/152166/download  Fact Sheet for Healthcare Providers: SeriousBroker.ithttps://www.fda.gov/media/152162/download  This test is not yet approved or cleared by the [place] FDA and has been authorized for detection and/or diagnosis of SARS-CoV-2 by FDA under an Emergency Use Authorization (EUA). This EUA will remain in effect (meaning this test can be used) for the duration of the COVID-19 declaration under Section 564(b)(1) of the Act, 21 U.S.C. section 360bbb-3(b)(1), unless the authorization is  terminated or revoked.  Performed at [facility], [address] [place], [place] [address]   Blood culture (routine single)     Status: None (Preliminary result)   Collection Time: 02/07/21  3:37 PM   Specimen: BLOOD  Result Value Ref Range Status   Specimen Description BLOOD RIGHT ANTECUBITAL  Final   Special Requests   Final    BOTTLES DRAWN AEROBIC AND ANAEROBIC Blood Culture adequate volume   Culture   Final    NO GROWTH 4 DAYS Performed at [facility], [address] [place], [place] [address]    Report Status PENDING  Incomplete  Culture, blood (single)     Status: None (Preliminary result)   Collection Time: 02/07/21  3:50 PM   Specimen: BLOOD  Result Value Ref Range Status   Specimen Description BLOOD LEFT ASSIST CONTROL  Final   Special Requests   Final    BOTTLES DRAWN AEROBIC AND ANAEROBIC Blood Culture adequate volume   Culture   Final    NO GROWTH 4 DAYS Performed at [facility], [address] [place], [place] [address]    Report Status PENDING  Incomplete  Urine Culture     Status: None   Collection Time: 02/07/21  4:50 PM   Specimen: In/Out Cath Urine  Result Value Ref Range Status   Specimen Description   Final    IN/OUT CATH URINE Performed at [facility], [address] [place], [place] [address]    Special Requests   Final    NONE Performed at [facility], [address] [place], [place] [address]    Culture   Final    NO GROWTH Performed at [facility], [address] [place], [place] [address]    Report Status 02/09/2021 FINAL  Final  MRSA Next Gen by PCR, Nasal     Status: None   Collection Time: 02/09/21  9:47 AM   Specimen:  Nasal Mucosa; Nasal Swab  Result Value Ref Range Status   MRSA by PCR Next Gen NOT DETECTED NOT DETECTED Final    Comment: (NOTE) The GeneXpert MRSA Assay (FDA approved for NASAL specimens only), is one component of a comprehensive MRSA colonization  surveillance program. It is not intended to diagnose MRSA infection nor to guide or monitor treatment for MRSA infections. Test performance is not FDA approved in patients less than 65 years old. Performed at [facility], [address] [place], [place] [address]          Radiology Studies: MR BRAIN WO CONTRAST  Result Date: 02/12/2021 CLINICAL DATA:  Encephalopathy EXAM: MRI HEAD WITHOUT CONTRAST TECHNIQUE: Multiplanar, multiecho pulse sequences of the brain and surrounding structures were obtained without intravenous contrast. COMPARISON:  None. FINDINGS: Brain: No acute infarct, mass effect or extra-axial collection. No acute or chronic hemorrhage. There is multifocal hyperintense T2-weighted signal within the white matter. Generalized volume loss without a clear lobar predilection. 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 A partially empty sella is incidentally noted. Vascular: Major flow voids are preserved. Skull and upper cervical spine: Normal calvarium and skull base. Visualized upper cervical spine and soft tissues are normal. Sinuses/Orbits:Small mastoid effusions and mild right maxillary sinus mucosal thickening. Normal orbits. IMPRESSION: 1. No acute intracranial abnormality. 2. Generalized volume loss and findings of chronic ischemic microangiopathy. 3. 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 Electronically Signed   By: Deatra Robinson M.D.   On: 02/12/2021 00:04        Scheduled Meds:  vitamin C  500 mg Oral Daily   chlorhexidine  15 mL Mouth Rinse BID   Chlorhexidine Gluconate Cloth  6 each Topical Daily   free water  100 mL Per Tube Q4H   heparin  5,000 Units Subcutaneous Q8H   insulin aspart  0-15 Units Subcutaneous Q4H   insulin glargine-yfgn  14 Units Subcutaneous Daily   mouth rinse  15 mL Mouth Rinse q12n4p   phosphorus  500 mg Per Tube BID   Continuous Infusions:  cefTRIAXone (ROCEPHIN)  IV 2 g (02/12/21 2255)   feeding  supplement (OSMOLITE 1.2 CAL) 1,000 mL (02/12/21 2320)     LOS: 6 days    Time spent: 25 minutes    Alberteen Sam, MD [facility] 02/13/2021, 9:46 AM     Please page though AMION or Epic secure chat:  For [employer], Higher education careers adviser

## 2021-02-13 NOTE — Consult Note (Addendum)
Consultation Note Date: 02/13/2021   Patient Name: Erica Mueller  DOB: Nov 02, 1950  MRN: [id number]  Age / Sex: 70 y.o., female  PCP: [facility] Referring Physician: Alberteen Sam, *  Reason for Consultation: Establishing goals of care  HPI/Patient Profile: Erica Mueller is a 71 year old female with a past medical history significant for hypertension, diabetes mellitus, schizophrenia, and depression who presented to [facility] ED on 02/07/2021 from [facility] due to altered mental status.  Clinical Assessment and Goals of Care: Patient is resting in bed. She does not open her eyes to my voice. Breathing is even and unlabored. No family at bedside.   Called to speak with son Barrett Shell. He states he was not able to come this weekend, and will not be able to come at this time as he is currently working through custody proceedings with his ex-wife for his son. He discusses balancing the generational issue of caring for children and parents.   Patient has 2 children. He discusses in detail family dynamics and concerns. He states she has always valued her family above all else and would do anything she could for them. He states after she retired, she raised her daughter's children. He states she has lived in a SNF since May, and one of the reasons is because family members wanted to be sure nobody took advantage of her cognitive changes due to dementia. He states they want what is best for her.  We discussed her diagnoses, prognosis, GOC, EOL wishes disposition and options.  Created space and opportunity for patient  to explore thoughts and feelings regarding current medical information.   A detailed discussion was had today regarding advanced directives.  Concepts specific to code status, artifical feeding and hydration, IV antibiotics and rehospitalization were discussed.  The difference between an  aggressive medical intervention path and a comfort care path was discussed.  Values and goals of care important to patient and family were attempted to be elicited.  Educated on the limitations of medical interventions to prolong quality of life in some situations and discussed the concept of human mortality.  He states his mother has helped him all his life and he will not give up on her. Discussed what giving up means to him. Discussed determining what her wishes would be and what choices would bring dignity to her. He states he will consider this. Discussed that his sister will need to be called to discuss GOC as well. Will follow to see how she does with swallowing and oral intake.       SUMMARY OF RECOMMENDATIONS   Continue current care.  Will follow along to address GOC.   Prognosis:  Poor       Primary Diagnoses: Present on Admission:  Sepsis (HCC)   I have reviewed the medical record, interviewed the patient and family, and examined the patient. The following aspects are pertinent.  Past Medical History:  Diagnosis Date   Coronary artery disease    Depression    Diabetes mellitus without complication (  HCC)    Elevated lipids    GERD (gastroesophageal reflux disease)    Hypertension    Schizophrenia (HCC)    Syphilis (acquired)    Social History   Socioeconomic History   Marital status: Divorced    Spouse name: Not on file   Number of children: Not on file   Years of education: Not on file   Highest education level: Not on file  Occupational History   Not on file  Tobacco Use   Smoking status: Every Day    Packs/day: 1.00    Years: 48.00    Pack years: 48.00    Types: Cigarettes    Start date: 12/20/1969   Smokeless tobacco: Never  Vaping Use   Vaping Use: Never used  Substance and Sexual Activity   Alcohol use: Yes    Alcohol/week: 3.0 standard drinks    Types: 2 Cans of beer, 1 Shots of liquor per week   Drug use: Not Currently    Types: Marijuana     Comment: PAST    Sexual activity: Not Currently    Birth control/protection: None  Other Topics Concern   Not on file  Social History Narrative   Not on file   Social Determinants of Health   Financial Resource Strain: Not on file  Food Insecurity: Not on file  Transportation Needs: Not on file  Physical Activity: Not on file  Stress: Not on file  Social Connections: Not on file   Family History  Problem Relation Age of Onset   Cancer Mother    Alcoholism Mother    Bone cancer Father    Heart disease Father    Diabetes Maternal Grandmother    Suicidality Paternal Uncle    Breast cancer Neg Hx    Scheduled Meds:  vitamin C  500 mg Oral Daily   chlorhexidine  15 mL Mouth Rinse BID   Chlorhexidine Gluconate Cloth  6 each Topical Daily   free water  100 mL Per Tube Q4H   heparin  5,000 Units Subcutaneous Q8H   insulin aspart  0-15 Units Subcutaneous Q4H   insulin glargine-yfgn  14 Units Subcutaneous Daily   mouth rinse  15 mL Mouth Rinse q12n4p   phosphorus  500 mg Per Tube BID   Continuous Infusions:  cefTRIAXone (ROCEPHIN)  IV 2 g (02/12/21 2255)   feeding supplement (OSMOLITE 1.2 CAL) 1,000 mL (02/12/21 2320)   PRN Meds:.acetaminophen, acetaminophen, docusate sodium, morphine injection, ondansetron (ZOFRAN) IV, polyethylene glycol Medications Prior to Admission:  Prior to Admission medications   Medication Sig Start Date End Date Taking? Authorizing Provider  amitriptyline (ELAVIL) 25 MG tablet Take 1 tablet (25 mg total) by mouth at bedtime. 10/24/17  Yes McNew, Ileene Hutchinson, MD  chlorproMAZINE (THORAZINE) 25 MG tablet Take 25 mg by mouth 2 (two) times daily. 09/05/20  Yes [provider]  cholecalciferol (VITAMIN D3) 25 MCG (1000 UNIT) tablet Take 1,000 Units by mouth daily.   Yes [provider]  glimepiride (AMARYL) 2 MG tablet Take 2 mg by mouth daily. 09/01/20  Yes [provider]  lovastatin (MEVACOR) 40 MG tablet Take 1 tablet (40 mg total)  by mouth at bedtime. 12/20/17  Yes Duanne Limerick, MD  metFORMIN (GLUCOPHAGE) 1000 MG tablet Take 1,000 mg by mouth 2 (two) times daily.   Yes [provider]  metoprolol tartrate (LOPRESSOR) 25 MG tablet Take 1 tablet (25 mg total) by mouth 2 (two) times daily. 11/11/20  Yes [name],  MD  paliperidone (INVEGA) 6 MG 24 hr tablet Take 6 mg by mouth daily. 09/05/20  Yes [provider]  polyethylene glycol (MIRALAX / GLYCOLAX) 17 g packet Take 17 g by mouth daily.   Yes [provider]  senna-docusate (SENOKOT-S) 8.6-50 MG tablet Take 1 tablet by mouth at bedtime. 11/11/20  Yes [name], MD  senna-docusate (SENOKOT-S) 8.6-50 MG tablet Take 1 tablet by mouth 2 (two) times daily. (0800 and 1600)   Yes [provider]  thiothixene (NAVANE) 2 MG capsule Take 1 capsule (2 mg total) by mouth 2 (two) times daily. 10/24/17  Yes McNew, Ileene Hutchinson, MD  vitamin C (ASCORBIC ACID) 500 MG tablet Take 500 mg by mouth 2 (two) times daily.   Yes [provider]  zinc gluconate 50 MG tablet Take 50 mg by mouth daily.   Yes [provider]   No Known Allergies Review of Systems  Unable to perform ROS  Physical Exam Constitutional:      Comments: Eyes closed.   Pulmonary:     Effort: Pulmonary effort is normal.    Vital Signs: BP 137/84 (BP Location: Left Arm)   Pulse 93   Temp 99.1 F (37.3 C)   Resp 15   Ht 5\' 2"  (1.575 m)   Wt 51.4 kg   SpO2 94%   BMI 20.73 kg/m  Pain Scale: PAINAD POSS *See Group Information*: 1-Acceptable,Awake and alert     SpO2: SpO2: 94 % O2 Device:SpO2: 94 % O2 Flow Rate: .O2 Flow Rate (L/min): 2 L/min  IO: Intake/output summary:  Intake/Output Summary (Last 24 hours) at 02/13/2021 1353 Last data filed at 02/13/2021 0115 Gross per 24 hour  Intake 969 ml  Output 950 ml  Net 19 ml    LBM: Last BM Date: 02/13/21 Baseline Weight: Weight: 50 kg Most recent weight: Weight: 51.4 kg       Time In: 1:10 Time Out:  2:00 Time Total: 50 min Greater than 50%  of this time was spent counseling and coordinating care related to the above assessment and plan.  Signed by: 04/15/21, NP   Please contact Palliative Medicine Team phone at [phone] for questions and concerns.  For individual provider: See [phone]

## 2021-02-13 NOTE — TOC Progression Note (Deleted)
Transition of Care [facility]) - Progression Note    Patient Details  Name: Erica Mueller MRN: [id number] Date of Birth: 05-25-1951  Transition [facility] Care [facility]) CM/SW Contact  Caryn Section, RN Phone Number: 02/13/2021, 10:19 AM  Clinical Narrative:  Patient to go for surgery this afternoon, also echo ordered and patient has positive blood cultures.  TOC to follow.     Expected Discharge Plan: Long Term Nursing Home Barriers to Discharge: Continued Medical Work up  Expected Discharge Plan and Services Expected Discharge Plan: Long Term Nursing Home In-house Referral: Clinical Social Work   Post Acute Care Choice: Nursing Home Living arrangements for the past 2 months:  [facility] cabin crew Health Care / Long Term Care Facility)                                       Social Determinants of Health (SDOH) Interventions    Readmission Risk Interventions No flowsheet data found.

## 2021-02-14 ENCOUNTER — Ambulatory Visit: Payer: Medicare Other | Admitting: Internal Medicine

## 2021-02-14 DIAGNOSIS — N179 Acute kidney failure, unspecified: Secondary | ICD-10-CM | POA: Diagnosis not present

## 2021-02-14 DIAGNOSIS — A419 Sepsis, unspecified organism: Secondary | ICD-10-CM | POA: Diagnosis not present

## 2021-02-14 DIAGNOSIS — R6520 Severe sepsis without septic shock: Secondary | ICD-10-CM | POA: Diagnosis not present

## 2021-02-14 DIAGNOSIS — Z7189 Other specified counseling: Secondary | ICD-10-CM | POA: Diagnosis not present

## 2021-02-14 LAB — GLUCOSE, CAPILLARY
Glucose-Capillary: 171 mg/dL — ABNORMAL HIGH (ref 70–99)
Glucose-Capillary: 174 mg/dL — ABNORMAL HIGH (ref 70–99)
Glucose-Capillary: 186 mg/dL — ABNORMAL HIGH (ref 70–99)
Glucose-Capillary: 213 mg/dL — ABNORMAL HIGH (ref 70–99)
Glucose-Capillary: 219 mg/dL — ABNORMAL HIGH (ref 70–99)
Glucose-Capillary: 255 mg/dL — ABNORMAL HIGH (ref 70–99)

## 2021-02-14 LAB — COMPREHENSIVE METABOLIC PANEL
ALT: 51 U/L — ABNORMAL HIGH (ref 0–44)
AST: 47 U/L — ABNORMAL HIGH (ref 15–41)
Albumin: 2 g/dL — ABNORMAL LOW (ref 3.5–5.0)
Alkaline Phosphatase: 45 U/L (ref 38–126)
Anion gap: 5 (ref 5–15)
BUN: 16 mg/dL (ref 8–23)
CO2: 27 mmol/L (ref 22–32)
Calcium: 8 mg/dL — ABNORMAL LOW (ref 8.9–10.3)
Chloride: 107 mmol/L (ref 98–111)
Creatinine, Ser: 0.54 mg/dL (ref 0.44–1.00)
GFR, Estimated: >60 mL/min (ref >60)
Glucose, Bld: 267 mg/dL — ABNORMAL HIGH (ref 70–99)
Potassium: 4.9 mmol/L (ref 3.5–5.1)
Sodium: 139 mmol/L (ref 135–145)
Total Bilirubin: 0.3 mg/dL (ref 0.3–1.2)
Total Protein: 5.1 g/dL — ABNORMAL LOW (ref 6.5–8.1)

## 2021-02-14 LAB — CULTURE, BLOOD (SINGLE)
Culture: NO GROWTH
Culture: NO GROWTH
Special Requests: ADEQUATE
Special Requests: ADEQUATE

## 2021-02-14 LAB — PHOSPHORUS: Phosphorus: 3.4 mg/dL (ref 2.5–4.6)

## 2021-02-14 LAB — MAGNESIUM: Magnesium: 2 mg/dL (ref 1.7–2.4)

## 2021-02-14 NOTE — Progress Notes (Signed)
Daily Progress Note   Patient Name: Erica Mueller       Date: 02/14/2021 DOB: [birth year]  Age: 70 y.o. MRN#: [id number] Attending Physician: Alberteen Sam, * Primary Care Physician: [facility] Admit Date: 02/07/2021  Reason for Consultation/Follow-up: Establishing goals of care  Subjective: Patient is resting in bed, no family at bedside. Feeding tube remians intact. Spoke with SLP, plans to see patient tomorrow for further evaluation. Will follow and speak with son following this.   Length of Stay: 7  Current Medications: Scheduled Meds:   vitamin C  500 mg Oral Daily   chlorhexidine  15 mL Mouth Rinse BID   Chlorhexidine Gluconate Cloth  6 each Topical Daily   free water  100 mL Per Tube Q4H   heparin  5,000 Units Subcutaneous Q8H   insulin aspart  0-15 Units Subcutaneous Q4H   insulin glargine-yfgn  14 Units Subcutaneous Daily   mouth rinse  15 mL Mouth Rinse q12n4p    Continuous Infusions:  feeding supplement (OSMOLITE 1.2 CAL) 55 mL/hr at 02/13/21 2000    PRN Meds: acetaminophen, acetaminophen, docusate sodium, morphine injection, ondansetron (ZOFRAN) IV, polyethylene glycol  Physical Exam Constitutional:      Comments: Eyes closed.   Pulmonary:     Effort: Pulmonary effort is normal.            Vital Signs: BP (!) 161/80 (BP Location: Left Arm)   Pulse 92   Temp 98 F (36.7 C)   Resp 19   Ht 5\' 2"  (1.575 m)   Wt 53.5 kg   SpO2 100%   BMI 21.57 kg/m  SpO2: SpO2: 100 % O2 Device: O2 Device: Room Air O2 Flow Rate: O2 Flow Rate (L/min): 2 L/min  Intake/output summary:  Intake/Output Summary (Last 24 hours) at 02/14/2021 1454 Last data filed at 02/14/2021 0500 Gross per 24 hour  Intake 755 ml  Output --  Net 755 ml   LBM: Last BM Date:  02/13/21 Baseline Weight: Weight: 50 kg Most recent weight: Weight: 53.5 kg        Patient Active Problem List   Diagnosis Date Noted   Protein-calorie malnutrition, severe 02/10/2021   Pressure injury of skin 02/09/2021   Sepsis (HCC) 02/07/2021   Acute metabolic encephalopathy 11/07/2020   Severe sepsis (HCC) 11/07/2020  UTI (urinary tract infection) 11/07/2020   GERD (gastroesophageal reflux disease) 12/20/2017   Hyperlipemia 12/20/2017   History of schizophrenia 07/30/2017   Schizophrenia (HCC) 05/27/2017   Diabetes (HCC) 04/02/2017   Hypertension 04/02/2017   Noncompliance 04/02/2017   Depression 11/17/2013    Palliative Care Assessment & Plan    Recommendations/Plan: Will continue to follow. SLP to see patient tomorrow.     Code Status:    Code Status Orders  (From admission, onward)           Start     Ordered   02/07/21 1806  Full code  Continuous        02/07/21 1809           Code Status History     Date Active Date Inactive Code Status Order ID Comments User Context   11/07/2020 2347 11/11/2020 2103 Full Code [id number]  Andris Baumann, MD ED   10/16/2017 0053 10/24/2017 1726 Full Code [id number]  Audery Amel, MD Inpatient   05/27/2017 0024 05/30/2017 1929 Full Code [id number]  Clapacs, Jackquline Denmark, MD Inpatient       Prognosis:  Poor     Care plan was discussed with SLP and nurse  Thank you for allowing the Palliative Medicine Team to assist in the care of this patient.       Total Time 15 min Prolonged Time Billed  no       Greater than 50%  of this time was spent counseling and coordinating care related to the above assessment and plan.  Morton Stall, NP  Please contact Palliative Medicine Team phone at [phone] for questions and concerns.

## 2021-02-14 NOTE — Progress Notes (Signed)
[facility] PROGRESS NOTE    Erica MARCZEWSKI  ZOX:[id number] DOB: 08/26/1950 DOA: 02/07/2021 PCP: [name]      Brief Narrative:  Mrs. Devega is a 70 y.o. F with CAD, DM, HTN, dementia, and schizophrenia, lives in [place] since May 2022 UTI sepsis who presented with acute somnolence for 1 day.  At baseline, patient able to ambluate and answer questions with one word answers but required assistance to bathe, dress, toilet and not able to engage in conversation.  Per family, she tested positive for COVID "2 weeks ago" and has steadily declined since then.  On the day of admission, she was found at her facility to be somnolent, sent to ER.  In the ER, tachycardic to 120s, tachypneic to 40s, hypotensive initially, GCS 5.  Na 166. Glu 260, Cr 1.4 up from baseline 0.5, Lactate 2.3.  UA suggestive of UTI, CXR clear. Started on fluids, antibiotics and admitted to ICU.      8/30: Admitted to ICU, nephrology consulted 8/31: Transferred to stepdown  9/2: NG tube placed 9/6: No appreciable change or improvement in mentation since admission         Assessment & Plan:  Severe sepsis likely due to aspiration pneumonia Presented with tachycardia, tachypnea, encephalopathy, cardiomyopathy and AKI.  Initially thought to be UTI, but culture negative, further chest imaging showed consolidation in RUL/RLL consistent with aspiration.  Completed 7 days Rocephin, no further fever.     Hypernatremia Na 166 on admission, treated with hypotonic saline, now resolved and stable. - Repeat BMP tomorrow  Hypokalemia Repleted and resolved  Hypophosphatemia Treated and resolved  Transaminitis May be related to COVID, versus congestive hepatopathy, versus resolving ischemic injury due to sepsis, stable.   Resolved COVID Patient diagnosed 8/18, had no symptoms.  Well outside isolation period.   Acute on chronic metabolic encephalopathy At baseline, the patient lived with  family until May, was semi-independent, had noted memory loss, but nothing significant per son.  After May admission, she has been more cognitively impaired, was in short-term rehab for time, then transitioned to long-term care given her persistent cognitive impairment (likely progression of dementia) and worsnening functional status.    Recent notes from her facility indicate that she has been quite cognitively impaired recently, with limited interaction.  Then she was diagnosed with COVID 2 weeks prior to admission and took a turn for the worse, was admitted here with sodium 166 and sepsis.    Since admission here, she has been nonverbal, does not follow commands.  This is multifactorial due to sepsis, hypernatremia, COVID, dementia.  Still no significant change.  At this point, given her limited functional status prior to this adimssion, and lack of response to therapy here, there is a high degree of certainty that she will remain bedbound, nonverbal, unable to communicate in any way or follow commands, and dependent on a feeding tube for fluids and nutrition.  - Hold psychoactive meds amitriptyline, thorazine, thiothixine and Invega    Elevated troponin Cardiomyopathy Echo 8/30 showed reduced EF, new finding, without symptoms of CHF.  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  Pending goals of care, will consider further work up with Cardiology. At the moment, I do not believe she is a candidate for LHC. - Monitor clinically    Dysphagia SLP evaluated patient.  Her swallowing function is without significant aspiration, but due to cognitive impairment, requires significant prompting and stimulation to swallow even small amounts.  Feeding would not  be feasible by hand even in ideal circumstances, and as above, since her cognitive impairment is not likely to recover, she requires feeding tube at this time.   - NPO  - Continue temporary NG - Consult Palliative Care  - I have been  frank with son LC, sister Alvino Chapel, brother [name], that the patient's current state is likely terminal; they will continue discussions with Palliative care    Acute respiratory failure ruled out  Diabetes Glucose normal - Continue Lantus - Continue correction insulin - Hold home metformin and sulfonylurea  Coronary disease Hypertension Blood pressure elevated - Resume lovastatin and metoprolol  Schizophrenia - Hold amitriptyline, thorazine, thiothixine and Invega until mentation better  Possible dementia  Nonsustained VT Asymptomatic, hemodynamically stable.  Mag and K replete.  Expected given cardiomyopathy.     Acute kidney injury Baseline Cr 0.6, here was more than doubled on admission >1.4 an dimproved to normal over 3 days with fluids  Stage II pressure injury sacrum, POA  Thrombocytopenia due to sepsis  Severe protein calorie malnutrition As evidenced by sevrely reduced muscle mass and fat, diffusely              Disposition: Status is: Inpatient  Remains inpatient appropriate because:Altered mental status and IV treatments appropriate due to intensity of illness or inability to take PO  Dispo: The patient is from: SNF              Anticipated d/c is to: SNF              Patient currently is not medically stable to d/c.   Difficult to place patient No   Level of care: Med-Surg  Patient presented with acute metabolic encephalopathy due to hypernatremia, renal insufficiency, and likely sepsis.  Sodium has been corrected, her sepsis has been stabilized, but she is still densely encephalopathic.  I suspect this is permanent and terminal, and we will continue supportive cares and facilitate goals fo care discussions with family     Likely discharge to inpatient Hospice veruss placement of PEG to allow return to nursing home, terminally bedbound, nonverbal and feeding tube dependent            MDM: The below labs and imaging reports were reviewed  and summarized above.  Medication management as above.    DVT prophylaxis: heparin injection 5,000 Units Start: 02/07/21 2200 SCDs Start: 02/07/21 1805  Code Status: FULL Family Communication:  Brother [name], sister Alvino Chapel by phone.  Tried again to call daughter Hoyle Barr, no answer    Consultants:  [facility] Nephrology  Procedures:  8/30 CTA chest -- no PE, bilateral infiltrates 8/30 Doppler US -- negative for DVT 8/30: echo: reduced EF, new finding  Antimicrobials:  Vancomycin Flagyl x1 on 8/30 Cefepime 8/30 >> 9/1 Rocephin 9/1>> 9/5  Culture data:   8/30 urine culture no growth 8/30 BCx ngtd          Subjective: No new fever, seizures, respiratory distress, vomiting, agitation, obvious pain.  Patient is nonverbal     Objective: Vitals:   02/14/21 0326 02/14/21 0500 02/14/21 0800 02/14/21 0841  BP: 140/80  (!) 150/58 (!) 161/80  Pulse: 93  96 92  Resp: 16  18 19   Temp: 97.9 F (36.6 C)  (!) 97.5 F (36.4 C) 98 F (36.7 C)  TempSrc:   Oral   SpO2: 99%   100%  Weight:  53.5 kg    Height:        Intake/Output Summary (Last 24  hours) at 02/14/2021 1455 Last data filed at 02/14/2021 0500 Gross per 24 hour  Intake 755 ml  Output --  Net 755 ml   Filed Weights   02/10/21 0500 02/11/21 0500 02/14/21 0500  Weight: 49.4 kg 51.4 kg 53.5 kg    Examination: General appearance: Adult female, lying in bed, appears debilitated, no agitation or restlessness     HEENT: NG tube in place, anicteric, lids and lashes normal.  No nasal deformity, discharge, or epistaxis.  Does not open mouth for examination, lips look normal, has a smacking movement of her mouth Skin: No suspicious rashes or lesions, skin warm and dry Cardiac: RRR, no murmurs, no lower extremity edema, no JVD Respiratory: Normal respiratory rate and rhythm, lungs clear without rales or wheezes Abdomen: Abdomen soft, no grimace to palpation, no guarding, no ascites or distention MSK: Reduced muscle mass and  fat Neuro: Seems to resist when I pull all 4 extremities, but does not follow commands, does not make any purposeful movements, does not lift the arms, seems to briefly make eye contact, but does not track me around the room, no spontaneous verbalizations, pupils equal. Psych: Nonverbal    Data Reviewed: I have personally reviewed following labs and imaging studies:  CBC: Recent Labs  Lab 02/07/21 1516 02/08/21 0405 02/09/21 0114 02/10/21 0930 02/13/21 0629  WBC 17.2* 13.8* 9.1 6.8 6.5  NEUTROABS 15.3*  --   --   --   --   HGB 15.1* 12.9 11.7* 12.4 10.9*  HCT 50.5* 43.9 38.3 39.5 33.8*  MCV 100.4* 98.9 99.0 96.1 93.9  PLT 159 118* 111* 112* 169   Basic Metabolic Panel: Recent Labs  Lab 02/09/21 0114 02/09/21 1242 02/09/21 2114 02/10/21 0930 02/11/21 0601 02/12/21 0631 02/13/21 0629 02/14/21 0412  NA 159*   < > 149* 144 144 142 140  --   K 2.8*  --   --  3.5 3.1* 4.1 3.9  --   CL 125*  --   --  111 108 108 107  --   CO2 28  --   --  28 29 29 29   --   GLUCOSE 89  --   --  188* 221* 235* 138*  --   BUN 26*  --   --  18 14 16 15   --   CREATININE 0.75  --   --  0.59 0.68 0.68 0.51  --   CALCIUM 8.7*  --   --  7.8* 8.1* 8.2* 8.1*  --   MG 2.2  --   --  2.1 2.0 2.0 2.0 2.0  PHOS 1.7*  --   --  2.5 2.3* 2.7 2.4* 3.4   < > = values in this interval not displayed.   GFR: Estimated Creatinine Clearance: 51.8 mL/min (by C-G formula based on SCr of 0.51 mg/dL). Liver Function Tests: Recent Labs  Lab 02/08/21 0850 02/09/21 0114 02/10/21 0930 02/11/21 0601 02/12/21 0631  AST 20 33 38 50* 65*  ALT 11 13 19 26  50*  ALKPHOS 46 45 50 48 53  BILITOT 0.6 0.5 0.6 0.5 0.3  PROT 6.5 6.3* 6.1* 5.7* 5.8*  ALBUMIN 2.7* 2.4* 2.3* 2.1* 2.2*   No results for input(s): LIPASE, AMYLASE in the last 168 hours. No results for input(s): AMMONIA in the last 168 hours. Coagulation Profile: Recent Labs  Lab 02/07/21 1516  INR 1.5*   Cardiac Enzymes: No results for input(s): CKTOTAL,  CKMB, CKMBINDEX, TROPONINI in the last 168 hours. BNP (last  3 results) No results for input(s): PROBNP in the last 8760 hours. HbA1C: No results for input(s): HGBA1C in the last 72 hours.  CBG: Recent Labs  Lab 02/13/21 1953 02/14/21 0040 02/14/21 0328 02/14/21 0726 02/14/21 1116  GLUCAP 154* 219* 255* 186* 174*   Lipid Profile: No results for input(s): CHOL, HDL, LDLCALC, TRIG, CHOLHDL, LDLDIRECT in the last 72 hours. Thyroid Function Tests: No results for input(s): TSH, T4TOTAL, FREET4, T3FREE, THYROIDAB in the last 72 hours.  Anemia Panel: No results for input(s): VITAMINB12, FOLATE, FERRITIN, TIBC, IRON, RETICCTPCT in the last 72 hours. Urine analysis:    Component Value Date/Time   COLORURINE YELLOW (A) 02/07/2021 1650   APPEARANCEUR CLOUDY (A) 02/07/2021 1650   LABSPEC 1.026 02/07/2021 1650   PHURINE 5.0 02/07/2021 1650   GLUCOSEU 50 (A) 02/07/2021 1650   HGBUR MODERATE (A) 02/07/2021 1650   BILIRUBINUR NEGATIVE 02/07/2021 1650   KETONESUR 5 (A) 02/07/2021 1650   PROTEINUR 30 (A) 02/07/2021 1650   NITRITE NEGATIVE 02/07/2021 1650   LEUKOCYTESUR MODERATE (A) 02/07/2021 1650   Sepsis Labs: @LABRCNTIP (procalcitonin:4,lacticacidven:4)  ) Recent Results (from the past 240 hour(s))  Resp Panel by RT-PCR (Flu A&B, Covid) Nasopharyngeal Swab     Status: Abnormal   Collection Time: 02/07/21  3:16 PM   Specimen: Nasopharyngeal Swab; Nasopharyngeal(NP) swabs in vial transport medium  Result Value Ref Range Status   SARS Coronavirus 2 by RT PCR POSITIVE (A) NEGATIVE Final    Comment: RESULT CALLED TO, READ BACK BY AND VERIFIED WITH: MATT BASSETT 02/07/21 1644 AMK (NOTE) SARS-CoV-2 target nucleic acids are DETECTED.  The SARS-CoV-2 RNA is generally detectable in upper respiratory specimens during the acute phase of infection. Positive results are indicative of the presence of the identified virus, but do not rule out bacterial infection or co-infection with other  pathogens not detected by the test. Clinical correlation with patient history and other diagnostic information is necessary to determine patient infection status. The expected result is Negative.  Fact Sheet for Patients: 02/09/21  Fact Sheet for Healthcare Providers: BloggerCourse.com  This test is not yet approved or cleared by the [employer]ious[employer].[place] FDA and  has been authorized for detection and/or diagnosis of SARS-CoV-2 by FDA under an Emergency Use Authorization (EUA).  This EUA will remain in effect (meaning this test can be use d) for the duration of  the COVID-19 declaration under Section 564(b)(1) of the Act, 21 U.S.C. section 360bbb-3(b)(1), unless the authorization is terminated or revoked sooner.     Influenza A by PCR NEGATIVE NEGATIVE Final   Influenza B by PCR NEGATIVE NEGATIVE Final    Comment: (NOTE) The Xpert Xpress SARS-CoV-2/FLU/RSV plus assay is intended as an aid in the diagnosis of influenza from Nasopharyngeal swab specimens and should not be used as a sole basis for treatment. Nasal washings and aspirates are unacceptable for Xpert Xpress SARS-CoV-2/FLU/RSV testing.  Fact Sheet for Patients: [place]  Fact Sheet for Healthcare Providers: BloggerCourse.com  This test is not yet approved or cleared by the Serious[employer]ker.it FDA and has been authorized for detection and/or diagnosis of SARS-CoV-2 by FDA under an Emergency Use Authorization (EUA). This EUA will remain in effect (meaning this test can be used) for the duration of the COVID-19 declaration under Section 564(b)(1) of the Act, 21 U.S.C. section 360bbb-3(b)(1), unless the authorization is terminated or revoked.  Performed at [facility], [address] [place], [place]   Blood culture (routine single)     Status: None  Collection Time: 02/07/21  3:37 PM    Specimen: BLOOD  Result Value Ref Range Status   Specimen Description BLOOD RIGHT ANTECUBITAL  Final   Special Requests   Final    BOTTLES DRAWN AEROBIC AND ANAEROBIC Blood Culture adequate volume   Culture   Final    NO GROWTH 7 DAYS Performed at [facility], [address] [place], [place] [address]    Report Status 02/14/2021 FINAL  Final  Culture, blood (single)     Status: None   Collection Time: 02/07/21  3:50 PM   Specimen: BLOOD  Result Value Ref Range Status   Specimen Description BLOOD LEFT ASSIST CONTROL  Final   Special Requests   Final    BOTTLES DRAWN AEROBIC AND ANAEROBIC Blood Culture adequate volume   Culture   Final    NO GROWTH 7 DAYS Performed at [facility], [address] [place], [place] [address]    Report Status 02/14/2021 FINAL  Final  Urine Culture     Status: None   Collection Time: 02/07/21  4:50 PM   Specimen: In/Out Cath Urine  Result Value Ref Range Status   Specimen Description   Final    IN/OUT CATH URINE Performed at [facility], [address] [place], [place] [address]    Special Requests   Final    NONE Performed at [facility], [address] [place], [place] [address]    Culture   Final    NO GROWTH Performed at [facility], [address] [place], [place] [address]    Report Status 02/09/2021 FINAL  Final  MRSA Next Gen by PCR, Nasal     Status: None   Collection Time: 02/09/21  9:47 AM   Specimen: Nasal Mucosa; Nasal Swab  Result Value Ref Range Status   MRSA by PCR Next Gen NOT DETECTED NOT DETECTED Final    Comment: (NOTE) The GeneXpert MRSA Assay (FDA approved for NASAL specimens only), is one component of a comprehensive MRSA colonization surveillance program. It is not intended to diagnose MRSA infection nor to guide or monitor treatment for MRSA infections. Test performance is not FDA approved in patients less than 70 years old. Performed at [facility],  [address] [place], [place] [address]          Radiology Studies: No results found.      Scheduled Meds:  vitamin C  500 mg Oral Daily   chlorhexidine  15 mL Mouth Rinse BID   Chlorhexidine Gluconate Cloth  6 each Topical Daily   free water  100 mL Per Tube Q4H   heparin  5,000 Units Subcutaneous Q8H   insulin aspart  0-15 Units Subcutaneous Q4H   insulin glargine-yfgn  14 Units Subcutaneous Daily   mouth rinse  15 mL Mouth Rinse q12n4p   Continuous Infusions:  feeding supplement (OSMOLITE 1.2 CAL) 55 mL/hr at 02/13/21 2000     LOS: 7 days    Time spent: 35 minutes    Alberteen Samhristopher P Ellayna Hilligoss, MD [facility] 02/14/2021, 2:55 PM     Please page though AMION or Epic secure chat:  For [employer] password, Higher education careers advisercontact charge nurse

## 2021-02-15 DIAGNOSIS — G9341 Metabolic encephalopathy: Secondary | ICD-10-CM | POA: Diagnosis not present

## 2021-02-15 DIAGNOSIS — I428 Other cardiomyopathies: Secondary | ICD-10-CM

## 2021-02-15 DIAGNOSIS — Z7189 Other specified counseling: Secondary | ICD-10-CM | POA: Diagnosis not present

## 2021-02-15 DIAGNOSIS — R1319 Other dysphagia: Secondary | ICD-10-CM

## 2021-02-15 LAB — GLUCOSE, CAPILLARY
Glucose-Capillary: 136 mg/dL — ABNORMAL HIGH (ref 70–99)
Glucose-Capillary: 190 mg/dL — ABNORMAL HIGH (ref 70–99)
Glucose-Capillary: 190 mg/dL — ABNORMAL HIGH (ref 70–99)
Glucose-Capillary: 197 mg/dL — ABNORMAL HIGH (ref 70–99)
Glucose-Capillary: 199 mg/dL — ABNORMAL HIGH (ref 70–99)
Glucose-Capillary: 202 mg/dL — ABNORMAL HIGH (ref 70–99)
Glucose-Capillary: 221 mg/dL — ABNORMAL HIGH (ref 70–99)
Glucose-Capillary: 246 mg/dL — ABNORMAL HIGH (ref 70–99)

## 2021-02-15 LAB — COMPREHENSIVE METABOLIC PANEL
ALT: 50 U/L — ABNORMAL HIGH (ref 0–44)
AST: 47 U/L — ABNORMAL HIGH (ref 15–41)
Albumin: 2.1 g/dL — ABNORMAL LOW (ref 3.5–5.0)
Alkaline Phosphatase: 47 U/L (ref 38–126)
Anion gap: 5 (ref 5–15)
BUN: 14 mg/dL (ref 8–23)
CO2: 27 mmol/L (ref 22–32)
Calcium: 8.3 mg/dL — ABNORMAL LOW (ref 8.9–10.3)
Chloride: 107 mmol/L (ref 98–111)
Creatinine, Ser: 0.44 mg/dL (ref 0.44–1.00)
GFR, Estimated: >60 mL/min (ref >60)
Glucose, Bld: 218 mg/dL — ABNORMAL HIGH (ref 70–99)
Potassium: 4.5 mmol/L (ref 3.5–5.1)
Sodium: 139 mmol/L (ref 135–145)
Total Bilirubin: 0.3 mg/dL (ref 0.3–1.2)
Total Protein: 5.5 g/dL — ABNORMAL LOW (ref 6.5–8.1)

## 2021-02-15 NOTE — Progress Notes (Addendum)
Daily Progress Note   Patient Name: Erica Mueller       Date: 02/15/2021 DOB: [birth year]  Age: 70 y.o. MRN#: [id number] Attending Physician: Charise Killian, MD Primary Care Physician: [facility] Admit Date: 02/07/2021  Reason for Consultation/Follow-up: Establishing goals of care  Subjective: Patient is resting in bed. No family at bedside. She does not arouse to voice or touch. Respirations are even and unlabored. Attempted to reach son unsuccessfully. Will need to speak with son and daughter regarding care moving forward.    Length of Stay: 8  Current Medications: Scheduled Meds:   vitamin C  500 mg Oral Daily   chlorhexidine  15 mL Mouth Rinse BID   Chlorhexidine Gluconate Cloth  6 each Topical Daily   free water  100 mL Per Tube Q4H   heparin  5,000 Units Subcutaneous Q8H   insulin aspart  0-15 Units Subcutaneous Q4H   insulin glargine-yfgn  14 Units Subcutaneous Daily   mouth rinse  15 mL Mouth Rinse q12n4p    Continuous Infusions:  feeding supplement (OSMOLITE 1.2 CAL) 55 mL/hr at 02/14/21 1944    PRN Meds: acetaminophen, acetaminophen, docusate sodium, morphine injection, ondansetron (ZOFRAN) IV, polyethylene glycol  Physical Exam Constitutional:      Comments: Eyes closed.             Vital Signs: BP (!) 165/59 (BP Location: Right Arm)   Pulse 85   Temp (!) 97 F (36.1 C)   Resp 15   Ht 5\' 2"  (1.575 m)   Wt 53.1 kg   SpO2 98%   BMI 21.41 kg/m  SpO2: SpO2: 98 % O2 Device: O2 Device: Room Air O2 Flow Rate: O2 Flow Rate (L/min): 2 L/min  Intake/output summary:  Intake/Output Summary (Last 24 hours) at 02/15/2021 1553 Last data filed at 02/15/2021 1000 Gross per 24 hour  Intake --  Output 850 ml  Net -850 ml   LBM: Last BM Date:  02/15/21 Baseline Weight: Weight: 50 kg Most recent weight: Weight: 53.1 kg         Patient Active Problem List   Diagnosis Date Noted   Protein-calorie malnutrition, severe 02/10/2021   Pressure injury of skin 02/09/2021   Sepsis (HCC) 02/07/2021   Acute metabolic encephalopathy 11/07/2020   Severe sepsis (HCC) 11/07/2020  UTI (urinary tract infection) 11/07/2020   GERD (gastroesophageal reflux disease) 12/20/2017   Hyperlipemia 12/20/2017   History of schizophrenia 07/30/2017   Schizophrenia (HCC) 05/27/2017   Diabetes (HCC) 04/02/2017   Hypertension 04/02/2017   Noncompliance 04/02/2017   Depression 11/17/2013    Palliative Care Assessment & Plan   Recommendations/Plan: Unable to reach son. Will need to speak with son and daughter about care moving forward.     Code Status:    Code Status Orders  (From admission, onward)           Start     Ordered   02/07/21 1806  Full code  Continuous        02/07/21 1809           Code Status History     Date Active Date Inactive Code Status Order ID Comments User Context   11/07/2020 2347 11/11/2020 2103 Full Code [id number]  Andris Baumann, MD ED   10/16/2017 0053 10/24/2017 1726 Full Code [id number]  Audery Amel, MD Inpatient   05/27/2017 0024 05/30/2017 1929 Full Code [id number]  Clapacs, Jackquline Denmark, MD Inpatient       Prognosis:  poor    Thank you for allowing the Palliative Medicine Team to assist in the care of this patient.       Total Time 15 min Prolonged Time Billed  no       Greater than 50%  of this time was spent counseling and coordinating care related to the above assessment and plan.  Morton Stall, NP  Please contact Palliative Medicine Team phone at [phone] for questions and concerns.

## 2021-02-15 NOTE — Progress Notes (Addendum)
PROGRESS NOTE    Erica Mueller  HOO:[id number] DOB: 24-Dec-1950 DOA: 02/07/2021 PCP: [facility]   Assessment & Plan:   Active Problems:   Sepsis ([place])   Pressure injury of skin   Protein-calorie malnutrition, severe   Severe sepsis: likely due to aspiration pneumonia. Met criteria w/ tachycardia, tachypnea, encephalopathy, & pneumonia. Initially thought to be UTI, but culture negative, further chest imaging showed consolidation in RUL/RLL consistent with aspiration.  Completed abx course  Aspiration pneumonia: completed abx course. Continue on bronchodilators and encourage incentive spirometry    Hypernatremia: resolved    Hypokalemia: WNL today    Hypophosphatemia: WNL   Transaminitis: etiology unclear, COVID19 vs congestive hepatopathy vs resolving ischemic injury due to sepsis  COVID19 infection: diagnosed 8/18, had no symptoms.  Well outside isolation period.   Acute on chronic metabolic encephalopathy: worsening recently. Dependent on feeding tube for fluids & nutrition. Will likely remain bedbound, nonverbal, does not follow commands   Cardiomyopathy: w/ elevated tropoins. Echo 8/30 showed reduced EF, new finding, without symptoms of CHF.  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  Pending goals of care, will consider further work up with Cardiology.   Dysphagia: continue w/ temporary NG tube. Palliative care consulted   DM2: likely poorly controlled. Continue on glargine, SSI w/ accuchecks    CAD: continue on metoprolol, statin   HLD: continue on statin   HTN: continue on metoprolol   Schizophrenia: continue to hold amitriptyline, thorazine, thiothixine and Invega until mentation better. Possible dementia    Nonsustained VT: asymptomatic, hemodynamically stable.    AKI: baseline Cr 0.6. Cr is labile   Stage II pressure injury sacrum: present on admission. Continue w/ wound care    Thrombocytopenia: secondary to sepsis. Will continue to monitor     Severe protein calorie malnutrition: continue w/ tube feeds   DVT prophylaxis: heparin  Code Status:  full  Family Communication: called pt's son and granddaughter and no answer Disposition Plan:  unclear.  Level of care: Med-Surg _0 Status is: Inpatient  Remains inpatient appropriate because:Altered mental status, Unsafe d/c plan, IV treatments appropriate due to intensity of illness or inability to take PO, and Inpatient level of care appropriate due to severity of illness  Dispo: The patient is from: Home              Anticipated d/c is to:  unclear              Patient currently is medically unstable for d/c   Difficult to place patient : unclear   Consultants:  Palliative care  Procedures: (  Antimicrobials:   Subjective: Pt does not verbalized any complaints  Objective: Vitals:   02/14/21 2357 02/15/21 0415 02/15/21 0439 02/15/21 0753  BP: (!) 158/54  (!) 152/51 (!) 148/75  Pulse: 93  98 92  Resp: _1 Temp: (!) 97.5 F (36.4 C)  (!) 97.5 F (36.4 C) 97.6 F (36.4 C)  TempSrc:      SpO2: 100%  92%   Weight:  53.1 kg    Height:        Intake/Output Summary (Last 24 hours) at 02/15/2021 0840 Last data filed at 02/14/2021 2051 Gross per 24 hour  Intake --  Output 650 ml  Net -650 ml   Filed Weights   02/11/21 0500 02/14/21 0500 02/15/21 0415  Weight: 51.4 kg 53.5 kg 53.1 kg    Examination:  General exam: Appears lethargic. Poor dentition  Respiratory system:  Clear to auscultation. Respiratory effort normal. Cardiovascular system: S1 & S2 +. No  rubs, gallops or clicks.  Gastrointestinal system: Abdomen is nondistended, soft and nontender. Normal bowel sounds heard. Central nervous system: lethargic. Moves all extremities Psychiatry: Judgement and insight appear abnormal. Flat mood and affect    Data Reviewed: I have personally reviewed following labs and imaging studies  CBC: Recent Labs  Lab 02/09/21 0114 02/10/21 0930  02/13/21 0629  WBC 9.1 6.8 6.5  HGB 11.7* 12.4 10.9*  HCT 38.3 39.5 33.8*  MCV 99.0 96.1 93.9  PLT 111* 112* 350   Basic Metabolic Panel: Recent Labs  Lab 02/10/21 0930 02/11/21 0601 02/12/21 0631 02/13/21 0629 02/14/21 0412 02/15/21 0406  NA 144 144 142 140 139 139  K 3.5 3.1* 4.1 3.9 4.9 4.5  CL 111 108 108 107 107 107  CO2 _0 GLUCOSE 188* 221* 235* 138* 267* 218*  BUN _1 CREATININE 0.59 0.68 0.68 0.51 0.54 0.44  CALCIUM 7.8* 8.1* 8.2* 8.1* 8.0* 8.3*  MG 2.1 2.0 2.0 2.0 2.0  --   PHOS 2.5 2.3* 2.7 2.4* 3.4  --    GFR: Estimated Creatinine Clearance: 51.8 mL/min (by C-G formula based on SCr of 0.44 mg/dL). Liver Function Tests: Recent Labs  Lab 02/10/21 0930 02/11/21 0601 02/12/21 0631 02/14/21 0412 02/15/21 0406  AST 38 50* 65* 47* 47*  ALT 19 26 50* 51* 50*  ALKPHOS 50 48 53 45 47  BILITOT 0.6 0.5 0.3 0.3 0.3  PROT 6.1* 5.7* 5.8* 5.1* 5.5*  ALBUMIN 2.3* 2.1* 2.2* 2.0* 2.1*   No results for input(s): LIPASE, AMYLASE in the last 168 hours. No results for input(s): AMMONIA in the last 168 hours. Coagulation Profile: No results for input(s): INR, PROTIME in the last 168 hours. Cardiac Enzymes: No results for input(s): CKTOTAL, CKMB, CKMBINDEX, TROPONINI in the last 168 hours. BNP (last 3 results) No results for input(s): PROBNP in the last 8760 hours. HbA1C: No results for input(s): HGBA1C in the last 72 hours. CBG: Recent Labs  Lab 02/14/21 1542 02/14/21 2045 02/15/21 0001 02/15/21 0439 02/15/21 0725  GLUCAP 213* 171* 136* 202* 246*   Lipid Profile: No results for input(s): CHOL, HDL, LDLCALC, TRIG, CHOLHDL, LDLDIRECT in the last 72 hours. Thyroid Function Tests: No results for input(s): TSH, T4TOTAL, FREET4, T3FREE, THYROIDAB in the last 72 hours. Anemia Panel: No results for input(s): VITAMINB12, FOLATE, FERRITIN, TIBC, IRON, RETICCTPCT in the last 72 hours. Sepsis Labs: Recent Labs  Lab 02/09/21 0114   PROCALCITON <0.10    Recent Results (from the past 240 hour(s))  Resp Panel by RT-PCR (Flu A&B, Covid) Nasopharyngeal Swab     Status: Abnormal   Collection Time: 02/07/21  3:16 PM   Specimen: Nasopharyngeal Swab; Nasopharyngeal(NP) swabs in vial transport medium  Result Value Ref Range Status   SARS Coronavirus 2 by RT PCR POSITIVE (A) NEGATIVE Final    Comment: RESULT CALLED TO, READ BACK BY AND VERIFIED WITH: MATT BASSETT 02/07/21 1644 AMK (NOTE) SARS-CoV-2 target nucleic acids are DETECTED.  The SARS-CoV-2 RNA is generally detectable in upper respiratory specimens during the acute phase of infection. Positive results are indicative of the presence of the identified virus, but do not rule out bacterial infection or co-infection with other pathogens not detected by the test. Clinical correlation with patient history and other diagnostic information is necessary to determine patient infection status. The expected result is Negative.  Fact  Sheet for Patients: EntrepreneurPulse.com.au  Fact Sheet for Healthcare Providers: IncredibleEmployment.be  This test is not yet approved or cleared by the [place] FDA and  has been authorized for detection and/or diagnosis of SARS-CoV-2 by FDA under an Emergency Use Authorization (EUA).  This EUA will remain in effect (meaning this test can be use d) for the duration of  the COVID-19 declaration under Section 564(b)(1) of the Act, 21 U.S.C. section 360bbb-3(b)(1), unless the authorization is terminated or revoked sooner.     Influenza A by PCR NEGATIVE NEGATIVE Final   Influenza B by PCR NEGATIVE NEGATIVE Final    Comment: (NOTE) The Xpert Xpress SARS-CoV-2/FLU/RSV plus assay is intended as an aid in the diagnosis of influenza from Nasopharyngeal swab specimens and should not be used as a sole basis for treatment. Nasal washings and aspirates are unacceptable for Xpert Xpress  SARS-CoV-2/FLU/RSV testing.  Fact Sheet for Patients: EntrepreneurPulse.com.au  Fact Sheet for Healthcare Providers: IncredibleEmployment.be  This test is not yet approved or cleared by the [place] FDA and has been authorized for detection and/or diagnosis of SARS-CoV-2 by FDA under an Emergency Use Authorization (EUA). This EUA will remain in effect (meaning this test can be used) for the duration of the COVID-19 declaration under Section 564(b)(1) of the Act, 21 U.S.C. section 360bbb-3(b)(1), unless the authorization is terminated or revoked.  Performed at [facility], [address] [place], [place] [address]   Blood culture (routine single)     Status: None   Collection Time: 02/07/21  3:37 PM   Specimen: BLOOD  Result Value Ref Range Status   Specimen Description BLOOD RIGHT ANTECUBITAL  Final   Special Requests   Final    BOTTLES DRAWN AEROBIC AND ANAEROBIC Blood Culture adequate volume   Culture   Final    NO GROWTH 7 DAYS Performed at [facility], [address], [place], [place] [address]    Report Status 02/14/2021 FINAL  Final  Culture, blood (single)     Status: None   Collection Time: 02/07/21  3:50 PM   Specimen: BLOOD  Result Value Ref Range Status   Specimen Description BLOOD LEFT ASSIST CONTROL  Final   Special Requests   Final    BOTTLES DRAWN AEROBIC AND ANAEROBIC Blood Culture adequate volume   Culture   Final    NO GROWTH 7 DAYS Performed at [facility], [address], [place], [place] [address]    Report Status 02/14/2021 FINAL  Final  Urine Culture     Status: None   Collection Time: 02/07/21  4:50 PM   Specimen: In/Out Cath Urine  Result Value Ref Range Status   Specimen Description   Final    IN/OUT CATH URINE Performed at [facility], [address], [place], [place] [address]    Special Requests   Final    NONE Performed at [facility], [address] [place], [place] [address]    Culture   Final    NO GROWTH Performed at [facility], [place] [address] [place],  [address]    Report Status 02/09/2021 FINAL  Final  MRSA Next Gen by PCR, Nasal     Status: None   Collection Time: 02/09/21  9:47 AM   Specimen: Nasal Mucosa; Nasal Swab  Result Value Ref Range Status   MRSA by PCR Next Gen NOT DETECTED NOT DETECTED Final    Comment: (NOTE) The GeneXpert MRSA Assay (FDA approved for  NASAL specimens only), is one component of a comprehensive MRSA colonization surveillance program. It is not intended to diagnose MRSA infection nor to guide or monitor treatment for MRSA infections. Test performance is not FDA approved in patients less than 62 years old. Performed at [facility], [address] [place], [place] [address]          Radiology Studies: No results found.      Scheduled Meds:  vitamin C  500 mg Oral Daily   chlorhexidine  15 mL Mouth Rinse BID   Chlorhexidine Gluconate Cloth  6 each Topical Daily   free water  100 mL Per Tube Q4H   heparin  5,000 Units Subcutaneous Q8H   insulin aspart  0-15 Units Subcutaneous Q4H   insulin glargine-yfgn  14 Units Subcutaneous Daily   mouth rinse  15 mL Mouth Rinse q12n4p   Continuous Infusions:  feeding supplement (OSMOLITE 1.2 CAL) 55 mL/hr at 02/14/21 1944     LOS: 8 days    Time spent: 32 mins     Wyvonnia Dusky, MD [facility] Pager [phone]  If 7PM-7AM, please contact night-coverage 02/15/2021, 8:40 AM

## 2021-02-16 ENCOUNTER — Inpatient Hospital Stay: Payer: Medicare Other

## 2021-02-16 DIAGNOSIS — Z7189 Other specified counseling: Secondary | ICD-10-CM | POA: Diagnosis not present

## 2021-02-16 DIAGNOSIS — G9341 Metabolic encephalopathy: Secondary | ICD-10-CM | POA: Diagnosis not present

## 2021-02-16 DIAGNOSIS — R1319 Other dysphagia: Secondary | ICD-10-CM | POA: Diagnosis not present

## 2021-02-16 DIAGNOSIS — I428 Other cardiomyopathies: Secondary | ICD-10-CM | POA: Diagnosis not present

## 2021-02-16 LAB — COMPREHENSIVE METABOLIC PANEL
ALT: 60 U/L — ABNORMAL HIGH (ref 0–44)
AST: 55 U/L — ABNORMAL HIGH (ref 15–41)
Albumin: 2.6 g/dL — ABNORMAL LOW (ref 3.5–5.0)
Alkaline Phosphatase: 47 U/L (ref 38–126)
Anion gap: 8 (ref 5–15)
BUN: 12 mg/dL (ref 8–23)
CO2: 27 mmol/L (ref 22–32)
Calcium: 8.7 mg/dL — ABNORMAL LOW (ref 8.9–10.3)
Chloride: 103 mmol/L (ref 98–111)
Creatinine, Ser: 0.33 mg/dL — ABNORMAL LOW (ref 0.44–1.00)
GFR, Estimated: >60 mL/min (ref >60)
Glucose, Bld: 63 mg/dL — ABNORMAL LOW (ref 70–99)
Potassium: 3.9 mmol/L (ref 3.5–5.1)
Sodium: 138 mmol/L (ref 135–145)
Total Bilirubin: 0.5 mg/dL (ref 0.3–1.2)
Total Protein: 6.7 g/dL (ref 6.5–8.1)

## 2021-02-16 LAB — GLUCOSE, CAPILLARY
Glucose-Capillary: 61 mg/dL — ABNORMAL LOW (ref 70–99)
Glucose-Capillary: 183 mg/dL — ABNORMAL HIGH (ref 70–99)
Glucose-Capillary: 149 mg/dL — ABNORMAL HIGH (ref 70–99)
Glucose-Capillary: 270 mg/dL — ABNORMAL HIGH (ref 70–99)
Glucose-Capillary: 182 mg/dL — ABNORMAL HIGH (ref 70–99)
Glucose-Capillary: 165 mg/dL — ABNORMAL HIGH (ref 70–99)
Glucose-Capillary: 128 mg/dL — ABNORMAL HIGH (ref 70–99)

## 2021-02-16 LAB — CBC
HCT: 38.7 % (ref 36.0–46.0)
Hemoglobin: 12.3 g/dL (ref 12.0–15.0)
MCH: 29.3 pg (ref 26.0–34.0)
MCHC: 31.8 g/dL (ref 30.0–36.0)
MCV: 92.1 fL (ref 80.0–100.0)
Platelets: 235 10*3/uL (ref 150–400)
RBC: 4.2 MIL/uL (ref 3.87–5.11)
RDW: 13.8 % (ref 11.5–15.5)
WBC: 5.5 10*3/uL (ref 4.0–10.5)
nRBC: 0 % (ref 0.0–0.2)

## 2021-02-16 MED ORDER — ASCORBIC ACID 500 MG PO TABS
500.0000 mg | ORAL_TABLET | Freq: Every day | ORAL | Status: DC
  Administered 2021-02-17 – 2021-03-29 (×35): 500 mg
  Filled 2021-02-16 (×36): qty 1

## 2021-02-16 MED ORDER — DEXTROSE 50 % IV SOLN
INTRAVENOUS | Status: AC
  Filled 2021-02-16: qty 50

## 2021-02-16 NOTE — Progress Notes (Signed)
Nutrition Follow-up  DOCUMENTATION CODES:  Severe malnutrition in context of social or environmental circumstances  INTERVENTION:  Continue NPO status with TF as pt is not alert enough to support her nutrition needs orally Recommend continuing enteral feeds via NGT once placement is confirmed. Recommend the following: Osmolite 1.2 @ 81mL/h (1.32L/d) free water q4h Regimen will provide 1584kcal, 73g of protein, and of free water (TF+flush) If family wishes to continue aggressive care, would recommend placement of PEG tube as pt's mentation has not improved despite medical treatment and will likely require long term enteral feeding to maintain nutrition, hydration, and weight. Will not be able to discharge to a facility with NGT in place.  NUTRITION DIAGNOSIS:  Severe Malnutrition related to social / environmental circumstances as evidenced by severe fat depletion, severe muscle depletion.  GOAL:  Patient will meet greater than or equal to 90% of their needs  MONITOR:  TF tolerance, Skin, Labs, I & O's  REASON FOR ASSESSMENT:  Malnutrition Screening Tool    ASSESSMENT:  70 y.o. female with medical history of HTN, DM, schizophrenia, depression, CAD, GERD, and HLD presented to ED from [facility] via EMS for AMS. Pt was found to be quite dehydrated in ED and UA concerning for infection.  Initially admitted to ICU but transferred to floor 9/1. Pt has been with AMS and poor mentation since admission, has not been able to take in POs this admission. SLP evaluated and recommended pt stay NPO. Palliative care consulting and working with family on GOC.   Pt remains minimally interactive. Nonverbal throughout admission. NGT was pulled by pt last night but replaced by nursing staff. XR obtained and NGT in good position in the body of the stomach. TF infusing at goal rate of 58mL/h with good tolerance. Labs WNL and last BM documented yesterday. Pt no longer requiring dextrose  containing IVF. Hypernatremia improved. Noted that vitamin C is ordered PO, will change route to via tube to support wound healing.  Palliative care is working with family on GOC. If aggressive care is desired, would recommend placement of PEG tube as it is unlikely pt will be able to meet her nutrition and hydration needs orally. Long term nutrition support would be beneficial to prevent weight loss and readmission due to dehydration/FTT. Mental status has not improved despite treatment of medical problems. Pt will not be able to return to facility with NGT in place. Given pt's recent decline in functional status and age, it would also be reasonable to move towards a more compassionate/comfort approach to care.   Nutritionally Relevant Medications: Scheduled Meds:  vitamin C  500 mg Oral Daily   free water  100 mL Per Tube Q4H   insulin aspart  0-15 Units Subcutaneous Q4H   insulin glargine-yfgn  14 Units Subcutaneous Daily   Continuous Infusions:  feeding supplement (OSMOLITE 1.2 CAL) 55 mL/hr at 02/14/21 1944   PRN Meds: docusate sodium, ondansetron, polyethylene glycol  Labs Reviewed: SBG ranges from 61-221 mg/dL over the last 24 hours HgbA1c: 7/9% (8/30)  NUTRITION - FOCUSED PHYSICAL EXAM: Flowsheet Row Most Recent Value  Orbital Region Moderate depletion  Upper Arm Region Severe depletion  Thoracic and Lumbar Region Severe depletion  Buccal Region Moderate depletion  Temple Region Moderate depletion  Clavicle Bone Region Moderate depletion  Clavicle and Acromion Bone Region Severe depletion  Scapular Bone Region Severe depletion  Dorsal Hand Moderate depletion  Patellar Region Severe depletion  Anterior Thigh Region Severe depletion  Posterior Calf  Region Moderate depletion  Edema (RD Assessment) None  Hair Reviewed  Eyes Reviewed  Mouth Reviewed  Skin Reviewed  Nails Reviewed   Diet Order:   Diet Order             Diet NPO time specified  Diet effective now                   EDUCATION NEEDS:  No education needs have been identified at this time  Skin:  Skin Assessment: Skin Integrity Issues: Skin Integrity Issues:: Stage II Stage II: sacrum  Last BM:  9/7 - type 4  Height:  Ht Readings from Last 1 Encounters:  02/07/21 5\' 2"  (1.575 m)   Weight:  Wt Readings from Last 1 Encounters:  02/15/21 53.1 kg   Ideal Body Weight:  50 kg  BMI:  Body mass index is 21.41 kg/m.  Estimated Nutritional Needs:  Kcal:  1500-1700 kcal/d Protein:  75-85 g/d Fluid:  1.5-1.8 L/d   04/17/21, RD, LDN Clinical Dietitian Pager on Amion

## 2021-02-16 NOTE — Progress Notes (Addendum)
Daily Progress Note   Patient Name: Erica Mueller       Date: 02/16/2021 DOB: Feb 16, 1951  Age: 70 y.o. MRN#: [id number] Attending Physician: Charise Killian, MD Primary Care Physician: [facility] Admit Date: 02/07/2021  Reason for Consultation/Follow-up: Establishing goals of care  Subjective: Patient is resting in bed and is not responsive to me. Called to speak with son LC. Discussed her status. He states she opened her eyes a few times but went back into a "coma" like state. He understands her poor prognosis. Discussed outlook and scenarios. Discussed pain and suffering. He states he cannot make a decision to withhold any care, and will allow God to make the decisions on her care moving forward. He states he wants any care available to her including CPR, and God will decide on the outcome. Discussed his sister, and her equal ability for decision making. He states they are at odds, and states she will not -or a time will come she will not be available for decision making. Attempted to reach her unsuccessfully.   Full code/full scope.   Length of Stay: 9  Current Medications: Scheduled Meds:   [START ON 02/17/2021] vitamin C  500 mg Per Tube Daily   chlorhexidine  15 mL Mouth Rinse BID   Chlorhexidine Gluconate Cloth  6 each Topical Daily   free water  100 mL Per Tube Q4H   heparin  5,000 Units Subcutaneous Q8H   insulin aspart  0-15 Units Subcutaneous Q4H   insulin glargine-yfgn  14 Units Subcutaneous Daily   mouth rinse  15 mL Mouth Rinse q12n4p    Continuous Infusions:  feeding supplement (OSMOLITE 1.2 CAL) 55 mL/hr at 02/14/21 1944    PRN Meds: acetaminophen, acetaminophen, docusate sodium, morphine injection, ondansetron (ZOFRAN) IV, polyethylene glycol  Physical  Exam Constitutional:      Comments: Eyes closed.   Pulmonary:     Effort: Pulmonary effort is normal.            Vital Signs: BP (!) 162/60 (BP Location: Left Arm)   Pulse 96   Temp 98.7 F (37.1 C)   Resp 14   Ht 5\' 2"  (1.575 m)   Wt 53.1 kg   SpO2 96%   BMI 21.41 kg/m  SpO2: SpO2: 96 % O2 Device: O2 Device: Room  Air O2 Flow Rate: O2 Flow Rate (L/min): 2 L/min  Intake/output summary:  Intake/Output Summary (Last 24 hours) at 02/16/2021 1055 Last data filed at 02/16/2021 0315 Gross per 24 hour  Intake --  Output 700 ml  Net -700 ml   LBM: Last BM Date: 02/15/21 Baseline Weight: Weight: 50 kg Most recent weight: Weight: 53.1 kg        Patient Active Problem List   Diagnosis Date Noted   Protein-calorie malnutrition, severe 02/10/2021   Pressure injury of skin 02/09/2021   Sepsis (HCC) 02/07/2021   Acute metabolic encephalopathy 11/07/2020   Severe sepsis (HCC) 11/07/2020   UTI (urinary tract infection) 11/07/2020   GERD (gastroesophageal reflux disease) 12/20/2017   Hyperlipemia 12/20/2017   History of schizophrenia 07/30/2017   Schizophrenia (HCC) 05/27/2017   Diabetes (HCC) 04/02/2017   Hypertension 04/02/2017   Noncompliance 04/02/2017   Depression 11/17/2013    Palliative Care Assessment & Plan    Recommendations/Plan: Full code/full scope.  Recommend outpatient palliative   Code Status:    Code Status Orders  (From admission, onward)           Start     Ordered   02/07/21 1806  Full code  Continuous        02/07/21 1809           Code Status History     Date Active Date Inactive Code Status Order ID Comments User Context   11/07/2020 2347 11/11/2020 2103 Full Code [id number]  Andris Baumann, MD ED   10/16/2017 0053 10/24/2017 1726 Full Code [id number]  Audery Amel, MD Inpatient   05/27/2017 0024 05/30/2017 1929 Full Code [id number]  Clapacs, Jackquline Denmark, MD Inpatient       Prognosis:  < 6 months   Care plan was discussed with  [facility]  Thank you for allowing the Palliative Medicine Team to assist in the care of this patient.       Total Time 35 min Prolonged Time Billed  no       Greater than 50%  of this time was spent counseling and coordinating care related to the above assessment and plan.  Morton Stall, NP  Please contact Palliative Medicine Team phone at [phone] for questions and concerns.

## 2021-02-16 NOTE — Progress Notes (Signed)
Inpatient Diabetes Program Recommendations  AACE/ADA: New Consensus Statement on Inpatient Glycemic Control   Target Ranges:  Prepandial:   less than 140 mg/dL      Peak postprandial:   less than 180 mg/dL (1-2 hours)      Critically ill patients:  140 - 180 mg/dL  Results for Erica Mueller, Erica Mueller (MRN [id number]) as of 02/16/2021 08:08  Ref. Range 02/16/2021 04:17 02/16/2021 05:42 02/16/2021 07:54  Glucose-Capillary Latest Ref Range: 70 - 99 mg/dL 61 (L) 876 (H) 811 (H)   Results for Erica Mueller, POLIZZI (MRN [id number]) as of 02/16/2021 08:08  Ref. Range 02/15/2021 07:25 02/15/2021 11:06 02/15/2021 11:25 02/15/2021 14:44 02/15/2021 20:44 02/15/2021 23:44  Glucose-Capillary Latest Ref Range: 70 - 99 mg/dL 974 (H) 163 (H) 845 (H) 190 (H) 199 (H) 221 (H)   Review of Glycemic Control  Current orders for Inpatient glycemic control: Semglee 14 units daily, Novolog 0-15 units Q4H; Osmolite @ 55 ml/hr  Inpatient Diabetes Program Recommendations:    Insulin: Please decrease Novolog correction to 0-9 units Q4H and order Novolog 3 units Q4H for tube feeding coverage. If tube feeding is stopped or held then Novolog tube feeding coverage should also be stopped or held.  Thanks, Orlando Penner, RN, MSN, CDE Diabetes Coordinator Inpatient Diabetes Program [phone] (Team Pager from 8am to 5pm)

## 2021-02-16 NOTE — Progress Notes (Signed)
Pt's daughter Jocelyne Reinertsen called reporting she had gotten a call regarding her mother. She is requesting to be removed from pt's contact list and asks to not be contacted regarding anything.

## 2021-02-16 NOTE — Progress Notes (Signed)
SLP Cancellation Note  Patient Details Name: MAKIAH FOYE MRN: [id number] DOB: 12-18-1950   Cancelled treatment:       Reason Eval/Treat Not Completed: Patient not medically ready;Medical issues which prohibited therapy;Fatigue/lethargy limiting ability to participate (chart reviewed; consulted NSG re: pt's status). Pt continues w/ NGT in place for TFs for nutrition. Pt also continues to present w/ poor responsiveness and engagement other than to open her eyes briefly then close them again. This is not a presentation of someone who is ready to take on the demand of oral eating/drinking -- not only would she be at HIGH risk of being unable to meet her nutrition/hydration needs orally, she would also be at increased risk for dysphagia and aspiration.  Noted Palliative Care's involvement and discussions w/ Son. He is requesting FULL CODE status/care at this time. Recommend continuing w/ current feeding method and npo except for ice chips post oral care when fully alert/awake. Would not recommend an oral diet until pt can demonstrate participation/engagement w/ an alert State for tasks including po tasks.  ST services will s/o at this time and can be re-consulted when appropriate for dysphagia tx. Recommend continuing frequent oral care for hygiene and stimulation of swallowing; single ice chips post oral care only if fully awake/alert. NSG updated.      Jerilynn Som, MS, CCC-SLP Speech Language Pathologist Rehab Services [phone] [facility] 02/16/2021, 5:47 PM

## 2021-02-16 NOTE — TOC Progression Note (Signed)
Transition of Care [facility]) - Progression Note    Patient Details  Name: Erica Mueller MRN: [id number] Date of Birth: 1950/10/20  [facility]) CM/SW Contact  Barrie Dunker, RN Phone Number: 02/16/2021, 3:46 PM  Clinical Narrative:     The patient is a fulltime long term resident at [facility], she will return to [facility] at [place], will continue to monitor for needs  Expected Discharge Plan: Long Term Nursing Home Barriers to Discharge: Continued Medical Work up  Expected Discharge Plan and Services Expected Discharge Plan: Long Term Nursing Home In-house Referral: Clinical Social Work   Post Acute Care Choice: Nursing Home Living arrangements for the past 2 months:  [facility] cabin [facility] Health Care / Long Term Care Facility)                                       Social Determinants of Health (SDOH) Interventions    Readmission Risk Interventions No flowsheet data found.

## 2021-02-16 NOTE — Progress Notes (Signed)
PROGRESS NOTE    Erica Mueller  JHE:[id number] DOB: Jun 17, 1950 DOA: 02/07/2021 PCP: [facility]   Assessment & Plan:   Active Problems:   Sepsis ([place])   Pressure injury of skin   Protein-calorie malnutrition, severe  Failure to thrive: secondary to all below. Poor prognosis but pt's son wants to continue care and to remain full code  Severe sepsis: likely due to aspiration pneumonia. Met criteria w/ tachycardia, tachypnea, encephalopathy, & pneumonia. Initially thought to be UTI, but culture negative, further chest imaging showed consolidation in RUL/RLL consistent with aspiration.  Completed abx course. Resolved   Aspiration pneumonia: completed abx course. Continue on bronchodilators and encourage incentive spirometry    Hypernatremia: resolved    Hypokalemia: within normal limits today    Hypophosphatemia: WNL   Transaminitis: etiology unclear, labile. Possibly secondary to COVID19 vs congestive hepatopathy vs resolving ischemic injury due to sepsis  COVID19 infection: diagnosed 8/18, had no symptoms.  Well outside isolation period.   Acute on chronic metabolic encephalopathy: unchanged from day prior. Dependent on feeding tube for fluids & nutrition. Will likely remain bedbound, nonverbal, does not follow commands   Cardiomyopathy: w/ elevated tropoins. Echo 8/30 showed reduced EF, new finding, without symptoms of CHF.  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 Unlikely a candidate for any invasive treatment/tests   Dysphagia: continue w/ temporary NG tube & tube feeds. Palliative care following   DM2: likely poorly controlled. Continue on glargine, SSI w/ accuchecks    CAD: continue on BB, statin   HLD: continue on statin    HTN: continue on BB   Schizophrenia: continue to hold amitriptyline, thorazine, thiothixine and invega until mentation better. Possible dementia    Nonsustained VT: asymptomatic, hemodynamically stable.    AKI: resolved    Stage II pressure injury sacrum: present on admission. Continue w/ wound care    Thrombocytopenia: resolved    Severe protein calorie malnutrition: continue w/ tube feeds   DVT prophylaxis: heparin  Code Status:  full  Family Communication: called pt's son, no answer & unable to leave a voicemail  Disposition Plan:  unclear.  Level of care: Med-Surg \ Status is: Inpatient  Remains inpatient appropriate because:Altered mental status, Unsafe d/c plan, IV treatments appropriate due to intensity of illness or inability to take PO, and Inpatient level of care appropriate due to severity of illness  Dispo: The patient is from: Home              Anticipated d/c is to:  unclear              Patient currently is medically unstable for d/c   Difficult to place patient : unclear   Consultants:  Palliative care  Procedures: (  Antimicrobials:   Subjective: Pt does not verbalize anything and intermittently opens her eyes  Objective: Vitals:   02/15/21 1443 02/15/21 2019 02/15/21 2338 02/16/21 0414  BP: (!) 165/59 (!) 157/62 (!) 167/57 (!) 173/63  Pulse: 85 82 82 88  Resp: 15 16 16 16   Temp: (!) 97 F (36.1 C) (!) 97.5 F (36.4 C) 97.8 F (36.6 C) (!) 97.5 F (36.4 C)  TempSrc:  Oral Oral Oral  SpO2: 98% 100% 100% 94%  Weight:      Height:        Intake/Output Summary (Last 24 hours) at 02/16/2021 0758 Last data filed at 02/16/2021 0315 Gross per 24 hour  Intake --  Output 900 ml  Net -900 ml   Danley Danker  Weights   02/11/21 0500 02/14/21 0500 02/15/21 0415  Weight: 51.4 kg 53.5 kg 53.1 kg    Examination:  General exam: Appears clam. Poor dentition  Respiratory system: Clear breath sounds b/l. Cardiovascular system: S1/S2+. No rubs or clicks   Gastrointestinal system: Abd is soft, NT, ND & hypoactive bowel sounds Central nervous system: Awake. Moves all extremities  Psychiatry: Judgement and insight appear abnormal. Flat mood and affect    Data Reviewed: I have  personally reviewed following labs and imaging studies  CBC: Recent Labs  Lab 02/10/21 0930 02/13/21 0629 02/16/21 0433  WBC 6.8 6.5 5.5  HGB 12.4 10.9* 12.3  HCT 39.5 33.8* 38.7  MCV 96.1 93.9 92.1  PLT 112* 169 944   Basic Metabolic Panel: Recent Labs  Lab 02/10/21 0930 02/11/21 0601 02/12/21 0631 02/13/21 0629 02/14/21 0412 02/15/21 0406 02/16/21 0433  NA 144 144 142 140 139 139 138  K 3.5 3.1* 4.1 3.9 4.9 4.5 3.9  CL 111 108 108 107 107 107 103  CO2 28 29 29 29 27 27 27   GLUCOSE 188* 221* 235* 138* 267* 218* 63*  BUN 18 14 16 15 16 14 12   CREATININE 0.59 0.68 0.68 0.51 0.54 0.44 0.33*  CALCIUM 7.8* 8.1* 8.2* 8.1* 8.0* 8.3* 8.7*  MG 2.1 2.0 2.0 2.0 2.0  --   --   PHOS 2.5 2.3* 2.7 2.4* 3.4  --   --    GFR: Estimated Creatinine Clearance: 51.8 mL/min (A) (by C-G formula based on SCr of 0.33 mg/dL (L)). Liver Function Tests: Recent Labs  Lab 02/11/21 0601 02/12/21 0631 02/14/21 0412 02/15/21 0406 02/16/21 0433  AST 50* 65* 47* 47* 55*  ALT 26 50* 51* 50* 60*  ALKPHOS 48 53 45 47 47  BILITOT 0.5 0.3 0.3 0.3 0.5  PROT 5.7* 5.8* 5.1* 5.5* 6.7  ALBUMIN 2.1* 2.2* 2.0* 2.1* 2.6*   No results for input(s): LIPASE, AMYLASE in the last 168 hours. No results for input(s): AMMONIA in the last 168 hours. Coagulation Profile: No results for input(s): INR, PROTIME in the last 168 hours. Cardiac Enzymes: No results for input(s): CKTOTAL, CKMB, CKMBINDEX, TROPONINI in the last 168 hours. BNP (last 3 results) No results for input(s): PROBNP in the last 8760 hours. HbA1C: No results for input(s): HGBA1C in the last 72 hours. CBG: Recent Labs  Lab 02/15/21 2044 02/15/21 2344 02/16/21 0417 02/16/21 0542 02/16/21 0754  GLUCAP 199* 221* 61* 183* 149*   Lipid Profile: No results for input(s): CHOL, HDL, LDLCALC, TRIG, CHOLHDL, LDLDIRECT in the last 72 hours. Thyroid Function Tests: No results for input(s): TSH, T4TOTAL, FREET4, T3FREE, THYROIDAB in the last 72  hours. Anemia Panel: No results for input(s): VITAMINB12, FOLATE, FERRITIN, TIBC, IRON, RETICCTPCT in the last 72 hours. Sepsis Labs: No results for input(s): PROCALCITON, LATICACIDVEN in the last 168 hours.   Recent Results (from the past 240 hour(s))  Resp Panel by RT-PCR (Flu A&B, Covid) Nasopharyngeal Swab     Status: Abnormal   Collection Time: 02/07/21  3:16 PM   Specimen: Nasopharyngeal Swab; Nasopharyngeal(NP) swabs in vial transport medium  Result Value Ref Range Status   SARS Coronavirus 2 by RT PCR POSITIVE (A) NEGATIVE Final    Comment: RESULT CALLED TO, READ BACK BY AND VERIFIED WITH: MATT BASSETT 02/07/21 1644 AMK (NOTE) SARS-CoV-2 target nucleic acids are DETECTED.  The SARS-CoV-2 RNA is generally detectable in upper respiratory specimens during the acute phase of infection. Positive results are indicative of the presence  of the identified virus, but do not rule out bacterial infection or co-infection with other pathogens not detected by the test. Clinical correlation with patient history and other diagnostic information is necessary to determine patient infection status. The expected result is Negative.  Fact Sheet for Patients: EntrepreneurPulse.com.au  Fact Sheet for Healthcare Providers: IncredibleEmployment.be  This test is not yet approved or cleared by the [place] FDA and  has been authorized for detection and/or diagnosis of SARS-CoV-2 by FDA under an Emergency Use Authorization (EUA).  This EUA will remain in effect (meaning this test can be use d) for the duration of  the COVID-19 declaration under Section 564(b)(1) of the Act, 21 U.S.C. section 360bbb-3(b)(1), unless the authorization is terminated or revoked sooner.     Influenza A by PCR NEGATIVE NEGATIVE Final   Influenza B by PCR NEGATIVE NEGATIVE Final    Comment: (NOTE) The Xpert Xpress SARS-CoV-2/FLU/RSV plus assay is intended as an aid in the  diagnosis of influenza from Nasopharyngeal swab specimens and should not be used as a sole basis for treatment. Nasal washings and aspirates are unacceptable for Xpert Xpress SARS-CoV-2/FLU/RSV testing.  Fact Sheet for Patients: EntrepreneurPulse.com.au  Fact Sheet for Healthcare Providers: IncredibleEmployment.be  This test is not yet approved or cleared by the [place] FDA and has been authorized for detection and/or diagnosis of SARS-CoV-2 by FDA under an Emergency Use Authorization (EUA). This EUA will remain in effect (meaning this test can be used) for the duration of the COVID-19 declaration under Section 564(b)(1) of the Act, 21 U.S.C. section 360bbb-3(b)(1), unless the authorization is terminated or revoked.  Performed at [facility], [address] [place], [place] [address]   Blood culture (routine single)     Status: None   Collection Time: 02/07/21  3:37 PM   Specimen: BLOOD  Result Value Ref Range Status   Specimen Description BLOOD RIGHT ANTECUBITAL  Final   Special Requests   Final    BOTTLES DRAWN AEROBIC AND ANAEROBIC Blood Culture adequate volume   Culture   Final    NO GROWTH 7 DAYS Performed at [facility], [address], [place], [place] [address]    Report Status 02/14/2021 FINAL  Final  Culture, blood (single)     Status: None   Collection Time: 02/07/21  3:50 PM   Specimen: BLOOD  Result Value Ref Range Status   Specimen Description BLOOD LEFT ASSIST CONTROL  Final   Special Requests   Final    BOTTLES DRAWN AEROBIC AND ANAEROBIC Blood Culture adequate volume   Culture   Final    NO GROWTH 7 DAYS Performed at [facility], [address] [place], [place] [address]    Report Status 02/14/2021 FINAL  Final  Urine Culture     Status: None   Collection Time: 02/07/21  4:50 PM   Specimen: In/Out Cath Urine  Result Value Ref Range Status   Specimen Description   Final     IN/OUT CATH URINE Performed at [facility], [address], [place], [place] [address]    Special Requests   Final    NONE Performed at [facility], [address], [place], [place] [address]    Culture   Final    NO GROWTH Performed at [facility], [place] [address] [place], [place] [address]    Report Status 02/09/2021 FINAL  Final  MRSA Next Gen by PCR, Nasal     Status: None  Collection Time: 02/09/21  9:47 AM   Specimen: Nasal Mucosa; Nasal Swab  Result Value Ref Range Status   MRSA by PCR Next Gen NOT DETECTED NOT DETECTED Final    Comment: (NOTE) The GeneXpert MRSA Assay (FDA approved for NASAL specimens only), is one component of a comprehensive MRSA colonization surveillance program. It is not intended to diagnose MRSA infection nor to guide or monitor treatment for MRSA infections. Test performance is not FDA approved in patients less than 19 years old. Performed at [facility], [address], [place], [place] [address]          Radiology Studies: DG Abd 1 View  Result Date: 02/16/2021 CLINICAL DATA:  NG tube placement. EXAM: ABDOMEN - 1 VIEW COMPARISON:  02/16/2021 FINDINGS: The NG tube tip is in the body region of the stomach. The lung bases are grossly clear. IMPRESSION: NG tube tip is in the body region of the stomach. Electronically Signed   By: Marijo Sanes M.D.   On: 02/16/2021 06:15   DG Abd 1 View  Result Date: 02/16/2021 CLINICAL DATA:  70 year old female feeding tube placement. EXAM: ABDOMEN - 1 VIEW COMPARISON:  02/10/2021. FINDINGS: AP view of the abdomen at 0408 hours. Enteric tube tip projects inside the stomach. The side hole is just above the GEJ. Stable lung bases. Visible bowel-gas pattern within normal limits. IMPRESSION: Enteric tube tip in the stomach with side hole just above the GEJ - advance 6 cm to ensure side hole placement within the stomach. Electronically Signed   By: Genevie Ann M.D.   On: 02/16/2021  04:53        Scheduled Meds:  vitamin C  500 mg Oral Daily   chlorhexidine  15 mL Mouth Rinse BID   Chlorhexidine Gluconate Cloth  6 each Topical Daily   free water  100 mL Per Tube Q4H   heparin  5,000 Units Subcutaneous Q8H   insulin aspart  0-15 Units Subcutaneous Q4H   insulin glargine-yfgn  14 Units Subcutaneous Daily   mouth rinse  15 mL Mouth Rinse q12n4p   Continuous Infusions:  feeding supplement (OSMOLITE 1.2 CAL) 55 mL/hr at 02/14/21 1944     LOS: 9 days    Time spent: 25 mins     Wyvonnia Dusky, MD [facility] Pager [phone]  If 7PM-7AM, please contact night-coverage 02/16/2021, 7:58 AM

## 2021-02-16 NOTE — Progress Notes (Signed)
Pulled NG tube out. OK to reinsert per Manuela Schwartz, NP

## 2021-02-17 ENCOUNTER — Inpatient Hospital Stay: Payer: Medicare Other

## 2021-02-17 DIAGNOSIS — I428 Other cardiomyopathies: Secondary | ICD-10-CM | POA: Diagnosis not present

## 2021-02-17 DIAGNOSIS — G9341 Metabolic encephalopathy: Secondary | ICD-10-CM | POA: Diagnosis not present

## 2021-02-17 DIAGNOSIS — R1319 Other dysphagia: Secondary | ICD-10-CM | POA: Diagnosis not present

## 2021-02-17 LAB — COMPREHENSIVE METABOLIC PANEL
ALT: 45 U/L — ABNORMAL HIGH (ref 0–44)
AST: 29 U/L (ref 15–41)
Albumin: 2.4 g/dL — ABNORMAL LOW (ref 3.5–5.0)
Alkaline Phosphatase: 49 U/L (ref 38–126)
Anion gap: 7 (ref 5–15)
BUN: 13 mg/dL (ref 8–23)
CO2: 28 mmol/L (ref 22–32)
Calcium: 8.8 mg/dL — ABNORMAL LOW (ref 8.9–10.3)
Chloride: 99 mmol/L (ref 98–111)
Creatinine, Ser: 0.44 mg/dL (ref 0.44–1.00)
GFR, Estimated: >60 mL/min (ref >60)
Glucose, Bld: 197 mg/dL — ABNORMAL HIGH (ref 70–99)
Potassium: 4.4 mmol/L (ref 3.5–5.1)
Sodium: 134 mmol/L — ABNORMAL LOW (ref 135–145)
Total Bilirubin: 0.4 mg/dL (ref 0.3–1.2)
Total Protein: 6.2 g/dL — ABNORMAL LOW (ref 6.5–8.1)

## 2021-02-17 LAB — GLUCOSE, CAPILLARY
Glucose-Capillary: 165 mg/dL — ABNORMAL HIGH (ref 70–99)
Glucose-Capillary: 233 mg/dL — ABNORMAL HIGH (ref 70–99)
Glucose-Capillary: 170 mg/dL — ABNORMAL HIGH (ref 70–99)
Glucose-Capillary: 137 mg/dL — ABNORMAL HIGH (ref 70–99)
Glucose-Capillary: 114 mg/dL — ABNORMAL HIGH (ref 70–99)

## 2021-02-17 LAB — CBC
HCT: 34.5 % — ABNORMAL LOW (ref 36.0–46.0)
Hemoglobin: 11 g/dL — ABNORMAL LOW (ref 12.0–15.0)
MCH: 29.3 pg (ref 26.0–34.0)
MCHC: 31.9 g/dL (ref 30.0–36.0)
MCV: 91.8 fL (ref 80.0–100.0)
Platelets: 235 10*3/uL (ref 150–400)
RBC: 3.76 MIL/uL — ABNORMAL LOW (ref 3.87–5.11)
RDW: 14.3 % (ref 11.5–15.5)
WBC: 6.8 10*3/uL (ref 4.0–10.5)
nRBC: 0 % (ref 0.0–0.2)

## 2021-02-17 MED ORDER — METOPROLOL TARTRATE 25 MG PO TABS
25.0000 mg | ORAL_TABLET | Freq: Two times a day (BID) | ORAL | Status: DC
  Administered 2021-02-17 – 2021-02-23 (×13): 25 mg via NASOGASTRIC
  Filled 2021-02-17 (×13): qty 1

## 2021-02-17 MED ORDER — PRAVASTATIN SODIUM 20 MG PO TABS
40.0000 mg | ORAL_TABLET | Freq: Every day | ORAL | Status: DC
  Administered 2021-02-17 – 2021-02-21 (×5): 40 mg via NASOGASTRIC
  Filled 2021-02-17 (×5): qty 2

## 2021-02-17 NOTE — Plan of Care (Signed)
  Problem: Safety: Goal: Ability to remain free from injury will improve Outcome: Progressing   Problem: Clinical Measurements: Goal: Will remain free from infection Outcome: Progressing   Problem: Safety: Goal: Ability to remain free from injury will improve Outcome: Progressing

## 2021-02-17 NOTE — Progress Notes (Signed)
PROGRESS NOTE    Erica Mueller  TOI:[id number] DOB: April 16, 1951 DOA: 02/07/2021 PCP: [facility]   Assessment & Plan:   Active Problems:   Sepsis ([place])   Pressure injury of skin   Protein-calorie malnutrition, severe  Failure to thrive: secondary to all below. Poor prognosis but pt's son wants to continue care and to remain full code  Severe sepsis: likely due to aspiration pneumonia. Met criteria w/ tachycardia, tachypnea, encephalopathy, & pneumonia. Initially thought to be UTI, but culture negative, further chest imaging showed consolidation in RUL/RLL consistent with aspiration.  Completed abx course. Resolved   Aspiration pneumonia: completed abx course. Continue on bronchodilators and encourage incentive spirometry     Hypernatremia: resolved    Hypokalemia: WNL    Hypophosphatemia: WNL   Transaminitis: etiology unclear, labile. AST is WNL and ALT is elevated still but trending done. Possibly secondary to COVID19 vs congestive hepatopathy vs resolving ischemic injury   COVID19 infection: diagnosed 8/18, had no symptoms.  Well outside isolation period.   Acute on chronic metabolic encephalopathy: remains unchanged. Dependent on feeding tube for fluids & nutrition. Will likely remain bedbound, nonverbal, does not follow commands   Cardiomyopathy: w/ elevated tropoins. Echo 8/30 showed reduced EF, new finding, without symptoms of CHF.  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 Unlikely a candidate for any invasive treatment/tests   Dysphagia: continue w/ NG tube and tube feeds. Discussed PEG tube w/ pt's son and he wants the pt to have if she unable to eat/drink herself.   DM2: likely poorly controlled. Continue on glargine, SSI w/ accuchecks    CAD: continue on metoprolol, statin   HLD: continue on statin   HTN: continue on metoprolol   Schizophrenia: continue to hold amitriptyline, thorazine, thiothixine and invega until mentation better. Possible  dementia    Nonsustained VT: asymptomatic, hemodynamically stable.    AKI: resolved   Stage II pressure injury sacrum: present on admission. Continue w/ wound care    Thrombocytopenia: resolved    Severe protein calorie malnutrition: continue w/ tube feeds. Will likely require PEG tube    DVT prophylaxis: heparin  Code Status:  full  Family Communication: discussed pt's care w/ pt's son, [place], and answered his questions  Disposition Plan:  unclear.  Level of care: Med-Surg \ Status is: Inpatient  Remains inpatient appropriate because:Altered mental status, Unsafe d/c plan, IV treatments appropriate due to intensity of illness or inability to take PO, and Inpatient level of care appropriate due to severity of illness  Dispo: The patient is from: Home              Anticipated d/c is to:  unclear              Patient currently is medically unstable for d/c   Difficult to place patient : unclear   Consultants:  Palliative care  Procedures: (  Antimicrobials:   Subjective: Pt is nonverbal   Objective: Vitals:   02/17/21 0014 02/17/21 0332 02/17/21 0656 02/17/21 0758  BP: (!) 163/56 (!) 168/58  (!) 148/55  Pulse: 92 86  91  Resp: 14 20  16   Temp: 98.4 F (36.9 C) 97.9 F (36.6 C)  98.3 F (36.8 C)  TempSrc:  Oral  Axillary  SpO2: 96%   98%  Weight:   50.3 kg   Height:        Intake/Output Summary (Last 24 hours) at 02/17/2021 0813 Last data filed at 02/17/2021 8338 Gross per 24 hour  Intake 440 ml  Output 825 ml  Net -385 ml   Filed Weights   02/14/21 0500 02/15/21 0415 02/17/21 0656  Weight: 53.5 kg 53.1 kg 50.3 kg    Examination:  General exam: Appears comfortable. Frail appearing  Respiratory system: Clear breath sounds b/l. Cardiovascular system: S1 & S2+. No rubs or clicks  Gastrointestinal system: Abd is soft, NT, ND & normal bowel sounds Central nervous system: Awake. Moves all extremities  Psychiatry: Judgement and insight appear abnormal. Flat  mood and affect    Data Reviewed: I have personally reviewed following labs and imaging studies  CBC: Recent Labs  Lab 02/10/21 0930 02/13/21 0629 02/16/21 0433 02/17/21 0431  WBC 6.8 6.5 5.5 6.8  HGB 12.4 10.9* 12.3 11.0*  HCT 39.5 33.8* 38.7 34.5*  MCV 96.1 93.9 92.1 91.8  PLT 112* 169 235 761   Basic Metabolic Panel: Recent Labs  Lab 02/10/21 0930 02/11/21 0601 02/12/21 0631 02/13/21 0629 02/14/21 0412 02/15/21 0406 02/16/21 0433 02/17/21 0431  NA 144 144 142 140 139 139 138 134*  K 3.5 3.1* 4.1 3.9 4.9 4.5 3.9 4.4  CL 111 108 108 107 107 107 103 99  CO2 28 29 29 29 27 27 27 28   GLUCOSE 188* 221* 235* 138* 267* 218* 63* 197*  BUN 18 14 16 15 16 14 12 13   CREATININE 0.59 0.68 0.68 0.51 0.54 0.44 0.33* 0.44  CALCIUM 7.8* 8.1* 8.2* 8.1* 8.0* 8.3* 8.7* 8.8*  MG 2.1 2.0 2.0 2.0 2.0  --   --   --   PHOS 2.5 2.3* 2.7 2.4* 3.4  --   --   --    GFR: Estimated Creatinine Clearance: 51.8 mL/min (by C-G formula based on SCr of 0.44 mg/dL). Liver Function Tests: Recent Labs  Lab 02/12/21 0631 02/14/21 0412 02/15/21 0406 02/16/21 0433 02/17/21 0431  AST 65* 47* 47* 55* 29  ALT 50* 51* 50* 60* 45*  ALKPHOS 53 45 47 47 49  BILITOT 0.3 0.3 0.3 0.5 0.4  PROT 5.8* 5.1* 5.5* 6.7 6.2*  ALBUMIN 2.2* 2.0* 2.1* 2.6* 2.4*   No results for input(s): LIPASE, AMYLASE in the last 168 hours. No results for input(s): AMMONIA in the last 168 hours. Coagulation Profile: No results for input(s): INR, PROTIME in the last 168 hours. Cardiac Enzymes: No results for input(s): CKTOTAL, CKMB, CKMBINDEX, TROPONINI in the last 168 hours. BNP (last 3 results) No results for input(s): PROBNP in the last 8760 hours. HbA1C: No results for input(s): HGBA1C in the last 72 hours. CBG: Recent Labs  Lab 02/16/21 1649 02/16/21 2037 02/16/21 2315 02/17/21 0341 02/17/21 0758  GLUCAP 182* 165* 128* 165* 233*   Lipid Profile: No results for input(s): CHOL, HDL, LDLCALC, TRIG, CHOLHDL,  LDLDIRECT in the last 72 hours. Thyroid Function Tests: No results for input(s): TSH, T4TOTAL, FREET4, T3FREE, THYROIDAB in the last 72 hours. Anemia Panel: No results for input(s): VITAMINB12, FOLATE, FERRITIN, TIBC, IRON, RETICCTPCT in the last 72 hours. Sepsis Labs: No results for input(s): PROCALCITON, LATICACIDVEN in the last 168 hours.   Recent Results (from the past 240 hour(s))  Resp Panel by RT-PCR (Flu A&B, Covid) Nasopharyngeal Swab     Status: Abnormal   Collection Time: 02/07/21  3:16 PM   Specimen: Nasopharyngeal Swab; Nasopharyngeal(NP) swabs in vial transport medium  Result Value Ref Range Status   SARS Coronavirus 2 by RT PCR POSITIVE (A) NEGATIVE Final    Comment: RESULT CALLED TO, READ BACK BY AND VERIFIED WITH:  MATT BASSETT 02/07/21 1644 AMK (NOTE) SARS-CoV-2 target nucleic acids are DETECTED.  The SARS-CoV-2 RNA is generally detectable in upper respiratory specimens during the acute phase of infection. Positive results are indicative of the presence of the identified virus, but do not rule out bacterial infection or co-infection with other pathogens not detected by the test. Clinical correlation with patient history and other diagnostic information is necessary to determine patient infection status. The expected result is Negative.  Fact Sheet for Patients: EntrepreneurPulse.com.au  Fact Sheet for Healthcare Providers: IncredibleEmployment.be  This test is not yet approved or cleared by the [place] FDA and  has been authorized for detection and/or diagnosis of SARS-CoV-2 by FDA under an Emergency Use Authorization (EUA).  This EUA will remain in effect (meaning this test can be use d) for the duration of  the COVID-19 declaration under Section 564(b)(1) of the Act, 21 U.S.C. section 360bbb-3(b)(1), unless the authorization is terminated or revoked sooner.     Influenza A by PCR NEGATIVE NEGATIVE Final   Influenza  B by PCR NEGATIVE NEGATIVE Final    Comment: (NOTE) The Xpert Xpress SARS-CoV-2/FLU/RSV plus assay is intended as an aid in the diagnosis of influenza from Nasopharyngeal swab specimens and should not be used as a sole basis for treatment. Nasal washings and aspirates are unacceptable for Xpert Xpress SARS-CoV-2/FLU/RSV testing.  Fact Sheet for Patients: EntrepreneurPulse.com.au  Fact Sheet for Healthcare Providers: IncredibleEmployment.be  This test is not yet approved or cleared by the [place] FDA and has been authorized for detection and/or diagnosis of SARS-CoV-2 by FDA under an Emergency Use Authorization (EUA). This EUA will remain in effect (meaning this test can be used) for the duration of the COVID-19 declaration under Section 564(b)(1) of the Act, 21 U.S.C. section 360bbb-3(b)(1), unless the authorization is terminated or revoked.  Performed at [facility], [address] [place], [place] [address]   Blood culture (routine single)     Status: None   Collection Time: 02/07/21  3:37 PM   Specimen: BLOOD  Result Value Ref Range Status   Specimen Description BLOOD RIGHT ANTECUBITAL  Final   Special Requests   Final    BOTTLES DRAWN AEROBIC AND ANAEROBIC Blood Culture adequate volume   Culture   Final    NO GROWTH 7 DAYS Performed at [facility], [address], [place], [place] [address]    Report Status 02/14/2021 FINAL  Final  Culture, blood (single)     Status: None   Collection Time: 02/07/21  3:50 PM   Specimen: BLOOD  Result Value Ref Range Status   Specimen Description BLOOD LEFT ASSIST CONTROL  Final   Special Requests   Final    BOTTLES DRAWN AEROBIC AND ANAEROBIC Blood Culture adequate volume   Culture   Final    NO GROWTH 7 DAYS Performed at [facility], [address] [place], [place] [address]    Report Status 02/14/2021 FINAL  Final  Urine Culture     Status: None    Collection Time: 02/07/21  4:50 PM   Specimen: In/Out Cath Urine  Result Value Ref Range Status   Specimen Description   Final    IN/OUT CATH URINE Performed at [facility], [address] [place], [place] [address]    Special Requests   Final    NONE Performed at [facility], [address] [place],  [address]    Culture   Final    NO GROWTH  Performed at [facility], [place] [address] [place], [place] [address]    Report Status 02/09/2021 FINAL  Final  MRSA Next Gen by PCR, Nasal     Status: None   Collection Time: 02/09/21  9:47 AM   Specimen: Nasal Mucosa; Nasal Swab  Result Value Ref Range Status   MRSA by PCR Next Gen NOT DETECTED NOT DETECTED Final    Comment: (NOTE) The GeneXpert MRSA Assay (FDA approved for NASAL specimens only), is one component of a comprehensive MRSA colonization surveillance program. It is not intended to diagnose MRSA infection nor to guide or monitor treatment for MRSA infections. Test performance is not FDA approved in patients less than 21 years old. Performed at [facility], [address], [place], [place] [address]          Radiology Studies: DG Abd 1 View  Result Date: 02/16/2021 CLINICAL DATA:  NG tube placement. EXAM: ABDOMEN - 1 VIEW COMPARISON:  02/16/2021 FINDINGS: The NG tube tip is in the body region of the stomach. The lung bases are grossly clear. IMPRESSION: NG tube tip is in the body region of the stomach. Electronically Signed   By: Marijo Sanes M.D.   On: 02/16/2021 06:15   DG Abd 1 View  Result Date: 02/16/2021 CLINICAL DATA:  70 year old female feeding tube placement. EXAM: ABDOMEN - 1 VIEW COMPARISON:  02/10/2021. FINDINGS: AP view of the abdomen at 0408 hours. Enteric tube tip projects inside the stomach. The side hole is just above the GEJ. Stable lung bases. Visible bowel-gas pattern within normal limits. IMPRESSION: Enteric tube tip in the stomach with side hole just above  the GEJ - advance 6 cm to ensure side hole placement within the stomach. Electronically Signed   By: Genevie Ann M.D.   On: 02/16/2021 04:53        Scheduled Meds:  vitamin C  500 mg Per Tube Daily   chlorhexidine  15 mL Mouth Rinse BID   Chlorhexidine Gluconate Cloth  6 each Topical Daily   free water  100 mL Per Tube Q4H   heparin  5,000 Units Subcutaneous Q8H   insulin aspart  0-15 Units Subcutaneous Q4H   insulin glargine-yfgn  14 Units Subcutaneous Daily   mouth rinse  15 mL Mouth Rinse q12n4p   Continuous Infusions:  feeding supplement (OSMOLITE 1.2 CAL) 1,000 mL (02/16/21 2211)     LOS: 10 days    Time spent: 20 mins     Wyvonnia Dusky, MD [facility] Pager [phone]  If 7PM-7AM, please contact night-coverage 02/17/2021, 8:13 AM

## 2021-02-17 NOTE — Progress Notes (Signed)
I concur with VS done by SN

## 2021-02-17 NOTE — Plan of Care (Signed)
  Problem: Education: Goal: Knowledge of General Education information will improve Description: Including pain rating scale, medication(s)/side effects and non-pharmacologic comfort measures Outcome: Not Progressing   Problem: Health Behavior/Discharge Planning: Goal: Ability to manage health-related needs will improve Outcome: Not Progressing   Problem: Clinical Measurements: Goal: Ability to maintain clinical measurements within normal limits will improve Outcome: Not Progressing Goal: Will remain free from infection Outcome: Not Progressing Goal: Diagnostic test results will improve Outcome: Not Progressing Goal: Respiratory complications will improve Outcome: Not Progressing Goal: Cardiovascular complication will be avoided Outcome: Not Progressing   Problem: Nutrition: Goal: Adequate nutrition will be maintained Outcome: Not Progressing   Problem: Elimination: Goal: Will not experience complications related to bowel motility Outcome: Not Progressing Goal: Will not experience complications related to urinary retention Outcome: Not Progressing

## 2021-02-18 DIAGNOSIS — R1319 Other dysphagia: Secondary | ICD-10-CM | POA: Diagnosis not present

## 2021-02-18 DIAGNOSIS — I428 Other cardiomyopathies: Secondary | ICD-10-CM | POA: Diagnosis not present

## 2021-02-18 DIAGNOSIS — G9341 Metabolic encephalopathy: Secondary | ICD-10-CM | POA: Diagnosis not present

## 2021-02-18 LAB — CBC
HCT: 34.7 % — ABNORMAL LOW (ref 36.0–46.0)
Hemoglobin: 11.1 g/dL — ABNORMAL LOW (ref 12.0–15.0)
MCH: 29.7 pg (ref 26.0–34.0)
MCHC: 32 g/dL (ref 30.0–36.0)
MCV: 92.8 fL (ref 80.0–100.0)
Platelets: 258 10*3/uL (ref 150–400)
RBC: 3.74 MIL/uL — ABNORMAL LOW (ref 3.87–5.11)
RDW: 14.6 % (ref 11.5–15.5)
WBC: 4.7 10*3/uL (ref 4.0–10.5)
nRBC: 0 % (ref 0.0–0.2)

## 2021-02-18 LAB — COMPREHENSIVE METABOLIC PANEL
ALT: 46 U/L — ABNORMAL HIGH (ref 0–44)
AST: 36 U/L (ref 15–41)
Albumin: 2.4 g/dL — ABNORMAL LOW (ref 3.5–5.0)
Alkaline Phosphatase: 44 U/L (ref 38–126)
Anion gap: 4 — ABNORMAL LOW (ref 5–15)
BUN: 14 mg/dL (ref 8–23)
CO2: 30 mmol/L (ref 22–32)
Calcium: 8.8 mg/dL — ABNORMAL LOW (ref 8.9–10.3)
Chloride: 102 mmol/L (ref 98–111)
Creatinine, Ser: 0.38 mg/dL — ABNORMAL LOW (ref 0.44–1.00)
GFR, Estimated: >60 mL/min (ref >60)
Glucose, Bld: 220 mg/dL — ABNORMAL HIGH (ref 70–99)
Potassium: 4.6 mmol/L (ref 3.5–5.1)
Sodium: 136 mmol/L (ref 135–145)
Total Bilirubin: 0.3 mg/dL (ref 0.3–1.2)
Total Protein: 6.2 g/dL — ABNORMAL LOW (ref 6.5–8.1)

## 2021-02-18 LAB — GLUCOSE, CAPILLARY
Glucose-Capillary: 149 mg/dL — ABNORMAL HIGH (ref 70–99)
Glucose-Capillary: 161 mg/dL — ABNORMAL HIGH (ref 70–99)
Glucose-Capillary: 166 mg/dL — ABNORMAL HIGH (ref 70–99)
Glucose-Capillary: 199 mg/dL — ABNORMAL HIGH (ref 70–99)
Glucose-Capillary: 223 mg/dL — ABNORMAL HIGH (ref 70–99)
Glucose-Capillary: 234 mg/dL — ABNORMAL HIGH (ref 70–99)

## 2021-02-18 NOTE — Progress Notes (Signed)
PROGRESS NOTE    Erica Mueller  QQV:[id number] DOB: [birth year] DOA: 02/07/2021 PCP: [facility]   Assessment & Plan:   Active Problems:   Sepsis ([place])   Pressure injury of skin   Protein-calorie malnutrition, severe  Failure to thrive: secondary to all below. Poor prognosis but pt's son wants to continue care and to remain full code  Severe sepsis: likely due to aspiration pneumonia. Met criteria w/ tachycardia, tachypnea, encephalopathy, & pneumonia. Initially thought to be UTI, but culture negative, further chest imaging showed consolidation in RUL/RLL consistent with aspiration.  Completed abx course. Resolved   Aspiration pneumonia: completed abx course. Continue on bronchodilators    Hypernatremia: resolved    Hypokalemia: WNL    Hypophosphatemia: WNL   Transaminitis: etiology unclear, labile. AST is WNL, ALT is still elevated.   COVID19 infection: diagnosed 8/18, had no symptoms.  Well outside isolation period.   Acute on chronic metabolic encephalopathy: unchanged from day prior and likely will remain that way. Dependent on feeding tube for fluids & nutrition. Will likely remain bedbound, nonverbal, does not follow commands   Cardiomyopathy: w/ elevated tropoins. Echo 8/30 showed reduced EF, new finding, without symptoms of CHF.  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 Unlikely a candidate for any invasive treatment/tests   Dysphagia: continue w/ NG tube and tube feeds. Discussed PEG tube w/ pt's son and he wants the pt to have if she unable to eat/drink herself.   DM2: likely poorly controlled. Continue on glargine, SSI w/ accuchecks    CAD: continue on statin, metoprolol   HLD: continue on statin   HTN: continue on BB   Schizophrenia: continue to hold home anti-psychotics until mentation is better. Possible dementia.   Nonsustained VT: asymptomatic, hemodynamically stable.    AKI: resolved   Stage II pressure injury sacrum: present on  admission. Continue w/ wound care    Thrombocytopenia: resolved    Severe protein calorie malnutrition: continue w/ tube feeds via NG tube. Will likely need to have PEG tube placed    DVT prophylaxis: heparin  Code Status:  full  Family Communication: discussed pt's care w/ pt's son, [place], and answered his questions  Disposition Plan:  unclear.  Level of care: Med-Surg \ Status is: Inpatient  Remains inpatient appropriate because:Altered mental status, Unsafe d/c plan, IV treatments appropriate due to intensity of illness or inability to take PO, and Inpatient level of care appropriate due to severity of illness, if pt continues to be unable to eat/swallow, pt will need a PEG tube prior to d/c  Dispo: The patient is from: Home              Anticipated d/c is to:  unclear              Patient currently is medically unstable for d/c   Difficult to place patient : unclear   Consultants:  Palliative care  Procedures: (  Antimicrobials:   Subjective: Pt is awake but still non-verbal.  Objective: Vitals:   02/17/21 1955 02/18/21 0432 02/18/21 0500 02/18/21 0741  BP: (!) 173/56 (!) 175/58  (!) 146/59  Pulse: 80 83  86  Resp: 18 17  15   Temp: 97.8 F (36.6 C) (!) 97.5 F (36.4 C)  98.4 F (36.9 C)  TempSrc:      SpO2: 100% 100%  98%  Weight:   49.5 kg   Height:        Intake/Output Summary (Last 24 hours) at 02/18/2021 0820  Last data filed at 02/18/2021 0500 Gross per 24 hour  Intake --  Output 3300 ml  Net -3300 ml   Filed Weights   02/15/21 0415 02/17/21 0656 02/18/21 0500  Weight: 53.1 kg 50.3 kg 49.5 kg    Examination:  General exam: Appears calm. Appears older than stated age  Respiratory system: Clear breath sounds b/l. No wheezes, or rales Cardiovascular system: S1/S2+. No rubs or clicks  Gastrointestinal system: Abd is soft, NT, ND & hypoactive bowel sounds  Central nervous system: Awake but non-verbal. Moves all extremities  Psychiatry: Judgement and  insight appear abnormal. Flat mood and affect    Data Reviewed: I have personally reviewed following labs and imaging studies  CBC: Recent Labs  Lab 02/13/21 0629 02/16/21 0433 02/17/21 0431 02/18/21 0417  WBC 6.5 5.5 6.8 4.7  HGB 10.9* 12.3 11.0* 11.1*  HCT 33.8* 38.7 34.5* 34.7*  MCV 93.9 92.1 91.8 92.8  PLT 169 235 235 604   Basic Metabolic Panel: Recent Labs  Lab 02/12/21 0631 02/13/21 0629 02/14/21 0412 02/15/21 0406 02/16/21 0433 02/17/21 0431 02/18/21 0417  NA 142 140 139 139 138 134* 136  K 4.1 3.9 4.9 4.5 3.9 4.4 4.6  CL 108 107 107 107 103 99 102  CO2 29 29 27 27 27 28 30   GLUCOSE 235* 138* 267* 218* 63* 197* 220*  BUN 16 15 16 14 12 13 14   CREATININE 0.68 0.51 0.54 0.44 0.33* 0.44 0.38*  CALCIUM 8.2* 8.1* 8.0* 8.3* 8.7* 8.8* 8.8*  MG 2.0 2.0 2.0  --   --   --   --   PHOS 2.7 2.4* 3.4  --   --   --   --    GFR: Estimated Creatinine Clearance: 51.1 mL/min (A) (by C-G formula based on SCr of 0.38 mg/dL (L)). Liver Function Tests: Recent Labs  Lab 02/14/21 0412 02/15/21 0406 02/16/21 0433 02/17/21 0431 02/18/21 0417  AST 47* 47* 55* 29 36  ALT 51* 50* 60* 45* 46*  ALKPHOS 45 47 47 49 44  BILITOT 0.3 0.3 0.5 0.4 0.3  PROT 5.1* 5.5* 6.7 6.2* 6.2*  ALBUMIN 2.0* 2.1* 2.6* 2.4* 2.4*   No results for input(s): LIPASE, AMYLASE in the last 168 hours. No results for input(s): AMMONIA in the last 168 hours. Coagulation Profile: No results for input(s): INR, PROTIME in the last 168 hours. Cardiac Enzymes: No results for input(s): CKTOTAL, CKMB, CKMBINDEX, TROPONINI in the last 168 hours. BNP (last 3 results) No results for input(s): PROBNP in the last 8760 hours. HbA1C: No results for input(s): HGBA1C in the last 72 hours. CBG: Recent Labs  Lab 02/17/21 1606 02/17/21 1957 02/18/21 0013 02/18/21 0437 02/18/21 0743  GLUCAP 137* 114* 149* 234* 161*   Lipid Profile: No results for input(s): CHOL, HDL, LDLCALC, TRIG, CHOLHDL, LDLDIRECT in the last  72 hours. Thyroid Function Tests: No results for input(s): TSH, T4TOTAL, FREET4, T3FREE, THYROIDAB in the last 72 hours. Anemia Panel: No results for input(s): VITAMINB12, FOLATE, FERRITIN, TIBC, IRON, RETICCTPCT in the last 72 hours. Sepsis Labs: No results for input(s): PROCALCITON, LATICACIDVEN in the last 168 hours.   Recent Results (from the past 240 hour(s))  MRSA Next Gen by PCR, Nasal     Status: None   Collection Time: 02/09/21  9:47 AM   Specimen: Nasal Mucosa; Nasal Swab  Result Value Ref Range Status   MRSA by PCR Next Gen NOT DETECTED NOT DETECTED Final    Comment: (NOTE) The GeneXpert MRSA  Assay (FDA approved for NASAL specimens only), is one component of a comprehensive MRSA colonization surveillance program. It is not intended to diagnose MRSA infection nor to guide or monitor treatment for MRSA infections. Test performance is not FDA approved in patients less than 3 years old. Performed at [facility], [address] [place], [place] [address]          Radiology Studies: No results found.      Scheduled Meds:  vitamin C  500 mg Per Tube Daily   chlorhexidine  15 mL Mouth Rinse BID   Chlorhexidine Gluconate Cloth  6 each Topical Daily   free water  100 mL Per Tube Q4H   heparin  5,000 Units Subcutaneous Q8H   insulin aspart  0-15 Units Subcutaneous Q4H   insulin glargine-yfgn  14 Units Subcutaneous Daily   mouth rinse  15 mL Mouth Rinse q12n4p   metoprolol tartrate  25 mg Per NG tube BID   pravastatin  40 mg Per NG tube q1800   Continuous Infusions:  feeding supplement (OSMOLITE 1.2 CAL) 1,000 mL (02/16/21 2211)     LOS: 11 days    Time spent: 15 mins     Wyvonnia Dusky, MD [facility] Pager [phone]  If 7PM-7AM, please contact night-coverage 02/18/2021, 8:20 AM

## 2021-02-19 DIAGNOSIS — G9341 Metabolic encephalopathy: Secondary | ICD-10-CM | POA: Diagnosis not present

## 2021-02-19 DIAGNOSIS — R1319 Other dysphagia: Secondary | ICD-10-CM | POA: Diagnosis not present

## 2021-02-19 DIAGNOSIS — R627 Adult failure to thrive: Secondary | ICD-10-CM | POA: Diagnosis not present

## 2021-02-19 LAB — COMPREHENSIVE METABOLIC PANEL
ALT: 44 U/L (ref 0–44)
AST: 33 U/L (ref 15–41)
Albumin: 2.4 g/dL — ABNORMAL LOW (ref 3.5–5.0)
Alkaline Phosphatase: 44 U/L (ref 38–126)
Anion gap: 6 (ref 5–15)
BUN: 16 mg/dL (ref 8–23)
CO2: 29 mmol/L (ref 22–32)
Calcium: 8.7 mg/dL — ABNORMAL LOW (ref 8.9–10.3)
Chloride: 103 mmol/L (ref 98–111)
Creatinine, Ser: 0.56 mg/dL (ref 0.44–1.00)
GFR, Estimated: >60 mL/min (ref >60)
Glucose, Bld: 162 mg/dL — ABNORMAL HIGH (ref 70–99)
Potassium: 4.8 mmol/L (ref 3.5–5.1)
Sodium: 138 mmol/L (ref 135–145)
Total Bilirubin: 0.4 mg/dL (ref 0.3–1.2)
Total Protein: 6 g/dL — ABNORMAL LOW (ref 6.5–8.1)

## 2021-02-19 LAB — CBC
HCT: 34.7 % — ABNORMAL LOW (ref 36.0–46.0)
Hemoglobin: 10.9 g/dL — ABNORMAL LOW (ref 12.0–15.0)
MCH: 29.5 pg (ref 26.0–34.0)
MCHC: 31.4 g/dL (ref 30.0–36.0)
MCV: 93.8 fL (ref 80.0–100.0)
Platelets: 238 10*3/uL (ref 150–400)
RBC: 3.7 MIL/uL — ABNORMAL LOW (ref 3.87–5.11)
RDW: 14.9 % (ref 11.5–15.5)
WBC: 4.4 10*3/uL (ref 4.0–10.5)
nRBC: 0 % (ref 0.0–0.2)

## 2021-02-19 LAB — GLUCOSE, CAPILLARY
Glucose-Capillary: 165 mg/dL — ABNORMAL HIGH (ref 70–99)
Glucose-Capillary: 170 mg/dL — ABNORMAL HIGH (ref 70–99)
Glucose-Capillary: 183 mg/dL — ABNORMAL HIGH (ref 70–99)
Glucose-Capillary: 205 mg/dL — ABNORMAL HIGH (ref 70–99)
Glucose-Capillary: 211 mg/dL — ABNORMAL HIGH (ref 70–99)
Glucose-Capillary: 236 mg/dL — ABNORMAL HIGH (ref 70–99)

## 2021-02-19 MED ORDER — INFLUENZA VAC A&B SA ADJ QUAD 0.5 ML IM PRSY
0.5000 mL | PREFILLED_SYRINGE | INTRAMUSCULAR | Status: AC
  Administered 2021-02-20: 0.5 mL via INTRAMUSCULAR
  Filled 2021-02-19: qty 0.5

## 2021-02-19 NOTE — Progress Notes (Signed)
PROGRESS NOTE    Erica Mueller  MCN:[id number] DOB: Jun 18, 1950 DOA: 02/07/2021 PCP: [facility]   Assessment & Plan:   Active Problems:   Sepsis ([place])   Pressure injury of skin   Protein-calorie malnutrition, severe  Failure to thrive: secondary to all below. Poor prognosis. Will continue w/ care and full code as per pt's son  Severe sepsis: likely due to aspiration pneumonia. Met criteria w/ tachycardia, tachypnea, encephalopathy, & pneumonia. Initially thought to be UTI, but culture negative, further chest imaging showed consolidation in RUL/RLL consistent with aspiration.  Completed abx course. Resolved   Aspiration pneumonia: completed abx course. Continue on bronchodilators    Hypernatremia: resolved    Hypokalemia: within normal limits    Hypophosphatemia: resolved   Transaminitis: resolved.   COVID19 infection: diagnosed 8/18, had no symptoms.  Well outside isolation period.   Acute on chronic metabolic encephalopathy: unchanged from day prior and likely will remain that way. Dependent on feeding tube for fluids & nutrition. Will likely remain bedbound, nonverbal, does not follow commands   Cardiomyopathy: w/ elevated tropoins. Echo 8/30 showed reduced EF, new finding, without symptoms of CHF.  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 Unlikely a candidate for any invasive treatment/tests   Dysphagia: continue w/ NG tube and tube feeds. Discussed PEG tube w/ pt's son and he wants the pt to have if she unable to eat/drink herself.   DM2: likely poorly controlled. Continue on glargine, SSI w/ accuchecks    CAD: continue on metoprolol, statin   HLD:  continue on statin   HTN: continue on metoprolol   Schizophrenia: continue to hold home anti-psychotics until mentation is better. Possible dementia.   Nonsustained VT: asymptomatic, hemodynamically stable.    AKI: resolved   Stage II pressure injury sacrum: present on admission. Continue w/ wound care     Thrombocytopenia: resolved    Severe protein calorie malnutrition: continue w/ tube feeds via NG tube. Will likely need to have a PEG tube placed    DVT prophylaxis: heparin  Code Status:  full  Family Communication: Disposition Plan:  unclear.  Level of care: Med-Surg \ Status is: Inpatient  Remains inpatient appropriate because:Altered mental status, Unsafe d/c plan, IV treatments appropriate due to intensity of illness or inability to take PO, and Inpatient level of care appropriate due to severity of illness, if pt continues to be unable to eat/swallow, pt will need a PEG tube prior to d/c  Dispo: The patient is from: Home              Anticipated d/c is to:  unclear              Patient currently is medically unstable for d/c   Difficult to place patient : unclear   Consultants:  Palliative care  Procedures:   Antimicrobials:   Subjective: Pt is non-verbal   Objective: Vitals:   02/18/21 2024 02/19/21 0006 02/19/21 0445 02/19/21 0500  BP: (!) 174/59 (!) 160/51 (!) 150/57   Pulse: 82 78 85   Resp: _0 Temp: 98.1 F (36.7 C) 97.6 F (36.4 C) 97.6 F (36.4 C)   TempSrc:      SpO2: 100% 99% 96%   Weight:    50.8 kg  Height:        Intake/Output Summary (Last 24 hours) at 02/19/2021 0747 Last data filed at 02/19/2021 0400 Gross per 24 hour  Intake 60 ml  Output 600 ml  Net -540  ml   Filed Weights   02/17/21 0656 02/18/21 0500 02/19/21 0500  Weight: 50.3 kg 49.5 kg 50.8 kg    Examination:  General exam: Appears comfortable. Appears older than stated age Respiratory system: Clear breath sounds b/l.  Cardiovascular system: S1 & S2+. No rubs or clicks Gastrointestinal system: Abd is soft, NT, ND & hypoactive bowel sounds  Central nervous system: Awake and non-verbal. Moves all extremities  Psychiatry: Judgement and insight appear abnormal. Flat mood and affect    Data Reviewed: I have personally reviewed following labs and imaging  studies  CBC: Recent Labs  Lab 02/13/21 0629 02/16/21 0433 02/17/21 0431 02/18/21 0417 02/19/21 0407  WBC 6.5 5.5 6.8 4.7 4.4  HGB 10.9* 12.3 11.0* 11.1* 10.9*  HCT 33.8* 38.7 34.5* 34.7* 34.7*  MCV 93.9 92.1 91.8 92.8 93.8  PLT 169 235 235 258 657   Basic Metabolic Panel: Recent Labs  Lab 02/13/21 0629 02/14/21 0412 02/15/21 0406 02/16/21 0433 02/17/21 0431 02/18/21 0417 02/19/21 0407  NA 140 139 139 138 134* 136 138  K 3.9 4.9 4.5 3.9 4.4 4.6 4.8  CL 107 107 107 103 99 102 103  CO2 _0 GLUCOSE 138* 267* 218* 63* 197* 220* 162*  BUN _1 CREATININE 0.51 0.54 0.44 0.33* 0.44 0.38* 0.56  CALCIUM 8.1* 8.0* 8.3* 8.7* 8.8* 8.8* 8.7*  MG 2.0 2.0  --   --   --   --   --   PHOS 2.4* 3.4  --   --   --   --   --    GFR: Estimated Creatinine Clearance: 51.8 mL/min (by C-G formula based on SCr of 0.56 mg/dL). Liver Function Tests: Recent Labs  Lab 02/15/21 0406 02/16/21 0433 02/17/21 0431 02/18/21 0417 02/19/21 0407  AST 47* 55* 29 36 33  ALT 50* 60* 45* 46* 44  ALKPHOS 47 47 49 44 44  BILITOT 0.3 0.5 0.4 0.3 0.4  PROT 5.5* 6.7 6.2* 6.2* 6.0*  ALBUMIN 2.1* 2.6* 2.4* 2.4* 2.4*   No results for input(s): LIPASE, AMYLASE in the last 168 hours. No results for input(s): AMMONIA in the last 168 hours. Coagulation Profile: No results for input(s): INR, PROTIME in the last 168 hours. Cardiac Enzymes: No results for input(s): CKTOTAL, CKMB, CKMBINDEX, TROPONINI in the last 168 hours. BNP (last 3 results) No results for input(s): PROBNP in the last 8760 hours. HbA1C: No results for input(s): HGBA1C in the last 72 hours. CBG: Recent Labs  Lab 02/18/21 1231 02/18/21 1625 02/18/21 2026 02/19/21 0009 02/19/21 0445  GLUCAP 199* 223* 166* 165* 183*   Lipid Profile: No results for input(s): CHOL, HDL, LDLCALC, TRIG, CHOLHDL, LDLDIRECT in the last 72 hours. Thyroid Function Tests: No results for input(s): TSH, T4TOTAL, FREET4, T3FREE,  THYROIDAB in the last 72 hours. Anemia Panel: No results for input(s): VITAMINB12, FOLATE, FERRITIN, TIBC, IRON, RETICCTPCT in the last 72 hours. Sepsis Labs: No results for input(s): PROCALCITON, LATICACIDVEN in the last 168 hours.   Recent Results (from the past 240 hour(s))  MRSA Next Gen by PCR, Nasal     Status: None   Collection Time: 02/09/21  9:47 AM   Specimen: Nasal Mucosa; Nasal Swab  Result Value Ref Range Status   MRSA by PCR Next Gen NOT DETECTED NOT DETECTED Final    Comment: (NOTE) The GeneXpert MRSA Assay (FDA approved for NASAL specimens only), is one component of a comprehensive MRSA  colonization surveillance program. It is not intended to diagnose MRSA infection nor to guide or monitor treatment for MRSA infections. Test performance is not FDA approved in patients less than 84 years old. Performed at [facility], [address] [place], [place] [address]          Radiology Studies: US Venous Img Upper Uni Right(DVT)  Result Date: 02/18/2021 CLINICAL DATA:  Right upper extremity edema EXAM: RIGHT UPPER EXTREMITY VENOUS DOPPLER ULTRASOUND TECHNIQUE: Gray-scale sonography with graded compression, as well as color Doppler and duplex ultrasound were performed to evaluate the upper extremity deep venous system from the level of the subclavian vein and including the jugular, axillary, basilic, radial, ulnar and upper cephalic vein. Spectral Doppler was utilized to evaluate flow at rest and with distal augmentation maneuvers. COMPARISON:  None. FINDINGS: Contralateral Subclavian Vein: Respiratory phasicity is normal and symmetric with the symptomatic side. No evidence of thrombus. Normal compressibility. Internal Jugular Vein: No evidence of thrombus. Normal compressibility, respiratory phasicity and response to augmentation. Subclavian Vein: No evidence of thrombus. Normal compressibility, respiratory phasicity and response to augmentation. Axillary Vein: No  evidence of thrombus. Normal compressibility, respiratory phasicity and response to augmentation. Cephalic Vein: The cephalic vein is not compressible beginning in the distal upper arm and extending into the forearm. The lumen is expanded and filled with low-level internal echoes. No evidence of color flow on color Doppler imaging. Findings are consistent with occlusive superficial thrombosis. Basilic Vein: No evidence of thrombus. Normal compressibility, respiratory phasicity and response to augmentation. Brachial Veins: No evidence of thrombus. Normal compressibility, respiratory phasicity and response to augmentation. Radial Veins: No evidence of thrombus. Normal compressibility, respiratory phasicity and response to augmentation. Ulnar Veins: No evidence of thrombus. Normal compressibility, respiratory phasicity and response to augmentation. Venous Reflux:  None visualized. Other Findings: Indeterminate complex and heterogeneously echogenic structure within the superficial soft tissues of the right axilla measures 1.5 x 0.7 x 2.5 cm. There is posterior acoustic enhancement suggesting that this is a complex cystic structure. No evidence of internal vascularity on color Doppler imaging to suggest a vascular pedicle and therefore an abnormal lymph node. IMPRESSION: 1. Positive for superficial thrombophlebitis of the cephalic vein beginning in the distal upper arm and extending into the forearm. 2. Nonspecific complex cystic structure in the superficial soft tissues of the right axilla. This may represent a hematoma, or possibly an abscess in the appropriate clinical setting. Pathologic lymphadenopathy is considered less likely given the absence of vascularity within the structure. 3. No evidence of deep venous thrombosis. Electronically Signed   By: Jacqulynn Cadet M.D.   On: 02/18/2021 08:33        Scheduled Meds:  vitamin C  500 mg Per Tube Daily   chlorhexidine  15 mL Mouth Rinse BID   Chlorhexidine  Gluconate Cloth  6 each Topical Daily   free water  100 mL Per Tube Q4H   heparin  5,000 Units Subcutaneous Q8H   [START ON 02/20/2021] influenza vaccine adjuvanted  0.5 mL Intramuscular Tomorrow-1000   insulin aspart  0-15 Units Subcutaneous Q4H   insulin glargine-yfgn  14 Units Subcutaneous Daily   mouth rinse  15 mL Mouth Rinse q12n4p   metoprolol tartrate  25 mg Per NG tube BID   pravastatin  40 mg Per NG tube q1800   Continuous Infusions:  feeding supplement (OSMOLITE 1.2 CAL) 1,000 mL (02/16/21 2211)     LOS: 12 days    Time spent: 15 mins  Wyvonnia Dusky, MD [facility] Pager [phone]  If 7PM-7AM, please contact night-coverage 02/19/2021, 7:47 AM

## 2021-02-20 DIAGNOSIS — R627 Adult failure to thrive: Secondary | ICD-10-CM | POA: Diagnosis not present

## 2021-02-20 DIAGNOSIS — R1319 Other dysphagia: Secondary | ICD-10-CM | POA: Diagnosis not present

## 2021-02-20 DIAGNOSIS — G9341 Metabolic encephalopathy: Secondary | ICD-10-CM | POA: Diagnosis not present

## 2021-02-20 LAB — BASIC METABOLIC PANEL
Anion gap: 6 (ref 5–15)
BUN: 17 mg/dL (ref 8–23)
CO2: 27 mmol/L (ref 22–32)
Calcium: 8.5 mg/dL — ABNORMAL LOW (ref 8.9–10.3)
Chloride: 102 mmol/L (ref 98–111)
Creatinine, Ser: 0.46 mg/dL (ref 0.44–1.00)
GFR, Estimated: >60 mL/min (ref >60)
Glucose, Bld: 216 mg/dL — ABNORMAL HIGH (ref 70–99)
Potassium: 4.6 mmol/L (ref 3.5–5.1)
Sodium: 135 mmol/L (ref 135–145)

## 2021-02-20 LAB — CBC
HCT: 31.2 % — ABNORMAL LOW (ref 36.0–46.0)
Hemoglobin: 10.1 g/dL — ABNORMAL LOW (ref 12.0–15.0)
MCH: 30.4 pg (ref 26.0–34.0)
MCHC: 32.4 g/dL (ref 30.0–36.0)
MCV: 94 fL (ref 80.0–100.0)
Platelets: 233 10*3/uL (ref 150–400)
RBC: 3.32 MIL/uL — ABNORMAL LOW (ref 3.87–5.11)
RDW: 15.2 % (ref 11.5–15.5)
WBC: 5.2 10*3/uL (ref 4.0–10.5)
nRBC: 0 % (ref 0.0–0.2)

## 2021-02-20 LAB — GLUCOSE, CAPILLARY
Glucose-Capillary: 129 mg/dL — ABNORMAL HIGH (ref 70–99)
Glucose-Capillary: 158 mg/dL — ABNORMAL HIGH (ref 70–99)
Glucose-Capillary: 171 mg/dL — ABNORMAL HIGH (ref 70–99)
Glucose-Capillary: 193 mg/dL — ABNORMAL HIGH (ref 70–99)
Glucose-Capillary: 201 mg/dL — ABNORMAL HIGH (ref 70–99)
Glucose-Capillary: 217 mg/dL — ABNORMAL HIGH (ref 70–99)

## 2021-02-20 MED ORDER — INSULIN ASPART 100 UNIT/ML IJ SOLN
0.0000 [IU] | INTRAMUSCULAR | Status: DC
  Administered 2021-02-20: 1 [IU] via SUBCUTANEOUS
  Administered 2021-02-20: 3 [IU] via SUBCUTANEOUS
  Administered 2021-02-21: 1 [IU] via SUBCUTANEOUS
  Administered 2021-02-21: 2 [IU] via SUBCUTANEOUS
  Administered 2021-02-21: 3 [IU] via SUBCUTANEOUS
  Administered 2021-02-21: 1 [IU] via SUBCUTANEOUS
  Administered 2021-02-21: 2 [IU] via SUBCUTANEOUS
  Administered 2021-02-22: 5 [IU] via SUBCUTANEOUS
  Administered 2021-02-22 (×3): 2 [IU] via SUBCUTANEOUS
  Administered 2021-02-22 – 2021-02-23 (×3): 1 [IU] via SUBCUTANEOUS
  Administered 2021-02-23 – 2021-02-25 (×3): 2 [IU] via SUBCUTANEOUS
  Administered 2021-02-25: 5 [IU] via SUBCUTANEOUS
  Administered 2021-02-25: 1 [IU] via SUBCUTANEOUS
  Administered 2021-02-26 (×2): 3 [IU] via SUBCUTANEOUS
  Administered 2021-02-26 (×3): 2 [IU] via SUBCUTANEOUS
  Administered 2021-02-26 – 2021-02-27 (×3): 5 [IU] via SUBCUTANEOUS
  Administered 2021-02-27 (×2): 3 [IU] via SUBCUTANEOUS
  Administered 2021-02-27: 5 [IU] via SUBCUTANEOUS
  Administered 2021-02-27: 1 [IU] via SUBCUTANEOUS
  Administered 2021-02-28 (×4): 2 [IU] via SUBCUTANEOUS
  Administered 2021-02-28: 3 [IU] via SUBCUTANEOUS
  Administered 2021-03-01: 2 [IU] via SUBCUTANEOUS
  Administered 2021-03-01: 3 [IU] via SUBCUTANEOUS
  Administered 2021-03-01: 1 [IU] via SUBCUTANEOUS
  Administered 2021-03-01: 2 [IU] via SUBCUTANEOUS
  Administered 2021-03-02: 5 [IU] via SUBCUTANEOUS
  Administered 2021-03-02: 1 [IU] via SUBCUTANEOUS
  Administered 2021-03-02: 3 [IU] via SUBCUTANEOUS
  Administered 2021-03-02: 1 [IU] via SUBCUTANEOUS
  Administered 2021-03-03 – 2021-03-04 (×5): 2 [IU] via SUBCUTANEOUS
  Administered 2021-03-04 (×3): 3 [IU] via SUBCUTANEOUS
  Administered 2021-03-04 (×2): 2 [IU] via SUBCUTANEOUS
  Administered 2021-03-05: 3 [IU] via SUBCUTANEOUS
  Administered 2021-03-05 (×3): 2 [IU] via SUBCUTANEOUS
  Administered 2021-03-05: 3 [IU] via SUBCUTANEOUS
  Administered 2021-03-05: 2 [IU] via SUBCUTANEOUS
  Administered 2021-03-06: 7 [IU] via SUBCUTANEOUS
  Administered 2021-03-06: 2 [IU] via SUBCUTANEOUS
  Administered 2021-03-06: 3 [IU] via SUBCUTANEOUS
  Administered 2021-03-06 (×2): 2 [IU] via SUBCUTANEOUS
  Administered 2021-03-06: 3 [IU] via SUBCUTANEOUS
  Administered 2021-03-07: 2 [IU] via SUBCUTANEOUS
  Administered 2021-03-07 (×2): 3 [IU] via SUBCUTANEOUS
  Administered 2021-03-07: 2 [IU] via SUBCUTANEOUS
  Administered 2021-03-07 (×2): 3 [IU] via SUBCUTANEOUS
  Administered 2021-03-08 (×2): 2 [IU] via SUBCUTANEOUS
  Administered 2021-03-08: 1 [IU] via SUBCUTANEOUS
  Administered 2021-03-09: 2 [IU] via SUBCUTANEOUS
  Administered 2021-03-09: 5 [IU] via SUBCUTANEOUS
  Administered 2021-03-09: 2 [IU] via SUBCUTANEOUS
  Administered 2021-03-09: 1 [IU] via SUBCUTANEOUS
  Administered 2021-03-09: 3 [IU] via SUBCUTANEOUS
  Administered 2021-03-09: 2 [IU] via SUBCUTANEOUS
  Administered 2021-03-10 (×2): 1 [IU] via SUBCUTANEOUS
  Administered 2021-03-10: 2 [IU] via SUBCUTANEOUS
  Administered 2021-03-10: 3 [IU] via SUBCUTANEOUS
  Administered 2021-03-11 (×4): 2 [IU] via SUBCUTANEOUS
  Administered 2021-03-11: 5 [IU] via SUBCUTANEOUS
  Administered 2021-03-11 – 2021-03-12 (×3): 2 [IU] via SUBCUTANEOUS
  Administered 2021-03-12: 5 [IU] via SUBCUTANEOUS
  Administered 2021-03-12 – 2021-03-13 (×5): 2 [IU] via SUBCUTANEOUS
  Administered 2021-03-13 (×4): 3 [IU] via SUBCUTANEOUS
  Administered 2021-03-14: 2 [IU] via SUBCUTANEOUS
  Administered 2021-03-14: 5 [IU] via SUBCUTANEOUS
  Administered 2021-03-14: 3 [IU] via SUBCUTANEOUS
  Administered 2021-03-14 – 2021-03-15 (×5): 2 [IU] via SUBCUTANEOUS
  Administered 2021-03-15: 7 [IU] via SUBCUTANEOUS
  Administered 2021-03-15: 3 [IU] via SUBCUTANEOUS
  Administered 2021-03-15: 2 [IU] via SUBCUTANEOUS
  Administered 2021-03-15 – 2021-03-16 (×2): 3 [IU] via SUBCUTANEOUS
  Administered 2021-03-16: 1 [IU] via SUBCUTANEOUS
  Administered 2021-03-16 (×3): 5 [IU] via SUBCUTANEOUS
  Administered 2021-03-17: 2 [IU] via SUBCUTANEOUS
  Administered 2021-03-17: 7 [IU] via SUBCUTANEOUS
  Administered 2021-03-18: 9 [IU] via SUBCUTANEOUS
  Administered 2021-03-18: 5 [IU] via SUBCUTANEOUS
  Administered 2021-03-18 (×4): 2 [IU] via SUBCUTANEOUS
  Administered 2021-03-19: 1 [IU] via SUBCUTANEOUS
  Administered 2021-03-19: 3 [IU] via SUBCUTANEOUS
  Administered 2021-03-19: 5 [IU] via SUBCUTANEOUS
  Administered 2021-03-19: 2 [IU] via SUBCUTANEOUS
  Administered 2021-03-19: 5 [IU] via SUBCUTANEOUS
  Administered 2021-03-19: 2 [IU] via SUBCUTANEOUS
  Administered 2021-03-20: 5 [IU] via SUBCUTANEOUS
  Administered 2021-03-20: 1 [IU] via SUBCUTANEOUS
  Administered 2021-03-20: 3 [IU] via SUBCUTANEOUS
  Administered 2021-03-21: 2 [IU] via SUBCUTANEOUS
  Administered 2021-03-21: 3 [IU] via SUBCUTANEOUS
  Administered 2021-03-21: 1 [IU] via SUBCUTANEOUS
  Administered 2021-03-21: 2 [IU] via SUBCUTANEOUS
  Administered 2021-03-21: 5 [IU] via SUBCUTANEOUS
  Administered 2021-03-22 (×2): 1 [IU] via SUBCUTANEOUS
  Administered 2021-03-23: 2 [IU] via SUBCUTANEOUS
  Administered 2021-03-23: 1 [IU] via SUBCUTANEOUS
  Administered 2021-03-24: 2 [IU] via SUBCUTANEOUS
  Administered 2021-03-24: 3 [IU] via SUBCUTANEOUS
  Administered 2021-03-24: 2 [IU] via SUBCUTANEOUS
  Administered 2021-03-24 – 2021-03-25 (×2): 1 [IU] via SUBCUTANEOUS
  Administered 2021-03-25: 3 [IU] via SUBCUTANEOUS
  Administered 2021-03-25 (×2): 2 [IU] via SUBCUTANEOUS
  Administered 2021-03-25: 3 [IU] via SUBCUTANEOUS
  Administered 2021-03-26: 5 [IU] via SUBCUTANEOUS
  Administered 2021-03-26: 1 [IU] via SUBCUTANEOUS
  Administered 2021-03-26: 2 [IU] via SUBCUTANEOUS
  Administered 2021-03-26: 3 [IU] via SUBCUTANEOUS
  Administered 2021-03-27: 2 [IU] via SUBCUTANEOUS
  Administered 2021-03-27: 1 [IU] via SUBCUTANEOUS
  Administered 2021-03-27 (×2): 2 [IU] via SUBCUTANEOUS
  Administered 2021-03-28: 1 [IU] via SUBCUTANEOUS
  Administered 2021-03-28: 3 [IU] via SUBCUTANEOUS
  Administered 2021-03-28: 1 [IU] via SUBCUTANEOUS
  Administered 2021-03-28: 3 [IU] via SUBCUTANEOUS
  Administered 2021-03-28 (×2): 2 [IU] via SUBCUTANEOUS
  Administered 2021-03-29: 3 [IU] via SUBCUTANEOUS
  Administered 2021-03-29 (×2): 2 [IU] via SUBCUTANEOUS
  Filled 2021-02-20 (×166): qty 1

## 2021-02-20 MED ORDER — INSULIN ASPART 100 UNIT/ML IJ SOLN
3.0000 [IU] | INTRAMUSCULAR | Status: DC
  Administered 2021-02-20 – 2021-02-23 (×14): 3 [IU] via SUBCUTANEOUS
  Filled 2021-02-20 (×14): qty 1

## 2021-02-20 NOTE — Progress Notes (Signed)
Inpatient Diabetes Program Recommendations  AACE/ADA: New Consensus Statement on Inpatient Glycemic Control   Target Ranges:  Prepandial:   less than 140 mg/dL      Peak postprandial:   less than 180 mg/dL (1-2 hours)      Critically ill patients:  140 - 180 mg/dL    Results for Erica Mueller, Erica Mueller (MRN [id number]) as of 02/20/2021 13:44  Ref. Range 02/19/2021 08:08 02/19/2021 12:10 02/19/2021 16:32 02/19/2021 20:29 02/20/2021 00:27 02/20/2021 04:29 02/20/2021 07:40 02/20/2021 12:09  Glucose-Capillary Latest Ref Range: 70 - 99 mg/dL 950 (H) 932 (H) 671 (H) 170 (H) 158 (H) 193 (H) 201 (H) 171 (H)   Review of Glycemic Control  Current orders for Inpatient glycemic control: Semglee 14 units daily, Novolog 0-15 units Q4H; Osmolite @ 55 ml/hr   Inpatient Diabetes Program Recommendations:     Insulin: Please decrease Novolog correction to 0-9 units Q4H and order Novolog 3 units Q4H for tube feeding coverage. If tube feeding is stopped or held then Novolog tube feeding coverage should also be stopped or held.   Thanks, Orlando Penner, RN, MSN, CDE Diabetes Coordinator Inpatient Diabetes Program [phone] (Team Pager from 8am to 5pm)

## 2021-02-20 NOTE — TOC Progression Note (Signed)
Transition of Care [facility]) - Progression Note    Patient Details  Name: Erica Mueller MRN: [id number] Date of Birth: [birth year]  Transition of Care [facility]) CM/SW Contact  Barrie Dunker, RN Phone Number: 02/20/2021, 1:40 PM  Clinical Narrative:    Patient remains inpatient and will remain Fullcode, She will DC to [facility] when stable, at [place], where she is a long term resident   Expected Discharge Plan: Long Term Nursing Home Barriers to Discharge: Continued Medical Work up  Expected Discharge Plan and Services Expected Discharge Plan: Long Term Nursing Home In-house Referral: Clinical Social Work   Post Acute Care Choice: Nursing Home Living arrangements for the past 2 months:  ([facility] / Long Term Care Facility)                                       Social Determinants of Health (SDOH) Interventions    Readmission Risk Interventions No flowsheet data found.

## 2021-02-20 NOTE — Progress Notes (Addendum)
Patient has influenza vaccine ordered. Patient oriented to self, unable give permission to give vaccine.. This nurse spoke with patient's son, LC about patient receiving influenza vaccine. He states to go ahead and give her the vaccine.

## 2021-02-20 NOTE — Progress Notes (Addendum)
PROGRESS NOTE    Erica Mueller  MRN:[id number] DOB: 03/08/1951 DOA: 02/07/2021 PCP: [facility]   Assessment & Plan:   Active Problems:   Sepsis ([facility])   Pressure injury of skin   Protein-calorie malnutrition, severe  Failure to thrive: secondary to all below. Poor prognosis. Will continue w/ care and full code as per pt's son  Dysphagia: continue w/ NG tube and tube feeds. NG tube placed on 9/2. Will continue w/ NG tube for 2 weeks to see if there is any improvement, if not will have PEG tubed placed. Pt's son was previously agreeable to placement of PEG tube   Severe sepsis: likely due to aspiration pneumonia. Met criteria w/ tachycardia, tachypnea, encephalopathy, & pneumonia. Initially thought to be UTI, but culture negative, further chest imaging showed consolidation in RUL/RLL consistent with aspiration.  Completed abx course. Resolved   Aspiration pneumonia: completed abx course. Continue on bronchodilators    Hypernatremia: resolved    Hypokalemia: WNL    Hypophosphatemia: resolved   Transaminitis: resolved.   COVID19 infection: diagnosed 8/18, had no symptoms.  Well outside isolation period.   Acute on chronic metabolic encephalopathy: unchanged this entire week. Dependent on feeding tube for fluids & nutrition. Will likely remain bedbound, nonverbal and not following commands  Cardiomyopathy: w/ elevated tropoins. Echo 8/30 showed reduced EF, new finding, without symptoms of CHF.  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 Unlikely a candidate for any invasive treatment/tests  DM2: likely poorly controlled. Continue on glargine, SSI w/ accuchecks    CAD: continue on BB, statin   HLD:  continue on statin   HTN: continue on metoprolol   Schizophrenia: continue to hold home anti-psychotics until mentation is better. Possible dementia.   Nonsustained VT: asymptomatic, hemodynamically stable.    AKI: resolved   Stage II pressure injury sacrum:  present on admission. Continue w/ wound care    Thrombocytopenia: resolved    Severe protein calorie malnutrition: continue w/ tube feeds via NG tube. Will likely need to have a PEG tube placed    DVT prophylaxis: heparin  Code Status:  full  Family Communication: called pt's son, LC, no answer Disposition Plan:  [facility]  Level of care: Med-Surg  Status is: Inpatient  Remains inpatient appropriate because:Altered mental status, Unsafe d/c plan, IV treatments appropriate due to intensity of illness or inability to take PO, and Inpatient level of care appropriate due to severity of illness, if pt continues to be unable to eat/swallow, pt will need a PEG tube prior to d/c  Dispo: The patient is from: Home              Anticipated d/c is to:  unclear              Patient currently is medically unstable for d/c   Difficult to place patient : unclear   Consultants:  Palliative care  Procedures:   Antimicrobials:   Subjective: Pt remains nonverbal, bedbound and dependent on tube feeds  Objective: Vitals:   02/19/21 1522 02/19/21 2030 02/20/21 0500 02/20/21 0736  BP: (!) 138/44 (!) 137/50  (!) 146/50  Pulse: 66 90  89  Resp: 17 16  15  Temp: 98.3 F (36.8 C) 97.8 F (36.6 C)  (!) 97.4 F (36.3 C)  TempSrc: Axillary     SpO2: 100% 98%  99%  Weight:   50 kg   Height:       No intake or output data in the 24 hours   ending 02/20/21 0750  Filed Weights   02/18/21 0500 02/19/21 0500 02/20/21 0500  Weight: 49.5 kg 50.8 kg 50 kg    Examination:  General exam: Appears comfortable. Appears older than stated age  Respiratory system: Clear breath sounds b/l. No rubs  Cardiovascular system: S1/S2+. No rubs or clicks  Gastrointestinal system: Abd is soft, NT, ND & hypoactive bowel sounds  Central nervous system: Awake but nonverbal. Moves all extremities   Psychiatry: Judgement and insight appear abnormal. Flat mood and affect    Data Reviewed: I have  personally reviewed following labs and imaging studies  CBC: Recent Labs  Lab 02/16/21 0433 02/17/21 0431 02/18/21 0417 02/19/21 0407 02/20/21 0640  WBC 5.5 6.8 4.7 4.4 5.2  HGB 12.3 11.0* 11.1* 10.9* 10.1*  HCT 38.7 34.5* 34.7* 34.7* 31.2*  MCV 92.1 91.8 92.8 93.8 94.0  PLT 235 235 258 238 233   Basic Metabolic Panel: Recent Labs  Lab 02/14/21 0412 02/15/21 0406 02/16/21 0433 02/17/21 0431 02/18/21 0417 02/19/21 0407 02/20/21 0640  NA 139   < > 138 134* 136 138 135  K 4.9   < > 3.9 4.4 4.6 4.8 4.6  CL 107   < > 103 99 102 103 102  CO2 27   < > 27 28 30 29 27  GLUCOSE 267*   < > 63* 197* 220* 162* 216*  BUN 16   < > 12 13 14 16 17  CREATININE 0.54   < > 0.33* 0.44 0.38* 0.56 0.46  CALCIUM 8.0*   < > 8.7* 8.8* 8.8* 8.7* 8.5*  MG 2.0  --   --   --   --   --   --   PHOS 3.4  --   --   --   --   --   --    < > = values in this interval not displayed.   GFR: Estimated Creatinine Clearance: 51.6 mL/min (by C-G formula based on SCr of 0.46 mg/dL). Liver Function Tests: Recent Labs  Lab 02/15/21 0406 02/16/21 0433 02/17/21 0431 02/18/21 0417 02/19/21 0407  AST 47* 55* 29 36 33  ALT 50* 60* 45* 46* 44  ALKPHOS 47 47 49 44 44  BILITOT 0.3 0.5 0.4 0.3 0.4  PROT 5.5* 6.7 6.2* 6.2* 6.0*  ALBUMIN 2.1* 2.6* 2.4* 2.4* 2.4*   No results for input(s): LIPASE, AMYLASE in the last 168 hours. No results for input(s): AMMONIA in the last 168 hours. Coagulation Profile: No results for input(s): INR, PROTIME in the last 168 hours. Cardiac Enzymes: No results for input(s): CKTOTAL, CKMB, CKMBINDEX, TROPONINI in the last 168 hours. BNP (last 3 results) No results for input(s): PROBNP in the last 8760 hours. HbA1C: No results for input(s): HGBA1C in the last 72 hours. CBG: Recent Labs  Lab 02/19/21 1632 02/19/21 2029 02/20/21 0027 02/20/21 0429 02/20/21 0740  GLUCAP 205* 170* 158* 193* 201*   Lipid Profile: No results for input(s): CHOL, HDL, LDLCALC, TRIG, CHOLHDL,  LDLDIRECT in the last 72 hours. Thyroid Function Tests: No results for input(s): TSH, T4TOTAL, FREET4, T3FREE, THYROIDAB in the last 72 hours. Anemia Panel: No results for input(s): VITAMINB12, FOLATE, FERRITIN, TIBC, IRON, RETICCTPCT in the last 72 hours. Sepsis Labs: No results for input(s): PROCALCITON, LATICACIDVEN in the last 168 hours.   No results found for this or any previous visit (from the past 240 hour(s)).        Radiology Studies: No results found.      Scheduled   Meds:  vitamin C  500 mg Per Tube Daily   chlorhexidine  15 mL Mouth Rinse BID   Chlorhexidine Gluconate Cloth  6 each Topical Daily   free water  100 mL Per Tube Q4H   heparin  5,000 Units Subcutaneous Q8H   influenza vaccine adjuvanted  0.5 mL Intramuscular Tomorrow-1000   insulin aspart  0-15 Units Subcutaneous Q4H   insulin glargine-yfgn  14 Units Subcutaneous Daily   mouth rinse  15 mL Mouth Rinse q12n4p   metoprolol tartrate  25 mg Per NG tube BID   pravastatin  40 mg Per NG tube q1800   Continuous Infusions:  feeding supplement (OSMOLITE 1.2 CAL) 1,000 mL (02/16/21 2211)     LOS: 13 days    Time spent: 15 mins     Wyvonnia Dusky, MD [facility] Pager [phone]  If 7PM-7AM, please contact night-coverage 02/20/2021, 7:50 AM

## 2021-02-21 DIAGNOSIS — R627 Adult failure to thrive: Secondary | ICD-10-CM | POA: Diagnosis not present

## 2021-02-21 DIAGNOSIS — G9341 Metabolic encephalopathy: Secondary | ICD-10-CM | POA: Diagnosis not present

## 2021-02-21 DIAGNOSIS — R1319 Other dysphagia: Secondary | ICD-10-CM | POA: Diagnosis not present

## 2021-02-21 LAB — CBC
HCT: 32.3 % — ABNORMAL LOW (ref 36.0–46.0)
Hemoglobin: 10.1 g/dL — ABNORMAL LOW (ref 12.0–15.0)
MCH: 29.9 pg (ref 26.0–34.0)
MCHC: 31.3 g/dL (ref 30.0–36.0)
MCV: 95.6 fL (ref 80.0–100.0)
Platelets: 231 10*3/uL (ref 150–400)
RBC: 3.38 MIL/uL — ABNORMAL LOW (ref 3.87–5.11)
RDW: 15.3 % (ref 11.5–15.5)
WBC: 5.2 10*3/uL (ref 4.0–10.5)
nRBC: 0 % (ref 0.0–0.2)

## 2021-02-21 LAB — GLUCOSE, CAPILLARY
Glucose-Capillary: 137 mg/dL — ABNORMAL HIGH (ref 70–99)
Glucose-Capillary: 138 mg/dL — ABNORMAL HIGH (ref 70–99)
Glucose-Capillary: 139 mg/dL — ABNORMAL HIGH (ref 70–99)
Glucose-Capillary: 157 mg/dL — ABNORMAL HIGH (ref 70–99)
Glucose-Capillary: 187 mg/dL — ABNORMAL HIGH (ref 70–99)
Glucose-Capillary: 217 mg/dL — ABNORMAL HIGH (ref 70–99)

## 2021-02-21 LAB — BASIC METABOLIC PANEL
Anion gap: 5 (ref 5–15)
BUN: 16 mg/dL (ref 8–23)
CO2: 28 mmol/L (ref 22–32)
Calcium: 8.9 mg/dL (ref 8.9–10.3)
Chloride: 104 mmol/L (ref 98–111)
Creatinine, Ser: 0.52 mg/dL (ref 0.44–1.00)
GFR, Estimated: >60 mL/min (ref >60)
Glucose, Bld: 182 mg/dL — ABNORMAL HIGH (ref 70–99)
Potassium: 4.9 mmol/L (ref 3.5–5.1)
Sodium: 137 mmol/L (ref 135–145)

## 2021-02-21 NOTE — Progress Notes (Signed)
PROGRESS NOTE   HPI was taken from NP Dewaine Conger: Erica Mueller is a 70 year old female with a past medical history significant for hypertension, diabetes mellitus, schizophrenia, and depression who presented to [facility] ED on 02/07/2021 from [facility] due to altered mental status.   Patient currently remains altered and no family is available, therefore history is obtained from ED and nursing notes.  Per notes EMS was dispatched as she became acutely altered today.  No history of recent falls or injuries or other symptoms.   Of note she was recently admitted from 11/07/2020 through 11/11/2020 for sepsis and acute metabolic encephalopathy due to urinary tract infection.   ED Course: Upon arrival to the ED she would open her eyes and withdraw from pain, but otherwise unable to participate in interview or exam.  She appeared extremely dehydrated and was tachycardic, tachypneic, and hypoxic with O2 sats 91% on room air. Initial vital signs: Temperature 98.4, RR 44, pulse 127, blood pressure 100/67, SPO2 91% on room air Labs: Sodium 166, chloride 126, glucose 360, BUN 88, creatinine 1.42, lactic acid 2.3, procalcitonin 0.12, WBC 17.2 with neutrophilia, D-dimer greater than 20 Venous blood gas: pH 7.44/PCO2 42/PO2 54/bicarb 20.5 Urinalysis consistent with UTI Positive for COVID-19   As per Dr. Loleta Books: Erica Mueller is a 70 y.o. F with CAD, DM, HTN, dementia, and schizophrenia, lives in [place] since May 2022 UTI sepsis who presented with acute somnolence for 1 day.   At baseline, patient able to ambluate and answer questions with one word answers but required assistance to bathe, dress, toilet and not able to engage in conversation.  Per family, she tested positive for COVID "2 weeks ago" and has steadily declined since then.  On the day of admission, she was found at her facility to be somnolent, sent to ER.   In the ER, tachycardic to 120s, tachypneic to 40s, hypotensive initially, GCS 5.  Na 166. Glu  260, Cr 1.4 up from baseline 0.5, Lactate 2.3.  UA suggestive of UTI, CXR clear. Started on fluids, antibiotics and admitted to ICU.     8/30: Admitted to ICU, nephrology consulted 8/31: Transferred to stepdown  9/2: NG tube placed 9/6: No appreciable change or improvement in mentation since admission   Hospital course from Dr. Jimmye Norman 9/7-9/13/22: Pt has had no appreciable change or improvement in mentation this week. NG tube was placed on 9/2 and if at 2 weeks pt still has no improvement then PEG tube should be ordered. Pt's son, LC, is agreeable. LC does not live locally and if consent cannot be signed electronically then he will make arrangements for another family to sign instead.    Erica Mueller  IWP:[id number] DOB: [birth year] DOA: 02/07/2021 PCP: [facility]   Assessment & Plan:   Active Problems:   Sepsis ([place])   Pressure injury of skin   Protein-calorie malnutrition, severe  Failure to thrive: secondary to all below. Poor prognosis. Will continue w/ care and full code as per pt's son  Dysphagia: continue w/ NG tube and tube feeds. NG tube placed on 9/2. Will continue w/ NG tube for 2 weeks to see if there is any improvement, if not will have PEG tubed placed. Pt's son was previously agreeable to placement of PEG tube   Severe sepsis: likely due to aspiration pneumonia. Met criteria w/ tachycardia, tachypnea, encephalopathy, & pneumonia. Initially thought to be UTI, but culture negative, further chest imaging showed consolidation in RUL/RLL consistent with aspiration.  Completed abx course.  Resolved   Aspiration pneumonia: completed abx course. Continue on bronchodilators    Hypernatremia: resolved    Hypokalemia: WNL   Hypophosphatemia: resolved   Transaminitis: resolved.   COVID19 infection: diagnosed 8/18, had no symptoms.  Well outside isolation period.   Acute on chronic metabolic encephalopathy: unchanged this entire week. Dependent on feeding tube for  fluids & nutrition. Will likely remain bedbound, nonverbal and not following commands  Cardiomyopathy: w/ elevated tropoins. Echo 8/30 showed reduced EF, new finding, without symptoms of CHF.  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 Unlikely a candidate for any invasive treatment/tests  DM2: likely poorly controlled. Continue on glargine, SSI w/ accuchecks    CAD: continue on statin, BB   HLD:  continue on statin   HTN: continue on metoprolol   Schizophrenia: continue to hold home anti-psychotics until mentation is better. Possible dementia.   Nonsustained VT: asymptomatic, hemodynamically stable.    AKI: resolved   Stage II pressure injury sacrum: present on admission. Continue w/ wound care    Thrombocytopenia: resolved    Severe protein calorie malnutrition: continue w/ tube feeds via NG tube. Will likely need a PEG tube placed   DVT prophylaxis: heparin  Code Status:  full  Family Communication: discussed pt's care w/ pt's son, LC and answered his questions  Disposition Plan:  [facility]  Level of care: Med-Surg  Status is: Inpatient  Remains inpatient appropriate because:Altered mental status, Unsafe d/c plan, IV treatments appropriate due to intensity of illness or inability to take PO, and Inpatient level of care appropriate due to severity of illness, if pt continues to be unable to eat/swallow, pt will need a PEG tube prior to d/c  Dispo: The patient is from: Home              Anticipated d/c is to: [facility]              Patient currently is medically unstable for d/c   Difficult to place patient : unclear   Consultants:  Palliative care  Procedures:   Antimicrobials:   Subjective: Pt is unchanged from day prior. Nonverbal, bedbound & dependent on tube feeds  Objective: Vitals:   02/20/21 1509 02/20/21 1949 02/21/21 0218 02/21/21 0358  BP: (!) 118/44 (!) 141/50 (!) 163/53   Pulse: 81 81 88   Resp: _0 Temp: (!)  97.1 F (36.2 C) 97.9 F (36.6 C) 98 F (36.7 C)   TempSrc:      SpO2: 99% 99% 100%   Weight:    51.5 kg  Height:       No intake or output data in the 24 hours ending 02/21/21 0756  Filed Weights   02/19/21 0500 02/20/21 0500 02/21/21 0358  Weight: 50.8 kg 50 kg 51.5 kg    Examination:  General exam: Appears comfortable. Appears older than stated  Respiratory system: Clear breath sounds b/l  Cardiovascular system: S1 & S2+. No rubs or clicks  Gastrointestinal system: Abd is soft, NT, ND & normal bowel osunds  Central nervous system: Awake but nonverbal. Moves all extremities   Psychiatry: Judgement and insight appear abnormal. Flat mood and affect    Data Reviewed: I have personally reviewed following labs and imaging studies  CBC: Recent Labs  Lab 02/17/21 0431 02/18/21 0417 02/19/21 0407 02/20/21 0640 02/21/21 0448  WBC 6.8 4.7 4.4 5.2 5.2  HGB 11.0* 11.1* 10.9* 10.1* 10.1*  HCT 34.5* 34.7* 34.7* 31.2* 32.3*  MCV 91.8 92.8  93.8 94.0 95.6  PLT 235 258 238 233 524   Basic Metabolic Panel: Recent Labs  Lab 02/17/21 0431 02/18/21 0417 02/19/21 0407 02/20/21 0640 02/21/21 0448  NA 134* 136 138 135 137  K 4.4 4.6 4.8 4.6 4.9  CL 99 102 103 102 104  CO2 _0 GLUCOSE 197* 220* 162* 216* 182*  BUN _1 CREATININE 0.44 0.38* 0.56 0.46 0.52  CALCIUM 8.8* 8.8* 8.7* 8.5* 8.9   GFR: Estimated Creatinine Clearance: 51.8 mL/min (by C-G formula based on SCr of 0.52 mg/dL). Liver Function Tests: Recent Labs  Lab 02/15/21 0406 02/16/21 0433 02/17/21 0431 02/18/21 0417 02/19/21 0407  AST 47* 55* 29 36 33  ALT 50* 60* 45* 46* 44  ALKPHOS 47 47 49 44 44  BILITOT 0.3 0.5 0.4 0.3 0.4  PROT 5.5* 6.7 6.2* 6.2* 6.0*  ALBUMIN 2.1* 2.6* 2.4* 2.4* 2.4*   No results for input(s): LIPASE, AMYLASE in the last 168 hours. No results for input(s): AMMONIA in the last 168 hours. Coagulation Profile: No results for input(s): INR, PROTIME in the last  168 hours. Cardiac Enzymes: No results for input(s): CKTOTAL, CKMB, CKMBINDEX, TROPONINI in the last 168 hours. BNP (last 3 results) No results for input(s): PROBNP in the last 8760 hours. HbA1C: No results for input(s): HGBA1C in the last 72 hours. CBG: Recent Labs  Lab 02/20/21 1209 02/20/21 1552 02/20/21 2052 02/20/21 2359 02/21/21 0519  GLUCAP 171* 129* 217* 137* 217*   Lipid Profile: No results for input(s): CHOL, HDL, LDLCALC, TRIG, CHOLHDL, LDLDIRECT in the last 72 hours. Thyroid Function Tests: No results for input(s): TSH, T4TOTAL, FREET4, T3FREE, THYROIDAB in the last 72 hours. Anemia Panel: No results for input(s): VITAMINB12, FOLATE, FERRITIN, TIBC, IRON, RETICCTPCT in the last 72 hours. Sepsis Labs: No results for input(s): PROCALCITON, LATICACIDVEN in the last 168 hours.   No results found for this or any previous visit (from the past 240 hour(s)).        Radiology Studies: No results found.      Scheduled Meds:  vitamin C  500 mg Per Tube Daily   chlorhexidine  15 mL Mouth Rinse BID   Chlorhexidine Gluconate Cloth  6 each Topical Daily   free water  100 mL Per Tube Q4H   heparin  5,000 Units Subcutaneous Q8H   insulin aspart  0-9 Units Subcutaneous Q4H   insulin aspart  3 Units Subcutaneous Q4H   insulin glargine-yfgn  14 Units Subcutaneous Daily   mouth rinse  15 mL Mouth Rinse q12n4p   metoprolol tartrate  25 mg Per NG tube BID   pravastatin  40 mg Per NG tube q1800   Continuous Infusions:  feeding supplement (OSMOLITE 1.2 CAL) 1,000 mL (02/16/21 2211)     LOS: 14 days    Time spent: 15 mins     Wyvonnia Dusky, MD [facility] Pager [phone]  If 7PM-7AM, please contact night-coverage 02/21/2021, 7:56 AM

## 2021-02-21 NOTE — Plan of Care (Signed)
No acute events overnight. Problem: Clinical Measurements: Goal: Ability to maintain clinical measurements within normal limits will improve Outcome: Progressing Goal: Will remain free from infection Outcome: Progressing Goal: Diagnostic test results will improve Outcome: Progressing Goal: Respiratory complications will improve Outcome: Progressing Goal: Cardiovascular complication will be avoided Outcome: Progressing   Problem: Nutrition: Goal: Adequate nutrition will be maintained Outcome: Progressing   Problem: Coping: Goal: Level of anxiety will decrease Outcome: Progressing   Problem: Elimination: Goal: Will not experience complications related to bowel motility Outcome: Progressing Goal: Will not experience complications related to urinary retention Outcome: Progressing   Problem: Pain Managment: Goal: General experience of comfort will improve Outcome: Progressing   Problem: Safety: Goal: Ability to remain free from injury will improve Outcome: Progressing   Problem: Skin Integrity: Goal: Risk for impaired skin integrity will decrease Outcome: Progressing   Problem: Respiratory: Goal: Will maintain a patent airway Outcome: Progressing Goal: Complications related to the disease process, condition or treatment will be avoided or minimized Outcome: Progressing

## 2021-02-22 DIAGNOSIS — R627 Adult failure to thrive: Secondary | ICD-10-CM | POA: Diagnosis not present

## 2021-02-22 DIAGNOSIS — F202 Catatonic schizophrenia: Secondary | ICD-10-CM

## 2021-02-22 DIAGNOSIS — A419 Sepsis, unspecified organism: Secondary | ICD-10-CM | POA: Diagnosis not present

## 2021-02-22 DIAGNOSIS — E870 Hyperosmolality and hypernatremia: Secondary | ICD-10-CM

## 2021-02-22 DIAGNOSIS — E1169 Type 2 diabetes mellitus with other specified complication: Secondary | ICD-10-CM

## 2021-02-22 DIAGNOSIS — E43 Unspecified severe protein-calorie malnutrition: Secondary | ICD-10-CM

## 2021-02-22 DIAGNOSIS — E876 Hypokalemia: Secondary | ICD-10-CM

## 2021-02-22 DIAGNOSIS — F061 Catatonic disorder due to known physiological condition: Secondary | ICD-10-CM

## 2021-02-22 DIAGNOSIS — E785 Hyperlipidemia, unspecified: Secondary | ICD-10-CM

## 2021-02-22 LAB — GLUCOSE, CAPILLARY
Glucose-Capillary: 105 mg/dL — ABNORMAL HIGH (ref 70–99)
Glucose-Capillary: 135 mg/dL — ABNORMAL HIGH (ref 70–99)
Glucose-Capillary: 171 mg/dL — ABNORMAL HIGH (ref 70–99)
Glucose-Capillary: 171 mg/dL — ABNORMAL HIGH (ref 70–99)
Glucose-Capillary: 199 mg/dL — ABNORMAL HIGH (ref 70–99)
Glucose-Capillary: 283 mg/dL — ABNORMAL HIGH (ref 70–99)

## 2021-02-22 LAB — CBC
HCT: 32.3 % — ABNORMAL LOW (ref 36.0–46.0)
Hemoglobin: 10.5 g/dL — ABNORMAL LOW (ref 12.0–15.0)
MCH: 31 pg (ref 26.0–34.0)
MCHC: 32.5 g/dL (ref 30.0–36.0)
MCV: 95.3 fL (ref 80.0–100.0)
Platelets: 233 10*3/uL (ref 150–400)
RBC: 3.39 MIL/uL — ABNORMAL LOW (ref 3.87–5.11)
RDW: 15.4 % (ref 11.5–15.5)
WBC: 4.4 10*3/uL (ref 4.0–10.5)
nRBC: 0 % (ref 0.0–0.2)

## 2021-02-22 LAB — BASIC METABOLIC PANEL
Anion gap: 5 (ref 5–15)
BUN: 15 mg/dL (ref 8–23)
CO2: 27 mmol/L (ref 22–32)
Calcium: 8.8 mg/dL — ABNORMAL LOW (ref 8.9–10.3)
Chloride: 104 mmol/L (ref 98–111)
Creatinine, Ser: 0.52 mg/dL (ref 0.44–1.00)
GFR, Estimated: >60 mL/min (ref >60)
Glucose, Bld: 131 mg/dL — ABNORMAL HIGH (ref 70–99)
Potassium: 4.7 mmol/L (ref 3.5–5.1)
Sodium: 136 mmol/L (ref 135–145)

## 2021-02-22 MED ORDER — THIOTHIXENE 2 MG PO CAPS
2.0000 mg | ORAL_CAPSULE | Freq: Two times a day (BID) | ORAL | Status: DC
  Administered 2021-02-22 – 2021-03-29 (×64): 2 mg
  Filled 2021-02-22 (×71): qty 1

## 2021-02-22 MED ORDER — DIAZEPAM 5 MG/ML IJ SOLN
2.5000 mg | Freq: Three times a day (TID) | INTRAMUSCULAR | Status: DC
  Administered 2021-02-22 – 2021-02-24 (×5): 2.5 mg via INTRAVENOUS
  Filled 2021-02-22 (×5): qty 2

## 2021-02-22 NOTE — Consult Note (Signed)
[facility] Face-to-Face Psychiatry Consult   Reason for Consult: Consult for 70 year old woman with a history of schizophrenia who has been in an extended hospitalization after recovering from COVID sepsis continues to be catatonic Referring Physician:  Hilton Sinclair Patient Identification: Erica Mueller MRN:  [id number] Principal Diagnosis: <principal problem not specified> Diagnosis:  Active Problems:   Type 2 diabetes mellitus with hyperlipidemia (HCC)   Schizophrenia (HCC)   Sepsis (HCC)   Pressure injury of skin   Protein-calorie malnutrition, severe   Failure to thrive in adult   Hypernatremia   Hypokalemia   Total Time spent with patient: 1 hour  Subjective:   Erica Mueller is a 70 y.o. female patient admitted with patient not responsive.  HPI: Patient seen chart reviewed.  70 year old woman with a known history of schizophrenia.  She has been in the hospital since the end of August after presenting with sepsis from COVID.  Gradually recovered and got out of the ICU but has remained unresponsive for the most part and is not eating.  Currently on a feeding tube.  Came to see patient eyes were open.  Clearly she was aware we were in the room but would not engage.  Would not follow any commands at all.  Would not speak in response to any questions.  Nursing reports that this is all they have gotten out of her recently as well.  Tried moving her arms a little bit.  Very stiff some hint of possible waxy flexibility.  Patient does not appear to be currently receiving antipsychotic medication.  Past Psychiatric History: Past history of schizophrenia.  Last known hospitalization 2019 when she was agitated and hostile disorganized and paranoid.  Looking at the other notes since then it sounds like she is going downhill with her cognitive functioning and is now in a assisted living facility  Risk to Self:   Risk to Others:   Prior Inpatient Therapy:   Prior Outpatient Therapy:    Past Medical  History:  Past Medical History:  Diagnosis Date   Coronary artery disease    Depression    Diabetes mellitus without complication (HCC)    Elevated lipids    GERD (gastroesophageal reflux disease)    Hypertension    Schizophrenia (HCC)    Syphilis (acquired)     Past Surgical History:  Procedure Laterality Date   COLONOSCOPY     COLONOSCOPY WITH PROPOFOL N/A 09/01/2015   Procedure: COLONOSCOPY WITH PROPOFOL;  Surgeon: Wallace Cullens, MD;  Location: [facility] ENDOSCOPY;  Service: Gastroenterology;  Laterality: N/A;   TUBAL LIGATION     wisdom teeth removal     Family History:  Family History  Problem Relation Age of Onset   Cancer Mother    Alcoholism Mother    Bone cancer Father    Heart disease Father    Diabetes Maternal Grandmother    Suicidality Paternal Uncle    Breast cancer Neg Hx    Family Psychiatric  History: Positive see above Social History:  Social History   Substance and Sexual Activity  Alcohol Use Yes   Alcohol/week: 3.0 standard drinks   Types: 2 Cans of beer, 1 Shots of liquor per week     Social History   Substance and Sexual Activity  Drug Use Not Currently   Types: Marijuana   Comment: PAST     Social History   Socioeconomic History   Marital status: Divorced    Spouse name: Not on file   Number of children:  Not on file   Years of education: Not on file   Highest education level: Not on file  Occupational History   Not on file  Tobacco Use   Smoking status: Every Day    Packs/day: 1.00    Years: 48.00    Pack years: 48.00    Types: Cigarettes    Start date: 12/20/1969   Smokeless tobacco: Never  Vaping Use   Vaping Use: Never used  Substance and Sexual Activity   Alcohol use: Yes    Alcohol/week: 3.0 standard drinks    Types: 2 Cans of beer, 1 Shots of liquor per week   Drug use: Not Currently    Types: Marijuana    Comment: PAST    Sexual activity: Not Currently    Birth control/protection: None  Other Topics Concern   Not on file   Social History Narrative   Not on file   Social Determinants of Health   Financial Resource Strain: Not on file  Food Insecurity: Not on file  Transportation Needs: Not on file  Physical Activity: Not on file  Stress: Not on file  Social Connections: Not on file   Additional Social History:    Allergies:  No Known Allergies  Labs:  Results for orders placed or performed during the hospital encounter of 02/07/21 (from the past 48 hour(s))  Glucose, capillary     Status: Abnormal   Collection Time: 02/20/21  8:52 PM  Result Value Ref Range   Glucose-Capillary 217 (H) 70 - 99 mg/dL    Comment: Glucose reference range applies only to samples taken after fasting for at least 8 hours.   Comment 1 Notify RN   Glucose, capillary     Status: Abnormal   Collection Time: 02/20/21 11:59 PM  Result Value Ref Range   Glucose-Capillary 137 (H) 70 - 99 mg/dL    Comment: Glucose reference range applies only to samples taken after fasting for at least 8 hours.   Comment 1 Notify RN   Basic metabolic panel     Status: Abnormal   Collection Time: 02/21/21  4:48 AM  Result Value Ref Range   Sodium 137 135 - 145 mmol/L   Potassium 4.9 3.5 - 5.1 mmol/L   Chloride 104 98 - 111 mmol/L   CO2 28 22 - 32 mmol/L   Glucose, Bld 182 (H) 70 - 99 mg/dL    Comment: Glucose reference range applies only to samples taken after fasting for at least 8 hours.   BUN 16 8 - 23 mg/dL   Creatinine, Ser 8.29 0.44 - 1.00 mg/dL   Calcium 8.9 8.9 - 56.2 mg/dL   GFR, Estimated >13 >08 mL/min    Comment: (NOTE) Calculated using the CKD-EPI Creatinine Equation (2021)    Anion gap 5 5 - 15    Comment: Performed at [facility], [address] [place], [place] [address]  CBC     Status: Abnormal   Collection Time: 02/21/21  4:48 AM  Result Value Ref Range   WBC 5.2 4.0 - 10.5 K/uL   RBC 3.38 (L) 3.87 - 5.11 MIL/uL   Hemoglobin 10.1 (L) 12.0 - 15.0 g/dL   HCT 69.6 (L) 29.5 - 28.4 %   MCV 95.6 80.0 -  100.0 fL   MCH 29.9 26.0 - 34.0 pg   MCHC 31.3 30.0 - 36.0 g/dL   RDW 13.2 44.0 - 10.2 %   Platelets 231 150 - 400 K/uL   nRBC 0.0 0.0 -  0.2 %    Comment: Performed at [facility], [address] [place], [place] [address]  Glucose, capillary     Status: Abnormal   Collection Time: 02/21/21  5:19 AM  Result Value Ref Range   Glucose-Capillary 217 (H) 70 - 99 mg/dL    Comment: Glucose reference range applies only to samples taken after fasting for at least 8 hours.   Comment 1 Notify RN   Glucose, capillary     Status: Abnormal   Collection Time: 02/21/21  8:38 AM  Result Value Ref Range   Glucose-Capillary 157 (H) 70 - 99 mg/dL    Comment: Glucose reference range applies only to samples taken after fasting for at least 8 hours.  Glucose, capillary     Status: Abnormal   Collection Time: 02/21/21 11:30 AM  Result Value Ref Range   Glucose-Capillary 139 (H) 70 - 99 mg/dL    Comment: Glucose reference range applies only to samples taken after fasting for at least 8 hours.  Glucose, capillary     Status: Abnormal   Collection Time: 02/21/21  4:28 PM  Result Value Ref Range   Glucose-Capillary 187 (H) 70 - 99 mg/dL    Comment: Glucose reference range applies only to samples taken after fasting for at least 8 hours.  Glucose, capillary     Status: Abnormal   Collection Time: 02/21/21  8:01 PM  Result Value Ref Range   Glucose-Capillary 138 (H) 70 - 99 mg/dL    Comment: Glucose reference range applies only to samples taken after fasting for at least 8 hours.  Glucose, capillary     Status: Abnormal   Collection Time: 02/22/21 12:35 AM  Result Value Ref Range   Glucose-Capillary 105 (H) 70 - 99 mg/dL    Comment: Glucose reference range applies only to samples taken after fasting for at least 8 hours.  Glucose, capillary     Status: Abnormal   Collection Time: 02/22/21  4:07 AM  Result Value Ref Range   Glucose-Capillary 135 (H) 70 - 99 mg/dL    Comment: Glucose reference  range applies only to samples taken after fasting for at least 8 hours.  Basic metabolic panel     Status: Abnormal   Collection Time: 02/22/21  5:41 AM  Result Value Ref Range   Sodium 136 135 - 145 mmol/L   Potassium 4.7 3.5 - 5.1 mmol/L   Chloride 104 98 - 111 mmol/L   CO2 27 22 - 32 mmol/L   Glucose, Bld 131 (H) 70 - 99 mg/dL    Comment: Glucose reference range applies only to samples taken after fasting for at least 8 hours.   BUN 15 8 - 23 mg/dL   Creatinine, Ser 6.56 0.44 - 1.00 mg/dL   Calcium 8.8 (L) 8.9 - 10.3 mg/dL   GFR, Estimated >81 >27 mL/min    Comment: (NOTE) Calculated using the CKD-EPI Creatinine Equation (2021)    Anion gap 5 5 - 15    Comment: Performed at [facility], [address] [place], [place] [address]  CBC     Status: Abnormal   Collection Time: 02/22/21  5:41 AM  Result Value Ref Range   WBC 4.4 4.0 - 10.5 K/uL   RBC 3.39 (L) 3.87 - 5.11 MIL/uL   Hemoglobin 10.5 (L) 12.0 - 15.0 g/dL   HCT 17.4 (L) 94.4 - 96.7 %   MCV 95.3 80.0 - 100.0 fL   MCH 31.0 26.0 - 34.0 pg  MCHC 32.5 30.0 - 36.0 g/dL   RDW 95.1 88.4 - 16.6 %   Platelets 233 150 - 400 K/uL   nRBC 0.0 0.0 - 0.2 %    Comment: Performed at [facility], [address] [place], [place] [address]  Glucose, capillary     Status: Abnormal   Collection Time: 02/22/21  7:53 AM  Result Value Ref Range   Glucose-Capillary 171 (H) 70 - 99 mg/dL    Comment: Glucose reference range applies only to samples taken after fasting for at least 8 hours.  Glucose, capillary     Status: Abnormal   Collection Time: 02/22/21 12:06 PM  Result Value Ref Range   Glucose-Capillary 199 (H) 70 - 99 mg/dL    Comment: Glucose reference range applies only to samples taken after fasting for at least 8 hours.   Comment 1 Notify RN    Comment 2 Document in Chart   Glucose, capillary     Status: Abnormal   Collection Time: 02/22/21  3:50 PM  Result Value Ref Range   Glucose-Capillary 283 (H) 70 -  99 mg/dL    Comment: Glucose reference range applies only to samples taken after fasting for at least 8 hours.    Current Facility-Administered Medications  Medication Dose Route Frequency Provider Last Rate Last Admin   acetaminophen (TYLENOL) suppository 650 mg  650 mg Rectal Q4H PRN Jimmye Norman, NP       acetaminophen (TYLENOL) tablet 650 mg  650 mg Oral Q4H PRN Harlon Ditty D, NP       ascorbic acid (VITAMIN C) tablet 500 mg  500 mg Per Tube Daily Charise Killian, MD   500 mg at 02/22/21 1004   chlorhexidine (PERIDEX) 0.12 % solution 15 mL  15 mL Mouth Rinse BID Alberteen Sam, MD   15 mL at 02/22/21 1004   Chlorhexidine Gluconate Cloth 2 % PADS 6 each  6 each Topical Daily Erin Fulling, MD   6 each at 02/22/21 1005   diazepam (VALIUM) injection 2.5 mg  2.5 mg Intravenous Q8H Cresencio Reesor T, MD       docusate sodium (COLACE) capsule 100 mg  100 mg Oral BID PRN Harlon Ditty D, NP       feeding supplement (OSMOLITE 1.2 CAL) liquid 1,000 mL  1,000 mL Per Tube Continuous Alford Highland, MD 55 mL/hr at 02/22/21 0032 1,000 mL at 02/22/21 0032   free water 100 mL  100 mL Per Tube Q4H Alford Highland, MD   100 mL at 02/22/21 1616   insulin aspart (novoLOG) injection 0-9 Units  0-9 Units Subcutaneous Q4H Charise Killian, MD   5 Units at 02/22/21 1616   insulin aspart (novoLOG) injection 3 Units  3 Units Subcutaneous Q4H Charise Killian, MD   3 Units at 02/22/21 1616   insulin glargine-yfgn (SEMGLEE) injection 14 Units  14 Units Subcutaneous Daily Alberteen Sam, MD   14 Units at 02/22/21 1005   MEDLINE mouth rinse  15 mL Mouth Rinse q12n4p Danford, Earl Lites, MD   15 mL at 02/22/21 1617   metoprolol tartrate (LOPRESSOR) tablet 25 mg  25 mg Per NG tube BID Charise Killian, MD   25 mg at 02/22/21 1004   morphine 2 MG/ML injection 2 mg  2 mg Intravenous Q4H PRN Jimmye Norman, NP       ondansetron [facility]) injection 4 mg  4 mg  Intravenous Q6H PRN Judithe Modest, NP  polyethylene glycol (MIRALAX / GLYCOLAX) packet 17 g  17 g Oral Daily PRN Judithe Modest, NP       thiothixene (NAVANE) capsule 2 mg  2 mg Per Tube BID Jolyssa Oplinger, Jackquline Denmark, MD        Musculoskeletal: Strength & Muscle Tone: spastic Gait & Station: unable to stand Patient leans: N/A            Psychiatric Specialty Exam:  Presentation  General Appearance:  No data recorded Eye Contact: No data recorded Speech: No data recorded Speech Volume: No data recorded Handedness: No data recorded  Mood and Affect  Mood: No data recorded Affect: No data recorded  Thought Process  Thought Processes: No data recorded Descriptions of Associations:No data recorded Orientation:No data recorded Thought Content:No data recorded History of Schizophrenia/Schizoaffective disorder:No data recorded Duration of Psychotic Symptoms:No data recorded Hallucinations:No data recorded Ideas of Reference:No data recorded Suicidal Thoughts:No data recorded Homicidal Thoughts:No data recorded  Sensorium  Memory: No data recorded Judgment: No data recorded Insight: No data recorded  Executive Functions  Concentration: No data recorded Attention Span: No data recorded Recall: No data recorded Fund of Knowledge: No data recorded Language: No data recorded  Psychomotor Activity  Psychomotor Activity: No data recorded  Assets  Assets: No data recorded  Sleep  Sleep: No data recorded  Physical Exam: Physical Exam Vitals and nursing note reviewed.  Constitutional:      Appearance: She is ill-appearing.  HENT:     Head: Normocephalic and atraumatic.     Mouth/Throat:     Pharynx: Oropharynx is clear.  Eyes:     Pupils: Pupils are equal, round, and reactive to light.  Cardiovascular:     Rate and Rhythm: Normal rate and regular rhythm.  Pulmonary:     Effort: Pulmonary effort is normal.     Breath sounds: Normal  breath sounds.  Abdominal:     General: Abdomen is flat.     Palpations: Abdomen is soft.  Musculoskeletal:        General: Normal range of motion.  Skin:    General: Skin is warm and dry.  Neurological:     General: No focal deficit present.     Mental Status: Mental status is at baseline.  Psychiatric:        Attention and Perception: She is inattentive.        Mood and Affect: Mood normal. Affect is blunt.        Speech: She is noncommunicative.        Behavior: Behavior is withdrawn.        Cognition and Memory: Cognition is impaired.   Review of Systems  Unable to perform ROS: Patient nonverbal  Blood pressure (!) 160/60, pulse 93, temperature 98.1 F (36.7 C), temperature source Oral, resp. rate 16, height 5\' 2"  (1.575 m), weight 51.5 kg, SpO2 100 %. Body mass index is 20.77 kg/m.  Treatment Plan Summary: Medication management and Plan patient with a history of schizophrenia currently appears to be catatonic.  Eyes are open patient is awake but she is not responsive not verbal not following any commands.  Patient previously had been stabilized on antipsychotic medication at 1 time on Navane at another more recent time on long-acting Invega.  Currently not on any antipsychotic.  I have reordered Navane 2 mg twice a day to start with.  I have also put in an order for Valium 2.5 mg 3 times a day for catatonia.  Question would arise  than whether it would be worth trying to push for electroconvulsive therapy if patient does not respond.  Quality of life prior to hospitalization seems to have been fairly low.  If she does not get better and cannot be placed we will consider talking with the family about whether they would want to pursue ECT treatment.  We will follow for now.  Disposition:  see above  Mordecai Rasmussen, MD 02/22/2021 5:48 PM

## 2021-02-22 NOTE — Progress Notes (Signed)
Nutrition Follow-up  DOCUMENTATION CODES:  Severe malnutrition in context of social or environmental circumstances  INTERVENTION:  Continue NPO status with TF as pt is not alert enough to support her nutrition needs orally Recommend continuing enteral feeds via NGT once placement is confirmed. Recommend the following: Osmolite 1.2 @ 6mL/h (1.32L/d) free water q4h Regimen will provide 1584kcal, 73g of protein, and of free water (TF+flush) If family wishes to continue aggressive care, would recommend placement of PEG tube as pt's mentation has not improved despite medical treatment and will likely require long term enteral feeding to maintain nutrition, hydration, and weight. Will not be able to discharge to a facility with NGT in place.  NUTRITION DIAGNOSIS:  Severe Malnutrition related to social / environmental circumstances as evidenced by severe fat depletion, severe muscle depletion.  GOAL:  Patient will meet greater than or equal to 90% of their needs  MONITOR:  TF tolerance, Skin, Labs, I & O's  REASON FOR ASSESSMENT:  Malnutrition Screening Tool    ASSESSMENT:  70 y.o. female with medical history of HTN, DM, schizophrenia, depression, CAD, GERD, and HLD presented to ED from [facility] via EMS for AMS. Pt was found to be quite dehydrated in ED and UA concerning for infection.  Initially admitted to ICU but transferred to floor 9/1. Pt has been with AMS and poor mentation since admission, has not been able to take in POs this admission. SLP evaluated and recommended pt stay NPO. Palliative care consulting and working with family on GOC.   Initial NGT placed 9/2 to provide nutrition. Despite receiving 100% of her nutrition needs, pt's mental status has not improved. Feeds have been given for 12 days, son previously stated he wanted to wait 2 weeks prior to PEG placement. Palliative care's last discussion with son was 9/8 and he indicated he wanted to continue  all modalities of care. Would recommend moving forward with consulting either IR or GI for PEG placement as it is HIGHLY unlikely any change will occur in the next 48 hours and pt will be unable to support her nutrition needs orally. Not interactive and no change in mentation. Pt will not be able to return to her SNF with NGT in place.   Pt is tolerating current regimen well and weight is stable. Will continue to monitor discussions around PEG placement and nutrition.   Nutritionally Relevant Medications: Scheduled Meds:  vitamin C  500 mg Per Tube Daily   free water  100 mL Per Tube Q4H   insulin aspart  0-9 Units Subcutaneous Q4H   insulin aspart  3 Units Subcutaneous Q4H   insulin glargine-yfgn  14 Units Subcutaneous Daily   pravastatin  40 mg Per NG tube q1800   Continuous Infusions:  feeding supplement (OSMOLITE 1.2 CAL) 1,000 mL (02/22/21 0032)   PRN Meds: docusate sodium, ondansetron, polyethylene glycol  Labs Reviewed: SBG ranges from 105-187 mg/dL over the last 24 hours HgbA1c: 7/9% (8/30)  NUTRITION - FOCUSED PHYSICAL EXAM: Flowsheet Row Most Recent Value  Orbital Region Moderate depletion  Upper Arm Region Severe depletion  Thoracic and Lumbar Region Severe depletion  Buccal Region Moderate depletion  Temple Region Moderate depletion  Clavicle Bone Region Moderate depletion  Clavicle and Acromion Bone Region Severe depletion  Scapular Bone Region Severe depletion  Dorsal Hand Moderate depletion  Patellar Region Severe depletion  Anterior Thigh Region Severe depletion  Posterior Calf Region Moderate depletion  Edema (RD Assessment) None  Hair Reviewed  Eyes Reviewed  Mouth Reviewed  Skin Reviewed  Nails Reviewed   Diet Order:   Diet Order             Diet NPO time specified  Diet effective now                  EDUCATION NEEDS:  No education needs have been identified at this time  Skin:  Skin Assessment: Skin Integrity Issues: Skin Integrity  Issues:: Stage II Stage II: sacrum  Last BM:  9/12 - type 5  Height:  Ht Readings from Last 1 Encounters:  02/07/21 5\' 2"  (1.575 m)   Weight:  Wt Readings from Last 1 Encounters:  02/21/21 51.5 kg   Ideal Body Weight:  50 kg  BMI:  Body mass index is 20.77 kg/m.  Estimated Nutritional Needs:  Kcal:  1500-1700 kcal/d Protein:  75-85 g/d Fluid:  1.5-1.8 L/d   02/23/21, RD, LDN Clinical Dietitian Pager on Amion

## 2021-02-22 NOTE — Progress Notes (Signed)
Patient ID: Erica Mueller, female   DOB: 04-Feb-1951, 70 y.o.   MRN: [id number] [facility] PROGRESS NOTE  Erica Mueller IDP:[id number] DOB: [birth year] DOA: 02/07/2021 PCP: [name]  HPI/Subjective: Patient opened her eyes and looked at me but unable to talk to me or move her arms or legs.  Patient has a history of schizophrenia.  In speaking with the patient's son, he states that she is usually able to talk.  He would like to proceed with a feeding tube.  Initially sent in with altered mental status.  Objective: Vitals:   02/22/21 0504 02/22/21 0757  BP: (!) 154/59 (!) 156/53  Pulse: 90 98  Resp: 18 16  Temp: 98.1 F (36.7 C) 98.3 F (36.8 C)  SpO2:  95%   No intake or output data in the 24 hours ending 02/22/21 1515 Filed Weights   02/19/21 0500 02/20/21 0500 02/21/21 0358  Weight: 50.8 kg 50 kg 51.5 kg    ROS: Review of Systems  Unable to perform ROS: Acuity of condition  Exam: Physical Exam HENT:     Head: Normocephalic.     Mouth/Throat:     Pharynx: No oropharyngeal exudate.  Eyes:     General: Lids are normal.     Conjunctiva/sclera: Conjunctivae normal.     Pupils: Pupils are equal, round, and reactive to light.  Cardiovascular:     Rate and Rhythm: Normal rate and regular rhythm.     Heart sounds: Normal heart sounds, S1 normal and S2 normal.  Pulmonary:     Breath sounds: Normal breath sounds. No decreased breath sounds, wheezing, rhonchi or rales.  Abdominal:     Palpations: Abdomen is soft.     Tenderness: There is no abdominal tenderness.  Musculoskeletal:     Right lower leg: No swelling.     Left lower leg: No swelling.  Skin:    General: Skin is warm.     Findings: No rash.  Neurological:     Mental Status: She is alert.     Comments: Unable to talk or follow commands.      Scheduled Meds:  vitamin C  500 mg Per Tube Daily   chlorhexidine  15 mL Mouth Rinse BID   Chlorhexidine Gluconate Cloth  6 each Topical Daily   free  water  100 mL Per Tube Q4H   heparin  5,000 Units Subcutaneous Q8H   insulin aspart  0-9 Units Subcutaneous Q4H   insulin aspart  3 Units Subcutaneous Q4H   insulin glargine-yfgn  14 Units Subcutaneous Daily   mouth rinse  15 mL Mouth Rinse q12n4p   metoprolol tartrate  25 mg Per NG tube BID   pravastatin  40 mg Per NG tube q1800   Continuous Infusions:  feeding supplement (OSMOLITE 1.2 CAL) 1,000 mL (02/22/21 0032)    Assessment/Plan:  Failure to thrive.  With the patient's mental status she will not be able to keep up with her nutritional needs currently.  She has an NG tube receiving feeds.  Son interested in progressing to a PEG feeding tube.  We will contact interventional radiology. Catatonic with history of schizophrenia.  We will get psychiatric consultation.  We will send off an RPR and vitamin B12.  TSH normal range Severe sepsis, present on admission with aspiration pneumonia.  Completed antibiotics Hypernatremia and hypokalemia and hypophosphatemia all improved into the normal range Transaminitis resolved into the normal range. Cardiomyopathy Type 2 diabetes with hyperlipidemia.  Discontinue pravastatin since it  may be affecting mental status.  Continue glargine insulin. Decubitus ulcer sacrum, present on admission see description below Nonsustained ventricular tachycardia asymptomatic likely electrolyte abnormalities Acute kidney injury resolved with IV fluids Thrombocytopenia improved Severe protein calorie malnutrition on NG tube feeds.  Pressure Injury 02/08/21 Sacrum Medial Stage 2 -  Partial thickness loss of dermis presenting as a shallow open injury with a red, pink wound bed without slough. dry (Active)  02/08/21 0700  Location: Sacrum  Location Orientation: Medial  Staging: Stage 2 -  Partial thickness loss of dermis presenting as a shallow open injury with a red, pink wound bed without slough.  Wound Description (Comments): dry  Present on Admission: Yes        Code Status:     Code Status Orders  (From admission, onward)           Start     Ordered   02/07/21 1806  Full code  Continuous        02/07/21 1809           Code Status History     Date Active Date Inactive Code Status Order ID Comments User Context   11/07/2020 2347 11/11/2020 2103 Full Code [id number]  Andris Baumann, [facility]   10/16/2017 0053 10/24/2017 1726 Full Code [id number]  Audery Amel, [facility]   05/27/2017 0024 05/30/2017 1929 Full Code [id number]  E[facility] Inpatient      Family Communication: Spoke with son on the phone Disposition Plan: Status is: Inpatient  Dispo: The patient is from: Rehab              Anticipated d/c is to: Rehab              Patient currently will need a way of feeding her long-term prior to getting back to her facility.  Contacting IR to see if they will be able to do a PEG tube   Difficult to place patient.  No.  Consultants: Psychiatry  Time spent: 27 minutes  Cesar [employer]

## 2021-02-22 NOTE — TOC Progression Note (Signed)
Transition of Care [facility]) - Progression Note    Patient Details  Name: Erica Mueller MRN: [id number] Date of Birth: [birth year]  [facility] CM/SW Contact  Barrie Dunker, RN Phone Number: 02/22/2021, 11:02 AM  Clinical Narrative:   The patient remains a full code, plan to wait 2 weeks after NG tube was placed to make determination to place a PEG tube, the patient is a long term resident at [facility] and will return at [place], unable to reach the son on the phone    Expected Discharge Plan: Long Term Nursing Home Barriers to Discharge: Continued Medical Work up  Expected Discharge Plan and Services Expected Discharge Plan: Long Term Nursing Home In-house Referral: Clinical Social Work   Post Acute Care Choice: Nursing Home Living arrangements for the past 2 months:  [facility] Facility)                                       Social Determinants of Health (SDOH) Interventions    Readmission Risk Interventions No flowsheet data found.

## 2021-02-23 ENCOUNTER — Inpatient Hospital Stay: Payer: Medicare Other | Admitting: Radiology

## 2021-02-23 ENCOUNTER — Encounter: Payer: Self-pay | Admitting: Family Medicine

## 2021-02-23 ENCOUNTER — Inpatient Hospital Stay: Payer: Medicare Other

## 2021-02-23 DIAGNOSIS — R627 Adult failure to thrive: Secondary | ICD-10-CM | POA: Diagnosis not present

## 2021-02-23 DIAGNOSIS — L89152 Pressure ulcer of sacral region, stage 2: Secondary | ICD-10-CM

## 2021-02-23 DIAGNOSIS — N179 Acute kidney failure, unspecified: Secondary | ICD-10-CM

## 2021-02-23 DIAGNOSIS — E11649 Type 2 diabetes mellitus with hypoglycemia without coma: Secondary | ICD-10-CM | POA: Diagnosis not present

## 2021-02-23 DIAGNOSIS — A419 Sepsis, unspecified organism: Secondary | ICD-10-CM | POA: Diagnosis not present

## 2021-02-23 DIAGNOSIS — F202 Catatonic schizophrenia: Secondary | ICD-10-CM | POA: Diagnosis not present

## 2021-02-23 HISTORY — PX: IR FLUORO RM 30-60 MIN: IMG2384

## 2021-02-23 LAB — GLUCOSE, CAPILLARY
Glucose-Capillary: 108 mg/dL — ABNORMAL HIGH (ref 70–99)
Glucose-Capillary: 121 mg/dL — ABNORMAL HIGH (ref 70–99)
Glucose-Capillary: 125 mg/dL — ABNORMAL HIGH (ref 70–99)
Glucose-Capillary: 165 mg/dL — ABNORMAL HIGH (ref 70–99)
Glucose-Capillary: 182 mg/dL — ABNORMAL HIGH (ref 70–99)
Glucose-Capillary: 56 mg/dL — ABNORMAL LOW (ref 70–99)
Glucose-Capillary: 77 mg/dL (ref 70–99)

## 2021-02-23 LAB — RPR: RPR Ser Ql: NONREACTIVE

## 2021-02-23 LAB — VITAMIN B12: Vitamin B-12: 626 pg/mL (ref 180–914)

## 2021-02-23 LAB — PROTIME-INR
INR: 1 (ref 0.8–1.2)
Prothrombin Time: 13.5 seconds (ref 11.4–15.2)

## 2021-02-23 MED ORDER — FENTANYL CITRATE (PF) 100 MCG/2ML IJ SOLN
INTRAMUSCULAR | Status: AC
  Filled 2021-02-23: qty 2

## 2021-02-23 MED ORDER — DEXTROSE 5 % IV SOLN: INTRAVENOUS | Status: DC

## 2021-02-23 MED ORDER — OSMOLITE 1.2 CAL PO LIQD
1000.0000 mL | ORAL | Status: AC
  Administered 2021-02-23: 1000 mL

## 2021-02-23 MED ORDER — INSULIN ASPART 100 UNIT/ML IJ SOLN: 3.0000 [IU] | INTRAMUSCULAR | Status: DC

## 2021-02-23 MED ORDER — DEXTROSE 50 % IV SOLN
INTRAVENOUS | Status: AC
  Administered 2021-02-23: 12.5 g via INTRAVENOUS
  Filled 2021-02-23: qty 50

## 2021-02-23 MED ORDER — FREE WATER
100.0000 mL | Status: DC
  Administered 2021-02-23 – 2021-02-28 (×25): 100 mL

## 2021-02-23 MED ORDER — MIDAZOLAM HCL 2 MG/2ML IJ SOLN
INTRAMUSCULAR | Status: AC
  Filled 2021-02-23: qty 2

## 2021-02-23 MED ORDER — DEXTROSE 50 % IV SOLN
12.5000 g | INTRAVENOUS | Status: AC
  Administered 2021-02-23: 12.5 g via INTRAVENOUS

## 2021-02-23 MED ORDER — CEFAZOLIN SODIUM-DEXTROSE 2-4 GM/100ML-% IV SOLN
2.0000 g | Freq: Once | INTRAVENOUS | Status: AC
  Administered 2021-02-23: 2 g via INTRAVENOUS
  Filled 2021-02-23: qty 100

## 2021-02-23 NOTE — Sedation Documentation (Signed)
Ancef stopped after 1 gram, md had to abort G tube insertion. Pt returned to room. No sedation was given.

## 2021-02-23 NOTE — Consult Note (Signed)
Psychiatry consult: Came by to see patient this afternoon but she was off to having her feeding tube placed.  We will continue to follow.

## 2021-02-23 NOTE — H&P (Addendum)
Chief Complaint: Malnutrition  Referring Physician(s): Alford Highland, MD  Supervising Physician: Richarda Overlie  Patient Status: [facility] - In-pt  History of Present Illness: Erica Mueller is a 70 y.o. female  with  medicall issues incluging history of schizophrenia, CAD, DM, and HTN.  She has been in the hospital since the end of August after presenting with sepsis from COVID.    Her recovery has been slow.   She has remained unresponsive and is not eating.    She has an NGT in place for tube feeds.  Upon calling her name she does open her eyey.  She does not engage.  She does not follow any commands at all.  She is non-verbal.  We are asked to place a gastrostomy tube so she can eventually return to a SNF.  Tube feeds held.  Past Medical History:  Diagnosis Date   Coronary artery disease    Depression    Diabetes mellitus without complication (HCC)    Elevated lipids    GERD (gastroesophageal reflux disease)    Hypertension    Schizophrenia (HCC)    Syphilis (acquired)     Past Surgical History:  Procedure Laterality Date   COLONOSCOPY     COLONOSCOPY WITH PROPOFOL N/A 09/01/2015   Procedure: COLONOSCOPY WITH PROPOFOL;  Surgeon: Wallace Cullens, MD;  Location: [facility] ENDOSCOPY;  Service: Gastroenterology;  Laterality: N/A;   TUBAL LIGATION     wisdom teeth removal      Allergies: Patient has no known allergies.  Medications: Prior to Admission medications   Medication Sig Start Date End Date Taking? Authorizing Provider  amitriptyline (ELAVIL) 25 MG tablet Take 1 tablet (25 mg total) by mouth at bedtime. 10/24/17  Yes McNew, Ileene Hutchinson, MD  chlorproMAZINE (THORAZINE) 25 MG tablet Take 25 mg by mouth 2 (two) times daily. 09/05/20  Yes [provider]  cholecalciferol (VITAMIN D3) 25 MCG (1000 UNIT) tablet Take 1,000 Units by mouth daily.   Yes [provider]  glimepiride (AMARYL) 2 MG tablet Take 2 mg by mouth daily. 09/01/20  Yes [provider]  lovastatin (MEVACOR) 40 MG tablet Take 1 tablet (40 mg total) by mouth at bedtime. 12/20/17  Yes Duanne Limerick, MD  metFORMIN (GLUCOPHAGE) 1000 MG tablet Take 1,000 mg by mouth 2 (two) times daily.   Yes [provider]  metoprolol tartrate (LOPRESSOR) 25 MG tablet Take 1 tablet (25 mg total) by mouth 2 (two) times daily. 11/11/20  Yes [name], MD  paliperidone (INVEGA) 6 MG 24 hr tablet Take 6 mg by mouth daily. 09/05/20  Yes [provider]  polyethylene glycol (MIRALAX / GLYCOLAX) 17 g packet Take 17 g by mouth daily.   Yes [provider]  senna-docusate (SENOKOT-S) 8.6-50 MG tablet Take 1 tablet by mouth at bedtime. 11/11/20  Yes [name], MD  senna-docusate (SENOKOT-S) 8.6-50 MG tablet Take 1 tablet by mouth 2 (two) times daily. (0800 and 1600)   Yes [provider]  thiothixene (NAVANE) 2 MG capsule Take 1 capsule (2 mg total) by mouth 2 (two) times daily. 10/24/17  Yes McNew, Ileene Hutchinson, MD  vitamin C (ASCORBIC ACID) 500 MG tablet Take 500 mg by mouth 2 (two) times daily.   Yes [provider]  zinc gluconate 50 MG tablet Take 50 mg by mouth daily.   Yes [provider]     Family History  Problem Relation Age of Onset   Cancer Mother  Alcoholism Mother    Bone cancer Father    Heart disease Father    Diabetes Maternal Grandmother    Suicidality Paternal Uncle    Breast cancer Neg Hx     Social History   Socioeconomic History   Marital status: Divorced    Spouse name: Not on file   Number of children: Not on file   Years of education: Not on file   Highest education level: Not on file  Occupational History   Not on file  Tobacco Use   Smoking status: Every Day    Packs/day: 1.00    Years: 48.00    Pack years: 48.00    Types: Cigarettes    Start date: 12/20/1969   Smokeless tobacco: Never  Vaping Use   Vaping Use: Never used  Substance and Sexual Activity   Alcohol use: Yes    Alcohol/week: 3.0  standard drinks    Types: 2 Cans of beer, 1 Shots of liquor per week   Drug use: Not Currently    Types: Marijuana    Comment: PAST    Sexual activity: Not Currently    Birth control/protection: None  Other Topics Concern   Not on file  Social History Narrative   Not on file   Social Determinants of Health   Financial Resource Strain: Not on file  Food Insecurity: Not on file  Transportation Needs: Not on file  Physical Activity: Not on file  Stress: Not on file  Social Connections: Not on file    Review of Systems  Unable to perform ROS: Mental status change   Vital Signs: BP (!) 150/68 (BP Location: Left Arm)   Pulse 98   Temp 97.6 F (36.4 C) (Axillary)   Resp 18   Ht 5\' 2"  (1.575 m)   Wt 51.5 kg   SpO2 96%   BMI 20.77 kg/m   Physical Exam Vitals reviewed.  HENT:     Mouth/Throat:     Comments: Large tongue, poor dentition.  Patient does not follow command to open mouth to evaluate airway. Cardiovascular:     Rate and Rhythm: Normal rate and regular rhythm.  Pulmonary:     Effort: Pulmonary effort is normal. No respiratory distress.     Breath sounds: Normal breath sounds.  Abdominal:     General: There is no distension.     Palpations: Abdomen is soft.     Tenderness: There is no abdominal tenderness.     Comments: No scars  Skin:    General: Skin is warm and dry.  Neurological:     Mental Status: She is disoriented.    Imaging: DG Abd 1 View  Result Date: 02/16/2021 CLINICAL DATA:  NG tube placement. EXAM: ABDOMEN - 1 VIEW COMPARISON:  02/16/2021 FINDINGS: The NG tube tip is in the body region of the stomach. The lung bases are grossly clear. IMPRESSION: NG tube tip is in the body region of the stomach. Electronically Signed   By: 04/18/2021 M.D.   On: 02/16/2021 06:15   DG Abd 1 View  Result Date: 02/16/2021 CLINICAL DATA:  70 year old female feeding tube placement. EXAM: ABDOMEN - 1 VIEW COMPARISON:  02/10/2021. FINDINGS: AP view of the  abdomen at 0408 hours. Enteric tube tip projects inside the stomach. The side hole is just above the GEJ. Stable lung bases. Visible bowel-gas pattern within normal limits. IMPRESSION: Enteric tube tip in the stomach with side hole just above the GEJ - advance 6 cm to  ensure side hole placement within the stomach. Electronically Signed   By: Odessa Fleming M.D.   On: 02/16/2021 04:53   CT HEAD WO CONTRAST ( )  Result Date: 02/07/2021 CLINICAL DATA:  Mental status change, unknown cause EXAM: CT HEAD WITHOUT CONTRAST TECHNIQUE: Contiguous axial images were obtained from the base of the skull through the vertex without intravenous contrast. COMPARISON:  12/14/2020 FINDINGS: Brain: No evidence of acute infarction, hemorrhage, hydrocephalus, extra-axial collection or mass lesion/mass effect. Mild-moderate low-density changes within the periventricular and subcortical white matter compatible with chronic microvascular ischemic change. Mild diffuse cerebral volume loss. Vascular: Atherosclerotic calcifications involving the large vessels of the skull base. No unexpected hyperdense vessel. Skull: Normal. Negative for fracture or focal lesion. Sinuses/Orbits: Right maxillary sinus mucosal thickening with air-fluid level. Partial opacification of the bilateral mastoid air cells. Other: None. IMPRESSION: 1. No acute intracranial findings. 2. Chronic microvascular ischemic change and cerebral volume loss. 3. Chronic right maxillary sinusitis. 4. Partial opacification of the bilateral mastoid air cells. Electronically Signed   By: Duanne Guess D.O.   On: 02/07/2021 16:40   CT Angio Chest Pulmonary Embolism (PE) W or WO Contrast  Result Date: 02/07/2021 CLINICAL DATA:  Altered mental status EXAM: CT ANGIOGRAPHY CHEST WITH CONTRAST TECHNIQUE: Multidetector CT imaging of the chest was performed using the standard protocol during bolus administration of intravenous contrast. Multiplanar CT image reconstructions and MIPs were  obtained to evaluate the vascular anatomy. CONTRAST:  32mL OMNIPAQUE IOHEXOL 350 MG/ML SOLN COMPARISON:  Chest x-ray 02/07/2021 FINDINGS: Cardiovascular: Slightly limited evaluation secondary to motion degradation. No definite acute pulmonary embolus is visualized. Aorta is nonaneurysmal. Moderate atherosclerosis. Coronary vascular calcification. Normal cardiac size. No pericardial effusion Mediastinum/Nodes: Midline trachea. No thyroid mass. No suspicious adenopathy. Esophagus within normal limits Lungs/Pleura: Emphysema. Partial consolidation within the posterior right upper lobe and the right lower lobe. No pleural effusion or pneumothorax. Upper Abdomen: No acute abnormality. Musculoskeletal: No chest wall abnormality. No acute or significant osseous findings. Review of the MIP images confirms the above findings. IMPRESSION: 1. No definite acute pulmonary embolus identified allowing for mild motion degradation. 2. Emphysema with partial consolidations in the right upper lobe and right lower lobe concerning for pneumonia and or aspiration. Aortic Atherosclerosis (ICD10-I70.0) and Emphysema (ICD10-J43.9). Electronically Signed   By: Jasmine Pang M.D.   On: 02/07/2021 23:22   MR BRAIN WO CONTRAST  Result Date: 02/12/2021 CLINICAL DATA:  Encephalopathy EXAM: MRI HEAD WITHOUT CONTRAST TECHNIQUE: Multiplanar, multiecho pulse sequences of the brain and surrounding structures were obtained without intravenous contrast. COMPARISON:  None. FINDINGS: Brain: No acute infarct, mass effect or extra-axial collection. No acute or chronic hemorrhage. There is multifocal hyperintense T2-weighted signal within the white matter. Generalized volume loss without a clear lobar predilection. 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 A partially empty sella is incidentally noted. Vascular: Major flow voids are preserved. Skull and upper cervical spine: Normal calvarium and skull base. Visualized upper cervical spine  and soft tissues are normal. Sinuses/Orbits:Small mastoid effusions and mild right maxillary sinus mucosal thickening. Normal orbits. IMPRESSION: 1. No acute intracranial abnormality. 2. Generalized volume loss and findings of chronic ischemic microangiopathy. 3. 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 Electronically Signed   By: Deatra Robinson M.D.   On: 02/12/2021 00:04   US Venous Img Lower Bilateral (DVT)  Result Date: 02/08/2021 CLINICAL DATA:  Bilateral lower extremity edema. EXAM: BILATERAL LOWER EXTREMITY VENOUS DOPPLER ULTRASOUND TECHNIQUE: Gray-scale sonography with compression, as well  as color and duplex ultrasound, were performed to evaluate the deep venous system(s) from the level of the common femoral vein through the popliteal and proximal calf veins. COMPARISON:  None. FINDINGS: VENOUS Normal compressibility of the common femoral, superficial femoral, and popliteal veins, as well as the visualized calf veins. Visualized portions of profunda femoral vein and great saphenous vein unremarkable. No filling defects to suggest DVT on grayscale or color Doppler imaging. Doppler waveforms show normal direction of venous flow, normal respiratory plasticity and response to augmentation. Limited views of the contralateral common femoral vein are unremarkable. OTHER None. Limitations: none IMPRESSION: Negative. Electronically Signed   By: Aram Candela M.D.   On: 02/08/2021 00:43   US Venous Img Upper Uni Right(DVT)  Result Date: 02/18/2021 CLINICAL DATA:  Right upper extremity edema EXAM: RIGHT UPPER EXTREMITY VENOUS DOPPLER ULTRASOUND TECHNIQUE: Gray-scale sonography with graded compression, as well as color Doppler and duplex ultrasound were performed to evaluate the upper extremity deep venous system from the level of the subclavian vein and including the jugular, axillary, basilic, radial, ulnar and upper cephalic vein. Spectral Doppler was utilized to evaluate flow at rest and  with distal augmentation maneuvers. COMPARISON:  None. FINDINGS: Contralateral Subclavian Vein: Respiratory phasicity is normal and symmetric with the symptomatic side. No evidence of thrombus. Normal compressibility. Internal Jugular Vein: No evidence of thrombus. Normal compressibility, respiratory phasicity and response to augmentation. Subclavian Vein: No evidence of thrombus. Normal compressibility, respiratory phasicity and response to augmentation. Axillary Vein: No evidence of thrombus. Normal compressibility, respiratory phasicity and response to augmentation. Cephalic Vein: The cephalic vein is not compressible beginning in the distal upper arm and extending into the forearm. The lumen is expanded and filled with low-level internal echoes. No evidence of color flow on color Doppler imaging. Findings are consistent with occlusive superficial thrombosis. Basilic Vein: No evidence of thrombus. Normal compressibility, respiratory phasicity and response to augmentation. Brachial Veins: No evidence of thrombus. Normal compressibility, respiratory phasicity and response to augmentation. Radial Veins: No evidence of thrombus. Normal compressibility, respiratory phasicity and response to augmentation. Ulnar Veins: No evidence of thrombus. Normal compressibility, respiratory phasicity and response to augmentation. Venous Reflux:  None visualized. Other Findings: Indeterminate complex and heterogeneously echogenic structure within the superficial soft tissues of the right axilla measures 1.5 x 0.7 x 2.5 cm. There is posterior acoustic enhancement suggesting that this is a complex cystic structure. No evidence of internal vascularity on color Doppler imaging to suggest a vascular pedicle and therefore an abnormal lymph node. IMPRESSION: 1. Positive for superficial thrombophlebitis of the cephalic vein beginning in the distal upper arm and extending into the forearm. 2. Nonspecific complex cystic structure in the  superficial soft tissues of the right axilla. This may represent a hematoma, or possibly an abscess in the appropriate clinical setting. Pathologic lymphadenopathy is considered less likely given the absence of vascularity within the structure. 3. No evidence of deep venous thrombosis. Electronically Signed   By: Malachy Moan M.D.   On: 02/18/2021 08:33   DG Chest Port 1 View  Result Date: 02/10/2021 CLINICAL DATA:  Nasogastric tube placement. EXAM: PORTABLE CHEST 1 VIEW COMPARISON:  Radiographs 02/07/2021 and 11/07/2020. FINDINGS: 1233 hours. Enteric tube projects over the left upper quadrant of the stomach, consistent with location in the proximal stomach. The stomach is mildly distended with air. No evidence of bowel wall thickening or pneumoperitoneum. The heart is mildly enlarged. Telemetry leads overlie the chest and upper abdomen. IMPRESSION: Enteric tube overlies the proximal  stomach. Electronically Signed   By: Carey Bullocks M.D.   On: 02/10/2021 15:00   DG Chest Port 1 View  Result Date: 02/07/2021 CLINICAL DATA:  Questionable sepsis in a 70 year old female. EXAM: PORTABLE CHEST 1 VIEW COMPARISON:  Nov 07, 2020. FINDINGS: EKG leads projecting over the chest. Cardiomediastinal contours and hilar structures are normal. Lungs are clear. No sign of effusion or visible pneumothorax. IMPRESSION: No acute cardiopulmonary process. Electronically Signed   By: Donzetta Kohut M.D.   On: 02/07/2021 16:17   ECHOCARDIOGRAM COMPLETE  Result Date: 02/08/2021    ECHOCARDIOGRAM REPORT   Patient Name:   Erica Mueller Date of Exam: 02/08/2021 Medical Rec #:  [id number]      Height:       62.0 in Accession #:    [id number]     Weight:       110.2 lb Date of Birth:  1950-09-23       BSA:          1.484 m Patient Age:    70 years       BP:           114/55 mmHg Patient Gender: F              HR:           102 bpm. Exam Location:  [facility] Procedure: 2D Echo, Color Doppler and Cardiac Doppler Indications:     Elevated  d-dimer  History:         Patient has no prior history of Echocardiogram examinations.                  CAD; Risk Factors:Hypertension and Diabetes. Pt tested positive                  for COVID-19 on 02/07/21.  Sonographer:     Humphrey Rolls Referring Phys:  [id number] Judithe Modest Diagnosing Phys: Debbe Odea MD  Sonographer Comments: Technically challenging study due to limited acoustic windows. Image acquisition challenging due to uncooperative patient. IMPRESSIONS  1. Left ventricular ejection fraction, by estimation, is 30 to 35%. The left ventricle has moderate to severely decreased function. The left ventricle demonstrates global hypokinesis. Left ventricular diastolic parameters are consistent with Grade I diastolic dysfunction (impaired relaxation).  2. Right ventricular systolic function was not well visualized. The right ventricular size is normal.  3. The mitral valve is degenerative. Mild mitral valve regurgitation.  4. The aortic valve was not well visualized. Aortic valve regurgitation is mild to moderate. FINDINGS  Left Ventricle: Left ventricular ejection fraction, by estimation, is 30 to 35%. The left ventricle has moderate to severely decreased function. The left ventricle demonstrates global hypokinesis. The left ventricular internal cavity size was normal in size. There is no left ventricular hypertrophy. Left ventricular diastolic parameters are consistent with Grade I diastolic dysfunction (impaired relaxation). Right Ventricle: The right ventricular size is normal. Right vetricular wall thickness was not well visualized. Right ventricular systolic function was not well visualized. Left Atrium: Left atrial size was normal in size. Right Atrium: Right atrial size was normal in size. Pericardium: There is no evidence of pericardial effusion. Mitral Valve: The mitral valve is degenerative in appearance. There is mild thickening of the mitral valve leaflet(s). There is mild calcification of  the mitral valve leaflet(s). Mild mitral valve regurgitation. MV peak gradient, 6.2 mmHg. The mean mitral valve gradient is 2.0 mmHg. Tricuspid Valve: The tricuspid valve is not well visualized. Tricuspid valve  regurgitation is not demonstrated. Aortic Valve: The aortic valve was not well visualized. Aortic valve regurgitation is mild to moderate. Aortic regurgitation PHT measures 362 msec. Aortic valve mean gradient measures 3.0 mmHg. Aortic valve peak gradient measures 7.1 mmHg. Aortic valve area, by VTI measures 2.27 cm. Pulmonic Valve: The pulmonic valve was not well visualized. Pulmonic valve regurgitation is not visualized. Aorta: The aortic root is normal in size and structure. Venous: The inferior vena cava was not well visualized. IAS/Shunts: No atrial level shunt detected by color flow Doppler.  LEFT VENTRICLE PLAX 2D LVIDd:         4.65 cm  Diastology LVIDs:         3.96 cm  LV e' medial:    2.50 cm/s LV PW:         0.97 cm  LV E/e' medial:  26.7 LV IVS:        0.74 cm  LV e' lateral:   4.13 cm/s LVOT diam:     1.90 cm  LV E/e' lateral: 16.2 LV SV:         44 LV SV Index:   30 LVOT Area:     2.84 cm  RIGHT VENTRICLE RV Basal diam:  2.03 cm LEFT ATRIUM           Index       RIGHT ATRIUM          Index LA diam:      2.90 cm 1.95 cm/m  RA Area:     6.56 cm LA Vol (A4C): 36.0 ml 24.26 ml/m RA Volume:   11.70 ml 7.88 ml/m  AORTIC VALVE AV Area (Vmax):    2.24 cm AV Area (Vmean):   2.47 cm AV Area (VTI):     2.27 cm AV Vmax:           133.00 cm/s AV Vmean:          83.200 cm/s AV VTI:            0.195 m AV Peak Grad:      7.1 mmHg AV Mean Grad:      3.0 mmHg LVOT Vmax:         105.00 cm/s LVOT Vmean:        72.400 cm/s LVOT VTI:          0.156 m LVOT/AV VTI ratio: 0.80 AI PHT:            362 msec  AORTA Ao Root diam: 2.70 cm MITRAL VALVE MV Area (PHT): 6.02 cm     SHUNTS MV Area VTI:   3.79 cm     Systemic VTI:  0.16 m MV Peak grad:  6.2 mmHg     Systemic Diam: 1.90 cm MV Mean grad:  2.0 mmHg MV  Vmax:       1.25 m/s MV Vmean:      67.2 cm/s MV Decel Time: 126 msec MV E velocity: 66.80 cm/s MV A velocity: 123.00 cm/s MV E/A ratio:  0.54 Debbe Odea MD Electronically signed by Debbe Odea MD Signature Date/Time: 02/08/2021/5:46:09 PM    Final     Labs:  CBC: Recent Labs    02/19/21 0407 02/20/21 0640 02/21/21 0448 02/22/21 0541  WBC 4.4 5.2 5.2 4.4  HGB 10.9* 10.1* 10.1* 10.5*  HCT 34.7* 31.2* 32.3* 32.3*  PLT 238 233 231 233    COAGS: Recent Labs    11/07/20 2155 02/07/21 1516 02/23/21 0400  INR 1.1 1.5* 1.0  APTT 34 34  --     BMP: Recent Labs    02/19/21 0407 02/20/21 0640 02/21/21 0448 02/22/21 0541  NA 138 135 137 136  K 4.8 4.6 4.9 4.7  CL 103 102 104 104  CO2 29 27 28 27   GLUCOSE 162* 216* 182* 131*  BUN 16 17 16 15   CALCIUM 8.7* 8.5* 8.9 8.8*  CREATININE 0.56 0.46 0.52 0.52  GFRNONAA >60 >60 >60 >60    LIVER FUNCTION TESTS: Recent Labs    02/16/21 0433 02/17/21 0431 02/18/21 0417 02/19/21 0407  BILITOT 0.5 0.4 0.3 0.4  AST 55* 29 36 33  ALT 60* 45* 46* 44  ALKPHOS 47 49 44 44  PROT 6.7 6.2* 6.2* 6.0*  ALBUMIN 2.6* 2.4* 2.4* 2.4*    TUMOR MARKERS: No results for input(s): AFPTM, CEA, CA199, CHROMGRNA in the last 8760 hours.  Assessment and Plan:  Malnutrition secondary to metal status change after Covid illness.  Will proceed with image guided placement of a gastrostomy tube today by Dr. 04/20/21.  Risks and benefits image guided gastrostomy tube placement was discussed with the patient's son including, but not limited to the need for a barium enema during the procedure, bleeding, infection, peritonitis and/or damage to adjacent structures.  All  questions were answered, patient is agreeable to proceed.  Consent signed and in chart.  Electronically Signed: 04/21/21, PA-C   02/23/2021, 2:05 PM      I spent a total of 20 Minutes  in face to face in clinical consultation, greater than 50% of which was  counseling/coordinating care for Gtube placment.

## 2021-02-23 NOTE — Progress Notes (Signed)
Patient was brought to IR for percutaneous gastrostomy tube placement.  This is a challenging case for multiple reasons including patient's mental status and anatomy.  Patient is unable to adequately open mouth and cannot evaluate for sedation.  However, patient is not responding to stimulus and may not need sedation.  Unable to advance an orogastric tube into esophagus but patient already has a NGT.  Inflated stomach with NGT and noted a very large amount of bowel gas throughout the abdomen and large amount of gas in the colon.  Not clear if there is a safe percutaneous window for gastrostomy tube placement.  Reviewed prior Chest CT but the entire stomach is not imaged.  Will order an abdominal CT to evaluate anatomy for gastrostomy tube placement. Will resume tube feeds for now.

## 2021-02-23 NOTE — Progress Notes (Signed)
Patient ID: Erica Mueller, female   DOB: 1950/08/04, 70 y.o.   MRN: [id number] [facility] PROGRESS NOTE  Erica Mueller IRJ:[id number] DOB: [birth year] DOA: 02/07/2021 PCP: [name]  HPI/Subjective: Patient seen early this morning and unable to talk with me but did have her eyes open and moved her legs on her own.  Objective: Vitals:   02/23/21 0437 02/23/21 0700  BP: (!) 175/52 (!) 150/68  Pulse: 96 98  Resp: 18 18  Temp: 98.9 F (37.2 C) 97.6 F (36.4 C)  SpO2: 100% 96%   No intake or output data in the 24 hours ending 02/23/21 1500 Filed Weights   02/19/21 0500 02/20/21 0500 02/21/21 0358  Weight: 50.8 kg 50 kg 51.5 kg    ROS: Review of Systems  Unable to perform ROS: Acuity of condition  Exam: Physical Exam HENT:     Head: Normocephalic.     Mouth/Throat:     Pharynx: No oropharyngeal exudate.  Eyes:     General: Lids are normal.     Conjunctiva/sclera: Conjunctivae normal.  Cardiovascular:     Rate and Rhythm: Normal rate and regular rhythm.     Heart sounds: Normal heart sounds, S1 normal and S2 normal.  Pulmonary:     Breath sounds: Normal breath sounds. No decreased breath sounds, wheezing, rhonchi or rales.  Abdominal:     Palpations: Abdomen is soft.     Tenderness: There is no abdominal tenderness.  Musculoskeletal:     Right lower leg: No swelling.     Left lower leg: No swelling.  Skin:    General: Skin is warm.     Findings: No rash.  Neurological:     Mental Status: She is alert.     Comments: Did move her legs on her own this morning.      Scheduled Meds:  vitamin C  500 mg Per Tube Daily   chlorhexidine  15 mL Mouth Rinse BID   Chlorhexidine Gluconate Cloth  6 each Topical Daily   diazepam  2.5 mg Intravenous Q8H   fentaNYL       insulin aspart  0-9 Units Subcutaneous Q4H   mouth rinse  15 mL Mouth Rinse q12n4p   metoprolol tartrate  25 mg Per NG tube BID   midazolam       thiothixene  2 mg Per Tube BID   Continuous  Infusions:   ceFAZolin (ANCEF) IV     dextrose      Assessment/Plan:  Failure to thrive.  Unable to eat with her mental status being as it is.  Patient had an NG tube receiving nutritional feeds here.  Interventional radiology to place a feeding tube today. Catatonic with history of schizophrenia.  TSH normal range.  RPR nonreactive.  B12 normal range.  Appreciate psychiatric consultation and reordered Navane 2 mg twice a day and Valium 2.5 mg 3 times a day.  Continue to watch mental status. Type 2 diabetes with hypoglycemia.  Start D5 drip since n.p.o. most of the day.  And likely will be a while since she will be fed through the new tube.  Hold insulin at this point. Severe sepsis, present on admission with aspiration pneumonia.  Completed antibiotics. Hypernatremia, hypokalemia and hypophosphatemia all improved into the normal range. Transaminitis improved into the normal range Cardiomyopathy Decubitus ulcer sacrum, present on admission.  See full description below. Nonsustained ventricular tachycardia which was asymptomatic secondary to electrolyte abnormalities Acute kidney injury.  Resolved with IV fluids  Thrombocytopenia.  This also improved Severe protein calorie malnutrition.  Pressure Injury 02/08/21 Sacrum Medial Stage 2 -  Partial thickness loss of dermis presenting as a shallow open injury with a red, pink wound bed without slough. dry (Active)  02/08/21 0700  Location: Sacrum  Location Orientation: Medial  Staging: Stage 2 -  Partial thickness loss of dermis presenting as a shallow open injury with a red, pink wound bed without slough.  Wound Description (Comments): dry  Present on Admission: Yes       Code Status:     Code Status Orders  (From admission, onward)           Start     Ordered   02/07/21 1806  Full code  Continuous        02/07/21 1809           Code Status History     Date Active Date Inactive Code Status Order ID Comments User Context    11/07/2020 2347 11/11/2020 2103 Full Code [id number]  Andris Baumann, MD ED   10/16/2017 0053 10/24/2017 1726 Full Code [id number]  Audery Amel, MD Inpatient   05/27/2017 0024 05/30/2017 1929 Full Code [id number]  Clapacs, Jackquline Denmark, MD Inpatient      Family Communication: Spoke with son on the phone Disposition Plan: Status is: Inpatient  Dispo: The patient is from: Rehab              Anticipated d/c is to: Rehab              Patient currently n.p.o. for PEG tube placement today.  Would also like to see if mental status improves with restarting psychiatric medication.   Difficult to place patient.  Hopefully not  Consultants: Interventional radiology Psychiatry  Time spent: 26 minutes  Jereme [employer]

## 2021-02-24 ENCOUNTER — Inpatient Hospital Stay: Payer: Medicare Other | Admitting: Radiology

## 2021-02-24 DIAGNOSIS — E11649 Type 2 diabetes mellitus with hypoglycemia without coma: Secondary | ICD-10-CM | POA: Diagnosis not present

## 2021-02-24 DIAGNOSIS — F202 Catatonic schizophrenia: Secondary | ICD-10-CM | POA: Diagnosis not present

## 2021-02-24 DIAGNOSIS — A419 Sepsis, unspecified organism: Secondary | ICD-10-CM | POA: Diagnosis not present

## 2021-02-24 DIAGNOSIS — R627 Adult failure to thrive: Secondary | ICD-10-CM | POA: Diagnosis not present

## 2021-02-24 HISTORY — PX: IR GASTROSTOMY TUBE MOD SED: IMG625

## 2021-02-24 LAB — GLUCOSE, CAPILLARY
Glucose-Capillary: 68 mg/dL — ABNORMAL LOW (ref 70–99)
Glucose-Capillary: 110 mg/dL — ABNORMAL HIGH (ref 70–99)
Glucose-Capillary: 120 mg/dL — ABNORMAL HIGH (ref 70–99)
Glucose-Capillary: 106 mg/dL — ABNORMAL HIGH (ref 70–99)

## 2021-02-24 LAB — CBC
HCT: 37.4 % (ref 36.0–46.0)
Hemoglobin: 12.2 g/dL (ref 12.0–15.0)
MCH: 31.2 pg (ref 26.0–34.0)
MCHC: 32.6 g/dL (ref 30.0–36.0)
MCV: 95.7 fL (ref 80.0–100.0)
Platelets: 223 10*3/uL (ref 150–400)
RBC: 3.91 MIL/uL (ref 3.87–5.11)
RDW: 15.5 % (ref 11.5–15.5)
WBC: 3.8 10*3/uL — ABNORMAL LOW (ref 4.0–10.5)
nRBC: 0 % (ref 0.0–0.2)

## 2021-02-24 LAB — BASIC METABOLIC PANEL
Anion gap: 8 (ref 5–15)
BUN: 17 mg/dL (ref 8–23)
CO2: 25 mmol/L (ref 22–32)
Calcium: 9.2 mg/dL (ref 8.9–10.3)
Chloride: 103 mmol/L (ref 98–111)
Creatinine, Ser: 0.45 mg/dL (ref 0.44–1.00)
GFR, Estimated: >60 mL/min (ref >60)
Glucose, Bld: 85 mg/dL (ref 70–99)
Potassium: 4.2 mmol/L (ref 3.5–5.1)
Sodium: 136 mmol/L (ref 135–145)

## 2021-02-24 MED ORDER — DIAZEPAM 5 MG/ML IJ SOLN
5.0000 mg | Freq: Three times a day (TID) | INTRAMUSCULAR | Status: DC
  Administered 2021-02-24 – 2021-02-27 (×9): 5 mg via INTRAVENOUS
  Filled 2021-02-24 (×9): qty 2

## 2021-02-24 MED ORDER — IOHEXOL 300 MG/ML  SOLN
20.0000 mL | Freq: Once | INTRAMUSCULAR | Status: AC | PRN
  Administered 2021-02-24: 20 mL

## 2021-02-24 MED ORDER — GLUCAGON HCL RDNA (DIAGNOSTIC) 1 MG IJ SOLR
INTRAMUSCULAR | Status: AC
  Filled 2021-02-24: qty 1

## 2021-02-24 MED ORDER — LIDOCAINE HCL 1 % IJ SOLN
INTRAMUSCULAR | Status: AC
  Administered 2021-02-24: 6 mL
  Filled 2021-02-24: qty 20

## 2021-02-24 MED ORDER — METOPROLOL TARTRATE 25 MG PO TABS
12.5000 mg | ORAL_TABLET | Freq: Two times a day (BID) | ORAL | Status: DC
  Administered 2021-02-24 – 2021-02-25 (×2): 12.5 mg via NASOGASTRIC
  Filled 2021-02-24 (×2): qty 1

## 2021-02-24 MED ORDER — OSMOLITE 1.2 CAL PO LIQD
1000.0000 mL | ORAL | Status: DC
  Administered 2021-02-25: 1000 mL

## 2021-02-24 MED ORDER — GLUCAGON HCL RDNA (DIAGNOSTIC) 1 MG IJ SOLR
INTRAMUSCULAR | Status: DC | PRN
  Administered 2021-02-24: .5 mg via INTRAVENOUS

## 2021-02-24 MED ORDER — FENTANYL CITRATE (PF) 100 MCG/2ML IJ SOLN
INTRAMUSCULAR | Status: DC | PRN
  Administered 2021-02-24: 25 ug via INTRAVENOUS

## 2021-02-24 MED ORDER — MIDAZOLAM HCL 2 MG/2ML IJ SOLN
INTRAMUSCULAR | Status: AC
  Filled 2021-02-24: qty 2

## 2021-02-24 MED ORDER — FENTANYL CITRATE (PF) 100 MCG/2ML IJ SOLN
INTRAMUSCULAR | Status: AC
  Filled 2021-02-24: qty 2

## 2021-02-24 NOTE — Procedures (Deleted)
.  jmpo

## 2021-02-24 NOTE — Procedures (Signed)
Vascular and Interventional Radiology Procedure Note  Patient: Erica Mueller DOB: [birth year] Medical Record Number: [id number] Note Date/Time: 02/24/21 12:56 PM   Performing Physician: Roanna Banning, MD Assistant(s): None  Diagnosis: Dysphagia  Procedure: PERCUTANEOUS GASTROSTOMY TUBE PLACEMENT  Anesthesia: Conscious Sedation Complications: None Estimated Blood Loss: Minimal  Findings:  Successful placement of a  82 F gastrostomy tube under fluoroscopy.   See detailed procedure note with images in PACS. The patient tolerated the procedure well without incident or complication and was returned to Floor Bed in stable condition.    Roanna Banning, MD [facility] Radiology   Pager. [phone] Clinic. [phone]

## 2021-02-24 NOTE — Consult Note (Signed)
[facility] Face-to-Face Psychiatry Consult   Reason for Consult: Consult to follow-up with this 70 year old woman with chronic schizoaffective disorder currently catatonic. Referring Physician:  Hilton Sinclair Patient Identification: Erica Mueller MRN:  [id number] Principal Diagnosis: Catatonia Diagnosis:  Principal Problem:   Catatonia Active Problems:   Type 2 diabetes mellitus with hypoglycemia without coma (HCC)   Schizophrenia (HCC)   Sepsis (HCC)   Pressure injury of skin   Protein-calorie malnutrition, severe   Failure to thrive in adult   Hypernatremia   Hypokalemia   AKI (acute kidney injury) (HCC)   Total Time spent with patient: 30 minutes  Subjective:   ASENCION Mueller is a 70 y.o. female patient admitted with patient is nonverbal.  HPI: Patient seen chart reviewed.  Spoke with hospitalist.  70 year old woman with a history of schizoaffective disorder.  Patient remains unresponsive but awake.  Has become catatonic like in presentation after an episode of COVID sepsis.  Patient's eyes were open and she clearly understood someone was in the room but would not attempt to vocalize.  Would not follow commands to close her eyes or move her hand.  Patient was started on low-dose diazepam as well as restarting antipsychotic with Navane.  Still has a feeding tube in.  Attempts to put in a PEG tube yesterday were temporarily unsuccessful  Past Psychiatric History: Past history of schizoaffective disorder with the last contact we had had with her as a manic psychosis on the ward.  Risk to Self:   Risk to Others:   Prior Inpatient Therapy:   Prior Outpatient Therapy:    Past Medical History:  Past Medical History:  Diagnosis Date   Coronary artery disease    Depression    Diabetes mellitus without complication (HCC)    Elevated lipids    GERD (gastroesophageal reflux disease)    Hypertension    Schizophrenia (HCC)    Syphilis (acquired)     Past Surgical History:  Procedure  Laterality Date   COLONOSCOPY     COLONOSCOPY WITH PROPOFOL N/A 09/01/2015   Procedure: COLONOSCOPY WITH PROPOFOL;  Surgeon: Wallace Cullens, MD;  [facility] END[facility]COPY;  Service: Gastroenterology;  Laterality: N/A;   IR FLUORO RM 30-60 MIN  02/23/2021   IR GASTROSTOMY TUBE MOD SED  02/24/2021   TUBAL LIGATION     wisdom teeth removal     Family History:  Family History  Problem Relation Age of Onset   Cancer Mother    Alcoholism Mother    Bone cancer Father    Heart disease Father    Diabetes Maternal Grandmother    Suicidality Paternal Uncle    Breast cancer Neg Hx    Family Psychiatric  History: See previous Social History:  Social History   Substance and Sexual Activity  Alcohol Use Yes   Alcohol/week: 3.0 standard drinks   Types: 2 Cans of beer, 1 Shots of liquor per week     Social History   Substance and Sexual Activity  Drug Use Not Currently   Types: Marijuana   Comment: PAST     Social History   Socioeconomic History   Marital status: Divorced    Spouse name: Not on file   Number of children: Not on file   Years of education: Not on file   Highest education level: Not on file  Occupational History   Not on file  Tobacco Use   Smoking status: Every Day    Packs/day: 1.00    Years: 48.00  Pack years: 48.00    Types: Cigarettes    Start date: 12/20/1969   Smokeless tobacco: Never  Vaping Use   Vaping Use: Never used  Substance and Sexual Activity   Alcohol use: Yes    Alcohol/week: 3.0 standard drinks    Types: 2 Cans of beer, 1 Shots of liquor per week   Drug use: Not Currently    Types: Marijuana    Comment: PAST    Sexual activity: Not Currently    Birth control/protection: None  Other Topics Concern   Not on file  Social History Narrative   Not on file   Social Determinants of Health   Financial Resource Strain: Not on file  Food Insecurity: Not on file  Transportation Needs: Not on file  Physical Activity: Not on file  Stress: Not on  file  Social Connections: Not on file   Additional Social History:    Allergies:  No Known Allergies  Labs:  Results for orders placed or performed during the hospital encounter of 02/07/21 (from the past 48 hour(s))  Glucose, capillary     Status: Abnormal   Collection Time: 02/22/21  8:02 PM  Result Value Ref Range   Glucose-Capillary 171 (H) 70 - 99 mg/dL    Comment: Glucose reference range applies only to samples taken after fasting for at least 8 hours.  Glucose, capillary     Status: None   Collection Time: 02/23/21 12:30 AM  Result Value Ref Range   Glucose-Capillary 77 70 - 99 mg/dL    Comment: Glucose reference range applies only to samples taken after fasting for at least 8 hours.  Glucose, capillary     Status: Abnormal   Collection Time: 02/23/21  3:56 AM  Result Value Ref Range   Glucose-Capillary 108 (H) 70 - 99 mg/dL    Comment: Glucose reference range applies only to samples taken after fasting for at least 8 hours.  RPR     Status: None   Collection Time: 02/23/21  4:00 AM  Result Value Ref Range   RPR Ser Ql NON REACTIVE NON REACTIVE    Comment: Performed at [facility], [address], [place], [place] [address]  Vitamin B12     Status: None   Collection Time: 02/23/21  4:00 AM  Result Value Ref Range   Vitamin B-12 626 180 - 914 pg/mL    Comment: (NOTE) This assay is not validated for testing neonatal or myeloproliferative syndrome specimens for Vitamin B12 levels. Performed at [facility], [address], [place], [place] [address]   Protime-INR     Status: None   Collection Time: 02/23/21  4:00 AM  Result Value Ref Range   Prothrombin Time 13.5 11.4 - 15.2 seconds   INR 1.0 0.8 - 1.2    Comment: (NOTE) INR goal varies based on device and disease states. Performed at [facility], [address] [place], [place] [address]   Glucose, capillary     Status: Abnormal   Collection Time: 02/23/21  7:44 AM  Result Value Ref  Range   Glucose-Capillary 121 (H) 70 - 99 mg/dL    Comment: Glucose reference range applies only to samples taken after fasting for at least 8 hours.  Glucose, capillary     Status: Abnormal   Collection Time: 02/23/21 11:37 AM  Result Value Ref Range   Glucose-Capillary 56 (L) 70 - 99 mg/dL    Comment: Glucose reference range applies only to samples  taken after fasting for at least 8 hours.  Glucose, capillary     Status: Abnormal   Collection Time: 02/23/21 11:53 AM  Result Value Ref Range   Glucose-Capillary 165 (H) 70 - 99 mg/dL    Comment: Glucose reference range applies only to samples taken after fasting for at least 8 hours.  Glucose, capillary     Status: Abnormal   Collection Time: 02/23/21  8:27 PM  Result Value Ref Range   Glucose-Capillary 125 (H) 70 - 99 mg/dL    Comment: Glucose reference range applies only to samples taken after fasting for at least 8 hours.  Glucose, capillary     Status: Abnormal   Collection Time: 02/23/21 11:44 PM  Result Value Ref Range   Glucose-Capillary 182 (H) 70 - 99 mg/dL    Comment: Glucose reference range applies only to samples taken after fasting for at least 8 hours.  Glucose, capillary     Status: Abnormal   Collection Time: 02/24/21  4:00 AM  Result Value Ref Range   Glucose-Capillary 68 (L) 70 - 99 mg/dL    Comment: Glucose reference range applies only to samples taken after fasting for at least 8 hours.  CBC     Status: Abnormal   Collection Time: 02/24/21  5:34 AM  Result Value Ref Range   WBC 3.8 (L) 4.0 - 10.5 K/uL   RBC 3.91 3.87 - 5.11 MIL/uL   Hemoglobin 12.2 12.0 - 15.0 g/dL   HCT 32.3 55.7 - 32.2 %   MCV 95.7 80.0 - 100.0 fL   MCH 31.2 26.0 - 34.0 pg   MCHC 32.6 30.0 - 36.0 g/dL   RDW 02.5 42.7 - 06.2 %   Platelets 223 150 - 400 K/uL   nRBC 0.0 0.0 - 0.2 %    Comment: Performed at [facility], [address] [place], [place] [address]  Basic metabolic panel     Status: None   Collection Time: 02/24/21   5:34 AM  Result Value Ref Range   Sodium 136 135 - 145 mmol/L   Potassium 4.2 3.5 - 5.1 mmol/L   Chloride 103 98 - 111 mmol/L   CO2 25 22 - 32 mmol/L   Glucose, Bld 85 70 - 99 mg/dL    Comment: Glucose reference range applies only to samples taken after fasting for at least 8 hours.   BUN 17 8 - 23 mg/dL   Creatinine, Ser 3.15 0.44 - 1.00 mg/dL   Calcium 9.2 8.9 - 17.6 mg/dL   GFR, Estimated >16 >07 mL/min    Comment: (NOTE) Calculated using the CKD-EPI Creatinine Equation (2021)    Anion gap 8 5 - 15    Comment: Performed at [facility], [address] [place], [place] [address]  Glucose, capillary     Status: Abnormal   Collection Time: 02/24/21  8:24 AM  Result Value Ref Range   Glucose-Capillary 110 (H) 70 - 99 mg/dL    Comment: Glucose reference range applies only to samples taken after fasting for at least 8 hours.  Glucose, capillary     Status: Abnormal   Collection Time: 02/24/21  4:21 PM  Result Value Ref Range   Glucose-Capillary 120 (H) 70 - 99 mg/dL    Comment: Glucose reference range applies only to samples taken after fasting for at least 8 hours.    Current Facility-Administered Medications  Medication Dose Route Frequency Provider Last Rate Last Admin   acetaminophen (TYLENOL) suppository 650 mg  650 mg Rectal Q4H PRN Jimmye Norman, NP       acetaminophen (TYLENOL) tablet 650 mg  650 mg Oral Q4H PRN Harlon Ditty D, NP       ascorbic acid (VITAMIN C) tablet 500 mg  500 mg Per Tube Daily Charise Killian, MD   500 mg at 02/22/21 1004   chlorhexidine (PERIDEX) 0.12 % solution 15 mL  15 mL Mouth Rinse BID Alberteen Sam, MD   15 mL at 02/23/21 2202   Chlorhexidine Gluconate Cloth 2 % PADS 6 each  6 each Topical Daily Erin Fulling, MD   6 each at 02/24/21 1000   dextrose 5 % solution   Intravenous Continuous Alford Highland, MD 50 mL/hr at 02/24/21 1703 New Bag at 02/24/21 1703   diazepam (VALIUM) injection 5 mg  5 mg Intravenous  Q8H Korayma Hagwood T, MD       docusate sodium (COLACE) capsule 100 mg  100 mg Oral BID PRN Judithe Modest, NP       [START ON 02/25/2021] feeding supplement (OSMOLITE 1.2 CAL) liquid 1,000 mL  1,000 mL Per Tube Continuous Alford Highland, MD       fentaNYL (SUBLIMAZE) 100 MCG/2ML injection            fentaNYL (SUBLIMAZE) injection    PRN Roanna Banning, MD   25 mcg at 02/24/21 1227   free water 100 mL  100 mL Per Tube Q4H Alford Highland, MD   100 mL at 02/24/21 1624   glucagon (human recombinant) (GLUCAGEN) 1 MG injection            glucagon (human recombinant) (GLUCAGEN) injection    PRN Roanna Banning, MD   0.5 mg at 02/24/21 1225   insulin aspart (novoLOG) injection 0-9 Units  0-9 Units Subcutaneous Q4H Alford Highland, MD   2 Units at 02/23/21 2351   MEDLINE mouth rinse  15 mL Mouth Rinse q12n4p Danford, Earl Lites, MD   15 mL at 02/24/21 1625   metoprolol tartrate (LOPRESSOR) tablet 12.5 mg  12.5 mg Per NG tube BID Alford Highland, MD       morphine 2 MG/ML injection 2 mg  2 mg Intravenous Q4H PRN Jimmye Norman, NP       ondansetron [facility]) injection 4 mg  4 mg Intravenous Q6H PRN Judithe Modest, NP       polyethylene glycol (MIRALAX / GLYCOLAX) packet 17 g  17 g Oral Daily PRN Judithe Modest, NP       thiothixene (NAVANE) capsule 2 mg  2 mg Per Tube BID Omar Orrego, Jackquline Denmark, MD   2 mg at 02/23/21 2202    Musculoskeletal: Strength & Muscle Tone: decreased Gait & Station: unsteady Patient leans: N/A            Psychiatric Specialty Exam:  Presentation  General Appearance:  No data recorded Eye Contact: No data recorded Speech: No data recorded Speech Volume: No data recorded Handedness: No data recorded  Mood and Affect  Mood: No data recorded Affect: No data recorded  Thought Process  Thought Processes: No data recorded Descriptions of Associations:No data recorded Orientation:No data recorded Thought Content:No data recorded History of  Schizophrenia/Schizoaffective disorder:No data recorded Duration of Psychotic Symptoms:No data recorded Hallucinations:No data recorded Ideas of Reference:No data recorded Suicidal Thoughts:No data recorded Homicidal Thoughts:No data recorded  Sensorium  Memory: No data recorded Judgment: No data recorded Insight: No data recorded  Executive Functions  Concentration: No data recorded  Attention Span: No data recorded Recall: No data recorded Fund of Knowledge: No data recorded Language: No data recorded  Psychomotor Activity  Psychomotor Activity: No data recorded  Assets  Assets: No data recorded  Sleep  Sleep: No data recorded  Physical Exam: Physical Exam Vitals and nursing note reviewed.  Constitutional:      Appearance: She is ill-appearing.  HENT:     Head: Normocephalic and atraumatic.     Mouth/Throat:     Pharynx: Oropharynx is clear.  Eyes:     Pupils: Pupils are equal, round, and reactive to light.  Cardiovascular:     Rate and Rhythm: Normal rate and regular rhythm.  Pulmonary:     Effort: Pulmonary effort is normal.     Breath sounds: Normal breath sounds.  Abdominal:     General: Abdomen is flat.     Palpations: Abdomen is soft.  Musculoskeletal:        General: Normal range of motion.  Skin:    General: Skin is warm and dry.  Neurological:     General: No focal deficit present.     Mental Status: She is alert. Mental status is at baseline.  Psychiatric:        Attention and Perception: She is inattentive.        Mood and Affect: Affect is blunt.        Speech: She is noncommunicative.        Behavior: Behavior is withdrawn.   Review of Systems  Unable to perform ROS: Psychiatric disorder  Blood pressure (!) 166/55, pulse 68, temperature 98.4 F (36.9 C), temperature source Oral, resp. rate 20, height 5\' 2"  (1.575 m), weight 51.5 kg, SpO2 97 %. Body mass index is 20.77 kg/m.  Treatment Plan Summary: Medication management and  Plan patient remains withdrawn but awake.  Not comatose but catatonic in her presentation.  I am increasing the dose of the diazepam to 5 mg 3 times a day.  Discussed with the hospitalist that if this is not working definitive treatment would probably be electroconvulsive therapy but I would need to talk with her son to establish whether they would give consent for this.  Mentioned it to the patient of course she gave no response.  We will continue to follow.  Disposition:  Continue treatment as described above  , MD 02/24/2021 5:27 PM

## 2021-02-24 NOTE — Progress Notes (Signed)
Patient ID: Erica Mueller, female   DOB: [birth year], 70 y.o.   MRN: [id number] [facility] PROGRESS NOTE  Erica Mueller:[id number] DOB: 1950/09/08 DOA: 02/07/2021 PCP: [name]  HPI/Subjective: Patient seen this morning.  Unable to talk.  Did not have the lipsmacking that she had previously.  Moved her legs on her own.  Admitted 17 days ago with COVID-19 infection and acute metabolic encephalopathy.  Objective: Vitals:   02/24/21 1240 02/24/21 1245  BP: 107/65 (!) 121/58  Pulse: 65 64  Resp: (!) 21 14  Temp:    SpO2: 96% 97%    Intake/Output Summary (Last 24 hours) at 02/24/2021 1534 Last data filed at 02/24/2021 0829 Gross per 24 hour  Intake 7831.66 ml  Output --  Net 7831.66 ml   Filed Weights   02/19/21 0500 02/20/21 0500 02/21/21 0358  Weight: 50.8 kg 50 kg 51.5 kg    ROS: Review of Systems  Unable to perform ROS: Acuity of condition  Exam: Physical Exam HENT:     Head: Normocephalic.     Mouth/Throat:     Pharynx: No oropharyngeal exudate.  Eyes:     General: Lids are normal.     Conjunctiva/sclera: Conjunctivae normal.  Cardiovascular:     Rate and Rhythm: Normal rate and regular rhythm.     Heart sounds: Normal heart sounds, S1 normal and S2 normal.  Pulmonary:     Breath sounds: No decreased breath sounds, wheezing, rhonchi or rales.  Abdominal:     Palpations: Abdomen is soft.     Tenderness: There is no abdominal tenderness.  Musculoskeletal:     Right lower leg: No swelling.     Left lower leg: No swelling.  Skin:    General: Skin is warm.     Findings: No rash.  Neurological:     Comments: Patient awakened from sleep and looked at me.  Did not speak.  Moved her legs on her own.      Scheduled Meds:  vitamin C  500 mg Per Tube Daily   chlorhexidine  15 mL Mouth Rinse BID   Chlorhexidine Gluconate Cloth  6 each Topical Daily   diazepam  2.5 mg Intravenous Q8H   fentaNYL       free water  100 mL Per Tube Q4H   glucagon  (human recombinant)       insulin aspart  0-9 Units Subcutaneous Q4H   mouth rinse  15 mL Mouth Rinse q12n4p   metoprolol tartrate  12.5 mg Per NG tube BID   thiothixene  2 mg Per Tube BID   Continuous Infusions:  dextrose 50 mL/hr at 02/24/21 0829    Assessment/Plan:  Failure to thrive.  Patient unable to eat with her mental status being impaired.  Patient had NG tube feedings for couple weeks.  Interventional radiology was successful in placing a PEG tube.  We will start tube feedings back tomorrow. Schizophrenia with catatonia.  Started back on Navane and Valium.  Continue to watch mental status through the weekend.  Dr. Toni Amend will consider ECT treatment if mental status does not improve. Type 2 diabetes mellitus with hypoglycemia.  On D5 drip for now.  Insulin held with tube feeds being on hold.  Will restart tube feedings tomorrow. Severe sepsis, present on admission with aspiration pneumonia.  Completed antibiotics. Hypernatremia, hypokalemia and hypophosphatemia.  All improved with NG tube feeding. Transaminitis improved into the normal range Cardiomyopathy Sacral decubitus ulcer, present on admission.  See full  description below Nonsustained ventricular tachycardia secondary to electrolyte abnormalities Acute kidney injury.  Resolved with IV fluids.  Creatinine 1.42 on presentation and down to 0.45 Thrombocytopenia also has improved Severe protein calorie malnutrition  Pressure Injury 02/08/21 Sacrum Medial Stage 2 -  Partial thickness loss of dermis presenting as a shallow open injury with a red, pink wound bed without slough. dry (Active)  02/08/21 0700  Location: Sacrum  Location Orientation: Medial  Staging: Stage 2 -  Partial thickness loss of dermis presenting as a shallow open injury with a red, pink wound bed without slough.  Wound Description (Comments): dry  Present on Admission: Yes       Code Status:     Code Status Orders  (From admission, onward)            Start     Ordered   02/07/21 1806  Full code  Continuous        02/07/21 1809           Code Status History     Date Active Date Inactive Code Status Order ID Comments User Context   11/07/2020 2347 11/11/2020 2103 Full Code [id number]  Andris Baumann, MD ED   10/16/2017 0053 10/24/2017 1726 Full Code [id number]  Audery Amel, MD Inpatient   05/27/2017 0024 05/30/2017 1929 Full Code [id number]  Clapacs, Jackquline Denmark, MD Inpatient      Family Communication: Spoke with son on the phone Disposition Plan: Status is: Inpatient  Dispo: The patient is from: Rehab              Anticipated d/c is to: Rehab              Patient currently just had PEG tube placed today.  Monitoring mental status with restarting psychiatric medications.  Psychiatry to consider ECT treatments if mental status does not improve with medication.   Difficult to place patient.  No.  Consultants: Psychiatry  Time spent: 27 minutes  Marlon Suleiman [employer]

## 2021-02-25 DIAGNOSIS — E11649 Type 2 diabetes mellitus with hypoglycemia without coma: Secondary | ICD-10-CM | POA: Diagnosis not present

## 2021-02-25 DIAGNOSIS — E43 Unspecified severe protein-calorie malnutrition: Secondary | ICD-10-CM | POA: Diagnosis not present

## 2021-02-25 DIAGNOSIS — U071 COVID-19: Secondary | ICD-10-CM

## 2021-02-25 DIAGNOSIS — R627 Adult failure to thrive: Secondary | ICD-10-CM | POA: Diagnosis not present

## 2021-02-25 DIAGNOSIS — F202 Catatonic schizophrenia: Secondary | ICD-10-CM | POA: Diagnosis not present

## 2021-02-25 LAB — GLUCOSE, CAPILLARY
Glucose-Capillary: 116 mg/dL — ABNORMAL HIGH (ref 70–99)
Glucose-Capillary: 131 mg/dL — ABNORMAL HIGH (ref 70–99)
Glucose-Capillary: 183 mg/dL — ABNORMAL HIGH (ref 70–99)
Glucose-Capillary: 184 mg/dL — ABNORMAL HIGH (ref 70–99)
Glucose-Capillary: 253 mg/dL — ABNORMAL HIGH (ref 70–99)
Glucose-Capillary: 81 mg/dL (ref 70–99)

## 2021-02-25 NOTE — Consult Note (Signed)
Client calm on assessment.  She turns when her name is spoken and moves around at times in the bed.  Legs with contractures and wound dressings to her heels.  Nonverbal on assessment, eyes open and looking around at times.  Hand mitts in place, will continue to assess.  Nanine Means, PMHNP

## 2021-02-25 NOTE — Plan of Care (Signed)
  Problem: Education: Goal: Knowledge of General Education information will improve Description: Including pain rating scale, medication(s)/side effects and non-pharmacologic comfort measures Outcome: Progressing   Problem: Health Behavior/Discharge Planning: Goal: Ability to manage health-related needs will improve Outcome: Progressing   Problem: Clinical Measurements: Goal: Ability to maintain clinical measurements within normal limits will improve Outcome: Progressing Goal: Will remain free from infection Outcome: Progressing Goal: Diagnostic test results will improve Outcome: Progressing Goal: Respiratory complications will improve Outcome: Progressing Goal: Cardiovascular complication will be avoided Outcome: Progressing   Problem: Activity: Goal: Risk for activity intolerance will decrease Outcome: Progressing   Problem: Nutrition: Goal: Adequate nutrition will be maintained Outcome: Progressing   Problem: Coping: Goal: Level of anxiety will decrease Outcome: Progressing   Problem: Elimination: Goal: Will not experience complications related to bowel motility Outcome: Progressing Goal: Will not experience complications related to urinary retention Outcome: Progressing   Problem: Pain Managment: Goal: General experience of comfort will improve Outcome: Progressing   Problem: Safety: Goal: Ability to remain free from injury will improve Outcome: Progressing   Problem: Skin Integrity: Goal: Risk for impaired skin integrity will decrease Outcome: Progressing   Problem: Respiratory: Goal: Will maintain a patent airway Outcome: Progressing Goal: Complications related to the disease process, condition or treatment will be avoided or minimized Outcome: Progressing   

## 2021-02-25 NOTE — Progress Notes (Signed)
Patient ID: Erica Mueller, female   DOB: [birth year], 70 y.o.   MRN: [id number] [facility] PROGRESS NOTE  TEMPERENCE ZENOR EUM:[id number] DOB: 10-05-1950 DOA: 02/07/2021 PCP: [name]  HPI/Subjective: Patient awakened from sleep.  She opened her eyes but went back to sleep.  Still catatonic.  Admitted 17 days ago with severe sepsis UTI and COVID-19 infection.  Objective: Vitals:   02/25/21 0756 02/25/21 1203  BP: (!) 133/59 105/69  Pulse: 86 74  Resp: 15 18  Temp: 98.4 F (36.9 C) 98.9 F (37.2 C)  SpO2: 99% 100%    Intake/Output Summary (Last 24 hours) at 02/25/2021 1311 Last data filed at 02/25/2021 0400 Gross per 24 hour  Intake 567.26 ml  Output --  Net 567.26 ml   Filed Weights   02/19/21 0500 02/20/21 0500 02/21/21 0358  Weight: 50.8 kg 50 kg 51.5 kg    ROS: Review of Systems  Unable to perform ROS: Acuity of condition  Exam: Physical Exam HENT:     Head: Normocephalic.     Mouth/Throat:     Pharynx: No oropharyngeal exudate.  Eyes:     General: Lids are normal.     Conjunctiva/sclera: Conjunctivae normal.  Cardiovascular:     Rate and Rhythm: Normal rate and regular rhythm.     Heart sounds: Normal heart sounds, S1 normal and S2 normal.  Pulmonary:     Breath sounds: Normal breath sounds. No decreased breath sounds, wheezing, rhonchi or rales.  Abdominal:     Palpations: Abdomen is soft.     Tenderness: There is no abdominal tenderness.  Musculoskeletal:     Right lower leg: No swelling.     Left lower leg: No swelling.  Skin:    General: Skin is warm.     Findings: No rash.  Neurological:     Mental Status: She is lethargic.      Scheduled Meds:  vitamin C  500 mg Per Tube Daily   chlorhexidine  15 mL Mouth Rinse BID   Chlorhexidine Gluconate Cloth  6 each Topical Daily   diazepam  5 mg Intravenous Q8H   free water  100 mL Per Tube Q4H   insulin aspart  0-9 Units Subcutaneous Q4H   mouth rinse  15 mL Mouth Rinse q12n4p    thiothixene  2 mg Per Tube BID   Continuous Infusions:  feeding supplement (OSMOLITE 1.2 CAL) 1,000 mL (02/25/21 1226)    Assessment/Plan:  Failure to thrive.  Patient unable to eat with her mental status being impaired.  PEG tube placed yesterday by interventional radiology.  We will discontinue NG tube and use PEG tube for feedings and medications. Schizophrenia with catatonia.  Patient on native vein and increased dose of Valium without much response.  Psychiatry to consider ECT treatment next week. Type 2 diabetes mellitus with episodes of hypoglycemia.  DC D5 drip since restarting tube feeds.  On sliding scale insulin for right now. Severe sepsis, present on admission with aspiration pneumonia/UTI completed antibiotics.  This has resolved. COVID-19 infection on 02/07/2021.  Out of isolation Hyponatremia, hypokalemia and hypophosphatemia.  All improved with NG tube feeding Transaminitis.  Improved into the normal range Cardiomyopathy Sacral decubitus ulcer, present on admission see full description below Nonsustained ventricular tachycardia secondary to electrolyte abnormalities Acute kidney injury on presentation with creatinine of 1.42 and now down to 0.45. Severe protein calorie malnutrition  Pressure Injury 02/08/21 Sacrum Medial Stage 2 -  Partial thickness loss of dermis presenting as  a shallow open injury with a red, pink wound bed without slough. dry (Active)  02/08/21 0700  Location: Sacrum  Location Orientation: Medial  Staging: Stage 2 -  Partial thickness loss of dermis presenting as a shallow open injury with a red, pink wound bed without slough.  Wound Description (Comments): dry  Present on Admission: Yes       Code Status:     Code Status Orders  (From admission, onward)           Start     Ordered   02/07/21 1806  Full code  Continuous        02/07/21 1809           Code Status History     Date Active Date Inactive Code Status Order ID Comments  User Context   11/07/2020 2347 11/11/2020 2103 Full Code [id number]  Andris Baumann, MD ED   10/16/2017 0053 10/24/2017 1726 Full Code [id number]  Audery Amel, MD Inpatient   05/27/2017 0024 05/30/2017 1929 Full Code [id number]  Clapacs, Jackquline Denmark, MD Inpatient      Family Communication: Spoke with son on the phone Disposition Plan: Status is: Inpatient  Dispo: The patient is from: Rehab              Anticipated d/c is to: Rehab              Patient currently still basically catatonic.  If no improvement with medications may end up needing ECT treatment   Difficult to place patient.  No.  Consultants: Psychiatry Interventional radiology  Procedures: PEG tube placement on 02/24/2021  Time spent: 26 minutes, case discussed with nursing staff  Alford Highland  [facility]

## 2021-02-25 NOTE — Progress Notes (Signed)
NGT removed.  Tip intact.  Tolerated well.

## 2021-02-25 NOTE — Progress Notes (Signed)
Spoke with Dr. Richarda Overlie, in IR.  Ok to use for feedings, and meds.

## 2021-02-26 DIAGNOSIS — E1165 Type 2 diabetes mellitus with hyperglycemia: Secondary | ICD-10-CM

## 2021-02-26 DIAGNOSIS — R627 Adult failure to thrive: Secondary | ICD-10-CM | POA: Diagnosis not present

## 2021-02-26 DIAGNOSIS — F202 Catatonic schizophrenia: Secondary | ICD-10-CM | POA: Diagnosis not present

## 2021-02-26 DIAGNOSIS — E43 Unspecified severe protein-calorie malnutrition: Secondary | ICD-10-CM | POA: Diagnosis not present

## 2021-02-26 DIAGNOSIS — I10 Essential (primary) hypertension: Secondary | ICD-10-CM

## 2021-02-26 LAB — GLUCOSE, CAPILLARY
Glucose-Capillary: 169 mg/dL — ABNORMAL HIGH (ref 70–99)
Glucose-Capillary: 173 mg/dL — ABNORMAL HIGH (ref 70–99)
Glucose-Capillary: 175 mg/dL — ABNORMAL HIGH (ref 70–99)
Glucose-Capillary: 218 mg/dL — ABNORMAL HIGH (ref 70–99)
Glucose-Capillary: 244 mg/dL — ABNORMAL HIGH (ref 70–99)
Glucose-Capillary: 287 mg/dL — ABNORMAL HIGH (ref 70–99)

## 2021-02-26 MED ORDER — ENOXAPARIN SODIUM 40 MG/0.4ML IJ SOSY
40.0000 mg | PREFILLED_SYRINGE | INTRAMUSCULAR | Status: DC
  Administered 2021-02-26 – 2021-02-27 (×2): 40 mg via SUBCUTANEOUS
  Filled 2021-02-26 (×2): qty 0.4

## 2021-02-26 MED ORDER — INSULIN GLARGINE-YFGN 100 UNIT/ML ~~LOC~~ SOLN
6.0000 [IU] | Freq: Every day | SUBCUTANEOUS | Status: DC
  Administered 2021-02-26: 6 [IU] via SUBCUTANEOUS
  Filled 2021-02-26 (×3): qty 0.06

## 2021-02-26 MED ORDER — METOPROLOL TARTRATE 25 MG/10 ML ORAL SUSPENSION
12.5000 mg | Freq: Two times a day (BID) | ORAL | Status: DC
  Administered 2021-02-26 – 2021-03-22 (×44): 12.5 mg
  Filled 2021-02-26 (×4): qty 5
  Filled 2021-02-26: qty 20
  Filled 2021-02-26: qty 10
  Filled 2021-02-26 (×2): qty 5
  Filled 2021-02-26: qty 20
  Filled 2021-02-26 (×11): qty 5
  Filled 2021-02-26 (×2): qty 20
  Filled 2021-02-26 (×2): qty 5
  Filled 2021-02-26: qty 10
  Filled 2021-02-26 (×3): qty 5
  Filled 2021-02-26: qty 10
  Filled 2021-02-26 (×10): qty 5
  Filled 2021-02-26: qty 10
  Filled 2021-02-26 (×7): qty 5

## 2021-02-26 NOTE — Progress Notes (Signed)
Patient ID: Erica Mueller, female   DOB: 24-May-1951, 70 y.o.   MRN: [id number] [facility] PROGRESS NOTE  Erica Mueller YWV:[id number] DOB: 1950-11-15 DOA: 02/07/2021 PCP: [name]  HPI/Subjective: Patient seen this morning.  Still unable to talk.  Patient's nurse from yesterday stated that she did speak slightly after taking out the NG tube.  PEG tube placed Friday and feeds started yesterday.  Initially admitted with severe sepsis.  Objective: Vitals:   02/26/21 0754 02/26/21 1243  BP: (!) 151/61 (!) 151/62  Pulse: 96 (!) 103  Resp: 15 15  Temp: 98.8 F (37.1 C) 98.2 F (36.8 C)  SpO2: 97% 100%   No intake or output data in the 24 hours ending 02/26/21 1420 Filed Weights   02/20/21 0500 02/21/21 0358 02/26/21 0600  Weight: 50 kg 51.5 kg 48.2 kg    ROS: Review of Systems  Unable to perform ROS: Acuity of condition  Exam: Physical Exam HENT:     Head: Normocephalic.     Mouth/Throat:     Pharynx: No oropharyngeal exudate.  Eyes:     General: Lids are normal.     Conjunctiva/sclera: Conjunctivae normal.  Cardiovascular:     Rate and Rhythm: Normal rate and regular rhythm.     Heart sounds: Normal heart sounds, S1 normal and S2 normal.  Pulmonary:     Breath sounds: No decreased breath sounds, wheezing, rhonchi or rales.  Abdominal:     Palpations: Abdomen is soft.     Tenderness: There is no abdominal tenderness.  Musculoskeletal:     Right lower leg: No swelling.     Left lower leg: No swelling.  Skin:    General: Skin is warm.     Findings: No rash.  Neurological:     Mental Status: She is alert.     Comments: Patient able to look at me with her eyes but unable to speak.      Scheduled Meds:  vitamin C  500 mg Per Tube Daily   chlorhexidine  15 mL Mouth Rinse BID   Chlorhexidine Gluconate Cloth  6 each Topical Daily   diazepam  5 mg Intravenous Q8H   free water  100 mL Per Tube Q4H   insulin aspart  0-9 Units Subcutaneous Q4H   insulin  glargine-yfgn  6 Units Subcutaneous QHS   mouth rinse  15 mL Mouth Rinse q12n4p   metoprolol tartrate  12.5 mg Per Tube BID   thiothixene  2 mg Per Tube BID   Continuous Infusions:  feeding supplement (OSMOLITE 1.2 CAL) 1,000 mL (02/25/21 1226)    Assessment/Plan:  Schizophrenia with catatonia.  Psychiatry started thiothixene and Valium.  Psychiatry will reevaluate this week on whether ECT treatments will be needed or not. Failure to thrive.  Patient unable to eat with her mental status being impaired.  PEG tube placed on 02/24/2021.  Restarted tube feedings. Type 2 diabetes mellitus.  With tube feedings back on sugars are now high again.  Restart low-dose Semglee insulin. Severe sepsis, present on admission with aspiration pneumonia.  This is resolved COVID-19 infection on 02/07/2021.  Out of isolation at this point. Electrolyte abnormalities with hypernatremia, hypokalemia and hypophosphatemia.  All improved with NG tube feeding Transaminitis.  Improved into the normal range Cardiomyopathy. Sacral decubitus ulcer, present on admission see full description below Nonsustained ventricular tachycardia secondary to electrolyte abnormalities Acute kidney injury on presentation with creatinine 1.42 and now down to 0.45 Severe protein calorie malnutrition Hypertension and  tachycardia.  Restart low-dose metoprolol via NG tube  Pressure Injury 02/08/21 Sacrum Medial Stage 2 -  Partial thickness loss of dermis presenting as a shallow open injury with a red, pink wound bed without slough. dry (Active)  02/08/21 0700  Location: Sacrum  Location Orientation: Medial  Staging: Stage 2 -  Partial thickness loss of dermis presenting as a shallow open injury with a red, pink wound bed without slough.  Wound Description (Comments): dry  Present on Admission: Yes       Code Status:     Code Status Orders  (From admission, onward)           Start     Ordered   02/07/21 1806  Full code   Continuous        02/07/21 1809           Code Status History     Date Active Date Inactive Code Status Order ID Comments User Context   11/07/2020 2347 11/11/2020 2103 Full Code [id number]  Andris Baumann, MD ED   10/16/2017 0053 10/24/2017 1726 Full Code [id number]  Audery Amel, MD Inpatient   05/27/2017 0024 05/30/2017 1929 Full Code [id number]  Clapacs, Jackquline Denmark, MD Inpatient      Family Communication: Updated son yesterday Disposition Plan: Status is: Inpatient  Dispo: The patient is from: Rehab              Anticipated d/c is to: Rehab              Patient currently has PEG tube and restarted feeds.  Psychiatry to evaluate this week to determine whether ECT treatment is needed or not.   Difficult to place patient.  No.  Consultants: Psychiatry  Procedures: PEG tube placement  Time spent: 26 minutes  [employer]

## 2021-02-26 NOTE — Plan of Care (Signed)
  Problem: Education: Goal: Knowledge of General Education information will improve Description: Including pain rating scale, medication(s)/side effects and non-pharmacologic comfort measures Outcome: Progressing   Problem: Health Behavior/Discharge Planning: Goal: Ability to manage health-related needs will improve Outcome: Progressing   Problem: Clinical Measurements: Goal: Ability to maintain clinical measurements within normal limits will improve Outcome: Progressing Goal: Will remain free from infection Outcome: Progressing Goal: Diagnostic test results will improve Outcome: Progressing Goal: Respiratory complications will improve Outcome: Progressing Goal: Cardiovascular complication will be avoided Outcome: Progressing   Problem: Activity: Goal: Risk for activity intolerance will decrease Outcome: Progressing   Problem: Nutrition: Goal: Adequate nutrition will be maintained Outcome: Progressing   Problem: Coping: Goal: Level of anxiety will decrease Outcome: Progressing   Problem: Elimination: Goal: Will not experience complications related to bowel motility Outcome: Progressing Goal: Will not experience complications related to urinary retention Outcome: Progressing   Problem: Pain Managment: Goal: General experience of comfort will improve Outcome: Progressing   Problem: Safety: Goal: Ability to remain free from injury will improve Outcome: Progressing   Problem: Skin Integrity: Goal: Risk for impaired skin integrity will decrease Outcome: Progressing   Problem: Respiratory: Goal: Will maintain a patent airway Outcome: Progressing Goal: Complications related to the disease process, condition or treatment will be avoided or minimized Outcome: Progressing   

## 2021-02-26 NOTE — TOC Progression Note (Signed)
Transition of Care [facility]) - Progression Note    Patient Details  Name: Erica Mueller MRN: [id number] Date of Birth: 1950/08/15  [facility] CM/SW Contact  Bing Quarry, RN Phone Number: 02/26/2021, 1:25 PM  Clinical Narrative: Patient admitted 17 days ago with post Covid exacerbation of existing schizoaffective disorder and has been catatonic per consult notes. Tube feedings. PTA long term resident of [facility] L[facility]. Continue to monitor for disposition planning when medically stable. Gabriel Cirri RN CM       Expected Discharge Plan: Long Term Nursing Home Barriers to Discharge: Continued Medical Work up  Expected Discharge Plan and Services Expected Discharge Plan: Long Term Nursing Home In-house Referral: Clinical Social Work   Post Acute Care Choice: Nursing Home Living arrangements for the past 2 months:  ([facility] Long Term Care Facility)                                       Social Determinants of Health (SDOH) Interventions    Readmission Risk Interventions No flowsheet data found.

## 2021-02-26 NOTE — Plan of Care (Signed)
No acute events during the night. VSS. Tolerating TF well. Mouth care completed. Attempted to turn patient, but patient favors her right side.  Problem: Education: Goal: Knowledge of General Education information will improve Description: Including pain rating scale, medication(s)/side effects and non-pharmacologic comfort measures Outcome: Progressing   Problem: Health Behavior/Discharge Planning: Goal: Ability to manage health-related needs will improve Outcome: Progressing   Problem: Clinical Measurements: Goal: Ability to maintain clinical measurements within normal limits will improve Outcome: Progressing Goal: Will remain free from infection Outcome: Progressing Goal: Diagnostic test results will improve Outcome: Progressing Goal: Respiratory complications will improve Outcome: Progressing Goal: Cardiovascular complication will be avoided Outcome: Progressing   Problem: Activity: Goal: Risk for activity intolerance will decrease Outcome: Progressing   Problem: Nutrition: Goal: Adequate nutrition will be maintained Outcome: Progressing   Problem: Coping: Goal: Level of anxiety will decrease Outcome: Progressing   Problem: Elimination: Goal: Will not experience complications related to bowel motility Outcome: Progressing Goal: Will not experience complications related to urinary retention Outcome: Progressing   Problem: Pain Managment: Goal: General experience of comfort will improve Outcome: Progressing   Problem: Safety: Goal: Ability to remain free from injury will improve Outcome: Progressing   Problem: Skin Integrity: Goal: Risk for impaired skin integrity will decrease Outcome: Progressing   Problem: Respiratory: Goal: Will maintain a patent airway Outcome: Progressing Goal: Complications related to the disease process, condition or treatment will be avoided or minimized Outcome: Progressing

## 2021-02-27 ENCOUNTER — Inpatient Hospital Stay: Payer: Medicare Other | Admitting: Radiology

## 2021-02-27 DIAGNOSIS — R627 Adult failure to thrive: Secondary | ICD-10-CM | POA: Diagnosis not present

## 2021-02-27 DIAGNOSIS — K9423 Gastrostomy malfunction: Secondary | ICD-10-CM | POA: Diagnosis not present

## 2021-02-27 DIAGNOSIS — F061 Catatonic disorder due to known physiological condition: Secondary | ICD-10-CM | POA: Diagnosis not present

## 2021-02-27 DIAGNOSIS — F202 Catatonic schizophrenia: Secondary | ICD-10-CM | POA: Diagnosis not present

## 2021-02-27 DIAGNOSIS — U071 COVID-19: Secondary | ICD-10-CM | POA: Diagnosis not present

## 2021-02-27 HISTORY — PX: IR REPLC GASTRO/COLONIC TUBE PERCUT W/FLUORO: IMG2333

## 2021-02-27 LAB — BASIC METABOLIC PANEL
Anion gap: 6 (ref 5–15)
BUN: 18 mg/dL (ref 8–23)
CO2: 25 mmol/L (ref 22–32)
Calcium: 8.9 mg/dL (ref 8.9–10.3)
Chloride: 102 mmol/L (ref 98–111)
Creatinine, Ser: 0.54 mg/dL (ref 0.44–1.00)
GFR, Estimated: >60 mL/min (ref >60)
Glucose, Bld: 260 mg/dL — ABNORMAL HIGH (ref 70–99)
Potassium: 4.6 mmol/L (ref 3.5–5.1)
Sodium: 133 mmol/L — ABNORMAL LOW (ref 135–145)

## 2021-02-27 LAB — GLUCOSE, CAPILLARY
Glucose-Capillary: 118 mg/dL — ABNORMAL HIGH (ref 70–99)
Glucose-Capillary: 152 mg/dL — ABNORMAL HIGH (ref 70–99)
Glucose-Capillary: 207 mg/dL — ABNORMAL HIGH (ref 70–99)
Glucose-Capillary: 229 mg/dL — ABNORMAL HIGH (ref 70–99)
Glucose-Capillary: 252 mg/dL — ABNORMAL HIGH (ref 70–99)
Glucose-Capillary: 272 mg/dL — ABNORMAL HIGH (ref 70–99)
Glucose-Capillary: 281 mg/dL — ABNORMAL HIGH (ref 70–99)

## 2021-02-27 LAB — MAGNESIUM: Magnesium: 1.9 mg/dL (ref 1.7–2.4)

## 2021-02-27 LAB — PHOSPHORUS: Phosphorus: 3.9 mg/dL (ref 2.5–4.6)

## 2021-02-27 MED ORDER — DEXTROSE-NACL 5-0.9 % IV SOLN: INTRAVENOUS | Status: DC

## 2021-02-27 MED ORDER — IOHEXOL 300 MG/ML  SOLN
20.0000 mL | Freq: Once | INTRAMUSCULAR | Status: AC | PRN
  Administered 2021-02-27: 20 mL

## 2021-02-27 NOTE — Plan of Care (Signed)
  Problem: Education: Goal: Knowledge of General Education information will improve Description: Including pain rating scale, medication(s)/side effects and non-pharmacologic comfort measures Outcome: Not Progressing   Problem: Clinical Measurements: Goal: Ability to maintain clinical measurements within normal limits will improve Outcome: Not Progressing Goal: Will remain free from infection Outcome: Not Progressing Goal: Diagnostic test results will improve Outcome: Not Progressing Goal: Respiratory complications will improve Outcome: Not Progressing Goal: Cardiovascular complication will be avoided Outcome: Not Progressing   Problem: Elimination: Goal: Will not experience complications related to bowel motility Outcome: Not Progressing Goal: Will not experience complications related to urinary retention Outcome: Not Progressing

## 2021-02-27 NOTE — Progress Notes (Signed)
Inpatient Diabetes Program Recommendations  AACE/ADA: New Consensus Statement on Inpatient Glycemic Control (2015)  Target Ranges:  Prepandial:   less than 140 mg/dL      Peak postprandial:   less than 180 mg/dL (1-2 hours)      Critically ill patients:  140 - 180 mg/dL  Results for Erica Mueller, Erica Mueller (MRN [id number]) as of 02/27/2021 10:08  Ref. Range 02/26/2021 00:15 02/26/2021 03:46 02/26/2021 07:57 02/26/2021 12:44 02/26/2021 16:30 02/26/2021 20:24  Glucose-Capillary Latest Ref Range: 70 - 99 mg/dL 419 (H)  2 units Novolog  218 (H)  3 units Novolog  287 (H)  5 units Novolog  173 (H)  2 units Novolog 175 (H)  2 units Novolog  244 (H)  3 units Novolog  6 units Semglee  Results for Erica Mueller, Erica Mueller (MRN [id number]) as of 02/27/2021 10:08  Ref. Range 02/27/2021 00:33 02/27/2021 04:04 02/27/2021 07:17  Glucose-Capillary Latest Ref Range: 70 - 99 mg/dL 211 (H)  3 units Novolog  152 (H)  1 unit Novolog  281 (H)  5 units Novolog      Home DM Meds: Amaryl 2 mg daily        Metformin 1000 mg BID  Current Orders: Semglee 6 units QHS      Novolog Sensitive Correction Scale/ SSI (0-9 units) Q4 hours    MD- Note pt has tube feeds running 55cc/hr.  Note restart of Semglee insulin last PM.  May consider starting Novolog Tube Feed Coverage as well:  Novolog 3 units Q4 hours  HOLD if tube feeds HELD or stopped for any reason    --Will follow patient during hospitalization--  Ambrose Finland RN, MSN, CDE Diabetes Coordinator Inpatient Glycemic Control Team Team Pager: [phone] (8a-5p)

## 2021-02-27 NOTE — Consult Note (Signed)
  This 70 year old woman was seen, being followed on consult service. When writer approached patient in the room, her eyes were open and she was able to follow with her eyes as I approached her bedside. NG tube removed on 02/25/21. When patient was asked how she was feeling or asked to respond with movements of her head, patient did not respond at all. She closed her eyes for the duration of the visit., as Clinical research associate attempted to make light conversation. Patient's siblings came in to the room to visit and were also unable to get her to make attempts at communication. Will continue to follow.

## 2021-02-27 NOTE — Progress Notes (Signed)
Patient ID: Erica Mueller, female   DOB: January 07, 1951, 70 y.o.   MRN: [id number] [facility] PROGRESS NOTE  Erica Mueller NWG:[id number] DOB: Mar 23, 1951 DOA: 02/07/2021 PCP: [facility]  HPI/Subjective: Notified by nursing staff that the tube feeds are leaking in the bed.  The edge of the PEG tube had a crack in it.  Objective: Vitals:   02/27/21 0719 02/27/21 1149  BP: (!) 124/59 (!) 125/48  Pulse: 99 92  Resp: 16 15  Temp: (!) 97 F (36.1 C) 98.8 F (37.1 C)  SpO2: 100% 96%    Intake/Output Summary (Last 24 hours) at 02/27/2021 1448 Last data filed at 02/27/2021 1407 Gross per 24 hour  Intake --  Output 150 ml  Net -150 ml   Filed Weights   02/21/21 0358 02/26/21 0600 02/27/21 0500  Weight: 51.5 kg 48.2 kg 45.1 kg    ROS: Review of Systems  Unable to perform ROS: Acuity of condition  Exam: Physical Exam HENT:     Head: Normocephalic.     Mouth/Throat:     Comments: Unable to look into mouth Eyes:     General: Lids are normal.     Conjunctiva/sclera: Conjunctivae normal.  Cardiovascular:     Rate and Rhythm: Normal rate and regular rhythm.     Heart sounds: Normal heart sounds, S1 normal and S2 normal.  Pulmonary:     Breath sounds: Examination of the right-lower field reveals decreased breath sounds. Examination of the left-lower field reveals decreased breath sounds. Decreased breath sounds present. No wheezing, rhonchi or rales.  Abdominal:     Palpations: Abdomen is soft.     Tenderness: There is no abdominal tenderness.  Musculoskeletal:     Right lower leg: No swelling.     Left lower leg: No swelling.  Skin:    General: Skin is warm.     Findings: No rash.  Neurological:     Comments: Able to open eyes but still unable to talk.      Scheduled Meds:  vitamin C  500 mg Per Tube Daily   chlorhexidine  15 mL Mouth Rinse BID   Chlorhexidine Gluconate Cloth  6 each Topical Daily   diazepam  5 mg Intravenous Q8H   enoxaparin (LOVENOX)  injection  40 mg Subcutaneous Q24H   free water  100 mL Per Tube Q4H   insulin aspart  0-9 Units Subcutaneous Q4H   insulin glargine-yfgn  6 Units Subcutaneous QHS   mouth rinse  15 mL Mouth Rinse q12n4p   metoprolol tartrate  12.5 mg Per Tube BID   thiothixene  2 mg Per Tube BID    Assessment/Plan:  PEG tube malfunction with tube feeds leaking in the bed.  Spoke with IR and they will come evaluate.  Nursing staff noticed this morning that the edge of the PEG tube was cracked and tube feeds were leaking.  Kept n.p.o. from this morning.  We will start IV fluids. Schizophrenia with catatonia.  Case discussed with Dr. Toni Mueller psychiatry and he will speak with the patient's son about ECT treatments.  Continue thiothixene and Valium. Failure to thrive.  Patient unable to eat with her mental status being impaired.  PEG tube placed on 02/24/2021 by interventional radiology.  Had to hold tube feedings today with PEG tube malfunction and tube feedings leaking into the bed. COVID-19 infection on 02/07/2021.  Out of isolation at this point. Severe sepsis, present on admission with aspiration pneumonia.  This has resolved. Electrolyte  abnormalities during the hospital course with hypernatremia, hypokalemia and hypophosphatemia.  All improved with NG tube feeding. Transaminitis.  Improved into the normal range. Cardiomyopathy. Sacral decubitus ulcer, present on admission.  See full description below Previous nonsustained ventricular tachycardia secondary to electrolyte abnormalities.  On low-dose metoprolol Severe protein calorie malnutrition Hypertension tachycardia on low-dose metoprolol via NG tube Acute kidney injury on presentation creatinine 1.42.  Now down to 0.54. Type 2 diabetes mellitus.  Last hemoglobin A1c 7.9.  Sugars elevated with tube feedings.  With holding tube feedings today we will hold long-acting insulin tonight and just go sliding scale.  Started on IV fluids.  Pressure Injury  02/08/21 Sacrum Medial Stage 2 -  Partial thickness loss of dermis presenting as a shallow open injury with a red, pink wound bed without slough. dry (Active)  02/08/21 0700  Location: Sacrum  Location Orientation: Medial  Staging: Stage 2 -  Partial thickness loss of dermis presenting as a shallow open injury with a red, pink wound bed without slough.  Wound Description (Comments): dry  Present on Admission: Yes       Code Status:     Code Status Orders  (From admission, onward)           Start     Ordered   02/07/21 1806  Full code  Continuous        02/07/21 1809           Code Status History     Date Active Date Inactive Code Status Order ID Comments User Context   11/07/2020 2347 11/11/2020 2103 Full Code [id number]  Andris Baumann, MD ED   10/16/2017 0053 10/24/2017 1726 Full Code [id number]  Audery Amel, MD Inpatient   05/27/2017 0024 05/30/2017 1929 Full Code [id number]  Clapacs, Jackquline Denmark, MD Inpatient      Family Communication: Spoke with son on the phone Disposition Plan: Status is: Inpatient  Dispo: The patient is from: Rehab              Anticipated d/c is to: Rehab              Patient currently having peg tube malfunction and still catatonic   Difficult to place patient.  No.  Consultants: Interventional radiology Psychiatry  Procedures: PEG tube placement on 02/24/2021  Time spent: 27 minutes, case discussed with psychiatry and interventional radiology and nursing staff  [facility]  [facility]

## 2021-02-27 NOTE — Plan of Care (Signed)
No acute events during the night. NAD noted. VSS. Tolerating TF well. Remains in a catatonic state.  Problem: Education: Goal: Knowledge of General Education information will improve Description: Including pain rating scale, medication(s)/side effects and non-pharmacologic comfort measures Outcome: Progressing   Problem: Health Behavior/Discharge Planning: Goal: Ability to manage health-related needs will improve Outcome: Progressing   Problem: Clinical Measurements: Goal: Ability to maintain clinical measurements within normal limits will improve Outcome: Progressing Goal: Will remain free from infection Outcome: Progressing Goal: Diagnostic test results will improve Outcome: Progressing Goal: Respiratory complications will improve Outcome: Progressing Goal: Cardiovascular complication will be avoided Outcome: Progressing   Problem: Activity: Goal: Risk for activity intolerance will decrease Outcome: Progressing   Problem: Nutrition: Goal: Adequate nutrition will be maintained Outcome: Progressing   Problem: Coping: Goal: Level of anxiety will decrease Outcome: Progressing   Problem: Elimination: Goal: Will not experience complications related to bowel motility Outcome: Progressing Goal: Will not experience complications related to urinary retention Outcome: Progressing   Problem: Pain Managment: Goal: General experience of comfort will improve Outcome: Progressing   Problem: Safety: Goal: Ability to remain free from injury will improve Outcome: Progressing   Problem: Skin Integrity: Goal: Risk for impaired skin integrity will decrease Outcome: Progressing   Problem: Respiratory: Goal: Will maintain a patent airway Outcome: Progressing Goal: Complications related to the disease process, condition or treatment will be avoided or minimized Outcome: Progressing

## 2021-02-28 DIAGNOSIS — U071 COVID-19: Secondary | ICD-10-CM | POA: Diagnosis not present

## 2021-02-28 DIAGNOSIS — R627 Adult failure to thrive: Secondary | ICD-10-CM | POA: Diagnosis not present

## 2021-02-28 DIAGNOSIS — K9423 Gastrostomy malfunction: Secondary | ICD-10-CM | POA: Diagnosis not present

## 2021-02-28 DIAGNOSIS — F202 Catatonic schizophrenia: Secondary | ICD-10-CM | POA: Diagnosis not present

## 2021-02-28 LAB — GLUCOSE, CAPILLARY
Glucose-Capillary: 153 mg/dL — ABNORMAL HIGH (ref 70–99)
Glucose-Capillary: 162 mg/dL — ABNORMAL HIGH (ref 70–99)
Glucose-Capillary: 181 mg/dL — ABNORMAL HIGH (ref 70–99)
Glucose-Capillary: 185 mg/dL — ABNORMAL HIGH (ref 70–99)
Glucose-Capillary: 212 mg/dL — ABNORMAL HIGH (ref 70–99)
Glucose-Capillary: 96 mg/dL (ref 70–99)

## 2021-02-28 MED ORDER — FREE WATER
30.0000 mL | Status: DC
  Administered 2021-02-28 – 2021-03-07 (×35): 30 mL

## 2021-02-28 MED ORDER — ENOXAPARIN SODIUM 40 MG/0.4ML IJ SOSY: 40.0000 mg | PREFILLED_SYRINGE | INTRAMUSCULAR | Status: DC

## 2021-02-28 MED ORDER — OSMOLITE 1.2 CAL PO LIQD
1000.0000 mL | ORAL | Status: DC
  Administered 2021-02-28 – 2021-03-07 (×7): 1000 mL

## 2021-02-28 MED ORDER — ENOXAPARIN SODIUM 40 MG/0.4ML IJ SOSY
40.0000 mg | PREFILLED_SYRINGE | INTRAMUSCULAR | Status: DC
  Administered 2021-02-28 – 2021-03-29 (×29): 40 mg via SUBCUTANEOUS
  Filled 2021-02-28 (×29): qty 0.4

## 2021-02-28 MED ORDER — INSULIN GLARGINE-YFGN 100 UNIT/ML ~~LOC~~ SOLN
4.0000 [IU] | Freq: Every day | SUBCUTANEOUS | Status: DC
  Administered 2021-02-28: 4 [IU] via SUBCUTANEOUS
  Filled 2021-02-28 (×2): qty 0.04

## 2021-02-28 MED ORDER — INSULIN GLARGINE-YFGN 100 UNIT/ML ~~LOC~~ SOLN
6.0000 [IU] | Freq: Every day | SUBCUTANEOUS | Status: DC
  Filled 2021-02-28: qty 0.06

## 2021-02-28 NOTE — Procedures (Signed)
Pre procedural Dx: Dysphagia, cracked feeding tube. Post procedural Dx: Same  Successful fluoroscopic guided replacement of existing 18 Fr gastrostomy tube.   The feeding tube is ready for immediate use.  EBL: None  Complications: None immediate.  Katherina Right, MD Pager #: [phone]

## 2021-02-28 NOTE — Progress Notes (Addendum)
MEDICATION-RELATED CONSULT NOTE   IR Procedure Consult - Anticoagulant/Antiplatelet PTA/Inpatient Med List Review by Pharmacist    Procedure: replacement of existing 18 Fr gastrostomy tube    Completed: 02/27/21  1616  Post-Procedural bleeding risk per IR MD assessment:   Low   Antithrombotic medications on inpatient or PTA profile prior to procedure:   LMWH 40 q24h.  Last dose 02/27/21 at 1951    Recommended restart time per IR Post-Procedure Guidelines:     (LMWH) low molecular weight heparin  (prophylactic)  Day 0  (at least 4 hours or at next standard dose interval)      Other considerations:      Plan:    continue Lovenox as ordered- next dose due 02/28/21 2000

## 2021-02-28 NOTE — Consult Note (Signed)
[facility] Face-to-Face Psychiatry Consult   Reason for Consult: Follow-up consult 70 year old woman with apparent catatonia Referring Physician:  Renae Gloss Patient Identification: Erica Mueller MRN:  [id number] Principal Diagnosis: Catatonia Diagnosis:  Principal Problem:   Catatonia Active Problems:   Type 2 diabetes mellitus with hyperglycemia, without long-term current use of insulin (HCC)   Essential hypertension   Schizophrenia (HCC)   Sepsis (HCC)   Pressure injury of skin   Protein-calorie malnutrition, severe   Failure to thrive in adult   Hypernatremia   Hypokalemia   AKI (acute kidney injury) (HCC)   COVID-19 virus infection   PEG tube malfunction (HCC)   Total Time spent with patient: 30 minutes  Subjective:   Erica Mueller is a 70 y.o. female patient admitted with patient nonverbal.  HPI: Patient with a history of schizophrenia who has been nonverbal and minimally responsive for an extended period of time.  Not eating not responding verbally.  Diagnosis most likely catatonia.  Patient has not shown response so far with medication  Past Psychiatric History: Past history with chronic recurrent psychotic disorder  Risk to Self:   Risk to Others:   Prior Inpatient Therapy:   Prior Outpatient Therapy:    Past Medical History:  Past Medical History:  Diagnosis Date   Coronary artery disease    Depression    Diabetes mellitus without complication (HCC)    Elevated lipids    GERD (gastroesophageal reflux disease)    Hypertension    Schizophrenia (HCC)    Syphilis (acquired)     Past Surgical History:  Procedure Laterality Date   COLONOSCOPY     COLONOSCOPY WITH PROPOFOL N/A 09/01/2015   Procedure: COLONOSCOPY WITH PROPOFOL;  Surgeon: Wallace Cullens, MD;  Location: [facility] ENDOSCOPY;  Service: Gastroenterology;  Laterality: N/A;   IR FLUORO RM 30-60 MIN  02/23/2021   IR GASTROSTOMY TUBE MOD SED  02/24/2021   IR REPLC GASTRO/COLONIC TUBE PERCUT W/FLUORO  02/27/2021   TUBAL  LIGATION     wisdom teeth removal     Family History:  Family History  Problem Relation Age of Onset   Cancer Mother    Alcoholism Mother    Bone cancer Father    Heart disease Father    Diabetes Maternal Grandmother    Suicidality Paternal Uncle    Breast cancer Neg Hx    Family Psychiatric  History: See previous Social History:  Social History   Substance and Sexual Activity  Alcohol Use Yes   Alcohol/week: 3.0 standard drinks   Types: 2 Cans of beer, 1 Shots of liquor per week     Social History   Substance and Sexual Activity  Drug Use Not Currently   Types: Marijuana   Comment: PAST     Social History   Socioeconomic History   Marital status: Divorced    Spouse name: Not on file   Number of children: Not on file   Years of education: Not on file   Highest education level: Not on file  Occupational History   Not on file  Tobacco Use   Smoking status: Every Day    Packs/day: 1.00    Years: 48.00    Pack years: 48.00    Types: Cigarettes    Start date: 12/20/1969   Smokeless tobacco: Never  Vaping Use   Vaping Use: Never used  Substance and Sexual Activity   Alcohol use: Yes    Alcohol/week: 3.0 standard drinks    Types: 2 Cans of  beer, 1 Shots of liquor per week   Drug use: Not Currently    Types: Marijuana    Comment: PAST    Sexual activity: Not Currently    Birth control/protection: None  Other Topics Concern   Not on file  Social History Narrative   Not on file   Social Determinants of Health   Financial Resource Strain: Not on file  Food Insecurity: Not on file  Transportation Needs: Not on file  Physical Activity: Not on file  Stress: Not on file  Social Connections: Not on file   Additional Social History:    Allergies:  No Known Allergies  Labs:  Results for orders placed or performed during the hospital encounter of 02/07/21 (from the past 48 hour(s))  Glucose, capillary     Status: Abnormal   Collection Time: 02/26/21  8:24  PM  Result Value Ref Range   Glucose-Capillary 244 (H) 70 - 99 mg/dL    Comment: Glucose reference range applies only to samples taken after fasting for at least 8 hours.  Glucose, capillary     Status: Abnormal   Collection Time: 02/27/21 12:33 AM  Result Value Ref Range   Glucose-Capillary 229 (H) 70 - 99 mg/dL    Comment: Glucose reference range applies only to samples taken after fasting for at least 8 hours.  Glucose, capillary     Status: Abnormal   Collection Time: 02/27/21  4:04 AM  Result Value Ref Range   Glucose-Capillary 152 (H) 70 - 99 mg/dL    Comment: Glucose reference range applies only to samples taken after fasting for at least 8 hours.  Basic metabolic panel     Status: Abnormal   Collection Time: 02/27/21  6:31 AM  Result Value Ref Range   Sodium 133 (L) 135 - 145 mmol/L   Potassium 4.6 3.5 - 5.1 mmol/L   Chloride 102 98 - 111 mmol/L   CO2 25 22 - 32 mmol/L   Glucose, Bld 260 (H) 70 - 99 mg/dL    Comment: Glucose reference range applies only to samples taken after fasting for at least 8 hours.   BUN 18 8 - 23 mg/dL   Creatinine, Ser 6.37 0.44 - 1.00 mg/dL   Calcium 8.9 8.9 - 85.8 mg/dL   GFR, Estimated >85 >02 mL/min    Comment: (NOTE) Calculated using the CKD-EPI Creatinine Equation (2021)    Anion gap 6 5 - 15    Comment: Performed at [facility], [address], [place], [place] [address]  Magnesium     Status: None   Collection Time: 02/27/21  6:31 AM  Result Value Ref Range   Magnesium 1.9 1.7 - 2.4 mg/dL    Comment: Performed at [facility], [address] [place], [place] [address]  Phosphorus     Status: None   Collection Time: 02/27/21  6:31 AM  Result Value Ref Range   Phosphorus 3.9 2.5 - 4.6 mg/dL    Comment: Performed at [facility], [address] [place], [place] [address]  Glucose, capillary     Status: Abnormal   Collection Time: 02/27/21  7:17 AM  Result Value Ref Range   Glucose-Capillary 281 (H) 70  - 99 mg/dL    Comment: Glucose reference range applies only to samples taken after fasting for at least 8 hours.   Comment 1 Notify RN    Comment 2 Document in Chart   Glucose, capillary     Status: Abnormal  Collection Time: 02/27/21 11:49 AM  Result Value Ref Range   Glucose-Capillary 207 (H) 70 - 99 mg/dL    Comment: Glucose reference range applies only to samples taken after fasting for at least 8 hours.   Comment 1 Notify RN    Comment 2 Document in Chart   Glucose, capillary     Status: Abnormal   Collection Time: 02/27/21  3:41 PM  Result Value Ref Range   Glucose-Capillary 118 (H) 70 - 99 mg/dL    Comment: Glucose reference range applies only to samples taken after fasting for at least 8 hours.   Comment 1 Notify RN    Comment 2 Document in Chart   Glucose, capillary     Status: Abnormal   Collection Time: 02/27/21  7:47 PM  Result Value Ref Range   Glucose-Capillary 252 (H) 70 - 99 mg/dL    Comment: Glucose reference range applies only to samples taken after fasting for at least 8 hours.  Glucose, capillary     Status: Abnormal   Collection Time: 02/27/21 11:33 PM  Result Value Ref Range   Glucose-Capillary 272 (H) 70 - 99 mg/dL    Comment: Glucose reference range applies only to samples taken after fasting for at least 8 hours.  Glucose, capillary     Status: Abnormal   Collection Time: 02/28/21  3:43 AM  Result Value Ref Range   Glucose-Capillary 185 (H) 70 - 99 mg/dL    Comment: Glucose reference range applies only to samples taken after fasting for at least 8 hours.   Comment 1 Notify RN   Glucose, capillary     Status: None   Collection Time: 02/28/21  8:03 AM  Result Value Ref Range   Glucose-Capillary 96 70 - 99 mg/dL    Comment: Glucose reference range applies only to samples taken after fasting for at least 8 hours.  Glucose, capillary     Status: Abnormal   Collection Time: 02/28/21 12:48 PM  Result Value Ref Range   Glucose-Capillary 153 (H) 70 - 99  mg/dL    Comment: Glucose reference range applies only to samples taken after fasting for at least 8 hours.  Glucose, capillary     Status: Abnormal   Collection Time: 02/28/21  4:26 PM  Result Value Ref Range   Glucose-Capillary 162 (H) 70 - 99 mg/dL    Comment: Glucose reference range applies only to samples taken after fasting for at least 8 hours.    Current Facility-Administered Medications  Medication Dose Route Frequency Provider Last Rate Last Admin   acetaminophen (TYLENOL) suppository 650 mg  650 mg Rectal Q4H PRN Jimmye Norman, NP       acetaminophen (TYLENOL) tablet 650 mg  650 mg Oral Q4H PRN Harlon Ditty D, NP       ascorbic acid (VITAMIN C) tablet 500 mg  500 mg Per Tube Daily Charise Killian, MD   500 mg at 02/28/21 1011   chlorhexidine (PERIDEX) 0.12 % solution 15 mL  15 mL Mouth Rinse BID Alberteen Sam, MD   15 mL at 02/28/21 1011   Chlorhexidine Gluconate Cloth 2 % PADS 6 each  6 each Topical Daily Erin Fulling, MD   6 each at 02/26/21 1013   docusate sodium (COLACE) capsule 100 mg  100 mg Oral BID PRN Judithe Modest, NP       enoxaparin (LOVENOX) injection 40 mg  40 mg Subcutaneous Q24H Angelique Blonder, [facility]  feeding supplement (OSMOLITE 1.2 CAL) liquid 1,000 mL  1,000 mL Per Tube Continuous Alford Highland, MD 55 mL/hr at 02/28/21 1506 1,000 mL at 02/28/21 1506   free water 30 mL  30 mL Per Tube Q4H Alford Highland, MD   30 mL at 02/28/21 1506   glucagon (human recombinant) (GLUCAGEN) injection    PRN Roanna Banning, MD   0.5 mg at 02/24/21 1225   insulin aspart (novoLOG) injection 0-9 Units  0-9 Units Subcutaneous Q4H Alford Highland, MD   2 Units at 02/28/21 1632   insulin glargine-yfgn (SEMGLEE) injection 4 Units  4 Units Subcutaneous QHS Wieting, Richard, MD       MEDLINE mouth rinse  15 mL Mouth Rinse q12n4p Danford, Earl Lites, MD   15 mL at 02/28/21 1632   metoprolol tartrate (LOPRESSOR) 25 mg/10 mL oral suspension 12.5 mg   12.5 mg Per Tube BID Alford Highland, MD   12.5 mg at 02/28/21 1011   morphine 2 MG/ML injection 2 mg  2 mg Intravenous Q4H PRN Jimmye Norman, NP       ondansetron [facility]) injection 4 mg  4 mg Intravenous Q6H PRN Judithe Modest, NP       polyethylene glycol (MIRALAX / GLYCOLAX) packet 17 g  17 g Oral Daily PRN Judithe Modest, NP       thiothixene (NAVANE) capsule 2 mg  2 mg Per Tube BID Kimberly Nieland, Jackquline Denmark, MD   2 mg at 02/28/21 1011    Musculoskeletal: Strength & Muscle Tone: decreased Gait & Station: unable to stand Patient leans: N/A            Psychiatric Specialty Exam:  Presentation  General Appearance:  No data recorded Eye Contact: No data recorded Speech: No data recorded Speech Volume: No data recorded Handedness: No data recorded  Mood and Affect  Mood: No data recorded Affect: No data recorded  Thought Process  Thought Processes: No data recorded Descriptions of Associations:No data recorded Orientation:No data recorded Thought Content:No data recorded History of Schizophrenia/Schizoaffective disorder:No data recorded Duration of Psychotic Symptoms:No data recorded Hallucinations:No data recorded Ideas of Reference:No data recorded Suicidal Thoughts:No data recorded Homicidal Thoughts:No data recorded  Sensorium  Memory: No data recorded Judgment: No data recorded Insight: No data recorded  Executive Functions  Concentration: No data recorded Attention Span: No data recorded Recall: No data recorded Fund of Knowledge: No data recorded Language: No data recorded  Psychomotor Activity  Psychomotor Activity: No data recorded  Assets  Assets: No data recorded  Sleep  Sleep: No data recorded  Physical Exam: Physical Exam Vitals and nursing note reviewed.  Constitutional:      Appearance: She is ill-appearing.  HENT:     Head: Normocephalic and atraumatic.     Mouth/Throat:     Pharynx: Oropharynx is  clear.  Eyes:     Pupils: Pupils are equal, round, and reactive to light.  Cardiovascular:     Rate and Rhythm: Normal rate and regular rhythm.  Pulmonary:     Effort: Pulmonary effort is normal.     Breath sounds: Normal breath sounds.  Abdominal:     General: Abdomen is flat.     Palpations: Abdomen is soft.  Musculoskeletal:        General: Normal range of motion.  Skin:    General: Skin is warm and dry.  Psychiatric:        Attention and Perception: She is inattentive.  Speech: She is noncommunicative.   Review of Systems  Unable to perform ROS: Patient unresponsive  Blood pressure (!) 136/53, pulse 93, temperature 98.1 F (36.7 C), resp. rate 17, height 5\' 2"  (1.575 m), weight 45.1 kg, SpO2 98 %. Body mass index is 18.19 kg/m.  Treatment Plan Summary: Plan patient with apparent catatonia unresponsive with no improvement with benzodiazepines.  Not able to eat now has a PEG tube.  Despite debilitated status labs remain fairly stable.  Most likely treatment to help would be electroconvulsive therapy.  I spoke with her son by telephone and explained the situation and the treatment.  He gave verbal consent.  We will need to repeat this tomorrow on the phone for official verbal consent since he is not able to sign papers locally.  I have gone ahead and put in an order for n.p.o. tonight.  We will communicate with the ECT team that we will plan on beginning electroconvulsive therapy tomorrow.  Disposition:  See note  Mordecai Rasmussen, MD 02/28/2021 5:26 PM

## 2021-02-28 NOTE — Plan of Care (Signed)
  Problem: Education: Goal: Knowledge of General Education information will improve Description: Including pain rating scale, medication(s)/side effects and non-pharmacologic comfort measures Outcome: Not Progressing   Problem: Health Behavior/Discharge Planning: Goal: Ability to manage health-related needs will improve Outcome: Not Progressing   Problem: Elimination: Goal: Will not experience complications related to bowel motility Outcome: Not Progressing Goal: Will not experience complications related to urinary retention Outcome: Not Progressing   Problem: Pain Managment: Goal: General experience of comfort will improve Outcome: Not Progressing

## 2021-02-28 NOTE — Consult Note (Signed)
Psychiatry consult: Patient continues to be minimally responsive and dependent on a feeding tube.  Unable to engage in conversation.  Appears to have features of catatonia.  I have attempted to call her son at the telephone number provided in the chart and have only been able to leave a voicemail message.  I have left a message with my cell phone number so that we can discuss whether electroconvulsive therapy would be an option they would be wanting to pursue if it is appropriate.

## 2021-02-28 NOTE — Progress Notes (Signed)
Nutrition Follow-up  DOCUMENTATION CODES:   Severe malnutrition in context of social or environmental circumstances  INTERVENTION:   Restart Osmolite 1.2 @55ml /hr  Free water flushes 69m q4 hours  Regimen will provide 1584kcal, 73g of protein, and 12661mday free water   NUTRITION DIAGNOSIS:   Severe Malnutrition related to social / environmental circumstances as evidenced by severe fat depletion, severe muscle depletion. -ongoing   GOAL:   Patient will meet greater than or equal to 90% of their needs -met with tube feeds   MONITOR:   Labs, Weight trends, TF tolerance, Skin, I & O's  ASSESSMENT:   [age].o. female with medical history of HTN, DM, schizophrenia, depression, CAD, GERD, recent COVID 1974nd HLD who is admitted with aspiration PNA and sepsis.  Pt s/p IR exchange of 31F G-tube today   G-tube cracked and leaking yesterday; tube was exchanged today. Will plan to restart tube feeds at 1600 this evening. Refeed labs stable. Discontinue IVF. Per chart, pt is down ~11lbs(10%) since admission; RD unsure if bed weights are correct. Plan is for SNF at discharge.   Medications reviewed and include: vitamin C, lovenox, insulin, NaCl w/ 5% dextrose @50ml /hr  Labs reviewed: Na 133(L), K 4.6 wnl, P 3.9 wnl, Mg 1.9 wnl Wbc- 3.8(L) Cbgs- 185, 96, 153 x 24 hrs  Diet Order:   Diet Order             Diet NPO time specified  Diet effective now                  EDUCATION NEEDS:  No education needs have been identified at this time  Skin:  Skin Assessment: Skin Integrity Issues: Skin Integrity Issues:: Stage II Stage II: sacrum  Last BM:  9/18  Height:  Ht Readings from Last 1 Encounters:  02/07/21 5' 2"  (1.575 m)   Weight:  Wt Readings from Last 1 Encounters:  02/27/21 45.1 kg   Ideal Body Weight:  50 kg  BMI:  Body mass index is 18.19 kg/m.  Estimated Nutritional Needs:   Kcal:  1500-1700 kcal/d  Protein:  75-85 g/d  Fluid:   1.2-1.4L/day  CaKoleen DistanceS, RD, LDN Please refer to [facility] and/or RD on-call/weekend/after hours pager

## 2021-02-28 NOTE — Progress Notes (Signed)
Patient ID: Erica Mueller, female   DOB: May 29, 1951, 70 y.o.   MRN: [id number] [facility] PROGRESS NOTE  Erica Mueller YIR:[id number] DOB: 07-06-1950 DOA: 02/07/2021 PCP: [name]  HPI/Subjective: Patient open eyes and looks at me.  Unable to talk.  Unable to open mouth.  Admitted 21 days ago with severe sepsis and COVID-19 infection.  Objective: Vitals:   02/28/21 0339 02/28/21 0909  BP: (!) 153/62 (!) 128/47  Pulse: 92 97  Resp: (!) 22 16  Temp: 97.6 F (36.4 C) 97.9 F (36.6 C)  SpO2: 98% 97%    Intake/Output Summary (Last 24 hours) at 02/28/2021 1529 Last data filed at 02/28/2021 0910 Gross per 24 hour  Intake --  Output 550 ml  Net -550 ml   Filed Weights   02/21/21 0358 02/26/21 0600 02/27/21 0500  Weight: 51.5 kg 48.2 kg 45.1 kg    ROS: Review of Systems  Unable to perform ROS: Acuity of condition  Exam: Physical Exam HENT:     Head: Normocephalic.     Mouth/Throat:     Pharynx: No oropharyngeal exudate.  Eyes:     General: Lids are normal.     Conjunctiva/sclera: Conjunctivae normal.  Cardiovascular:     Rate and Rhythm: Normal rate and regular rhythm.     Heart sounds: Normal heart sounds, S1 normal and S2 normal.  Pulmonary:     Breath sounds: Examination of the right-lower field reveals decreased breath sounds. Examination of the left-lower field reveals decreased breath sounds. Decreased breath sounds present. No wheezing, rhonchi or rales.  Abdominal:     Palpations: Abdomen is soft.     Tenderness: There is no abdominal tenderness.  Musculoskeletal:     Right lower leg: No swelling.     Left lower leg: No swelling.  Skin:    General: Skin is warm.     Findings: No rash.  Neurological:     Mental Status: She is lethargic.     Comments: Opens eyes and looks around      Scheduled Meds:  vitamin C  500 mg Per Tube Daily   chlorhexidine  15 mL Mouth Rinse BID   Chlorhexidine Gluconate Cloth  6 each Topical Daily   enoxaparin  (LOVENOX) injection  40 mg Subcutaneous Q24H   free water  30 mL Per Tube Q4H   insulin aspart  0-9 Units Subcutaneous Q4H   mouth rinse  15 mL Mouth Rinse q12n4p   metoprolol tartrate  12.5 mg Per Tube BID   thiothixene  2 mg Per Tube BID   Continuous Infusions:  feeding supplement (OSMOLITE 1.2 CAL) 1,000 mL (02/28/21 1506)   Brief history.  Patient admitted 02/07/2021 with severe sepsis COVID-19 infection.  Past medical history of schizophrenia, hypertension, GERD, type 2 diabetes mellitus and depression and CAD.  The patient had a prolonged hospital course and never regained mental status.  She was treated for electrolyte abnormalities including hypernatremia, hypokalemia and hypophosphatemia.  She had an NG tube for 2 weeks and then a PEG tube was placed on 02/24/2021.  Yesterday there was a PEG tube malfunction with the tube being cracked and tube feeds leaking in the bed.  IR was able to change over the guidewire.  Seen by psychiatry and restarted on psychiatric medications but still catatonic.  Psychiatry to speak with family about potential ECT treatment.  Assessment/Plan:  PEG tube malfunction with cracked tube and tube feeds leaking in the bed.  IR yesterday replaced the  PEG tube via guidewire.  Restarting PEG tubes this afternoon.  Dietary following.  Discontinue IV fluids. Schizophrenia with catatonia.  Dr. Toni Amend will speak with son about ECT treatments.  Mental status has not improved with restarting thiothixene and Valium. Failure to thrive.  Patient unable to eat with her mental status being impaired.  PEG tube placed on 02/24/2021 by interventional radiology.  Secondary to PEG tube malformation with crack in the tube and tube feeds leaking on the bed IR had to replace the PEG tube on 02/27/2021. COVID-19 infection on 02/07/2021.  Out of isolation. Severe sepsis, present on admission with aspiration pneumonia.  This has resolved Electrolyte abnormalities during the hospital course  with hypernatremia, hypokalemia and hypophosphatemia.  All improved with NG tube feeding. Transaminitis.  Improved into the normal range Cardiomyopathy Sacral decubitus ulcer, present on admission.  See full description below Previous nonsustained ventricular tachycardia secondary to electrolyte abnormalities.  On low-dose metoprolol. Severe protein calorie malnutrition Hypertension tachycardia on low-dose metoprolol via PEG tube Type 2 diabetes mellitus.  Last hemoglobin A1c 7.9.  With restarting tube feedings I will give low-dose long-acting insulin at night.  Sliding scale insulin.  Pressure Injury 02/08/21 Sacrum Medial Stage 2 -  Partial thickness loss of dermis presenting as a shallow open injury with a red, pink wound bed without slough. dry (Active)  02/08/21 0700  Location: Sacrum  Location Orientation: Medial  Staging: Stage 2 -  Partial thickness loss of dermis presenting as a shallow open injury with a red, pink wound bed without slough.  Wound Description (Comments): dry  Present on Admission: Yes       Code Status:     Code Status Orders  (From admission, onward)           Start     Ordered   02/07/21 1806  Full code  Continuous        02/07/21 1809           Code Status History     Date Active Date Inactive Code Status Order ID Comments User Context   11/07/2020 2347 11/11/2020 2103 Full Code [id number]  Andris Baumann, MD ED   10/16/2017 0053 10/24/2017 1726 Full Code [id number]  Audery Amel, MD Inpatient   05/27/2017 0024 05/30/2017 1929 Full Code [id number]  Clapacs, Jackquline Denmark, MD Inpatient      Family Communication: Spoke with son on the phone Disposition Plan: Status is: Inpatient  Dispo: The patient is from: Rehab              Anticipated d/c is to: Rehab              Patient currently still catatonic with minimal response.  Dr. Toni Amend psychiatry to speak with son about ECT treatments.  The son indicated to me that he is willing to have her mom  undergo these treatments.   Difficult to place patient.  No.  Consultants: Psychiatry  Time spent: 26 minutes  Brianna [employer]

## 2021-02-28 NOTE — TOC Progression Note (Signed)
Transition of Care [facility]) - Progression Note    Patient Details  Name: Erica Mueller MRN: [id number] Date of Birth: 04-Sep-1950  Transition of [facility]) CM/SW Contact  Barrie Dunker, RN Phone Number: 02/28/2021, 2:05 PM  Clinical Narrative:    The patient remains Inpatient with the plan to return to Long Term care at [facility], She remains full code, TOC will continue to Monitor for needs   Expected Discharge Plan: Long Term Nursing Home Barriers to Discharge: Continued Medical Work up  Expected Discharge Plan and Services Expected Discharge Plan: Long Term Nursing Home In-house Referral: Clinical Social Work   Post Acute Care Choice: Nursing Home Living arrangements for the past 2 months:  Air cabin crew Health Care / Long Term Care Facility)                                       Social Determinants of Health (SDOH) Interventions    Readmission Risk Interventions No flowsheet data found.

## 2021-03-01 ENCOUNTER — Inpatient Hospital Stay: Payer: Medicare Other | Admitting: Anesthesiology

## 2021-03-01 ENCOUNTER — Encounter: Payer: Self-pay | Admitting: Family Medicine

## 2021-03-01 DIAGNOSIS — R627 Adult failure to thrive: Secondary | ICD-10-CM | POA: Diagnosis not present

## 2021-03-01 DIAGNOSIS — U071 COVID-19: Secondary | ICD-10-CM | POA: Diagnosis not present

## 2021-03-01 DIAGNOSIS — F202 Catatonic schizophrenia: Secondary | ICD-10-CM | POA: Diagnosis not present

## 2021-03-01 DIAGNOSIS — F061 Catatonic disorder due to known physiological condition: Secondary | ICD-10-CM | POA: Diagnosis not present

## 2021-03-01 LAB — GLUCOSE, CAPILLARY
Glucose-Capillary: 132 mg/dL — ABNORMAL HIGH (ref 70–99)
Glucose-Capillary: 167 mg/dL — ABNORMAL HIGH (ref 70–99)
Glucose-Capillary: 178 mg/dL — ABNORMAL HIGH (ref 70–99)
Glucose-Capillary: 208 mg/dL — ABNORMAL HIGH (ref 70–99)
Glucose-Capillary: 239 mg/dL — ABNORMAL HIGH (ref 70–99)
Glucose-Capillary: 69 mg/dL — ABNORMAL LOW (ref 70–99)

## 2021-03-01 MED ORDER — DEXTROSE-NACL 5-0.9 % IV SOLN: INTRAVENOUS | Status: DC

## 2021-03-01 MED ORDER — METHOHEXITAL SODIUM 100 MG/10ML IV SOSY
PREFILLED_SYRINGE | INTRAVENOUS | Status: DC | PRN
  Administered 2021-03-01: 50 mg via INTRAVENOUS

## 2021-03-01 MED ORDER — KETOROLAC TROMETHAMINE 30 MG/ML IJ SOLN: 30.0000 mg | Freq: Once | INTRAMUSCULAR | Status: AC

## 2021-03-01 MED ORDER — SODIUM CHLORIDE 0.9 % IV SOLN: 500.0000 mL | Freq: Once | INTRAVENOUS | Status: AC

## 2021-03-01 MED ORDER — SUCCINYLCHOLINE CHLORIDE 200 MG/10ML IV SOSY
PREFILLED_SYRINGE | INTRAVENOUS | Status: DC | PRN
  Administered 2021-03-01: 70 mg via INTRAVENOUS

## 2021-03-01 MED ORDER — KETOROLAC TROMETHAMINE 30 MG/ML IJ SOLN
INTRAMUSCULAR | Status: AC
  Administered 2021-03-01: 30 mg via INTRAVENOUS
  Filled 2021-03-01: qty 1

## 2021-03-01 MED ORDER — METHOHEXITAL SODIUM 0.5 G IJ SOLR
INTRAMUSCULAR | Status: AC
  Filled 2021-03-01: qty 500

## 2021-03-01 NOTE — Anesthesia Preprocedure Evaluation (Addendum)
Anesthesia Evaluation  Patient identified by MRN, date of birth, ID band Patient unresponsive  General Assessment Comment:Spontaneous eye opening. Pt does not speak or move extremities to command.   Reviewed: Allergy & Precautions, NPO status , Patient's Chart, lab work & pertinent test results  History of Anesthesia Complications Negative for: history of anesthetic complications  Airway       Comment: Unable to examine due to patient's mental condition Dental  (+) Upper Dentures, Lower Dentures   Pulmonary COPD, Current Smoker,  Aspiration pneumonia with sepsis on admission.  This has resolved   Pulmonary exam normal        Cardiovascular hypertension, Normal cardiovascular exam+ dysrhythmias (Previous nonsustained ventricular tachycardia secondary to electrolyte abnormalities.  On low-dose metoprolol)  Rhythm:Regular Rate:Normal  ECHO 02/2021: 1. Left ventricular ejection fraction, by estimation, is 30 to 35%. The  left ventricle has moderate to severely decreased function. The left  ventricle demonstrates global hypokinesis. Left ventricular diastolic  parameters are consistent with Grade I  diastolic dysfunction (impaired relaxation).  2. Right ventricular systolic function was not well visualized. The right  ventricular size is normal.  3. The mitral valve is degenerative. Mild mitral valve regurgitation.  4. The aortic valve was not well visualized. Aortic valve regurgitation  is mild to moderate.    Neuro/Psych Depression Catatonia Schizophrenianegative neurological ROS     GI/Hepatic Neg liver ROS, GERD  ,  Endo/Other  diabetes (Last hemoglobin A1c 7.9.), Type 2, Oral Hypoglycemic Agents, Insulin Dependent  Renal/GU negative Renal ROS     Musculoskeletal   Abdominal Gtube in place. Soft abd  Peds  Hematology negative hematology ROS (+)   Anesthesia Other Findings Opens eyes and looks around   Failure  to thrive Depression Type 2 diabetes mellitus with hyperglycemia, without long-term current use of insulin (HCC) Essential hypertension Noncompliance Schizophrenia (HCC) GERD (gastroesophageal reflux disease) History of schizophrenia Hyperlipemia Acute metabolic encephalopathy Severe sepsis (HCC) UTI (urinary tract infection) Sepsis (HCC) Pressure injury of skin Protein-calorie malnutrition, severe Failure to thrive in adult Hypernatremia Hypokalemia Catatonia AKI (acute kidney injury) (HCC) COVID-19 virus infection PEG tube malfunction (HCC) Sacral decubitus ulcer, present on admission Severe protein calorie malnutrition    Reproductive/Obstetrics                            Anesthesia Physical  Anesthesia Plan  ASA: III  Anesthesia Plan: General   Post-op Pain Management:    Induction: Intravenous  PONV Risk Score and Plan: 2 and Ondansetron and TIVA  Airway Management Planned: Mask  Additional Equipment:   Intra-op Plan:   Post-operative Plan:   Informed Consent: I have reviewed the patients History and Physical, chart, labs and discussed the procedure including the risks, benefits and alternatives for the proposed anesthesia with the patient or authorized representative who has indicated his/her understanding and acceptance.     Dental advisory given and Consent reviewed with POA  Plan Discussed with: CRNA and Surgeon  Anesthesia Plan Comments: (Discussed anesthesia with son who agrees to serial consent.)        Anesthesia Quick Evaluation

## 2021-03-01 NOTE — H&P (Signed)
Erica Mueller is an 70 y.o. female.   Chief Complaint: none HPI: catatonic unresponsive  Past Medical History:  Diagnosis Date   Coronary artery disease    Depression    Diabetes mellitus without complication (HCC)    Elevated lipids    GERD (gastroesophageal reflux disease)    Hypertension    Schizophrenia (HCC)    Syphilis (acquired)     Past Surgical History:  Procedure Laterality Date   COLONOSCOPY     COLONOSCOPY WITH PROPOFOL N/A 09/01/2015   Procedure: COLONOSCOPY WITH PROPOFOL;  Surgeon: Wallace Cullens, MD;  Location: [facility] ENDOSCOPY;  Service: Gastroenterology;  Laterality: N/A;   IR FLUORO RM 30-60 MIN  02/23/2021   IR GASTROSTOMY TUBE MOD SED  02/24/2021   IR REPLC GASTRO/COLONIC TUBE PERCUT W/FLUORO  02/27/2021   TUBAL LIGATION     wisdom teeth removal      Family History  Problem Relation Age of Onset   Cancer Mother    Alcoholism Mother    Bone cancer Father    Heart disease Father    Diabetes Maternal Grandmother    Suicidality Paternal Uncle    Breast cancer Neg Hx    Social History:  reports that she has been smoking cigarettes. She started smoking about 51 years ago. She has a 48.00 pack-year smoking history. She has never used smokeless tobacco. She reports current alcohol use of about 3.0 standard drinks per week. She reports that she does not currently use drugs after having used the following drugs: Marijuana.  Allergies: No Known Allergies  Medications Prior to Admission  Medication Sig Dispense Refill   amitriptyline (ELAVIL) 25 MG tablet Take 1 tablet (25 mg total) by mouth at bedtime. 14 tablet 0   chlorproMAZINE (THORAZINE) 25 MG tablet Take 25 mg by mouth 2 (two) times daily.     cholecalciferol (VITAMIN D3) 25 MCG (1000 UNIT) tablet Take 1,000 Units by mouth daily.     glimepiride (AMARYL) 2 MG tablet Take 2 mg by mouth daily.     lovastatin (MEVACOR) 40 MG tablet Take 1 tablet (40 mg total) by mouth at bedtime. 60 tablet 2   metFORMIN (GLUCOPHAGE)  1000 MG tablet Take 1,000 mg by mouth 2 (two) times daily.     metoprolol tartrate (LOPRESSOR) 25 MG tablet Take 1 tablet (25 mg total) by mouth 2 (two) times daily. 60 tablet 0   paliperidone (INVEGA) 6 MG 24 hr tablet Take 6 mg by mouth daily.     polyethylene glycol (MIRALAX / GLYCOLAX) 17 g packet Take 17 g by mouth daily.     senna-docusate (SENOKOT-S) 8.6-50 MG tablet Take 1 tablet by mouth at bedtime. 30 tablet 0   senna-docusate (SENOKOT-S) 8.6-50 MG tablet Take 1 tablet by mouth 2 (two) times daily. (0800 and 1600)     thiothixene (NAVANE) 2 MG capsule Take 1 capsule (2 mg total) by mouth 2 (two) times daily. 28 capsule 0   vitamin C (ASCORBIC ACID) 500 MG tablet Take 500 mg by mouth 2 (two) times daily.     zinc gluconate 50 MG tablet Take 50 mg by mouth daily.      Results for orders placed or performed during the hospital encounter of 02/07/21 (from the past 48 hour(s))  Glucose, capillary     Status: Abnormal   Collection Time: 02/27/21  3:41 PM  Result Value Ref Range   Glucose-Capillary 118 (H) 70 - 99 mg/dL    Comment: Glucose reference range  applies only to samples taken after fasting for at least 8 hours.   Comment 1 Notify RN    Comment 2 Document in Chart   Glucose, capillary     Status: Abnormal   Collection Time: 02/27/21  7:47 PM  Result Value Ref Range   Glucose-Capillary 252 (H) 70 - 99 mg/dL    Comment: Glucose reference range applies only to samples taken after fasting for at least 8 hours.  Glucose, capillary     Status: Abnormal   Collection Time: 02/27/21 11:33 PM  Result Value Ref Range   Glucose-Capillary 272 (H) 70 - 99 mg/dL    Comment: Glucose reference range applies only to samples taken after fasting for at least 8 hours.  Glucose, capillary     Status: Abnormal   Collection Time: 02/28/21  3:43 AM  Result Value Ref Range   Glucose-Capillary 185 (H) 70 - 99 mg/dL    Comment: Glucose reference range applies only to samples taken after fasting for  at least 8 hours.   Comment 1 Notify RN   Glucose, capillary     Status: None   Collection Time: 02/28/21  8:03 AM  Result Value Ref Range   Glucose-Capillary 96 70 - 99 mg/dL    Comment: Glucose reference range applies only to samples taken after fasting for at least 8 hours.  Glucose, capillary     Status: Abnormal   Collection Time: 02/28/21 12:48 PM  Result Value Ref Range   Glucose-Capillary 153 (H) 70 - 99 mg/dL    Comment: Glucose reference range applies only to samples taken after fasting for at least 8 hours.  Glucose, capillary     Status: Abnormal   Collection Time: 02/28/21  4:26 PM  Result Value Ref Range   Glucose-Capillary 162 (H) 70 - 99 mg/dL    Comment: Glucose reference range applies only to samples taken after fasting for at least 8 hours.  Glucose, capillary     Status: Abnormal   Collection Time: 02/28/21  7:49 PM  Result Value Ref Range   Glucose-Capillary 181 (H) 70 - 99 mg/dL    Comment: Glucose reference range applies only to samples taken after fasting for at least 8 hours.  Glucose, capillary     Status: Abnormal   Collection Time: 02/28/21 11:40 PM  Result Value Ref Range   Glucose-Capillary 212 (H) 70 - 99 mg/dL    Comment: Glucose reference range applies only to samples taken after fasting for at least 8 hours.  Glucose, capillary     Status: Abnormal   Collection Time: 03/01/21  4:03 AM  Result Value Ref Range   Glucose-Capillary 69 (L) 70 - 99 mg/dL    Comment: Glucose reference range applies only to samples taken after fasting for at least 8 hours.  Glucose, capillary     Status: Abnormal   Collection Time: 03/01/21  8:26 AM  Result Value Ref Range   Glucose-Capillary 132 (H) 70 - 99 mg/dL    Comment: Glucose reference range applies only to samples taken after fasting for at least 8 hours.   IR Replc Gastro/Colonic Tube Percut W/Fluoro  Result Date: 02/28/2021 INDICATION: Cracked gastrostomy tube. Please perform fluoroscopic guided exchange.  EXAM: FLUOROSCOPIC GUIDED REPLACEMENT OF GASTROSTOMY TUBE COMPARISON:  Image guided percutaneous gastrostomy tube placement-02/24/2021 MEDICATIONS: None. CONTRAST:  16mL OMNIPAQUE IOHEXOL 300 MG/ML SOLN - administered into the gastric lumen FLUOROSCOPY TIME:  24 seconds (1.1 mGy) COMPLICATIONS: None immediate. PROCEDURE: A timeout was performed prior  to the initiation of the procedure. On physical inspection, the external portion of the gastrostomy tube was indeed split causing leakage during attempted percutaneous feedings. As such, the decision was made to proceed with fluoroscopic guided exchange. The existing gastrostomy tube was injected with a small amount contrast demonstrated appropriate position functionality The external portion of the gastrostomy tube was cut and cannulated with a short Amplatz wire which was coiled within the gastric lumen. Under intermittent fluoroscopic guidance, the existing gastrostomy tube was exchanged for a new 18 French balloon retention gastrostomy tube. The retention balloon was inflated with 10 cc of saline and a minimal amount of dilute contrast, pulled against the inner wall of the gastric lumen and the external disc was cinched. Postprocedural injection demonstrates appropriate position functionality of the gastrostomy tube. A dressing was applied. The patient tolerated the procedure well without immediate postprocedural complication. IMPRESSION: Successful fluoroscopic guided replacement of a new 18-French gastrostomy tube. The gastrostomy tube is ready for immediate use. Electronically Signed   By: Simonne Come M.D.   On: 02/28/2021 08:15    Review of Systems  Unable to perform ROS: Psychiatric disorder  Skin: Negative.    Blood pressure (!) 124/52, pulse 92, temperature 97.8 F (36.6 C), temperature source Oral, resp. rate 18, height 5\' 2"  (1.575 m), weight 45.1 kg, SpO2 98 %. Physical Exam Vitals reviewed.  Constitutional:      Appearance: She is  well-developed. She is ill-appearing.  HENT:     Head: Normocephalic and atraumatic.  Eyes:     Conjunctiva/sclera: Conjunctivae normal.     Pupils: Pupils are equal, round, and reactive to light.  Cardiovascular:     Heart sounds: Normal heart sounds.  Pulmonary:     Effort: Pulmonary effort is normal.  Abdominal:     Palpations: Abdomen is soft.  Musculoskeletal:        General: Normal range of motion.     Cervical back: Normal range of motion.  Skin:    General: Skin is warm and dry.  Neurological:     Mental Status: She is disoriented.  Psychiatric:        Attention and Perception: She is inattentive.        Mood and Affect: Affect is blunt.        Speech: She is noncommunicative.     Assessment/Plan Begin bilat ect  , MD 03/01/2021, 12:21 PM

## 2021-03-01 NOTE — Progress Notes (Signed)
Pt aldrete score is 6 due to being in a catatonic state and  no movement and unresponsive to voice.  Per Dr. Delaney Meigs, this is her baseline. VSS.  NAD.

## 2021-03-01 NOTE — Transfer of Care (Signed)
Immediate Anesthesia Transfer of Care Note  Patient: Erica Mueller  Procedure(s) Performed: ECT TX  Patient Location: PACU  Anesthesia Type:General  Level of Consciousness: patient cooperative  Airway & Oxygen Therapy: Patient Spontanous Breathing  Post-op Assessment: Report given to RN and Post -op Vital signs reviewed and stable  Post vital signs: Reviewed and stable  Last Vitals:  Vitals Value Taken Time  BP 110/52 03/01/21 1302  Temp 36.4 C 03/01/21 1302  Pulse    Resp 24 03/01/21 1302  SpO2 100 % 03/01/21 1302    Last Pain:  Vitals:   03/01/21 1140  TempSrc: Oral  PainSc: 0-No pain         Complications: No notable events documented.

## 2021-03-01 NOTE — Progress Notes (Signed)
PROGRESS NOTE    Erica Mueller  DTO:[id number] DOB: [birth year] DOA: 02/07/2021 PCP: [name]    Brief Narrative:   Patient admitted 02/07/2021 with severe sepsis COVID-19 infection.  Past medical history of schizophrenia, hypertension, GERD, type 2 diabetes mellitus and depression and CAD.  The patient had a prolonged hospital course and never regained mental status.  She was treated for electrolyte abnormalities including hypernatremia, hypokalemia and hypophosphatemia.  She had an NG tube for 2 weeks and then a PEG tube was placed on 02/24/2021.  There was a PEG tube malfunction 9/19. IR was able to change over the guidewire.  Seen by psychiatry and restarted on psychiatric medications but still catatonic.  Psychiatry started ECT treatment.   Assessment & Plan:   Principal Problem:   Catatonia Active Problems:   Type 2 diabetes mellitus with hyperglycemia, without long-term current use of insulin (HCC)   Essential hypertension   Schizophrenia (HCC)   Sepsis (HCC)   Pressure injury of skin   Protein-calorie malnutrition, severe   Failure to thrive in adult   Hypernatremia   Hypokalemia   AKI (acute kidney injury) (HCC)   COVID-19 virus infection   PEG tube malfunction (HCC)  #1.  Schizophrenia with catatonia. Patient has been treated by psychiatry, ECT was started today.  Patient still is nonverbal.  Also started on thiothixene and valium. Has not been able to eat, currently receiving tube feeding.  Discontinue IV fluids.  #2.  COVID infection. Severe sepsis secondary to aspiration pneumonia. Aspiration pneumonia. Condition has improved, currently patient off oxygen.  #3.  Failure to thrive. Severe protein calorie malnutrition. Continue tube feeding.   #4.  Hypernatremia. Hypokalemia Hypophosphatemia. Condition has improved, I will recheck levels tomorrow.  5.  Essential hypertension. Continue metoprolol.  6.  Type 2 diabetes. Has relative hypoglycemia,  will discontinue long-acting insulin.    DVT prophylaxis: Lovenox Code Status: full Family Communication:  Disposition Plan:    Status is: Inpatient  Remains inpatient appropriate because:Altered mental status and Inpatient level of care appropriate due to severity of illness  Dispo: The patient is from: Home              Anticipated d/c is to: Home              Patient currently is not medically stable to d/c.   Difficult to place patient No        I/O last 3 completed shifts: In: -  Out: 1100 [Urine:1100] Total I/O In: 100 [I.V.:100] Out: -      Consultants:  Psych  Procedures: ECT  Antimicrobials:None  Subjective: Patient is awake, but not responding to verbal commands. She has no short of breath or hypoxia. No fever or chills. No abdominal pain or nausea vomiting.   Objective: Vitals:   03/01/21 1302 03/01/21 1317 03/01/21 1324 03/01/21 1441  BP: (!) 110/52 (!) 125/51 (!) 132/55 (!) 137/50  Pulse:    86  Resp: (!) 24 (!) 25 (!) 24 20  Temp: (!) 97.5 F (36.4 C)  97.8 F (36.6 C) 97.9 F (36.6 C)  TempSrc:    Oral  SpO2: 100% 98% 98% 99%  Weight:      Height:        Intake/Output Summary (Last 24 hours) at 03/01/2021 1633 Last data filed at 03/01/2021 1303 Gross per 24 hour  Intake 100 ml  Output 550 ml  Net -450 ml   Filed Weights   02/26/21 0600 02/27/21 0500 03/01/21  1140  Weight: 48.2 kg 45.1 kg 45.1 kg    Examination:  General exam: Appears calm and comfortable  Respiratory system: Clear to auscultation. Respiratory effort normal. Cardiovascular system: S1 & S2 heard, RRR. No JVD, murmurs, rubs, gallops or clicks. No pedal edema. Gastrointestinal system: Abdomen is nondistended, soft and nontender. No organomegaly or masses felt. Normal bowel sounds heard. Central nervous system: Alert and nonverbal. No focal neurological deficits. Extremities: Symmetric 5 x 5 power. Skin: No rashes, lesions or ulcers     Data Reviewed: I  have personally reviewed following labs and imaging studies  CBC: Recent Labs  Lab 02/24/21 0534  WBC 3.8*  HGB 12.2  HCT 37.4  MCV 95.7  PLT 223   Basic Metabolic Panel: Recent Labs  Lab 02/24/21 0534 02/27/21 0631  NA 136 133*  K 4.2 4.6  CL 103 102  CO2 25 25  GLUCOSE 85 260*  BUN 17 18  CREATININE 0.45 0.54  CALCIUM 9.2 8.9  MG  --  1.9  PHOS  --  3.9   GFR: Estimated Creatinine Clearance: 46.6 mL/min (by C-G formula based on SCr of 0.54 mg/dL). Liver Function Tests: No results for input(s): AST, ALT, ALKPHOS, BILITOT, PROT, ALBUMIN in the last 168 hours. No results for input(s): LIPASE, AMYLASE in the last 168 hours. No results for input(s): AMMONIA in the last 168 hours. Coagulation Profile: Recent Labs  Lab 02/23/21 0400  INR 1.0   Cardiac Enzymes: No results for input(s): CKTOTAL, CKMB, CKMBINDEX, TROPONINI in the last 168 hours. BNP (last 3 results) No results for input(s): PROBNP in the last 8760 hours. HbA1C: No results for input(s): HGBA1C in the last 72 hours. CBG: Recent Labs  Lab 02/28/21 1949 02/28/21 2340 03/01/21 0403 03/01/21 0826 03/01/21 1437  GLUCAP 181* 212* 69* 132* 208*   Lipid Profile: No results for input(s): CHOL, HDL, LDLCALC, TRIG, CHOLHDL, LDLDIRECT in the last 72 hours. Thyroid Function Tests: No results for input(s): TSH, T4TOTAL, FREET4, T3FREE, THYROIDAB in the last 72 hours. Anemia Panel: No results for input(s): VITAMINB12, FOLATE, FERRITIN, TIBC, IRON, RETICCTPCT in the last 72 hours. Sepsis Labs: No results for input(s): PROCALCITON, LATICACIDVEN in the last 168 hours.  No results found for this or any previous visit (from the past 240 hour(s)).       Radiology Studies: No results found.      Scheduled Meds:  vitamin C  500 mg Per Tube Daily   chlorhexidine  15 mL Mouth Rinse BID   Chlorhexidine Gluconate Cloth  6 each Topical Daily   enoxaparin (LOVENOX) injection  40 mg Subcutaneous Q24H   free  water  30 mL Per Tube Q4H   insulin aspart  0-9 Units Subcutaneous Q4H   insulin glargine-yfgn  4 Units Subcutaneous QHS   mouth rinse  15 mL Mouth Rinse q12n4p   metoprolol tartrate  12.5 mg Per Tube BID   thiothixene  2 mg Per Tube BID   Continuous Infusions:  sodium chloride     dextrose 5 % and 0.9% NaCl 75 mL/hr at 03/01/21 1236   feeding supplement (OSMOLITE 1.2 CAL) Stopped (02/28/21 2343)     LOS: 22 days    Time spent: 26 minutes    Marrion Coy, MD [facility]   To contact the attending provider between 7A-7P or the covering provider during after hours 7P-7A, please log into the web site [email or web address] and access using universal [place] password for that web site. If you do not  have the password, please call the hospital operator.  03/01/2021, 4:33 PM

## 2021-03-01 NOTE — Consult Note (Signed)
[facility] Face-to-Face Psychiatry Consult   Reason for Consult: Follow-up patient with schizophrenia currently having features of catatonia Referring Physician: Chipper Herb Patient Identification: Erica Mueller MRN:  [id number] Principal Diagnosis: Catatonia Diagnosis:  Principal Problem:   Catatonia Active Problems:   Type 2 diabetes mellitus with hyperglycemia, without long-term current use of insulin (HCC)   Essential hypertension   Schizophrenia (HCC)   Sepsis (HCC)   Pressure injury of skin   Protein-calorie malnutrition, severe   Failure to thrive in adult   Hypernatremia   Hypokalemia   AKI (acute kidney injury) (HCC)   COVID-19 virus infection   PEG tube malfunction (HCC)   Total Time spent with patient: 30 minutes  Subjective:   Erica Mueller is a 70 y.o. female patient admitted with patient not verbal.  HPI: Continues to be catatonic after recovery from COVID.  Spoke with son today who gave verbal consent as documented for ECT treatment  Past Psychiatric History: History of schizophrenia  Risk to Self:   Risk to Others:   Prior Inpatient Therapy:   Prior Outpatient Therapy:    Past Medical History:  Past Medical History:  Diagnosis Date   Coronary artery disease    Depression    Diabetes mellitus without complication (HCC)    Elevated lipids    GERD (gastroesophageal reflux disease)    Hypertension    Schizophrenia (HCC)    Syphilis (acquired)     Past Surgical History:  Procedure Laterality Date   COLONOSCOPY     COLONOSCOPY WITH PROPOFOL N/A 09/01/2015   Procedure: COLONOSCOPY WITH PROPOFOL;  Surgeon: Wallace Cullens, MD;  Location: [facility] ENDOSCOPY;  Service: Gastroenterology;  Laterality: N/A;   IR FLUORO RM 30-60 MIN  02/23/2021   IR GASTROSTOMY TUBE MOD SED  02/24/2021   IR REPLC GASTRO/COLONIC TUBE PERCUT W/FLUORO  02/27/2021   TUBAL LIGATION     wisdom teeth removal     Family History:  Family History  Problem Relation Age of Onset   Cancer Mother     Alcoholism Mother    Bone cancer Father    Heart disease Father    Diabetes Maternal Grandmother    Suicidality Paternal Uncle    Breast cancer Neg Hx    Family Psychiatric  History: Positive for mental illness Social History:  Social History   Substance and Sexual Activity  Alcohol Use Yes   Alcohol/week: 3.0 standard drinks   Types: 2 Cans of beer, 1 Shots of liquor per week     Social History   Substance and Sexual Activity  Drug Use Not Currently   Types: Marijuana   Comment: PAST     Social History   Socioeconomic History   Marital status: Divorced    Spouse name: Not on file   Number of children: Not on file   Years of education: Not on file   Highest education level: Not on file  Occupational History   Not on file  Tobacco Use   Smoking status: Every Day    Packs/day: 1.00    Years: 48.00    Pack years: 48.00    Types: Cigarettes    Start date: 12/20/1969   Smokeless tobacco: Never  Vaping Use   Vaping Use: Never used  Substance and Sexual Activity   Alcohol use: Yes    Alcohol/week: 3.0 standard drinks    Types: 2 Cans of beer, 1 Shots of liquor per week   Drug use: Not Currently    Types: Marijuana  Comment: PAST    Sexual activity: Not Currently    Birth control/protection: None  Other Topics Concern   Not on file  Social History Narrative   Not on file   Social Determinants of Health   Financial Resource Strain: Not on file  Food Insecurity: Not on file  Transportation Needs: Not on file  Physical Activity: Not on file  Stress: Not on file  Social Connections: Not on file   Additional Social History:    Allergies:  No Known Allergies  Labs:  Results for orders placed or performed during the hospital encounter of 02/07/21 (from the past 48 hour(s))  Glucose, capillary     Status: Abnormal   Collection Time: 02/27/21  7:47 PM  Result Value Ref Range   Glucose-Capillary 252 (H) 70 - 99 mg/dL    Comment: Glucose reference range  applies only to samples taken after fasting for at least 8 hours.  Glucose, capillary     Status: Abnormal   Collection Time: 02/27/21 11:33 PM  Result Value Ref Range   Glucose-Capillary 272 (H) 70 - 99 mg/dL    Comment: Glucose reference range applies only to samples taken after fasting for at least 8 hours.  Glucose, capillary     Status: Abnormal   Collection Time: 02/28/21  3:43 AM  Result Value Ref Range   Glucose-Capillary 185 (H) 70 - 99 mg/dL    Comment: Glucose reference range applies only to samples taken after fasting for at least 8 hours.   Comment 1 Notify RN   Glucose, capillary     Status: None   Collection Time: 02/28/21  8:03 AM  Result Value Ref Range   Glucose-Capillary 96 70 - 99 mg/dL    Comment: Glucose reference range applies only to samples taken after fasting for at least 8 hours.  Glucose, capillary     Status: Abnormal   Collection Time: 02/28/21 12:48 PM  Result Value Ref Range   Glucose-Capillary 153 (H) 70 - 99 mg/dL    Comment: Glucose reference range applies only to samples taken after fasting for at least 8 hours.  Glucose, capillary     Status: Abnormal   Collection Time: 02/28/21  4:26 PM  Result Value Ref Range   Glucose-Capillary 162 (H) 70 - 99 mg/dL    Comment: Glucose reference range applies only to samples taken after fasting for at least 8 hours.  Glucose, capillary     Status: Abnormal   Collection Time: 02/28/21  7:49 PM  Result Value Ref Range   Glucose-Capillary 181 (H) 70 - 99 mg/dL    Comment: Glucose reference range applies only to samples taken after fasting for at least 8 hours.  Glucose, capillary     Status: Abnormal   Collection Time: 02/28/21 11:40 PM  Result Value Ref Range   Glucose-Capillary 212 (H) 70 - 99 mg/dL    Comment: Glucose reference range applies only to samples taken after fasting for at least 8 hours.  Glucose, capillary     Status: Abnormal   Collection Time: 03/01/21  4:03 AM  Result Value Ref Range    Glucose-Capillary 69 (L) 70 - 99 mg/dL    Comment: Glucose reference range applies only to samples taken after fasting for at least 8 hours.  Glucose, capillary     Status: Abnormal   Collection Time: 03/01/21  8:26 AM  Result Value Ref Range   Glucose-Capillary 132 (H) 70 - 99 mg/dL    Comment: Glucose  reference range applies only to samples taken after fasting for at least 8 hours.  Glucose, capillary     Status: Abnormal   Collection Time: 03/01/21  2:37 PM  Result Value Ref Range   Glucose-Capillary 208 (H) 70 - 99 mg/dL    Comment: Glucose reference range applies only to samples taken after fasting for at least 8 hours.  Glucose, capillary     Status: Abnormal   Collection Time: 03/01/21  4:40 PM  Result Value Ref Range   Glucose-Capillary 239 (H) 70 - 99 mg/dL    Comment: Glucose reference range applies only to samples taken after fasting for at least 8 hours.    Current Facility-Administered Medications  Medication Dose Route Frequency Provider Last Rate Last Admin   0.9 %  sodium chloride infusion  500 mL Intravenous Once Shealynn Saulnier, Jackquline Denmark, MD       acetaminophen (TYLENOL) suppository 650 mg  650 mg Rectal Q4H PRN Jimmye Norman, NP       acetaminophen (TYLENOL) tablet 650 mg  650 mg Oral Q4H PRN Harlon Ditty D, NP       ascorbic acid (VITAMIN C) tablet 500 mg  500 mg Per Tube Daily Charise Killian, MD   500 mg at 03/01/21 1016   chlorhexidine (PERIDEX) 0.12 % solution 15 mL  15 mL Mouth Rinse BID Alberteen Sam, MD   15 mL at 03/01/21 1015   Chlorhexidine Gluconate Cloth 2 % PADS 6 each  6 each Topical Daily Erin Fulling, MD   6 each at 03/01/21 1023   docusate sodium (COLACE) capsule 100 mg  100 mg Oral BID PRN Judithe Modest, NP       enoxaparin (LOVENOX) injection 40 mg  40 mg Subcutaneous Q24H Bari Mantis A, RPH   40 mg at 02/28/21 1956   feeding supplement (OSMOLITE 1.2 CAL) liquid 1,000 mL  1,000 mL Per Tube Continuous Alford Highland, MD    Stopped at 02/28/21 2343   free water 30 mL  30 mL Per Tube Q4H Alford Highland, MD   30 mL at 03/01/21 1712   insulin aspart (novoLOG) injection 0-9 Units  0-9 Units Subcutaneous Q4H Alford Highland, MD   3 Units at 03/01/21 1712   MEDLINE mouth rinse  15 mL Mouth Rinse q12n4p Alberteen Sam, MD   15 mL at 03/01/21 1712   metoprolol tartrate (LOPRESSOR) 25 mg/10 mL oral suspension 12.5 mg  12.5 mg Per Tube BID Alford Highland, MD   12.5 mg at 03/01/21 1016   morphine 2 MG/ML injection 2 mg  2 mg Intravenous Q4H PRN Jimmye Norman, NP       ondansetron [facility]) injection 4 mg  4 mg Intravenous Q6H PRN Judithe Modest, NP       polyethylene glycol (MIRALAX / GLYCOLAX) packet 17 g  17 g Oral Daily PRN Judithe Modest, NP       thiothixene (NAVANE) capsule 2 mg  2 mg Per Tube BID Janssen Zee, Jackquline Denmark, MD   2 mg at 03/01/21 1016    Musculoskeletal: Strength & Muscle Tone: decreased Gait & Station: unable to stand Patient leans: N/A            Psychiatric Specialty Exam:  Presentation  General Appearance:  No data recorded Eye Contact: No data recorded Speech: No data recorded Speech Volume: No data recorded Handedness: No data recorded  Mood and Affect  Mood: No data recorded Affect: No data recorded  Thought Process  Thought Processes: No data recorded Descriptions of Associations:No data recorded Orientation:No data recorded Thought Content:No data recorded History of Schizophrenia/Schizoaffective disorder:No data recorded Duration of Psychotic Symptoms:No data recorded Hallucinations:No data recorded Ideas of Reference:No data recorded Suicidal Thoughts:No data recorded Homicidal Thoughts:No data recorded  Sensorium  Memory: No data recorded Judgment: No data recorded Insight: No data recorded  Executive Functions  Concentration: No data recorded Attention Span: No data recorded Recall: No data recorded Fund of Knowledge: No  data recorded Language: No data recorded  Psychomotor Activity  Psychomotor Activity: No data recorded  Assets  Assets: No data recorded  Sleep  Sleep: No data recorded  Physical Exam: Physical Exam Vitals and nursing note reviewed.  Constitutional:      Appearance: She is ill-appearing.  HENT:     Head: Normocephalic and atraumatic.     Mouth/Throat:     Pharynx: Oropharynx is clear.  Eyes:     Pupils: Pupils are equal, round, and reactive to light.  Cardiovascular:     Rate and Rhythm: Normal rate and regular rhythm.  Pulmonary:     Effort: Pulmonary effort is normal.     Breath sounds: Normal breath sounds.  Abdominal:     General: Abdomen is flat.     Palpations: Abdomen is soft.  Musculoskeletal:        General: Normal range of motion.  Skin:    General: Skin is warm and dry.  Neurological:     General: No focal deficit present.  Psychiatric:        Attention and Perception: She is inattentive.        Speech: She is noncommunicative.   Review of Systems  Unable to perform ROS: Psychiatric disorder  Blood pressure (!) 125/53, pulse 92, temperature (!) 97.5 F (36.4 C), resp. rate 16, height 5\' 2"  (1.575 m), weight 45.1 kg, SpO2 100 %. Body mass index is 18.19 kg/m.  Treatment Plan Summary: Plan bilateral ECT treatments starting today continuing 3 times a week schedule  Disposition:  See plan  , MD 03/01/2021 5:25 PM

## 2021-03-01 NOTE — Plan of Care (Signed)
  Problem: Education: Goal: Knowledge of General Education information will improve Description: Including pain rating scale, medication(s)/side effects and non-pharmacologic comfort measures Outcome: Not Progressing   Problem: Elimination: Goal: Will not experience complications related to bowel motility Outcome: Not Progressing Goal: Will not experience complications related to urinary retention Outcome: Not Progressing   Problem: Respiratory: Goal: Will maintain a patent airway Outcome: Not Progressing Goal: Complications related to the disease process, condition or treatment will be avoided or minimized Outcome: Not Progressing   Problem: Skin Integrity: Goal: Risk for impaired skin integrity will decrease Outcome: Not Progressing   Problem: Safety: Goal: Ability to remain free from injury will improve Outcome: Not Progressing

## 2021-03-01 NOTE — Procedures (Signed)
ECT SERVICES Physician's Interval Evaluation & Treatment Note  Patient Identification: Erica Mueller MRN:  [id number] Date of Evaluation:  03/01/2021 TX #: 1  MADRS:   MMSE:   P.E. Findings:  Patient is catatonic very withdrawn with contractures.  Lungs and heart appear to be clear and normal  Psychiatric Interval Note:  Catatonic unresponsive  Subjective:  Patient is a 70 y.o. female seen for evaluation for Electroconvulsive Therapy. Catatonic unresponsive  Treatment Summary:   []   Right Unilateral             [x]  Bilateral   % Energy : 1.0 ms 40%   Impedance: 2510 ohms  Seizure Energy Index: No reading  Postictal Suppression Index: No reading  Seizure Concordance Index: No reading  Medications  Pre Shock: 50 mg Brevital 70 mg succinylcholine 30 mg Toradol  Post Shock:    Seizure Duration: 22 seconds EMG 34 seconds EEG   Comments: Patient will continue index course at this point in an attempt to improve catatonia  Lungs:  [x]   Clear to auscultation               []  Other:   Heart:    [x]   Regular rhythm             []  irregular rhythm    [x]   Previous H&P reviewed, patient examined and there are NO CHANGES                 []   Previous H&P reviewed, patient examined and there are changes noted.   , MD 9/21/20225:21 PM

## 2021-03-02 DIAGNOSIS — U071 COVID-19: Secondary | ICD-10-CM | POA: Diagnosis not present

## 2021-03-02 DIAGNOSIS — F061 Catatonic disorder due to known physiological condition: Secondary | ICD-10-CM | POA: Diagnosis not present

## 2021-03-02 DIAGNOSIS — R627 Adult failure to thrive: Secondary | ICD-10-CM | POA: Diagnosis not present

## 2021-03-02 LAB — BASIC METABOLIC PANEL
Anion gap: 7 (ref 5–15)
BUN: 18 mg/dL (ref 8–23)
CO2: 24 mmol/L (ref 22–32)
Calcium: 8.8 mg/dL — ABNORMAL LOW (ref 8.9–10.3)
Chloride: 102 mmol/L (ref 98–111)
Creatinine, Ser: 0.41 mg/dL — ABNORMAL LOW (ref 0.44–1.00)
GFR, Estimated: >60 mL/min (ref >60)
Glucose, Bld: 231 mg/dL — ABNORMAL HIGH (ref 70–99)
Potassium: 4.1 mmol/L (ref 3.5–5.1)
Sodium: 133 mmol/L — ABNORMAL LOW (ref 135–145)

## 2021-03-02 LAB — GLUCOSE, CAPILLARY
Glucose-Capillary: 260 mg/dL — ABNORMAL HIGH (ref 70–99)
Glucose-Capillary: 101 mg/dL — ABNORMAL HIGH (ref 70–99)
Glucose-Capillary: 239 mg/dL — ABNORMAL HIGH (ref 70–99)
Glucose-Capillary: 148 mg/dL — ABNORMAL HIGH (ref 70–99)
Glucose-Capillary: 128 mg/dL — ABNORMAL HIGH (ref 70–99)
Glucose-Capillary: 153 mg/dL — ABNORMAL HIGH (ref 70–99)

## 2021-03-02 LAB — PHOSPHORUS: Phosphorus: 3.4 mg/dL (ref 2.5–4.6)

## 2021-03-02 LAB — MAGNESIUM: Magnesium: 1.8 mg/dL (ref 1.7–2.4)

## 2021-03-02 NOTE — Progress Notes (Signed)
PROGRESS NOTE    Erica Mueller  QZR:[id number] DOB: 05-21-1951 DOA: 02/07/2021 PCP: [name]    Brief Narrative:  Patient admitted 02/07/2021 with severe sepsis COVID-19 infection.  Past medical history of schizophrenia, hypertension, GERD, type 2 diabetes mellitus and depression and CAD.  The patient had a prolonged hospital course and never regained mental status.  She was treated for electrolyte abnormalities including hypernatremia, hypokalemia and hypophosphatemia.  She had an NG tube for 2 weeks and then a PEG tube was placed on 02/24/2021.  There was a PEG tube malfunction 9/19. IR was able to change over the guidewire.  Seen by psychiatry and restarted on psychiatric medications but still catatonic.  Psychiatry started ECT treatment.   Assessment & Plan:   Principal Problem:   Catatonia Active Problems:   Type 2 diabetes mellitus with hyperglycemia, without long-term current use of insulin (HCC)   Essential hypertension   Schizophrenia (HCC)   Sepsis (HCC)   Pressure injury of skin   Protein-calorie malnutrition, severe   Failure to thrive in adult   Hypernatremia   Hypokalemia   AKI (acute kidney injury) (HCC)   COVID-19 virus infection   PEG tube malfunction (HCC)   #1.  Schizophrenia with catatonia. Patient is still nonverbal, continue ECT treatment per psychiatry. Continue tube feeding.   #2.  COVID infection. Severe sepsis secondary to aspiration pneumonia. Aspiration pneumonia. Condition improved.  #3.  Failure to thrive. Severe protein calorie malnutrition. Tolerating tube feeding.    #4.  Hypernatremia. Hypokalemia Hypophosphatemia. Conditions resolved.   DVT prophylaxis: Lovenox Code Status: full Family Communication:  Disposition Plan:      Status is: Inpatient   Remains inpatient appropriate because:Altered mental status and Inpatient level of care appropriate due to severity of illness   Dispo: The patient is from: Home               Anticipated d/c is to: Home              Patient currently is not medically stable to d/c.              Difficult to place patient No       I/O last 3 completed shifts: In: 100 [I.V.:100] Out: 550 [Urine:550] No intake/output data recorded.     Consultants:  Psych  Procedures: ECT  Antimicrobials: None   Subjective: Nonverbal, tolerating for tube feeding.  No nausea vomiting abdominal pain. No fever or chills. No dysuria hematuria  No short of breath or cough  Objective: Vitals:   03/01/21 2322 03/02/21 0531 03/02/21 0718 03/02/21 1140  BP: (!) 132/50 (!) 128/59 (!) 130/51 (!) 133/54  Pulse: 80 92 78 87  Resp: 20 18 14 15   Temp: 98 F (36.7 C) 98 F (36.7 C) 97.6 F (36.4 C) 97.8 F (36.6 C)  TempSrc:  Oral    SpO2: 100% 99% 97% 98%  Weight:      Height:        Intake/Output Summary (Last 24 hours) at 03/02/2021 1208 Last data filed at 03/01/2021 1303 Gross per 24 hour  Intake 100 ml  Output --  Net 100 ml   Filed Weights   02/26/21 0600 02/27/21 0500 03/01/21 1140  Weight: 48.2 kg 45.1 kg 45.1 kg    Examination:  General exam: Appears calm and comfortable  Respiratory system: Clear to auscultation. Respiratory effort normal. Cardiovascular system: S1 & S2 heard, RRR. No JVD, murmurs, rubs, gallops or clicks. No pedal edema. Gastrointestinal  system: Abdomen is nondistended, soft and nontender. No organomegaly or masses felt. Normal bowel sounds heard. Central nervous system: Alert and nonverbal. No focal neurological deficits. Extremities: Symmetric 5 x 5 power. Skin: No rashes, lesions or ulcers     Data Reviewed: I have personally reviewed following labs and imaging studies  CBC: Recent Labs  Lab 02/24/21 0534  WBC 3.8*  HGB 12.2  HCT 37.4  MCV 95.7  PLT 223   Basic Metabolic Panel: Recent Labs  Lab 02/24/21 0534 02/27/21 0631 03/02/21 0447  NA 136 133* 133*  K 4.2 4.6 4.1  CL 103 102 102  CO2 25 25 24   GLUCOSE 85 260*  231*  BUN 17 18 18   CREATININE 0.45 0.54 0.41*  CALCIUM 9.2 8.9 8.8*  MG  --  1.9 1.8  PHOS  --  3.9 3.4   GFR: Estimated Creatinine Clearance: 46.6 mL/min (A) (by C-G formula based on SCr of 0.41 mg/dL (L)). Liver Function Tests: No results for input(s): AST, ALT, ALKPHOS, BILITOT, PROT, ALBUMIN in the last 168 hours. No results for input(s): LIPASE, AMYLASE in the last 168 hours. No results for input(s): AMMONIA in the last 168 hours. Coagulation Profile: No results for input(s): INR, PROTIME in the last 168 hours. Cardiac Enzymes: No results for input(s): CKTOTAL, CKMB, CKMBINDEX, TROPONINI in the last 168 hours. BNP (last 3 results) No results for input(s): PROBNP in the last 8760 hours. HbA1C: No results for input(s): HGBA1C in the last 72 hours. CBG: Recent Labs  Lab 03/01/21 1948 03/01/21 2344 03/02/21 0410 03/02/21 0719 03/02/21 1138  GLUCAP 167* 178* 260* 101* 239*   Lipid Profile: No results for input(s): CHOL, HDL, LDLCALC, TRIG, CHOLHDL, LDLDIRECT in the last 72 hours. Thyroid Function Tests: No results for input(s): TSH, T4TOTAL, FREET4, T3FREE, THYROIDAB in the last 72 hours. Anemia Panel: No results for input(s): VITAMINB12, FOLATE, FERRITIN, TIBC, IRON, RETICCTPCT in the last 72 hours. Sepsis Labs: No results for input(s): PROCALCITON, LATICACIDVEN in the last 168 hours.  No results found for this or any previous visit (from the past 240 hour(s)).       Radiology Studies: No results found.      Scheduled Meds:  vitamin C  500 mg Per Tube Daily   chlorhexidine  15 mL Mouth Rinse BID   Chlorhexidine Gluconate Cloth  6 each Topical Daily   enoxaparin (LOVENOX) injection  40 mg Subcutaneous Q24H   free water  30 mL Per Tube Q4H   insulin aspart  0-9 Units Subcutaneous Q4H   mouth rinse  15 mL Mouth Rinse q12n4p   metoprolol tartrate  12.5 mg Per Tube BID   thiothixene  2 mg Per Tube BID   Continuous Infusions:  sodium chloride     feeding  supplement (OSMOLITE 1.2 CAL) 1,000 mL (03/01/21 2231)     LOS: 23 days    Time spent: 27 minutes    03/04/21, MD [facility]   To contact the attending provider between 7A-7P or the covering provider during after hours 7P-7A, please log into the web site [email or web address] and access using universal [place] password for that web site. If you do not have the password, please call the hospital operator.  03/02/2021, 12:08 PM

## 2021-03-02 NOTE — Consult Note (Signed)
[facility] Face-to-Face Psychiatry Consult   Reason for Consult: Follow-up for patient with a history of schizophrenia currently unresponsive catatonic on the medical service Referring Physician: Chipper Herb Patient Identification: Erica Mueller MRN:  [id number] Principal Diagnosis: Catatonia Diagnosis:  Principal Problem:   Catatonia Active Problems:   Type 2 diabetes mellitus with hyperglycemia, without long-term current use of insulin (HCC)   Essential hypertension   Schizophrenia (HCC)   Sepsis (HCC)   Pressure injury of skin   Protein-calorie malnutrition, severe   Failure to thrive in adult   Hypernatremia   Hypokalemia   AKI (acute kidney injury) (HCC)   COVID-19 virus infection   PEG tube malfunction (HCC)   Total Time spent with patient: 20 minutes  Subjective:   LISA-MARIE RUEGER is a 70 y.o. female patient admitted with patient not verbal.  HPI: First ECT treatment on Monday.  This evening patient looked more awake.  Made eye contact.  Still not speaking.  Feeding tube currently out.  Moving a little bit more.  Just generally looks more alert  Past Psychiatric History: History of schizophrenia  Risk to Self:   Risk to Others:   Prior Inpatient Therapy:   Prior Outpatient Therapy:    Past Medical History:  Past Medical History:  Diagnosis Date   Coronary artery disease    Depression    Diabetes mellitus without complication (HCC)    Elevated lipids    GERD (gastroesophageal reflux disease)    Hypertension    Schizophrenia (HCC)    Syphilis (acquired)     Past Surgical History:  Procedure Laterality Date   COLONOSCOPY     COLONOSCOPY WITH PROPOFOL N/A 09/01/2015   Procedure: COLONOSCOPY WITH PROPOFOL;  Surgeon: Wallace Cullens, MD;  Location: [facility] ENDOSCOPY;  Service: Gastroenterology;  Laterality: N/A;   IR FLUORO RM 30-60 MIN  02/23/2021   IR GASTROSTOMY TUBE MOD SED  02/24/2021   IR REPLC GASTRO/COLONIC TUBE PERCUT W/FLUORO  02/27/2021   TUBAL LIGATION     wisdom teeth  removal     Family History:  Family History  Problem Relation Age of Onset   Cancer Mother    Alcoholism Mother    Bone cancer Father    Heart disease Father    Diabetes Maternal Grandmother    Suicidality Paternal Uncle    Breast cancer Neg Hx    Family Psychiatric  History: See previous.  History of suicidality in the family Social History:  Social History   Substance and Sexual Activity  Alcohol Use Yes   Alcohol/week: 3.0 standard drinks   Types: 2 Cans of beer, 1 Shots of liquor per week     Social History   Substance and Sexual Activity  Drug Use Not Currently   Types: Marijuana   Comment: PAST     Social History   Socioeconomic History   Marital status: Divorced    Spouse name: Not on file   Number of children: Not on file   Years of education: Not on file   Highest education level: Not on file  Occupational History   Not on file  Tobacco Use   Smoking status: Every Day    Packs/day: 1.00    Years: 48.00    Pack years: 48.00    Types: Cigarettes    Start date: 12/20/1969   Smokeless tobacco: Never  Vaping Use   Vaping Use: Never used  Substance and Sexual Activity   Alcohol use: Yes    Alcohol/week: 3.0 standard  drinks    Types: 2 Cans of beer, 1 Shots of liquor per week   Drug use: Not Currently    Types: Marijuana    Comment: PAST    Sexual activity: Not Currently    Birth control/protection: None  Other Topics Concern   Not on file  Social History Narrative   Not on file   Social Determinants of Health   Financial Resource Strain: Not on file  Food Insecurity: Not on file  Transportation Needs: Not on file  Physical Activity: Not on file  Stress: Not on file  Social Connections: Not on file   Additional Social History:    Allergies:  No Known Allergies  Labs:  Results for orders placed or performed during the hospital encounter of 02/07/21 (from the past 48 hour(s))  Glucose, capillary     Status: Abnormal   Collection Time:  02/28/21  7:49 PM  Result Value Ref Range   Glucose-Capillary 181 (H) 70 - 99 mg/dL    Comment: Glucose reference range applies only to samples taken after fasting for at least 8 hours.  Glucose, capillary     Status: Abnormal   Collection Time: 02/28/21 11:40 PM  Result Value Ref Range   Glucose-Capillary 212 (H) 70 - 99 mg/dL    Comment: Glucose reference range applies only to samples taken after fasting for at least 8 hours.  Glucose, capillary     Status: Abnormal   Collection Time: 03/01/21  4:03 AM  Result Value Ref Range   Glucose-Capillary 69 (L) 70 - 99 mg/dL    Comment: Glucose reference range applies only to samples taken after fasting for at least 8 hours.  Glucose, capillary     Status: Abnormal   Collection Time: 03/01/21  8:26 AM  Result Value Ref Range   Glucose-Capillary 132 (H) 70 - 99 mg/dL    Comment: Glucose reference range applies only to samples taken after fasting for at least 8 hours.  Glucose, capillary     Status: Abnormal   Collection Time: 03/01/21  2:37 PM  Result Value Ref Range   Glucose-Capillary 208 (H) 70 - 99 mg/dL    Comment: Glucose reference range applies only to samples taken after fasting for at least 8 hours.  Glucose, capillary     Status: Abnormal   Collection Time: 03/01/21  4:40 PM  Result Value Ref Range   Glucose-Capillary 239 (H) 70 - 99 mg/dL    Comment: Glucose reference range applies only to samples taken after fasting for at least 8 hours.  Glucose, capillary     Status: Abnormal   Collection Time: 03/01/21  7:48 PM  Result Value Ref Range   Glucose-Capillary 167 (H) 70 - 99 mg/dL    Comment: Glucose reference range applies only to samples taken after fasting for at least 8 hours.  Glucose, capillary     Status: Abnormal   Collection Time: 03/01/21 11:44 PM  Result Value Ref Range   Glucose-Capillary 178 (H) 70 - 99 mg/dL    Comment: Glucose reference range applies only to samples taken after fasting for at least 8 hours.   Glucose, capillary     Status: Abnormal   Collection Time: 03/02/21  4:10 AM  Result Value Ref Range   Glucose-Capillary 260 (H) 70 - 99 mg/dL    Comment: Glucose reference range applies only to samples taken after fasting for at least 8 hours.  Basic metabolic panel     Status: Abnormal  Collection Time: 03/02/21  4:47 AM  Result Value Ref Range   Sodium 133 (L) 135 - 145 mmol/L   Potassium 4.1 3.5 - 5.1 mmol/L   Chloride 102 98 - 111 mmol/L   CO2 24 22 - 32 mmol/L   Glucose, Bld 231 (H) 70 - 99 mg/dL    Comment: Glucose reference range applies only to samples taken after fasting for at least 8 hours.   BUN 18 8 - 23 mg/dL   Creatinine, Ser 8.89 (L) 0.44 - 1.00 mg/dL   Calcium 8.8 (L) 8.9 - 10.3 mg/dL   GFR, Estimated >16 >94 mL/min    Comment: (NOTE) Calculated using the CKD-EPI Creatinine Equation (2021)    Anion gap 7 5 - 15    Comment: Performed at [facility], [address] [place], [place] [address]  Magnesium     Status: None   Collection Time: 03/02/21  4:47 AM  Result Value Ref Range   Magnesium 1.8 1.7 - 2.4 mg/dL    Comment: Performed at [facility], [address] [place], [place] [address]  Phosphorus     Status: None   Collection Time: 03/02/21  4:47 AM  Result Value Ref Range   Phosphorus 3.4 2.5 - 4.6 mg/dL    Comment: Performed at [facility], [address] [place], [place] [address]  Glucose, capillary     Status: Abnormal   Collection Time: 03/02/21  7:19 AM  Result Value Ref Range   Glucose-Capillary 101 (H) 70 - 99 mg/dL    Comment: Glucose reference range applies only to samples taken after fasting for at least 8 hours.   Comment 1 Notify RN    Comment 2 Document in Chart   Glucose, capillary     Status: Abnormal   Collection Time: 03/02/21 11:38 AM  Result Value Ref Range   Glucose-Capillary 239 (H) 70 - 99 mg/dL    Comment: Glucose reference range applies only to samples taken after fasting for at least 8  hours.   Comment 1 Notify RN    Comment 2 Document in Chart   Glucose, capillary     Status: Abnormal   Collection Time: 03/02/21  4:43 PM  Result Value Ref Range   Glucose-Capillary 148 (H) 70 - 99 mg/dL    Comment: Glucose reference range applies only to samples taken after fasting for at least 8 hours.   Comment 1 Notify RN    Comment 2 Document in Chart     Current Facility-Administered Medications  Medication Dose Route Frequency Provider Last Rate Last Admin   0.9 %  sodium chloride infusion  500 mL Intravenous Once Karlissa Aron, Jackquline Denmark, MD       acetaminophen (TYLENOL) suppository 650 mg  650 mg Rectal Q4H PRN Jimmye Norman, NP       acetaminophen (TYLENOL) tablet 650 mg  650 mg Oral Q4H PRN Harlon Ditty D, NP       ascorbic acid (VITAMIN C) tablet 500 mg  500 mg Per Tube Daily Charise Killian, MD   500 mg at 03/02/21 1238   chlorhexidine (PERIDEX) 0.12 % solution 15 mL  15 mL Mouth Rinse BID Alberteen Sam, MD   15 mL at 03/02/21 1000   Chlorhexidine Gluconate Cloth 2 % PADS 6 each  6 each Topical Daily Erin Fulling, MD   6 each at 03/02/21 1230   docusate sodium (COLACE) capsule 100 mg  100 mg Oral BID PRN Elvina Sidle,  Braulio Conte, NP       enoxaparin (LOVENOX) injection 40 mg  40 mg Subcutaneous Q24H Angelique Blonder, RPH   40 mg at 03/01/21 1953   feeding supplement (OSMOLITE 1.2 CAL) liquid 1,000 mL  1,000 mL Per Tube Continuous Alford Highland, MD 55 mL/hr at 03/01/21 2231 1,000 mL at 03/01/21 2231   free water 30 mL  30 mL Per Tube Q4H Alford Highland, MD   30 mL at 03/02/21 1239   insulin aspart (novoLOG) injection 0-9 Units  0-9 Units Subcutaneous Q4H Alford Highland, MD   3 Units at 03/02/21 1238   MEDLINE mouth rinse  15 mL Mouth Rinse q12n4p Danford, Earl Lites, MD   15 mL at 03/02/21 1239   metoprolol tartrate (LOPRESSOR) 25 mg/10 mL oral suspension 12.5 mg  12.5 mg Per Tube BID Alford Highland, MD   12.5 mg at 03/02/21 1229   morphine 2 MG/ML  injection 2 mg  2 mg Intravenous Q4H PRN Jimmye Norman, NP       ondansetron [facility]) injection 4 mg  4 mg Intravenous Q6H PRN Judithe Modest, NP       polyethylene glycol (MIRALAX / GLYCOLAX) packet 17 g  17 g Oral Daily PRN Judithe Modest, NP       thiothixene (NAVANE) capsule 2 mg  2 mg Per Tube BID Ryann Pauli, Jackquline Denmark, MD   2 mg at 03/02/21 1100    Musculoskeletal: Strength & Muscle Tone: decreased Gait & Station: unable to stand Patient leans: N/A            Psychiatric Specialty Exam:  Presentation  General Appearance:  No data recorded Eye Contact: No data recorded Speech: No data recorded Speech Volume: No data recorded Handedness: No data recorded  Mood and Affect  Mood: No data recorded Affect: No data recorded  Thought Process  Thought Processes: No data recorded Descriptions of Associations:No data recorded Orientation:No data recorded Thought Content:No data recorded History of Schizophrenia/Schizoaffective disorder:No data recorded Duration of Psychotic Symptoms:No data recorded Hallucinations:No data recorded Ideas of Reference:No data recorded Suicidal Thoughts:No data recorded Homicidal Thoughts:No data recorded  Sensorium  Memory: No data recorded Judgment: No data recorded Insight: No data recorded  Executive Functions  Concentration: No data recorded Attention Span: No data recorded Recall: No data recorded Fund of Knowledge: No data recorded Language: No data recorded  Psychomotor Activity  Psychomotor Activity: No data recorded  Assets  Assets: No data recorded  Sleep  Sleep: No data recorded  Physical Exam: Physical Exam Vitals and nursing note reviewed.  Constitutional:      Appearance: Normal appearance.  HENT:     Head: Normocephalic and atraumatic.     Mouth/Throat:     Pharynx: Oropharynx is clear.  Eyes:     Pupils: Pupils are equal, round, and reactive to light.  Cardiovascular:      Rate and Rhythm: Normal rate and regular rhythm.  Pulmonary:     Effort: Pulmonary effort is normal.     Breath sounds: Normal breath sounds.  Abdominal:     General: Abdomen is flat.     Palpations: Abdomen is soft.  Musculoskeletal:        General: Normal range of motion.  Skin:    General: Skin is warm and dry.  Neurological:     General: No focal deficit present.     Mental Status: Mental status is at baseline.  Psychiatric:        Speech:  She is noncommunicative.   Review of Systems  Unable to perform ROS: Psychiatric disorder  Blood pressure (!) 141/52, pulse 83, temperature 97.6 F (36.4 C), resp. rate 14, height 5\' 2"  (1.575 m), weight 45.1 kg, SpO2 99 %. Body mass index is 18.19 kg/m.  Treatment Plan Summary: Plan continue bilateral ECT next treatment tomorrow.  N.p.o. order in place.  Disposition:  See above.  Ultimate disposition will depend on how much function and improvement she regains  , MD 03/02/2021 4:59 PM

## 2021-03-02 NOTE — Plan of Care (Signed)
  Problem: Education: Goal: Knowledge of General Education information will improve Description: Including pain rating scale, medication(s)/side effects and non-pharmacologic comfort measures Outcome: Progressing   Problem: Health Behavior/Discharge Planning: Goal: Ability to manage health-related needs will improve Outcome: Progressing   Problem: Clinical Measurements: Goal: Ability to maintain clinical measurements within normal limits will improve Outcome: Progressing Goal: Will remain free from infection Outcome: Progressing Goal: Diagnostic test results will improve Outcome: Progressing Goal: Respiratory complications will improve Outcome: Progressing Goal: Cardiovascular complication will be avoided Outcome: Progressing   Problem: Nutrition: Goal: Adequate nutrition will be maintained Outcome: Progressing   Problem: Coping: Goal: Level of anxiety will decrease Outcome: Progressing   Problem: Elimination: Goal: Will not experience complications related to bowel motility Outcome: Progressing Goal: Will not experience complications related to urinary retention Outcome: Progressing   Problem: Pain Managment: Goal: General experience of comfort will improve Outcome: Progressing   Problem: Safety: Goal: Ability to remain free from injury will improve Outcome: Progressing   Problem: Skin Integrity: Goal: Risk for impaired skin integrity will decrease Outcome: Progressing   Problem: Respiratory: Goal: Will maintain a patent airway Outcome: Progressing Goal: Complications related to the disease process, condition or treatment will be avoided or minimized Outcome: Progressing

## 2021-03-03 ENCOUNTER — Other Ambulatory Visit: Payer: Self-pay | Admitting: Psychiatry

## 2021-03-03 ENCOUNTER — Inpatient Hospital Stay: Payer: Medicare Other | Admitting: Anesthesiology

## 2021-03-03 ENCOUNTER — Encounter: Payer: Self-pay | Admitting: Family Medicine

## 2021-03-03 DIAGNOSIS — U071 COVID-19: Secondary | ICD-10-CM | POA: Diagnosis not present

## 2021-03-03 DIAGNOSIS — R627 Adult failure to thrive: Secondary | ICD-10-CM | POA: Diagnosis not present

## 2021-03-03 DIAGNOSIS — F061 Catatonic disorder due to known physiological condition: Secondary | ICD-10-CM | POA: Diagnosis not present

## 2021-03-03 LAB — GLUCOSE, CAPILLARY
Glucose-Capillary: 111 mg/dL — ABNORMAL HIGH (ref 70–99)
Glucose-Capillary: 130 mg/dL — ABNORMAL HIGH (ref 70–99)
Glucose-Capillary: 152 mg/dL — ABNORMAL HIGH (ref 70–99)
Glucose-Capillary: 152 mg/dL — ABNORMAL HIGH (ref 70–99)
Glucose-Capillary: 182 mg/dL — ABNORMAL HIGH (ref 70–99)
Glucose-Capillary: 194 mg/dL — ABNORMAL HIGH (ref 70–99)
Glucose-Capillary: 95 mg/dL (ref 70–99)

## 2021-03-03 MED ORDER — METHOHEXITAL SODIUM 100 MG/10ML IV SOSY
PREFILLED_SYRINGE | INTRAVENOUS | Status: DC | PRN
  Administered 2021-03-03: 50 mg via INTRAVENOUS

## 2021-03-03 MED ORDER — KETOROLAC TROMETHAMINE 30 MG/ML IJ SOLN
INTRAMUSCULAR | Status: AC
  Filled 2021-03-03: qty 1

## 2021-03-03 MED ORDER — SUCCINYLCHOLINE CHLORIDE 200 MG/10ML IV SOSY
PREFILLED_SYRINGE | INTRAVENOUS | Status: DC | PRN
  Administered 2021-03-03: 70 mg via INTRAVENOUS

## 2021-03-03 NOTE — Anesthesia Postprocedure Evaluation (Signed)
Anesthesia Post Note  Patient: Erica Mueller  Procedure(s) Performed: ECT TX  Patient location during evaluation: PACU Anesthesia Type: General Level of consciousness: patient uncooperative and obtunded/minimal responses Pain management: pain level controlled Vital Signs Assessment: post-procedure vital signs reviewed and stable Respiratory status: spontaneous breathing, nonlabored ventilation and respiratory function stable Cardiovascular status: blood pressure returned to baseline and stable Postop Assessment: no apparent nausea or vomiting Anesthetic complications: no   No notable events documented.   Last Vitals:  Vitals:   03/03/21 1329 03/03/21 1558  BP: (!) 150/59 (!) 146/52  Pulse: 89 77  Resp: 17 14  Temp: 36.8 C 36.7 C  SpO2: 100% 99%    Last Pain:  Vitals:   03/03/21 1329  TempSrc: Axillary  PainSc:                  Foye Deer

## 2021-03-03 NOTE — Plan of Care (Signed)
  Problem: Education: Goal: Knowledge of General Education information will improve Description: Including pain rating scale, medication(s)/side effects and non-pharmacologic comfort measures Outcome: Progressing   Problem: Health Behavior/Discharge Planning: Goal: Ability to manage health-related needs will improve Outcome: Progressing   Problem: Clinical Measurements: Goal: Ability to maintain clinical measurements within normal limits will improve Outcome: Progressing Goal: Will remain free from infection Outcome: Progressing Goal: Diagnostic test results will improve Outcome: Progressing Goal: Respiratory complications will improve Outcome: Progressing Goal: Cardiovascular complication will be avoided Outcome: Progressing   Problem: Activity: Goal: Risk for activity intolerance will decrease Outcome: Progressing   Problem: Nutrition: Goal: Adequate nutrition will be maintained Outcome: Progressing   Problem: Coping: Goal: Level of anxiety will decrease Outcome: Progressing   Problem: Elimination: Goal: Will not experience complications related to bowel motility Outcome: Progressing Goal: Will not experience complications related to urinary retention Outcome: Progressing   Problem: Pain Managment: Goal: General experience of comfort will improve Outcome: Progressing   Problem: Safety: Goal: Ability to remain free from injury will improve Outcome: Progressing   Problem: Skin Integrity: Goal: Risk for impaired skin integrity will decrease Outcome: Progressing   Problem: Respiratory: Goal: Will maintain a patent airway Outcome: Progressing Goal: Complications related to the disease process, condition or treatment will be avoided or minimized Outcome: Progressing   

## 2021-03-03 NOTE — Procedures (Signed)
ECT SERVICES Physician's Interval Evaluation & Treatment Note  Patient Identification: Erica Mueller MRN:  [id number] Date of Evaluation:  03/03/2021 TX #: 2  MADRS:   MMSE:   P.E. Findings:  To my assessment the patient seems a little more interactive and awake.  Basic physical exam unchanged  Psychiatric Interval Note:  Still nonverbal but a little more awake and alert  Subjective:  Patient is a 70 y.o. female seen for evaluation for Electroconvulsive Therapy. Nonverbal with no complaint  Treatment Summary:   []   Right Unilateral             [x]  Bilateral   % Energy : 1.0 ms 60%   Impedance: 2400 ohms  Seizure Energy Index: No reading  Postictal Suppression Index: No reading  Seizure Concordance Index: No reading  Medications  Pre Shock: Toradol 30 mg Brevital 50 mg succinylcholine 70 mg  Post Shock:    Seizure Duration: 18 seconds EMG 32 seconds EEG   Comments: Computer had a poor quality reading but patient had a clear seizure well over 20 seconds.  Well tolerated no complications.  Next treatment will be Wednesday  Lungs:  [x]   Clear to auscultation               []  Other:   Heart:    [x]   Regular rhythm             []  irregular rhythm    [x]   Previous H&P reviewed, patient examined and there are NO CHANGES                 []   Previous H&P reviewed, patient examined and there are changes noted.   , MD 9/23/20224:43 PM

## 2021-03-03 NOTE — H&P (Signed)
Erica Mueller is an 70 y.o. female.   Chief Complaint: Patient remains nonverbal and has no complaints HPI: Patient with a history of schizoaffective disorder currently in a catatonic depression.  Patient is a little bit better than she was on Wednesday in terms of being able to keep her eyes open making eye contact and at times seeming to give some indication of having heard what is said to her but is still nonverbal.  Past Medical History:  Diagnosis Date   Coronary artery disease    Depression    Diabetes mellitus without complication (HCC)    Elevated lipids    GERD (gastroesophageal reflux disease)    Hypertension    Schizophrenia (HCC)    Syphilis (acquired)     Past Surgical History:  Procedure Laterality Date   COLONOSCOPY     COLONOSCOPY WITH PROPOFOL N/A 09/01/2015   Procedure: COLONOSCOPY WITH PROPOFOL;  Surgeon: Wallace Cullens, MD;  Location: [facility] ENDOSCOPY;  Service: Gastroenterology;  Laterality: N/A;   IR FLUORO RM 30-60 MIN  02/23/2021   IR GASTROSTOMY TUBE MOD SED  02/24/2021   IR REPLC GASTRO/COLONIC TUBE PERCUT W/FLUORO  02/27/2021   TUBAL LIGATION     wisdom teeth removal      Family History  Problem Relation Age of Onset   Cancer Mother    Alcoholism Mother    Bone cancer Father    Heart disease Father    Diabetes Maternal Grandmother    Suicidality Paternal Uncle    Breast cancer Neg Hx    Social History:  reports that she has been smoking cigarettes. She started smoking about 51 years ago. She has a 48.00 pack-year smoking history. She has never used smokeless tobacco. She reports current alcohol use of about 3.0 standard drinks per week. She reports that she does not currently use drugs after having used the following drugs: Marijuana.  Allergies: No Known Allergies  Medications Prior to Admission  Medication Sig Dispense Refill   amitriptyline (ELAVIL) 25 MG tablet Take 1 tablet (25 mg total) by mouth at bedtime. 14 tablet 0   chlorproMAZINE (THORAZINE)  25 MG tablet Take 25 mg by mouth 2 (two) times daily.     cholecalciferol (VITAMIN D3) 25 MCG (1000 UNIT) tablet Take 1,000 Units by mouth daily.     glimepiride (AMARYL) 2 MG tablet Take 2 mg by mouth daily.     lovastatin (MEVACOR) 40 MG tablet Take 1 tablet (40 mg total) by mouth at bedtime. 60 tablet 2   metFORMIN (GLUCOPHAGE) 1000 MG tablet Take 1,000 mg by mouth 2 (two) times daily.     metoprolol tartrate (LOPRESSOR) 25 MG tablet Take 1 tablet (25 mg total) by mouth 2 (two) times daily. 60 tablet 0   paliperidone (INVEGA) 6 MG 24 hr tablet Take 6 mg by mouth daily.     polyethylene glycol (MIRALAX / GLYCOLAX) 17 g packet Take 17 g by mouth daily.     senna-docusate (SENOKOT-S) 8.6-50 MG tablet Take 1 tablet by mouth at bedtime. 30 tablet 0   senna-docusate (SENOKOT-S) 8.6-50 MG tablet Take 1 tablet by mouth 2 (two) times daily. (0800 and 1600)     thiothixene (NAVANE) 2 MG capsule Take 1 capsule (2 mg total) by mouth 2 (two) times daily. 28 capsule 0   vitamin C (ASCORBIC ACID) 500 MG tablet Take 500 mg by mouth 2 (two) times daily.     zinc gluconate 50 MG tablet Take 50 mg by mouth  daily.      Results for orders placed or performed during the hospital encounter of 02/07/21 (from the past 48 hour(s))  Glucose, capillary     Status: Abnormal   Collection Time: 03/01/21  2:37 PM  Result Value Ref Range   Glucose-Capillary 208 (H) 70 - 99 mg/dL    Comment: Glucose reference range applies only to samples taken after fasting for at least 8 hours.  Glucose, capillary     Status: Abnormal   Collection Time: 03/01/21  4:40 PM  Result Value Ref Range   Glucose-Capillary 239 (H) 70 - 99 mg/dL    Comment: Glucose reference range applies only to samples taken after fasting for at least 8 hours.  Glucose, capillary     Status: Abnormal   Collection Time: 03/01/21  7:48 PM  Result Value Ref Range   Glucose-Capillary 167 (H) 70 - 99 mg/dL    Comment: Glucose reference range applies only to  samples taken after fasting for at least 8 hours.  Glucose, capillary     Status: Abnormal   Collection Time: 03/01/21 11:44 PM  Result Value Ref Range   Glucose-Capillary 178 (H) 70 - 99 mg/dL    Comment: Glucose reference range applies only to samples taken after fasting for at least 8 hours.  Glucose, capillary     Status: Abnormal   Collection Time: 03/02/21  4:10 AM  Result Value Ref Range   Glucose-Capillary 260 (H) 70 - 99 mg/dL    Comment: Glucose reference range applies only to samples taken after fasting for at least 8 hours.  Basic metabolic panel     Status: Abnormal   Collection Time: 03/02/21  4:47 AM  Result Value Ref Range   Sodium 133 (L) 135 - 145 mmol/L   Potassium 4.1 3.5 - 5.1 mmol/L   Chloride 102 98 - 111 mmol/L   CO2 24 22 - 32 mmol/L   Glucose, Bld 231 (H) 70 - 99 mg/dL    Comment: Glucose reference range applies only to samples taken after fasting for at least 8 hours.   BUN 18 8 - 23 mg/dL   Creatinine, Ser 2.35 (L) 0.44 - 1.00 mg/dL   Calcium 8.8 (L) 8.9 - 10.3 mg/dL   GFR, Estimated >36 >14 mL/min    Comment: (NOTE) Calculated using the CKD-EPI Creatinine Equation (2021)    Anion gap 7 5 - 15    Comment: Performed at [facility], [address] [place], [place] [address]  Magnesium     Status: None   Collection Time: 03/02/21  4:47 AM  Result Value Ref Range   Magnesium 1.8 1.7 - 2.4 mg/dL    Comment: Performed at [facility], [address], [place], [place] [address]  Phosphorus     Status: None   Collection Time: 03/02/21  4:47 AM  Result Value Ref Range   Phosphorus 3.4 2.5 - 4.6 mg/dL    Comment: Performed at [facility], [address] [place], [place] [address]  Glucose, capillary     Status: Abnormal   Collection Time: 03/02/21  7:19 AM  Result Value Ref Range   Glucose-Capillary 101 (H) 70 - 99 mg/dL    Comment: Glucose reference range applies only to samples taken after fasting for at least 8 hours.    Comment 1 Notify RN    Comment 2 Document in Chart   Glucose, capillary     Status: Abnormal   Collection Time: 03/02/21 11:38  AM  Result Value Ref Range   Glucose-Capillary 239 (H) 70 - 99 mg/dL    Comment: Glucose reference range applies only to samples taken after fasting for at least 8 hours.   Comment 1 Notify RN    Comment 2 Document in Chart   Glucose, capillary     Status: Abnormal   Collection Time: 03/02/21  4:43 PM  Result Value Ref Range   Glucose-Capillary 148 (H) 70 - 99 mg/dL    Comment: Glucose reference range applies only to samples taken after fasting for at least 8 hours.   Comment 1 Notify RN    Comment 2 Document in Chart   Glucose, capillary     Status: Abnormal   Collection Time: 03/02/21  7:35 PM  Result Value Ref Range   Glucose-Capillary 128 (H) 70 - 99 mg/dL    Comment: Glucose reference range applies only to samples taken after fasting for at least 8 hours.   Comment 1 Notify RN   Glucose, capillary     Status: Abnormal   Collection Time: 03/02/21 11:24 PM  Result Value Ref Range   Glucose-Capillary 153 (H) 70 - 99 mg/dL    Comment: Glucose reference range applies only to samples taken after fasting for at least 8 hours.   Comment 1 Notify RN   Glucose, capillary     Status: None   Collection Time: 03/03/21  3:45 AM  Result Value Ref Range   Glucose-Capillary 95 70 - 99 mg/dL    Comment: Glucose reference range applies only to samples taken after fasting for at least 8 hours.   Comment 1 Notify RN   Glucose, capillary     Status: Abnormal   Collection Time: 03/03/21  5:33 AM  Result Value Ref Range   Glucose-Capillary 111 (H) 70 - 99 mg/dL    Comment: Glucose reference range applies only to samples taken after fasting for at least 8 hours.  Glucose, capillary     Status: Abnormal   Collection Time: 03/03/21  8:49 AM  Result Value Ref Range   Glucose-Capillary 130 (H) 70 - 99 mg/dL    Comment: Glucose reference range applies only to samples  taken after fasting for at least 8 hours.   No results found.  Review of Systems  Unable to perform ROS: Patient nonverbal  Skin: Negative.    Blood pressure (!) 152/53, pulse 77, temperature 98.4 F (36.9 C), resp. rate 20, height 5\' 2"  (1.575 m), weight 47.2 kg, SpO2 97 %. Physical Exam Constitutional:      Appearance: She is well-developed. She is ill-appearing.  HENT:     Head: Normocephalic and atraumatic.  Eyes:     Conjunctiva/sclera: Conjunctivae normal.     Pupils: Pupils are equal, round, and reactive to light.  Cardiovascular:     Heart sounds: Normal heart sounds.  Pulmonary:     Effort: Pulmonary effort is normal.  Abdominal:     Palpations: Abdomen is soft.  Musculoskeletal:        General: Normal range of motion.     Cervical back: Normal range of motion.  Skin:    General: Skin is warm and dry.  Neurological:     Mental Status: She is alert.  Psychiatric:        Attention and Perception: She is inattentive.        Mood and Affect: Affect is blunt.        Speech: She is noncommunicative.  Behavior: Behavior is withdrawn.     Assessment/Plan Patient tolerated treatment on Wednesday well.  Treatment today.  We hope to continue treatment on Monday if it is possible to get scheduling a ride and continue on with an index course.  Mordecai Rasmussen, MD 03/03/2021, 11:59 AM

## 2021-03-03 NOTE — Progress Notes (Signed)
PROGRESS NOTE    Erica Mueller  POE:[id number] DOB: 1951-02-08 DOA: 02/07/2021 PCP: [name]    Brief Narrative:  Patient admitted 02/07/2021 with severe sepsis COVID-19 infection.  Past medical history of schizophrenia, hypertension, GERD, type 2 diabetes mellitus and depression and CAD.  The patient had a prolonged hospital course and never regained mental status.  She was treated for electrolyte abnormalities including hypernatremia, hypokalemia and hypophosphatemia.  She had an NG tube for 2 weeks and then a PEG tube was placed on 02/24/2021.  There was a PEG tube malfunction 9/19. IR was able to change over the guidewire.  Seen by psychiatry and restarted on psychiatric medications but still catatonic.  Psychiatry started ECT treatment.   Assessment & Plan:   Principal Problem:   Catatonia Active Problems:   Type 2 diabetes mellitus with hyperglycemia, without long-term current use of insulin (HCC)   Essential hypertension   Schizophrenia (HCC)   Sepsis (HCC)   Pressure injury of skin   Protein-calorie malnutrition, severe   Failure to thrive in adult   Hypernatremia   Hypokalemia   AKI (acute kidney injury) (HCC)   COVID-19 virus infection   PEG tube malfunction (HCC)  Schizophrenia with catatonia. Followed by psychiatry, continue ECT. Patient is still nonverbal, continue tube feeding.  COVID infection. Severe sepsis secondary to aspiration pneumonia. Aspiration pneumonia Condition or improved.  Failure to thrive. Severe protein calorie malnutrition. Patient is tolerating tube feeding.  Continue.  Hypernatremia. Hypokalemia. Hypophosphatemia. Condition all resolved.   DVT prophylaxis: Lovenox Code Status: full Family Communication:  Disposition Plan:      Status is: Inpatient   Remains inpatient appropriate because:Altered mental status and Inpatient level of care appropriate due to severity of illness   Dispo: The patient is from: Home               Anticipated d/c is to: Home              Patient currently is not medically stable to d/c.              Difficult to place patient No             I/O last 3 completed shifts: In: 326 [NG/GT:326] Out: -  Total I/O In: 50 [I.V.:50] Out: -      Subjective: Patient just came back from ECT treatment, currently still nonverbal.  Nursing is yet to restart tube feeding. No short of breath or cough. No fever or chills.  Objective: Vitals:   03/03/21 1250 03/03/21 1300 03/03/21 1310 03/03/21 1329  BP: (!) 157/60 (!) 152/60 (!) 154/62 (!) 150/59  Pulse: 94 91 93 89  Resp: (!) 34 (!) 32 (!) 30 17  Temp:   98.1 F (36.7 C) 98.3 F (36.8 C)  TempSrc:    Axillary  SpO2: 100% 99% 97% 100%  Weight:      Height:        Intake/Output Summary (Last 24 hours) at 03/03/2021 1348 Last data filed at 03/03/2021 1246 Gross per 24 hour  Intake 376 ml  Output --  Net 376 ml   Filed Weights   03/01/21 1140 03/03/21 0500 03/03/21 1229  Weight: 45.1 kg 47.2 kg 47.2 kg    Examination:  General exam: Appears calm and comfortable  Respiratory system: Clear to auscultation. Respiratory effort normal. Cardiovascular system: S1 & S2 heard, RRR. No JVD, murmurs, rubs, gallops or clicks. No pedal edema. Gastrointestinal system: Abdomen is nondistended, soft and nontender.  No organomegaly or masses felt. Normal bowel sounds heard. Central nervous system: Alert and nonverbal. No focal neurological deficits. Extremities: Symmetric 5 x 5 power. Skin: No rashes, lesions or ulcers Psychiatry: Judgement and insight appear normal. Mood & affect appropriate.     Data Reviewed: I have personally reviewed following labs and imaging studies  CBC: No results for input(s): WBC, NEUTROABS, HGB, HCT, MCV, PLT in the last 168 hours. Basic Metabolic Panel: Recent Labs  Lab 02/27/21 0631 03/02/21 0447  NA 133* 133*  K 4.6 4.1  CL 102 102  CO2 25 24  GLUCOSE 260* 231*  BUN 18 18   CREATININE 0.54 0.41*  CALCIUM 8.9 8.8*  MG 1.9 1.8  PHOS 3.9 3.4   GFR: Estimated Creatinine Clearance: 48.8 mL/min (A) (by C-G formula based on SCr of 0.41 mg/dL (L)). Liver Function Tests: No results for input(s): AST, ALT, ALKPHOS, BILITOT, PROT, ALBUMIN in the last 168 hours. No results for input(s): LIPASE, AMYLASE in the last 168 hours. No results for input(s): AMMONIA in the last 168 hours. Coagulation Profile: No results for input(s): INR, PROTIME in the last 168 hours. Cardiac Enzymes: No results for input(s): CKTOTAL, CKMB, CKMBINDEX, TROPONINI in the last 168 hours. BNP (last 3 results) No results for input(s): PROBNP in the last 8760 hours. HbA1C: No results for input(s): HGBA1C in the last 72 hours. CBG: Recent Labs  Lab 03/02/21 2324 03/03/21 0345 03/03/21 0533 03/03/21 0849 03/03/21 1248  GLUCAP 153* 95 111* 130* 152*   Lipid Profile: No results for input(s): CHOL, HDL, LDLCALC, TRIG, CHOLHDL, LDLDIRECT in the last 72 hours. Thyroid Function Tests: No results for input(s): TSH, T4TOTAL, FREET4, T3FREE, THYROIDAB in the last 72 hours. Anemia Panel: No results for input(s): VITAMINB12, FOLATE, FERRITIN, TIBC, IRON, RETICCTPCT in the last 72 hours. Sepsis Labs: No results for input(s): PROCALCITON, LATICACIDVEN in the last 168 hours.  No results found for this or any previous visit (from the past 240 hour(s)).       Radiology Studies: No results found.      Scheduled Meds:  vitamin C  500 mg Per Tube Daily   chlorhexidine  15 mL Mouth Rinse BID   Chlorhexidine Gluconate Cloth  6 each Topical Daily   enoxaparin (LOVENOX) injection  40 mg Subcutaneous Q24H   free water  30 mL Per Tube Q4H   insulin aspart  0-9 Units Subcutaneous Q4H   ketorolac       mouth rinse  15 mL Mouth Rinse q12n4p   metoprolol tartrate  12.5 mg Per Tube BID   thiothixene  2 mg Per Tube BID   Continuous Infusions:  feeding supplement (OSMOLITE 1.2 CAL) Stopped  (03/03/21 0001)     LOS: 24 days    Time spent: 21 minutes    Marrion Coy, MD [facility]   To contact the attending provider between 7A-7P or the covering provider during after hours 7P-7A, please log into the web site [email or web address] and access using universal [place] password for that web site. If you do not have the password, please call the hospital operator.  03/03/2021, 1:48 PM

## 2021-03-03 NOTE — Transfer of Care (Signed)
Immediate Anesthesia Transfer of Care Note  Patient: Erica Mueller  Procedure(s) Performed: ECT TX  Patient Location: PACU  Anesthesia Type:General  Level of Consciousness: drowsy  Airway & Oxygen Therapy: Patient Spontanous Breathing and Patient connected to face mask oxygen  Post-op Assessment: Report given to RN and Post -op Vital signs reviewed and stable  Post vital signs: Reviewed and stable  Last Vitals:  Vitals Value Taken Time  BP 179/57 03/03/21 1245  Temp    Pulse 93 03/03/21 1246  Resp 38 03/03/21 1246  SpO2 100 % 03/03/21 1246  Vitals shown include unvalidated device data.  Last Pain:  Vitals:   03/03/21 1229  TempSrc: Tympanic  PainSc: 0-No pain         Complications: No notable events documented.

## 2021-03-03 NOTE — Brief Op Note (Addendum)
Pt brief saturated with urine and small amount of brown stool. Staff able to roll pt onto side to clean with wipes. New chuck and brief placed at this time. Pt covered up with warm blankets.    Pt moving eyelids but not speaking to staff. Pt moved limbs a little when stimulated. Pt moving head when being talked to. Per ECT staff this is pt baseline. No distress noted at this time.

## 2021-03-03 NOTE — Anesthesia Procedure Notes (Signed)
Procedure Name: General with mask airway Date/Time: 03/03/2021 12:25 PM Performed by: Joanette Gula, Lakashia Collison, CRNA Pre-anesthesia Checklist: Patient identified, Emergency Drugs available, Suction available, Patient being monitored and Timeout performed Patient Re-evaluated:Patient Re-evaluated prior to induction Oxygen Delivery Method: Circle system utilized Preoxygenation: Pre-oxygenation with 100% oxygen Induction Type: IV induction Ventilation: Mask ventilation without difficulty and Mask ventilation throughout procedure

## 2021-03-03 NOTE — Plan of Care (Signed)
Patient appears comfortable in bed. Nonverbal indicators of pain absent. No new changes in assessment. NPO for ECT treatment planned for today.  PLAN OF CARE ONGOING Problem: Education: Goal: Knowledge of General Education information will improve Description: Including pain rating scale, medication(s)/side effects and non-pharmacologic comfort measures Outcome: Progressing   Problem: Health Behavior/Discharge Planning: Goal: Ability to manage health-related needs will improve Outcome: Progressing   Problem: Clinical Measurements: Goal: Ability to maintain clinical measurements within normal limits will improve Outcome: Progressing Goal: Will remain free from infection Outcome: Progressing Goal: Diagnostic test results will improve Outcome: Progressing Goal: Respiratory complications will improve Outcome: Progressing Goal: Cardiovascular complication will be avoided Outcome: Progressing   Problem: Activity: Goal: Risk for activity intolerance will decrease Outcome: Progressing   Problem: Nutrition: Goal: Adequate nutrition will be maintained Outcome: Progressing   Problem: Coping: Goal: Level of anxiety will decrease Outcome: Progressing   Problem: Elimination: Goal: Will not experience complications related to bowel motility Outcome: Progressing Goal: Will not experience complications related to urinary retention Outcome: Progressing   Problem: Pain Managment: Goal: General experience of comfort will improve Outcome: Progressing   Problem: Safety: Goal: Ability to remain free from injury will improve Outcome: Progressing   Problem: Skin Integrity: Goal: Risk for impaired skin integrity will decrease Outcome: Progressing   Problem: Respiratory: Goal: Will maintain a patent airway Outcome: Progressing Goal: Complications related to the disease process, condition or treatment will be avoided or minimized Outcome: Progressing

## 2021-03-03 NOTE — Anesthesia Postprocedure Evaluation (Signed)
Anesthesia Post Note  Patient: Erica Mueller  Procedure(s) Performed: ECT TX  Patient location during evaluation: PACU Anesthesia Type: General Level of consciousness: patient uncooperative Pain management: pain level controlled Vital Signs Assessment: post-procedure vital signs reviewed and stable Respiratory status: spontaneous breathing, nonlabored ventilation and respiratory function stable Cardiovascular status: blood pressure returned to baseline and stable Postop Assessment: no apparent nausea or vomiting Anesthetic complications: no   No notable events documented.   Last Vitals:  Vitals:   03/03/21 1250 03/03/21 1300  BP: (!) 157/60 (!) 152/60  Pulse: 94 91  Resp: (!) 34 (!) 32  Temp:    SpO2: 100% 99%    Last Pain:  Vitals:   03/03/21 1245  TempSrc:   PainSc: Asleep                 Foye Deer

## 2021-03-03 NOTE — Anesthesia Preprocedure Evaluation (Signed)
Anesthesia Evaluation  Patient identified by MRN, date of birth, ID band Patient unresponsive  General Assessment Comment:Spontaneous eye opening. Pt does not speak or move extremities to command.   Reviewed: Allergy & Precautions, NPO status , Patient's Chart, lab work & pertinent test results  History of Anesthesia Complications Negative for: history of anesthetic complications  Airway       Comment: Unable to examine due to patient's mental condition Dental  (+) Upper Dentures, Lower Dentures   Pulmonary COPD, Current Smoker,  Aspiration pneumonia with sepsis on admission.  This has resolved   Pulmonary exam normal        Cardiovascular hypertension, + CAD  Normal cardiovascular exam+ dysrhythmias (Previous nonsustained ventricular tachycardia secondary to electrolyte abnormalities.  On low-dose metoprolol)  Rhythm:Regular Rate:Normal  ECHO 02/2021: 1. Left ventricular ejection fraction, by estimation, is 30 to 35%. The  left ventricle has moderate to severely decreased function. The left  ventricle demonstrates global hypokinesis. Left ventricular diastolic  parameters are consistent with Grade I  diastolic dysfunction (impaired relaxation).  2. Right ventricular systolic function was not well visualized. The right  ventricular size is normal.  3. The mitral valve is degenerative. Mild mitral valve regurgitation.  4. The aortic valve was not well visualized. Aortic valve regurgitation  is mild to moderate.    Neuro/Psych Depression Catatonia Schizophrenianegative neurological ROS     GI/Hepatic Neg liver ROS, GERD  ,  Endo/Other  diabetes, Type 2, Oral Hypoglycemic Agents, Insulin Dependent  Renal/GU negative Renal ROS     Musculoskeletal   Abdominal Gtube in place. Soft abd  Peds  Hematology negative hematology ROS (+)   Anesthesia Other Findings Anorexia secondary to schizophrenia Depression Type 2  diabetes mellitus with hyperglycemia, without long-term current use of insulin (HCC) Essential hypertension Noncompliance Schizophrenia (HCC) GERD (gastroesophageal reflux disease) History of schizophrenia Hyperlipemia Acute metabolic encephalopathy Severe sepsis (HCC) UTI (urinary tract infection) Sepsis (HCC) Pressure injury of skin Protein-calorie malnutrition, severe Failure to thrive in adult Hypernatremia Hypokalemia Catatonia AKI (acute kidney injury) (HCC) COVID-19 virus infection PEG tube malfunction (HCC) Sacral decubitus ulcer, present on admission Severe protein calorie malnutrition    Reproductive/Obstetrics                            Anesthesia Physical  Anesthesia Plan  ASA: III  Anesthesia Plan: General   Post-op Pain Management:    Induction: Intravenous  PONV Risk Score and Plan: 2 and Ondansetron and TIVA  Airway Management Planned: Mask  Additional Equipment:   Intra-op Plan:   Post-operative Plan:   Informed Consent: I have reviewed the patients History and Physical, chart, labs and discussed the procedure including the risks, benefits and alternatives for the proposed anesthesia with the patient or authorized representative who has indicated his/her understanding and acceptance.     Dental advisory given and Consent reviewed with POA  Plan Discussed with: CRNA and Surgeon  Anesthesia Plan Comments: (Previously discussed anesthesia with son who agrees to serial consent.)       Anesthesia Quick Evaluation

## 2021-03-04 DIAGNOSIS — F061 Catatonic disorder due to known physiological condition: Secondary | ICD-10-CM | POA: Diagnosis not present

## 2021-03-04 DIAGNOSIS — U071 COVID-19: Secondary | ICD-10-CM | POA: Diagnosis not present

## 2021-03-04 DIAGNOSIS — R627 Adult failure to thrive: Secondary | ICD-10-CM | POA: Diagnosis not present

## 2021-03-04 LAB — GLUCOSE, CAPILLARY
Glucose-Capillary: 167 mg/dL — ABNORMAL HIGH (ref 70–99)
Glucose-Capillary: 167 mg/dL — ABNORMAL HIGH (ref 70–99)
Glucose-Capillary: 179 mg/dL — ABNORMAL HIGH (ref 70–99)
Glucose-Capillary: 202 mg/dL — ABNORMAL HIGH (ref 70–99)
Glucose-Capillary: 210 mg/dL — ABNORMAL HIGH (ref 70–99)
Glucose-Capillary: 228 mg/dL — ABNORMAL HIGH (ref 70–99)

## 2021-03-04 NOTE — Progress Notes (Signed)
PROGRESS NOTE    Erica Mueller  HDQ:[id number] DOB: Apr 12, 1951 DOA: 02/07/2021 PCP: [name]    Brief Narrative:  Patient admitted 02/07/2021 with severe sepsis COVID-19 infection.  Past medical history of schizophrenia, hypertension, GERD, type 2 diabetes mellitus and depression and CAD.  The patient had a prolonged hospital course and never regained mental status.  She was treated for electrolyte abnormalities including hypernatremia, hypokalemia and hypophosphatemia.  She had an NG tube for 2 weeks and then a PEG tube was placed on 02/24/2021.  There was a PEG tube malfunction 9/19. IR was able to change over the guidewire.  Seen by psychiatry and restarted on psychiatric medications but still catatonic.  Psychiatry started ECT treatment.   Assessment & Plan:   Principal Problem:   Catatonia Active Problems:   Type 2 diabetes mellitus with hyperglycemia, without long-term current use of insulin (HCC)   Essential hypertension   Schizophrenia (HCC)   Sepsis (HCC)   Pressure injury of skin   Protein-calorie malnutrition, severe   Failure to thrive in adult   Hypernatremia   Hypokalemia   AKI (acute kidney injury) (HCC)   COVID-19 virus infection   PEG tube malfunction (HCC)  Schizophrenia with catatonia. Patient is still nonverbal, continue tube feeding. Psychiatry is continued on ECT treatment.  COVID infection. Severe sepsis secondary to aspiration pneumonia. Aspiration pneumonia. Antibiotic completed, patient is doing well.  Failure to thrive. Severe protein calorie malnutrition Continue tube feeding.  Hypernatremia. Hypokalemia. Hypophosphatemia. Condition are stable.    DVT prophylaxis: Lovenox Code Status: full Family Communication:  Disposition Plan:      Status is: Inpatient   Remains inpatient appropriate because:Altered mental status and Inpatient level of care appropriate due to severity of illness   Dispo: The patient is from: Home               Anticipated d/c is to: Home              Patient currently is not medically stable to d/c.              Difficult to place patient No       I/O last 3 completed shifts: In: 1033 [I.V.:50; NG/GT:983] Out: -  No intake/output data recorded.     Consultants:  Psych  Procedures: None  Antimicrobials: None  Subjective: Patient is still nonverbal, tolerating tube feeding.  Still NPO. No short of breath or cough. No fever chills pain No abdominal pain or nausea vomiting.  Objective: Vitals:   03/03/21 2300 03/04/21 0359 03/04/21 0500 03/04/21 0808  BP: (!) 152/50 (!) 149/49  (!) 143/51  Pulse: 74 73  72  Resp: 16 16  16   Temp: 97.7 F (36.5 C) 97.6 F (36.4 C)  98 F (36.7 C)  TempSrc: Axillary     SpO2: 100% 100%  97%  Weight:   47.1 kg   Height:        Intake/Output Summary (Last 24 hours) at 03/04/2021 1234 Last data filed at 03/04/2021 0602 Gross per 24 hour  Intake 707 ml  Output --  Net 707 ml   Filed Weights   03/03/21 0500 03/03/21 1229 03/04/21 0500  Weight: 47.2 kg 47.2 kg 47.1 kg    Examination:  General exam: Appears calm and comfortable  Respiratory system: Clear to auscultation. Respiratory effort normal. Cardiovascular system: S1 & S2 heard, RRR. No JVD, murmurs, rubs, gallops or clicks. No pedal edema. Gastrointestinal system: Abdomen is nondistended, soft and nontender.  No organomegaly or masses felt. Normal bowel sounds heard. Central nervous system: Alert and nonverbal. No focal neurological deficits. Extremities: Symmetric 5 x 5 power. Skin: No rashes, lesions or ulcers .     Data Reviewed: I have personally reviewed following labs and imaging studies  CBC: No results for input(s): WBC, NEUTROABS, HGB, HCT, MCV, PLT in the last 168 hours. Basic Metabolic Panel: Recent Labs  Lab 02/27/21 0631 03/02/21 0447  NA 133* 133*  K 4.6 4.1  CL 102 102  CO2 25 24  GLUCOSE 260* 231*  BUN 18 18  CREATININE 0.54 0.41*  CALCIUM  8.9 8.8*  MG 1.9 1.8  PHOS 3.9 3.4   GFR: Estimated Creatinine Clearance: 48.7 mL/min (A) (by C-G formula based on SCr of 0.41 mg/dL (L)). Liver Function Tests: No results for input(s): AST, ALT, ALKPHOS, BILITOT, PROT, ALBUMIN in the last 168 hours. No results for input(s): LIPASE, AMYLASE in the last 168 hours. No results for input(s): AMMONIA in the last 168 hours. Coagulation Profile: No results for input(s): INR, PROTIME in the last 168 hours. Cardiac Enzymes: No results for input(s): CKTOTAL, CKMB, CKMBINDEX, TROPONINI in the last 168 hours. BNP (last 3 results) No results for input(s): PROBNP in the last 8760 hours. HbA1C: No results for input(s): HGBA1C in the last 72 hours. CBG: Recent Labs  Lab 03/03/21 2040 03/03/21 2350 03/04/21 0401 03/04/21 0759 03/04/21 1159  GLUCAP 194* 182* 202* 167* 167*   Lipid Profile: No results for input(s): CHOL, HDL, LDLCALC, TRIG, CHOLHDL, LDLDIRECT in the last 72 hours. Thyroid Function Tests: No results for input(s): TSH, T4TOTAL, FREET4, T3FREE, THYROIDAB in the last 72 hours. Anemia Panel: No results for input(s): VITAMINB12, FOLATE, FERRITIN, TIBC, IRON, RETICCTPCT in the last 72 hours. Sepsis Labs: No results for input(s): PROCALCITON, LATICACIDVEN in the last 168 hours.  No results found for this or any previous visit (from the past 240 hour(s)).       Radiology Studies: No results found.      Scheduled Meds:  vitamin C  500 mg Per Tube Daily   chlorhexidine  15 mL Mouth Rinse BID   Chlorhexidine Gluconate Cloth  6 each Topical Daily   enoxaparin (LOVENOX) injection  40 mg Subcutaneous Q24H   free water  30 mL Per Tube Q4H   insulin aspart  0-9 Units Subcutaneous Q4H   mouth rinse  15 mL Mouth Rinse q12n4p   metoprolol tartrate  12.5 mg Per Tube BID   thiothixene  2 mg Per Tube BID   Continuous Infusions:  feeding supplement (OSMOLITE 1.2 CAL) 1,000 mL (03/03/21 1800)     LOS: 25 days    Time spent:  21 minutes    Marrion Coy, MD [facility]   To contact the attending provider between 7A-7P or the covering provider during after hours 7P-7A, please log into the web site [email or web address] and access using universal [place] password for that web site. If you do not have the password, please call the hospital operator.  03/04/2021, 12:34 PM

## 2021-03-04 NOTE — Progress Notes (Signed)
Patient is more alert today.  Not following commands.  Brother came today and the patient told him she wanted some "fried chicken." Patient smiled.

## 2021-03-04 NOTE — Plan of Care (Signed)
  Problem: Education: Goal: Knowledge of General Education information will improve Description: Including pain rating scale, medication(s)/side effects and non-pharmacologic comfort measures Outcome: Progressing   Problem: Health Behavior/Discharge Planning: Goal: Ability to manage health-related needs will improve Outcome: Progressing   Problem: Clinical Measurements: Goal: Ability to maintain clinical measurements within normal limits will improve Outcome: Progressing Goal: Will remain free from infection Outcome: Progressing Goal: Diagnostic test results will improve Outcome: Progressing Goal: Respiratory complications will improve Outcome: Progressing Goal: Cardiovascular complication will be avoided Outcome: Progressing   Problem: Activity: Goal: Risk for activity intolerance will decrease Outcome: Progressing   Problem: Nutrition: Goal: Adequate nutrition will be maintained Outcome: Progressing   Problem: Coping: Goal: Level of anxiety will decrease Outcome: Progressing   Problem: Elimination: Goal: Will not experience complications related to bowel motility Outcome: Progressing Goal: Will not experience complications related to urinary retention Outcome: Progressing   Problem: Pain Managment: Goal: General experience of comfort will improve Outcome: Progressing   Problem: Safety: Goal: Ability to remain free from injury will improve Outcome: Progressing   Problem: Skin Integrity: Goal: Risk for impaired skin integrity will decrease Outcome: Progressing   Problem: Respiratory: Goal: Will maintain a patent airway Outcome: Progressing Goal: Complications related to the disease process, condition or treatment will be avoided or minimized Outcome: Progressing   

## 2021-03-05 DIAGNOSIS — U071 COVID-19: Secondary | ICD-10-CM | POA: Diagnosis not present

## 2021-03-05 DIAGNOSIS — F061 Catatonic disorder due to known physiological condition: Secondary | ICD-10-CM | POA: Diagnosis not present

## 2021-03-05 LAB — GLUCOSE, CAPILLARY
Glucose-Capillary: 158 mg/dL — ABNORMAL HIGH (ref 70–99)
Glucose-Capillary: 165 mg/dL — ABNORMAL HIGH (ref 70–99)
Glucose-Capillary: 171 mg/dL — ABNORMAL HIGH (ref 70–99)
Glucose-Capillary: 199 mg/dL — ABNORMAL HIGH (ref 70–99)
Glucose-Capillary: 224 mg/dL — ABNORMAL HIGH (ref 70–99)
Glucose-Capillary: 246 mg/dL — ABNORMAL HIGH (ref 70–99)

## 2021-03-05 NOTE — Progress Notes (Signed)
During bedside shift report, patient awake and alert.  Patient able to tell this nurse "slept well". Patient smiling.

## 2021-03-05 NOTE — Plan of Care (Signed)
  Problem: Education: Goal: Knowledge of General Education information will improve Description: Including pain rating scale, medication(s)/side effects and non-pharmacologic comfort measures Outcome: Progressing   Problem: Health Behavior/Discharge Planning: Goal: Ability to manage health-related needs will improve Outcome: Progressing   Problem: Clinical Measurements: Goal: Ability to maintain clinical measurements within normal limits will improve Outcome: Progressing Goal: Will remain free from infection Outcome: Progressing Goal: Diagnostic test results will improve Outcome: Progressing Goal: Respiratory complications will improve Outcome: Progressing Goal: Cardiovascular complication will be avoided Outcome: Progressing   Problem: Activity: Goal: Risk for activity intolerance will decrease Outcome: Progressing   Problem: Nutrition: Goal: Adequate nutrition will be maintained Outcome: Progressing   Problem: Coping: Goal: Level of anxiety will decrease Outcome: Progressing   Problem: Elimination: Goal: Will not experience complications related to bowel motility Outcome: Progressing Goal: Will not experience complications related to urinary retention Outcome: Progressing   Problem: Pain Managment: Goal: General experience of comfort will improve Outcome: Progressing   Problem: Safety: Goal: Ability to remain free from injury will improve Outcome: Progressing   Problem: Skin Integrity: Goal: Risk for impaired skin integrity will decrease Outcome: Progressing   Problem: Respiratory: Goal: Will maintain a patent airway Outcome: Progressing Goal: Complications related to the disease process, condition or treatment will be avoided or minimized Outcome: Progressing   

## 2021-03-05 NOTE — Plan of Care (Signed)
Patient sleeping between care. Oriented to self. Pt able to answer yes or no questions. Denies pain. Follows some commands. TF ongoing. Bed alarm on.  PLAN OF CARE ONGOING Problem: Education: Goal: Knowledge of General Education information will improve Description: Including pain rating scale, medication(s)/side effects and non-pharmacologic comfort measures Outcome: Progressing   Problem: Health Behavior/Discharge Planning: Goal: Ability to manage health-related needs will improve Outcome: Progressing   Problem: Clinical Measurements: Goal: Ability to maintain clinical measurements within normal limits will improve Outcome: Progressing Goal: Will remain free from infection Outcome: Progressing Goal: Diagnostic test results will improve Outcome: Progressing Goal: Respiratory complications will improve Outcome: Progressing Goal: Cardiovascular complication will be avoided Outcome: Progressing   Problem: Activity: Goal: Risk for activity intolerance will decrease Outcome: Progressing   Problem: Nutrition: Goal: Adequate nutrition will be maintained Outcome: Progressing   Problem: Coping: Goal: Level of anxiety will decrease Outcome: Progressing   Problem: Elimination: Goal: Will not experience complications related to bowel motility Outcome: Progressing Goal: Will not experience complications related to urinary retention Outcome: Progressing   Problem: Pain Managment: Goal: General experience of comfort will improve Outcome: Progressing   Problem: Safety: Goal: Ability to remain free from injury will improve Outcome: Progressing   Problem: Skin Integrity: Goal: Risk for impaired skin integrity will decrease Outcome: Progressing   Problem: Respiratory: Goal: Will maintain a patent airway Outcome: Progressing Goal: Complications related to the disease process, condition or treatment will be avoided or minimized Outcome: Progressing

## 2021-03-05 NOTE — TOC Progression Note (Addendum)
Transition of Care [facility]) - Progression Note    Patient Details  Name: Erica Mueller MRN: [id number] Date of Birth: 06/28/1950  [facility] CM/SW Contact  Barrie Dunker, RN Phone Number: 03/05/2021, 11:04 AM  Clinical Narrative:    ECT treatments will continue, The patient is from [facility] and will return to LTC at [facility], She has made improvement and is verbal, unable to form sentences but can communicate TOC to continue to Monitor   Expected Discharge Plan: Long Term Nursing Home Barriers to Discharge: Continued Medical Work up  Expected Discharge Plan and Services Expected Discharge Plan: Long Term Nursing Home In-house Referral: Clinical Social Work   Post Acute Care Choice: Nursing Home Living arrangements for the past 2 months:  ([facility] / Long Term Care Facility)                                       Social Determinants of Health (SDOH) Interventions    Readmission Risk Interventions No flowsheet data found.

## 2021-03-05 NOTE — Progress Notes (Signed)
PROGRESS NOTE    Erica Mueller  ZOX:[id number] DOB: 09/15/1950 DOA: 02/07/2021 PCP: [name]    Brief Narrative:  Patient admitted 02/07/2021 with severe sepsis COVID-19 infection.  Past medical history of schizophrenia, hypertension, GERD, type 2 diabetes mellitus and depression and CAD.  The patient had a prolonged hospital course and never regained mental status.  She was treated for electrolyte abnormalities including hypernatremia, hypokalemia and hypophosphatemia.  She had an NG tube for 2 weeks and then a PEG tube was placed on 02/24/2021.  There was a PEG tube malfunction 9/19. IR was able to change over the guidewire.  Seen by psychiatry and restarted on psychiatric medications but still catatonic.  Psychiatry started ECT treatment.   Assessment & Plan:   Principal Problem:   Catatonia Active Problems:   Type 2 diabetes mellitus with hyperglycemia, without long-term current use of insulin (HCC)   Essential hypertension   Schizophrenia (HCC)   Sepsis (HCC)   Pressure injury of skin   Protein-calorie malnutrition, severe   Failure to thrive in adult   Hypernatremia   Hypokalemia   AKI (acute kidney injury) (HCC)   COVID-19 virus infection   PEG tube malfunction (HCC)  Schizophrenia with catatonia. Patient started talking today, but she still disoriented to time place.  She was able to say good morning and her name the first time. Patient appears to be improving.  COVID infection. Severe sepsis secondary to aspiration pneumonia. Aspiration pneumonia. Condition improved  Failure to thrive. Severe protein calorie malnutrition. Continue tube feeding for      DVT prophylaxis: Lovenox Code Status: full Family Communication:  Disposition Plan:    Status is: Inpatient  Remains inpatient appropriate because:Ongoing diagnostic testing needed not appropriate for outpatient work up and Inpatient level of care appropriate due to severity of illness  Dispo:  The patient is from: Home              Anticipated d/c is to: Home              Patient currently is not medically stable to d/c.   Difficult to place patient No        I/O last 3 completed shifts: In: 1349 [NG/GT:1349] Out: -  No intake/output data recorded.     Consultants:  Psych  Procedures: ECT  Antimicrobials: None Subjective: Patient started talking today the first time, she was able to say good morning and tell her name.  But she still disorientated. She is tolerating tube feeding without nausea vomiting abdominal pain. No short of breath or cough. No dysuria or hematuria.  Objective: Vitals:   03/05/21 0338 03/05/21 0500 03/05/21 0755 03/05/21 1136  BP: (!) 146/53  (!) 133/50 (!) 128/45  Pulse: 70  79 79  Resp: 16  14 15   Temp: 97.6 F (36.4 C)  98.3 F (36.8 C) 98 F (36.7 C)  TempSrc:   Oral Axillary  SpO2: 100%  98% 98%  Weight:  45.5 kg    Height:        Intake/Output Summary (Last 24 hours) at 03/05/2021 1331 Last data filed at 03/05/2021 0650 Gross per 24 hour  Intake 692 ml  Output --  Net 692 ml   Filed Weights   03/03/21 1229 03/04/21 0500 03/05/21 0500  Weight: 47.2 kg 47.1 kg 45.5 kg    Examination:  General exam: Appears calm and comfortable  Respiratory system: Clear to auscultation. Respiratory effort normal. Cardiovascular system: S1 & S2 heard, RRR. No  JVD, murmurs, rubs, gallops or clicks. No pedal edema. Gastrointestinal system: Abdomen is nondistended, soft and nontender. No organomegaly or masses felt. Normal bowel sounds heard. Central nervous system: Alert and oriented x1. No focal neurological deficits. Extremities: Symmetric 5 x 5 power. Skin: No rashes, lesions or ulcers Psychiatry: Judgement and insight appear normal. Mood & affect appropriate.     Data Reviewed: I have personally reviewed following labs and imaging studies  CBC: No results for input(s): WBC, NEUTROABS, HGB, HCT, MCV, PLT in the last 168  hours. Basic Metabolic Panel: Recent Labs  Lab 02/27/21 0631 03/02/21 0447  NA 133* 133*  K 4.6 4.1  CL 102 102  CO2 25 24  GLUCOSE 260* 231*  BUN 18 18  CREATININE 0.54 0.41*  CALCIUM 8.9 8.8*  MG 1.9 1.8  PHOS 3.9 3.4   GFR: Estimated Creatinine Clearance: 47 mL/min (A) (by C-G formula based on SCr of 0.41 mg/dL (L)). Liver Function Tests: No results for input(s): AST, ALT, ALKPHOS, BILITOT, PROT, ALBUMIN in the last 168 hours. No results for input(s): LIPASE, AMYLASE in the last 168 hours. No results for input(s): AMMONIA in the last 168 hours. Coagulation Profile: No results for input(s): INR, PROTIME in the last 168 hours. Cardiac Enzymes: No results for input(s): CKTOTAL, CKMB, CKMBINDEX, TROPONINI in the last 168 hours. BNP (last 3 results) No results for input(s): PROBNP in the last 8760 hours. HbA1C: No results for input(s): HGBA1C in the last 72 hours. CBG: Recent Labs  Lab 03/04/21 2020 03/04/21 2336 03/05/21 0403 03/05/21 0736 03/05/21 1120  GLUCAP 228* 179* 158* 165* 246*   Lipid Profile: No results for input(s): CHOL, HDL, LDLCALC, TRIG, CHOLHDL, LDLDIRECT in the last 72 hours. Thyroid Function Tests: No results for input(s): TSH, T4TOTAL, FREET4, T3FREE, THYROIDAB in the last 72 hours. Anemia Panel: No results for input(s): VITAMINB12, FOLATE, FERRITIN, TIBC, IRON, RETICCTPCT in the last 72 hours. Sepsis Labs: No results for input(s): PROCALCITON, LATICACIDVEN in the last 168 hours.  No results found for this or any previous visit (from the past 240 hour(s)).       Radiology Studies: No results found.      Scheduled Meds:  vitamin C  500 mg Per Tube Daily   chlorhexidine  15 mL Mouth Rinse BID   Chlorhexidine Gluconate Cloth  6 each Topical Daily   enoxaparin (LOVENOX) injection  40 mg Subcutaneous Q24H   free water  30 mL Per Tube Q4H   insulin aspart  0-9 Units Subcutaneous Q4H   mouth rinse  15 mL Mouth Rinse q12n4p   metoprolol  tartrate  12.5 mg Per Tube BID   thiothixene  2 mg Per Tube BID   Continuous Infusions:  feeding supplement (OSMOLITE 1.2 CAL) 1,000 mL (03/05/21 1011)     LOS: 26 days    Time spent: 22 minutes     Marrion Coy, MD [facility]   To contact the attending provider between 7A-7P or the covering provider during after hours 7P-7A, please log into the web site [email or web address] and access using universal [place] password for that web site. If you do not have the password, please call the hospital operator.  03/05/2021, 1:31 PM

## 2021-03-06 DIAGNOSIS — U071 COVID-19: Secondary | ICD-10-CM | POA: Diagnosis not present

## 2021-03-06 DIAGNOSIS — F061 Catatonic disorder due to known physiological condition: Secondary | ICD-10-CM | POA: Diagnosis not present

## 2021-03-06 DIAGNOSIS — N179 Acute kidney failure, unspecified: Secondary | ICD-10-CM | POA: Diagnosis not present

## 2021-03-06 DIAGNOSIS — R627 Adult failure to thrive: Secondary | ICD-10-CM | POA: Diagnosis not present

## 2021-03-06 LAB — GLUCOSE, CAPILLARY
Glucose-Capillary: 156 mg/dL — ABNORMAL HIGH (ref 70–99)
Glucose-Capillary: 188 mg/dL — ABNORMAL HIGH (ref 70–99)
Glucose-Capillary: 228 mg/dL — ABNORMAL HIGH (ref 70–99)
Glucose-Capillary: 234 mg/dL — ABNORMAL HIGH (ref 70–99)
Glucose-Capillary: 318 mg/dL — ABNORMAL HIGH (ref 70–99)

## 2021-03-06 NOTE — Plan of Care (Signed)
  Problem: Education: Goal: Knowledge of General Education information will improve Description: Including pain rating scale, medication(s)/side effects and non-pharmacologic comfort measures Outcome: Progressing   Problem: Health Behavior/Discharge Planning: Goal: Ability to manage health-related needs will improve Outcome: Progressing   Problem: Clinical Measurements: Goal: Ability to maintain clinical measurements within normal limits will improve Outcome: Progressing Goal: Will remain free from infection Outcome: Progressing Goal: Diagnostic test results will improve Outcome: Progressing Goal: Respiratory complications will improve Outcome: Progressing Goal: Cardiovascular complication will be avoided Outcome: Progressing   Problem: Activity: Goal: Risk for activity intolerance will decrease Outcome: Progressing   Problem: Nutrition: Goal: Adequate nutrition will be maintained Outcome: Progressing   Problem: Coping: Goal: Level of anxiety will decrease Outcome: Progressing   Problem: Elimination: Goal: Will not experience complications related to bowel motility Outcome: Progressing Goal: Will not experience complications related to urinary retention Outcome: Progressing   Problem: Pain Managment: Goal: General experience of comfort will improve Outcome: Progressing   Problem: Safety: Goal: Ability to remain free from injury will improve Outcome: Progressing   Problem: Skin Integrity: Goal: Risk for impaired skin integrity will decrease Outcome: Progressing   Problem: Respiratory: Goal: Will maintain a patent airway Outcome: Progressing Goal: Complications related to the disease process, condition or treatment will be avoided or minimized Outcome: Progressing   

## 2021-03-06 NOTE — Progress Notes (Signed)
Pt ate 50% of dinner meal tray- including a magic cup 2 nectar thick teas meat 50%,potatoes 50% peas 25%

## 2021-03-06 NOTE — Evaluation (Signed)
Clinical/Bedside Swallow Evaluation Patient Details  Name: Erica Mueller MRN: [id number] Date of Birth: 1950-07-01  Today's Date: 03/06/2021 Time: SLP Start Time (ACUTE ONLY): 1505 SLP Stop Time (ACUTE ONLY): 1600 SLP Time Calculation (min) (ACUTE ONLY): 55 min  Past Medical History:  Past Medical History:  Diagnosis Date   Coronary artery disease    Depression    Diabetes mellitus without complication (HCC)    Elevated lipids    GERD (gastroesophageal reflux disease)    Hypertension    Schizophrenia (HCC)    Syphilis (acquired)    Past Surgical History:  Past Surgical History:  Procedure Laterality Date   COLONOSCOPY     COLONOSCOPY WITH PROPOFOL N/A 09/01/2015   Procedure: COLONOSCOPY WITH PROPOFOL;  Surgeon: Wallace Cullens, MD;  Location: [facility] ENDOSCOPY;  Service: Gastroenterology;  Laterality: N/A;   IR FLUORO RM 30-60 MIN  02/23/2021   IR GASTROSTOMY TUBE MOD SED  02/24/2021   IR REPLC GASTRO/COLONIC TUBE PERCUT W/FLUORO  02/27/2021   TUBAL LIGATION     wisdom teeth removal     HPI:  Erica Mueller is a 70 y.o. F with CAD, DM, HTN, dementia, and schizophrenia, lives in [place] since May 2022 UTI sepsis who presented with acute somnolence for 1 day. Per family, she tested positive for COVID "2 weeks ago" and has steadily declined since then. Chest CT revealed emphysema with partial consolidations in the right upper lobe and  right lower lobe concerning for pneumonia and or aspiration.  Addendum: pt has had a PEG placed and is currently on TFs for support. Psychiatry is followed for ECT.    Assessment / Plan / Recommendation  Clinical Impression  ST services completed a re-assessment, BSE, today per MD order. Pt has been receiving ECT txs, followed by Psychiatry, and now exhibits improved Cognitive alertness and engagement w/ others. She has been tolerating PEG TFs since placement this admit.  Pt appears to present w/ oropharyngeal phase dysphagia in light of lengthy illness and declined  Cognitive status; Baseline Dementia w/ psychiatric illness also. This can impact her overall awareness/timing of swallow and safety during po tasks which increases risk for pharyngeal phase deficits including aspiration, choking. She exhibits Poor Insight and Awareness of her deficits overall. Pt's risk for aspiration is present but can be reduced when following general aspiration precautions, given feeding assistance, and when using a modified diet consistency of Puree foods and Nectar consistency liquids.   Pt demonstrated oral attention but could not follow basic instructions during the oral care. She then wanted to hold Cup for drinking. Pt required MOD verbal/tactile/visual cues for during po intake. She was responsive to stim and engaged in po tasks, even feeding herself. Pt consumed trials of ice chips, purees, and Nectar liquids w/ No immediate, overt clinical s/s of aspiration noted. No overt coughing or decline in respiratory status during/post trials. She did exhibit oral prep and awareness deficits requiring Mod visual/verbal cues presenting the utensil at lips for stim at mouth, but also just giving Time for readiness. With trials of thin liquids however, she demonstrated overt, clinical s/s of aspiration w/ the thin liquids via Cup and Straw. Bolus disorganization orally w/ multiple, audible swallows followed by coughing and throat clearing occurred during thin liquid trials.  Oral phase c/b increased oral phase time for bolus management and oral clearing. Noted lingual rolling/chewing and protrusion when opening mouth to accept boluses, and during swallowing. Increased Time needed for these lingual movements to complete A-P transfer of  bolus material for swallowing, then oral clearing. Multiple swallowing noted intermittently. This presentation can impede safe oral phase management and oral intake w/ increased textured foods.  OM exam was limited and cursory but revealed no unilateral weakness;  lingual bunching++. Pt fed self w/ setup and cues; noted min impulsive drinking behaviors d/t reduced Cognitive awareness overall.   Pt appears to exhibit Cognitive alertness for oral intake though she remains at risk for aspiration.    Recommend continue w/ PEG TFs for primary nutrition/hydration and a dysphagia diet level 1 (puree) w/ Nectar liquids for Pleasure and to promote oral intake and return to an oral diet fully in the future. Recommend aspiration precautions; recommend REFLUX precautions, and tray setup and support during the meal monitoring for Impulsive behaviors at meals. Pills CRUSHED in Puree for safer swallowing d/t Cognitive decline. NSG updated.  SLP Visit Diagnosis: Dysphagia, oropharyngeal phase (R13.12) (Cognitive decline)    Aspiration Risk  Mild aspiration risk;Moderate aspiration risk;Risk for inadequate nutrition/hydration    Diet Recommendation   PEG TFs for primary nutrition/hydration and a dysphagia diet level 1 (puree) w/ Nectar liquids for Pleasure and to promote oral intake and return to an oral diet fully in the future. Recommend aspiration precautions; recommend REFLUX precautions  Medication Administration: Crushed with puree    Other  Recommendations Recommended Consults:  (Dietician f/u) Oral Care Recommendations: Oral care BID;Oral care before and after PO;Staff/trained caregiver to provide oral care Other Recommendations: Order thickener from pharmacy;Prohibited food (jello, ice cream, thin soups);Remove water pitcher;Have oral suction available    Recommendations for follow up therapy are one component of a multi-disciplinary discharge planning process, led by the attending physician.  Recommendations may be updated based on patient status, additional functional criteria and insurance authorization.  Follow up Recommendations Skilled Nursing facility      Frequency and Duration min 1 x/week  1 week       Prognosis Prognosis for Safe Diet  Advancement: Guarded Barriers to Reach Goals: Cognitive deficits;Language deficits;Time post onset;Severity of deficits;Behavior Barriers/Prognosis Comment: continue PEG TFs      Swallow Study   General Date of Onset: 02/07/21 HPI: Mrs. Varble is a 70 y.o. F with CAD, DM, HTN, dementia, and schizophrenia, lives in [place] since May 2022 UTI sepsis who presented with acute somnolence for 1 day. Per family, she tested positive for COVID "2 weeks ago" and has steadily declined since then. Chest CT revealed emphysema with partial consolidations in the right upper lobe and  right lower lobe concerning for pneumonia and or aspiration.  Addendum: pt has had a PEG placed and is currently on TFs for support. Psychiatry is followed for ECT. Type of Study: Bedside Swallow Evaluation (re-assessment per MD order) Previous Swallow Assessment: bse on 02/11/2021 Diet Prior to this Study: NPO;NG Tube Temperature Spikes Noted: No Respiratory Status: Room air History of Recent Intubation: No Behavior/Cognition: Alert;Cooperative;Pleasant mood;Confused;Distractible;Requires cueing (lingual rolling and chewing - min) Oral Cavity Assessment:  (could not assess fully d/t sucking on tongue blade and swab;  missing Dentition/poor condition anteriorly) Oral Care Completed by SLP: Yes (attempted) Oral Cavity - Dentition: Poor condition;Missing dentition Vision: Functional for self-feeding Self-Feeding Abilities: Able to feed self;Needs assist;Needs set up;Total assist (held Cup) Patient Positioning: Upright in bed (needed positioning) Baseline Vocal Quality: Low vocal intensity (adequate) Volitional Cough: Cognitively unable to elicit Volitional Swallow: Unable to elicit    Oral/Motor/Sensory Function Overall Oral Motor/Sensory Function: Generalized oral weakness (min lingual rolling/chewing and protrusion) Facial Symmetry: Within Functional Limits  Lingual ROM: Within Functional Limits Lingual Symmetry: Within Functional  Limits Lingual Strength: Within Functional Limits   Ice Chips Ice chips: Impaired (oral prep/control) Presentation: Spoon (4 trials) Oral Phase Functional Implications: Prolonged oral transit   Thin Liquid Thin Liquid: Impaired Presentation: Cup;Self Fed;Straw (2 trials each; supported) Oral Phase Impairments: Poor awareness of bolus;Reduced lingual movement/coordination (lingual rolling/chewing and protrusion) Pharyngeal  Phase Impairments: Cough - Immediate;Cough - Delayed (3/4 trials)    Nectar Thick Nectar Thick Liquid: Impaired (but improved) Presentation: Straw;Self Fed (~4 ozs; supported) Oral Phase Impairments: Reduced lingual movement/coordination (lingual rolling/chewing; protrusion) Oral phase functional implications: Prolonged oral transit (min) Pharyngeal Phase Impairments:  (None)   Honey Thick     Puree Puree: Impaired Presentation: Spoon (fed; 10+ trials) Oral Phase Impairments: Reduced lingual movement/coordination (lingual rolling/chewing; protrusion) Oral Phase Functional Implications: Prolonged oral transit Pharyngeal Phase Impairments:  (None)   Solid     Solid: Not tested       Jerilynn Som, MS, CCC-SLP Speech Language Pathologist Rehab Services [phone] [facility] 03/06/2021,4:09 PM

## 2021-03-06 NOTE — Progress Notes (Signed)
PROGRESS NOTE    Erica Mueller  WUJ:[id number] DOB: [birth year] DOA: 02/07/2021 PCP: [name]    Brief Narrative:  Patient admitted 02/07/2021 with severe sepsis COVID-19 infection.  Past medical history of schizophrenia, hypertension, GERD, type 2 diabetes mellitus and depression and CAD.  The patient had a prolonged hospital course and never regained mental status.  She was treated for electrolyte abnormalities including hypernatremia, hypokalemia and hypophosphatemia.  She had an NG tube for 2 weeks and then a PEG tube was placed on 02/24/2021.  There was a PEG tube malfunction 9/19. IR was able to change over the guidewire.  Seen by psychiatry and restarted on psychiatric medications but still catatonic.  Psychiatry started ECT treatment.   Assessment & Plan:   Principal Problem:   Catatonia Active Problems:   Type 2 diabetes mellitus with hyperglycemia, without long-term current use of insulin (HCC)   Essential hypertension   Schizophrenia (HCC)   Sepsis (HCC)   Pressure injury of skin   Protein-calorie malnutrition, severe   Failure to thrive in adult   Hypernatremia   Hypokalemia   AKI (acute kidney injury) (HCC)   COVID-19 virus infection   PEG tube malfunction (HCC)   Hypophosphatemia  Schizophrenia with catatonia. Patient was able to speak more sentence today.  She feels thirsty, wants to drink water.  Currently he is on tube feeding.  I will obtain speech therapy evaluation.  COVID infection. Severe sepsis secondary to aspiration pneumonia. Aspiration pneumonia. Improved.  Failure to thrive. Severe protein calorie malnutrition. Continue tube feeding.   DVT prophylaxis: Lovenox Code Status: full Family Communication:  Disposition Plan:      Status is: Inpatient   Remains inpatient appropriate because:Ongoing diagnostic testing needed not appropriate for outpatient work up and Inpatient level of care appropriate due to severity of illness    Dispo: The patient is from: Home              Anticipated d/c is to: Home              Patient currently is not medically stable to d/c.              Difficult to place patient No       I/O last 3 completed shifts: In: 4 [Other:60; NG/GT:1596] Out: -  No intake/output data recorded.     Consultants:  psych  Procedures: None  Antimicrobials: None  Subjective: Patient was able to talk a little bit more today, but still disoriented to time and place. She feels thirsty, wants a drink of water. Does not have abdominal pain or nausea vomiting. No dysuria hematuria. No short of breath or cough.  Objective: Vitals:   03/05/21 2301 03/06/21 0345 03/06/21 0449 03/06/21 0734  BP: (!) 136/49 (!) 139/56  (!) 138/48  Pulse: 78 79  80  Resp: 16 17  14   Temp: 98.2 F (36.8 C) (!) 97.5 F (36.4 C)  (!) 97.5 F (36.4 C)  TempSrc: Oral   Oral  SpO2: 100% 96%  98%  Weight:   45.3 kg   Height:        Intake/Output Summary (Last 24 hours) at 03/06/2021 1331 Last data filed at 03/06/2021 0011 Gross per 24 hour  Intake 964 ml  Output --  Net 964 ml   Filed Weights   03/04/21 0500 03/05/21 0500 03/06/21 0449  Weight: 47.1 kg 45.5 kg 45.3 kg    Examination:  General exam: Appears calm and comfortable  Respiratory  system: Clear to auscultation. Respiratory effort normal. Cardiovascular system: S1 & S2 heard, RRR. No JVD, murmurs, rubs, gallops or clicks. No pedal edema. Gastrointestinal system: Abdomen is nondistended, soft and nontender. No organomegaly or masses felt. Normal bowel sounds heard. Central nervous system: Alert and oriented x1. No focal neurological deficits. Extremities: Symmetric 5 x 5 power. Skin: No rashes, lesions or ulcers Psychiatry: Judgement and insight appear normal. Mood & affect appropriate.     Data Reviewed: I have personally reviewed following labs and imaging studies  CBC: No results for input(s): WBC, NEUTROABS, HGB, HCT, MCV, PLT in  the last 168 hours. Basic Metabolic Panel: Recent Labs  Lab 03/02/21 0447  NA 133*  K 4.1  CL 102  CO2 24  GLUCOSE 231*  BUN 18  CREATININE 0.41*  CALCIUM 8.8*  MG 1.8  PHOS 3.4   GFR: Estimated Creatinine Clearance: 46.8 mL/min (A) (by C-G formula based on SCr of 0.41 mg/dL (L)). Liver Function Tests: No results for input(s): AST, ALT, ALKPHOS, BILITOT, PROT, ALBUMIN in the last 168 hours. No results for input(s): LIPASE, AMYLASE in the last 168 hours. No results for input(s): AMMONIA in the last 168 hours. Coagulation Profile: No results for input(s): INR, PROTIME in the last 168 hours. Cardiac Enzymes: No results for input(s): CKTOTAL, CKMB, CKMBINDEX, TROPONINI in the last 168 hours. BNP (last 3 results) No results for input(s): PROBNP in the last 8760 hours. HbA1C: No results for input(s): HGBA1C in the last 72 hours. CBG: Recent Labs  Lab 03/05/21 1924 03/05/21 2330 03/06/21 0347 03/06/21 0807 03/06/21 1203  GLUCAP 224* 171* 156* 188* 228*   Lipid Profile: No results for input(s): CHOL, HDL, LDLCALC, TRIG, CHOLHDL, LDLDIRECT in the last 72 hours. Thyroid Function Tests: No results for input(s): TSH, T4TOTAL, FREET4, T3FREE, THYROIDAB in the last 72 hours. Anemia Panel: No results for input(s): VITAMINB12, FOLATE, FERRITIN, TIBC, IRON, RETICCTPCT in the last 72 hours. Sepsis Labs: No results for input(s): PROCALCITON, LATICACIDVEN in the last 168 hours.  No results found for this or any previous visit (from the past 240 hour(s)).       Radiology Studies: No results found.      Scheduled Meds:  vitamin C  500 mg Per Tube Daily   chlorhexidine  15 mL Mouth Rinse BID   Chlorhexidine Gluconate Cloth  6 each Topical Daily   enoxaparin (LOVENOX) injection  40 mg Subcutaneous Q24H   free water  30 mL Per Tube Q4H   insulin aspart  0-9 Units Subcutaneous Q4H   mouth rinse  15 mL Mouth Rinse q12n4p   metoprolol tartrate  12.5 mg Per Tube BID    thiothixene  2 mg Per Tube BID   Continuous Infusions:  feeding supplement (OSMOLITE 1.2 CAL) 1,000 mL (03/06/21 0606)     LOS: 27 days    Time spent: 24 minutes    Marrion Coy, MD [facility]   To contact the attending provider between 7A-7P or the covering provider during after hours 7P-7A, please log into the web site [email or web address] and access using universal [place] password for that web site. If you do not have the password, please call the hospital operator.  03/06/2021, 1:31 PM

## 2021-03-06 NOTE — Consult Note (Signed)
[name]   Reason for Consult: Follow-up consult 70 year old woman with schizophrenia currently catatonic and receiving ECT. Referring Physician: Chipper Herb Patient Identification: Erica Mueller MRN:  [id number] Principal Diagnosis: Catatonia Diagnosis:  Principal Problem:   Catatonia Active Problems:   Type 2 diabetes mellitus with hyperglycemia, without long-term current use of insulin (HCC)   Essential hypertension   Schizophrenia (HCC)   Sepsis (HCC)   Pressure injury of skin   Protein-calorie malnutrition, severe   Failure to thrive in adult   Hypernatremia   Hypokalemia   AKI (acute kidney injury) (HCC)   COVID-19 virus infection   PEG tube malfunction (HCC)   Hypophosphatemia   Total Time spent with patient: 30 minutes  Subjective:   Erica Mueller is a 70 y.o. female patient admitted with patient was admitted to the hospital with COVID sepsis and after recovering from that has continued with altered mental status.  HPI: Patient has received 2 electroconvulsive therapy treatments both of which were tolerated well.  Today I found her awake and actually turned her head and looked at me when I spoke her name.  She also responded to a couple of questions with appropriate affirmatives.  Did not speak much more than that.  Nevertheless that is better than how she was doing last time I saw her  Past Psychiatric History: Long history of schizophrenia or schizoaffective disorder  Risk to Self:   Risk to Others:   Prior Inpatient Therapy:   Prior Outpatient Therapy:    Past Medical History:  Past Medical History:  Diagnosis Date   Coronary artery disease    Depression    Diabetes mellitus without complication (HCC)    Elevated lipids    GERD (gastroesophageal reflux disease)    Hypertension    Schizophrenia (HCC)    Syphilis (acquired)     Past Surgical History:  Procedure Laterality Date   COLONOSCOPY     COLONOSCOPY WITH PROPOFOL N/A  09/01/2015   Procedure: COLONOSCOPY WITH PROPOFOL;  Surgeon: Wallace Cullens, MD;  Location: [facility] ENDOSCOPY;  Service: Gastroenterology;  Laterality: N/A;   IR FLUORO RM 30-60 MIN  02/23/2021   IR GASTROSTOMY TUBE MOD SED  02/24/2021   IR REPLC GASTRO/COLONIC TUBE PERCUT W/FLUORO  02/27/2021   TUBAL LIGATION     wisdom teeth removal     Family History:  Family History  Problem Relation Age of Onset   Cancer Mother    Alcoholism Mother    Bone cancer Father    Heart disease Father    Diabetes Maternal Grandmother    Suicidality Paternal Uncle    Breast cancer Neg Hx    Family Psychiatric  History: See previous.  Father killed himself. Social History:  Social History   Substance and Sexual Activity  Alcohol Use Yes   Alcohol/week: 3.0 standard drinks   Types: 2 Cans of beer, 1 Shots of liquor per week     Social History   Substance and Sexual Activity  Drug Use Not Currently   Types: Marijuana   Comment: PAST     Social History   Socioeconomic History   Marital status: Divorced    Spouse name: Not on file   Number of children: Not on file   Years of education: Not on file   Highest education level: Not on file  Occupational History   Not on file  Tobacco Use   Smoking status: Every Day    Packs/day: 1.00    Years:  48.00    Pack years: 48.00    Types: Cigarettes    Start date: 12/20/1969   Smokeless tobacco: Never  Vaping Use   Vaping Use: Never used  Substance and Sexual Activity   Alcohol use: Yes    Alcohol/week: 3.0 standard drinks    Types: 2 Cans of beer, 1 Shots of liquor per week   Drug use: Not Currently    Types: Marijuana    Comment: PAST    Sexual activity: Not Currently    Birth control/protection: None  Other Topics Concern   Not on file  Social History Narrative   Not on file   Social Determinants of Health   Financial Resource Strain: Not on file  Food Insecurity: Not on file  Transportation Needs: Not on file  Physical Activity: Not on file   Stress: Not on file  Social Connections: Not on file   Additional Social History:    Allergies:  No Known Allergies  Labs:  Results for orders placed or performed during the hospital encounter of 02/07/21 (from the past 48 hour(s))  Glucose, capillary     Status: Abnormal   Collection Time: 03/04/21  4:28 PM  Result Value Ref Range   Glucose-Capillary 210 (H) 70 - 99 mg/dL    Comment: Glucose reference range applies only to samples taken after fasting for at least 8 hours.  Glucose, capillary     Status: Abnormal   Collection Time: 03/04/21  8:20 PM  Result Value Ref Range   Glucose-Capillary 228 (H) 70 - 99 mg/dL    Comment: Glucose reference range applies only to samples taken after fasting for at least 8 hours.   Comment 1 Notify RN   Glucose, capillary     Status: Abnormal   Collection Time: 03/04/21 11:36 PM  Result Value Ref Range   Glucose-Capillary 179 (H) 70 - 99 mg/dL    Comment: Glucose reference range applies only to samples taken after fasting for at least 8 hours.  Glucose, capillary     Status: Abnormal   Collection Time: 03/05/21  4:03 AM  Result Value Ref Range   Glucose-Capillary 158 (H) 70 - 99 mg/dL    Comment: Glucose reference range applies only to samples taken after fasting for at least 8 hours.   Comment 1 Notify RN   Glucose, capillary     Status: Abnormal   Collection Time: 03/05/21  7:36 AM  Result Value Ref Range   Glucose-Capillary 165 (H) 70 - 99 mg/dL    Comment: Glucose reference range applies only to samples taken after fasting for at least 8 hours.  Glucose, capillary     Status: Abnormal   Collection Time: 03/05/21 11:20 AM  Result Value Ref Range   Glucose-Capillary 246 (H) 70 - 99 mg/dL    Comment: Glucose reference range applies only to samples taken after fasting for at least 8 hours.  Glucose, capillary     Status: Abnormal   Collection Time: 03/05/21  4:50 PM  Result Value Ref Range   Glucose-Capillary 199 (H) 70 - 99 mg/dL     Comment: Glucose reference range applies only to samples taken after fasting for at least 8 hours.  Glucose, capillary     Status: Abnormal   Collection Time: 03/05/21  7:24 PM  Result Value Ref Range   Glucose-Capillary 224 (H) 70 - 99 mg/dL    Comment: Glucose reference range applies only to samples taken after fasting for at least 8 hours.  Comment 1 Notify RN   Glucose, capillary     Status: Abnormal   Collection Time: 03/05/21 11:30 PM  Result Value Ref Range   Glucose-Capillary 171 (H) 70 - 99 mg/dL    Comment: Glucose reference range applies only to samples taken after fasting for at least 8 hours.   Comment 1 Notify RN   Glucose, capillary     Status: Abnormal   Collection Time: 03/06/21  3:47 AM  Result Value Ref Range   Glucose-Capillary 156 (H) 70 - 99 mg/dL    Comment: Glucose reference range applies only to samples taken after fasting for at least 8 hours.   Comment 1 Notify RN   Glucose, capillary     Status: Abnormal   Collection Time: 03/06/21  8:07 AM  Result Value Ref Range   Glucose-Capillary 188 (H) 70 - 99 mg/dL    Comment: Glucose reference range applies only to samples taken after fasting for at least 8 hours.  Glucose, capillary     Status: Abnormal   Collection Time: 03/06/21 12:03 PM  Result Value Ref Range   Glucose-Capillary 228 (H) 70 - 99 mg/dL    Comment: Glucose reference range applies only to samples taken after fasting for at least 8 hours.    Current Facility-Administered Medications  Medication Dose Route Frequency Provider Last Rate Last Admin   acetaminophen (TYLENOL) suppository 650 mg  650 mg Rectal Q4H PRN Jimmye Norman, NP       acetaminophen (TYLENOL) tablet 650 mg  650 mg Oral Q4H PRN Harlon Ditty D, NP       ascorbic acid (VITAMIN C) tablet 500 mg  500 mg Per Tube Daily Charise Killian, MD   500 mg at 03/06/21 3846   chlorhexidine (PERIDEX) 0.12 % solution 15 mL  15 mL Mouth Rinse BID Alberteen Sam, MD   15 mL  at 03/06/21 6599   Chlorhexidine Gluconate Cloth 2 % PADS 6 each  6 each Topical Daily Erin Fulling, MD   6 each at 03/06/21 1344   docusate sodium (COLACE) capsule 100 mg  100 mg Oral BID PRN Judithe Modest, NP       enoxaparin (LOVENOX) injection 40 mg  40 mg Subcutaneous Q24H Bari Mantis A, RPH   40 mg at 03/05/21 2037   feeding supplement (OSMOLITE 1.2 CAL) liquid 1,000 mL  1,000 mL Per Tube Continuous Alford Highland, MD 55 mL/hr at 03/06/21 0606 1,000 mL at 03/06/21 0606   free water 30 mL  30 mL Per Tube Q4H Alford Highland, MD   30 mL at 03/06/21 1344   insulin aspart (novoLOG) injection 0-9 Units  0-9 Units Subcutaneous Q4H Alford Highland, MD   3 Units at 03/06/21 1229   MEDLINE mouth rinse  15 mL Mouth Rinse q12n4p Danford, Earl Lites, MD   15 mL at 03/06/21 1230   metoprolol tartrate (LOPRESSOR) 25 mg/10 mL oral suspension 12.5 mg  12.5 mg Per Tube BID Alford Highland, MD   12.5 mg at 03/06/21 0904   morphine 2 MG/ML injection 2 mg  2 mg Intravenous Q4H PRN Jimmye Norman, NP       ondansetron [facility]) injection 4 mg  4 mg Intravenous Q6H PRN Judithe Modest, NP       polyethylene glycol (MIRALAX / GLYCOLAX) packet 17 g  17 g Oral Daily PRN Judithe Modest, NP       thiothixene (NAVANE) capsule 2 mg  2 mg Per  Tube BID Jerolyn Flenniken, Jackquline Denmark, MD   2 mg at 03/06/21 0907    Musculoskeletal: Strength & Muscle Tone: increased Gait & Station: unable to stand Patient leans: N/A            Psychiatric Specialty Exam:  Presentation  General Appearance:  No data recorded Eye Contact: No data recorded Speech: No data recorded Speech Volume: No data recorded Handedness: No data recorded  Mood and Affect  Mood: No data recorded Affect: No data recorded  Thought Process  Thought Processes: No data recorded Descriptions of Associations:No data recorded Orientation:No data recorded Thought Content:No data recorded History of  Schizophrenia/Schizoaffective disorder:No data recorded Duration of Psychotic Symptoms:No data recorded Hallucinations:No data recorded Ideas of Reference:No data recorded Suicidal Thoughts:No data recorded Homicidal Thoughts:No data recorded  Sensorium  Memory: No data recorded Judgment: No data recorded Insight: No data recorded  Executive Functions  Concentration: No data recorded Attention Span: No data recorded Recall: No data recorded Fund of Knowledge: No data recorded Language: No data recorded  Psychomotor Activity  Psychomotor Activity: No data recorded  Assets  Assets: No data recorded  Sleep  Sleep: No data recorded  Physical Exam: Physical Exam Vitals and nursing note reviewed.  Constitutional:      Appearance: Normal appearance.  HENT:     Head: Normocephalic and atraumatic.     Mouth/Throat:     Pharynx: Oropharynx is clear.  Eyes:     Pupils: Pupils are equal, round, and reactive to light.  Cardiovascular:     Rate and Rhythm: Normal rate and regular rhythm.  Pulmonary:     Effort: Pulmonary effort is normal.     Breath sounds: Normal breath sounds.  Abdominal:     General: Abdomen is flat.     Palpations: Abdomen is soft.  Musculoskeletal:        General: Normal range of motion.  Skin:    General: Skin is warm and dry.  Neurological:     General: No focal deficit present.     Mental Status: She is alert. Mental status is at baseline.  Psychiatric:        Mood and Affect: Affect is blunt.        Speech: Speech is delayed and slurred.        Cognition and Memory: Cognition is impaired.   Review of Systems  Unable to perform ROS: Psychiatric disorder  Blood pressure (!) 149/50, pulse 82, temperature (!) 97.3 F (36.3 C), resp. rate 15, height 5\' 2"  (1.575 m), weight 45.3 kg, SpO2 96 %. Body mass index is 18.27 kg/m.  Treatment Plan Summary: Plan progress is perhaps lower than 1 might like but I think there is definitely  progress.  It is unfortunate that circumstances conspired such that we were not able to have treatment today.  Next ECT scheduled for Wednesday.  I would anticipate continued improvement.  Disposition:  see above  Sunday, MD 03/06/2021 4:20 PM

## 2021-03-07 DIAGNOSIS — F061 Catatonic disorder due to known physiological condition: Secondary | ICD-10-CM | POA: Diagnosis not present

## 2021-03-07 LAB — GLUCOSE, CAPILLARY
Glucose-Capillary: 170 mg/dL — ABNORMAL HIGH (ref 70–99)
Glucose-Capillary: 194 mg/dL — ABNORMAL HIGH (ref 70–99)
Glucose-Capillary: 196 mg/dL — ABNORMAL HIGH (ref 70–99)
Glucose-Capillary: 202 mg/dL — ABNORMAL HIGH (ref 70–99)
Glucose-Capillary: 222 mg/dL — ABNORMAL HIGH (ref 70–99)
Glucose-Capillary: 222 mg/dL — ABNORMAL HIGH (ref 70–99)
Glucose-Capillary: 244 mg/dL — ABNORMAL HIGH (ref 70–99)

## 2021-03-07 MED ORDER — FREE WATER
60.0000 mL | Status: DC
  Administered 2021-03-07 – 2021-03-29 (×116): 60 mL

## 2021-03-07 MED ORDER — OSMOLITE 1.2 CAL PO LIQD
1000.0000 mL | ORAL | Status: DC
  Administered 2021-03-07 – 2021-03-10 (×2): 1000 mL

## 2021-03-07 MED ORDER — PROSOURCE TF PO LIQD
45.0000 mL | Freq: Every day | ORAL | Status: DC
  Administered 2021-03-07 – 2021-03-15 (×6): 45 mL
  Filled 2021-03-07 (×9): qty 45

## 2021-03-07 NOTE — Progress Notes (Signed)
PROGRESS NOTE    Erica Mueller  YYT:[id number] DOB: May 04, 1951 DOA: 02/07/2021 PCP: [name]    Brief Narrative:  Patient admitted 02/07/2021 with severe sepsis COVID-19 infection.  Past medical history of schizophrenia, hypertension, GERD, type 2 diabetes mellitus and depression and CAD.  The patient had a prolonged hospital course and never regained mental status.  She was treated for electrolyte abnormalities including hypernatremia, hypokalemia and hypophosphatemia.  She had an NG tube for 2 weeks and then a PEG tube was placed on 02/24/2021.  There was a PEG tube malfunction 9/19. IR was able to change over the guidewire.  Seen by psychiatry and restarted on psychiatric medications but still catatonic.  Psychiatry started ECT treatment.  9/27 continue to keep her eyes closed , does not move during my exam. No communication  Assessment & Plan:   Principal Problem:   Catatonia Active Problems:   Type 2 diabetes mellitus with hyperglycemia, without long-term current use of insulin (HCC)   Essential hypertension   Schizophrenia (HCC)   Sepsis (HCC)   Pressure injury of skin   Protein-calorie malnutrition, severe   Failure to thrive in adult   Hypernatremia   Hypokalemia   AKI (acute kidney injury) (HCC)   COVID-19 virus infection   PEG tube malfunction (HCC)   Hypophosphatemia  Schizophrenia with catatonia. No communication Psych input appreciated ECT in am   COVID infection. Severe sepsis secondary to aspiration pneumonia. Aspiration pneumonia. Improved  Failure to thrive. Severe protein calorie malnutrition. Continue TF per RD rec.   DVT prophylaxis: Lovenox Code Status: full Family Communication: None at bedside Disposition Plan:      Status is: Inpatient   Remains inpatient appropriate because:Ongoing diagnostic testing needed not appropriate for outpatient work up and Inpatient level of care appropriate due to severity of illness   Dispo: The  patient is from: Home              Anticipated d/c is to: Home              Patient currently is not medically stable to d/c.              Difficult to place patient No       I/O last 3 completed shifts: In: 2678.3 [P.O.:170; Other:120; NG/GT:2388.3] Out: -  No intake/output data recorded.     Consultants:  psych  Procedures: None  Antimicrobials: None  Subjective: Unable to assess as patient is noncommunicating with me  Objective: Vitals:   03/07/21 0346 03/07/21 0500 03/07/21 0733 03/07/21 1148  BP: (!) 139/47  (!) 113/50 (!) 135/53  Pulse: 76  65 71  Resp: 18  18 16   Temp: 98.4 F (36.9 C)  98.1 F (36.7 C) 98.3 F (36.8 C)  TempSrc:   Axillary Oral  SpO2: 99%  100% 98%  Weight:  45.7 kg    Height:        Intake/Output Summary (Last 24 hours) at 03/07/2021 1536 Last data filed at 03/07/2021 0424 Gross per 24 hour  Intake 1714.25 ml  Output --  Net 1714.25 ml   Filed Weights   03/05/21 0500 03/06/21 0449 03/07/21 0500  Weight: 45.5 kg 45.3 kg 45.7 kg    Examination: Calm, eyes closed, nad Cta anteriorly no w/r Regular s1/s2 no gallop Soft benign +bs No edema     Data Reviewed: I have personally reviewed following labs and imaging studies  CBC: No results for input(s): WBC, NEUTROABS, HGB, HCT, MCV, PLT  in the last 168 hours. Basic Metabolic Panel: Recent Labs  Lab 03/02/21 0447  NA 133*  K 4.1  CL 102  CO2 24  GLUCOSE 231*  BUN 18  CREATININE 0.41*  CALCIUM 8.8*  MG 1.8  PHOS 3.4   GFR: Estimated Creatinine Clearance: 47.2 mL/min (A) (by C-G formula based on SCr of 0.41 mg/dL (L)). Liver Function Tests: No results for input(s): AST, ALT, ALKPHOS, BILITOT, PROT, ALBUMIN in the last 168 hours. No results for input(s): LIPASE, AMYLASE in the last 168 hours. No results for input(s): AMMONIA in the last 168 hours. Coagulation Profile: No results for input(s): INR, PROTIME in the last 168 hours. Cardiac Enzymes: No results for  input(s): CKTOTAL, CKMB, CKMBINDEX, TROPONINI in the last 168 hours. BNP (last 3 results) No results for input(s): PROBNP in the last 8760 hours. HbA1C: No results for input(s): HGBA1C in the last 72 hours. CBG: Recent Labs  Lab 03/07/21 0008 03/07/21 0344 03/07/21 0732 03/07/21 0901 03/07/21 1147  GLUCAP 202* 170* 196* 194* 222*   Lipid Profile: No results for input(s): CHOL, HDL, LDLCALC, TRIG, CHOLHDL, LDLDIRECT in the last 72 hours. Thyroid Function Tests: No results for input(s): TSH, T4TOTAL, FREET4, T3FREE, THYROIDAB in the last 72 hours. Anemia Panel: No results for input(s): VITAMINB12, FOLATE, FERRITIN, TIBC, IRON, RETICCTPCT in the last 72 hours. Sepsis Labs: No results for input(s): PROCALCITON, LATICACIDVEN in the last 168 hours.  No results found for this or any previous visit (from the past 240 hour(s)).       Radiology Studies: No results found.      Scheduled Meds:  vitamin C  500 mg Per Tube Daily   chlorhexidine  15 mL Mouth Rinse BID   Chlorhexidine Gluconate Cloth  6 each Topical Daily   enoxaparin (LOVENOX) injection  40 mg Subcutaneous Q24H   feeding supplement (PROSource TF)  45 mL Per Tube Daily   free water  60 mL Per Tube Q4H   insulin aspart  0-9 Units Subcutaneous Q4H   mouth rinse  15 mL Mouth Rinse q12n4p   metoprolol tartrate  12.5 mg Per Tube BID   thiothixene  2 mg Per Tube BID   Continuous Infusions:  feeding supplement (OSMOLITE 1.2 CAL) 1,000 mL (03/07/21 0915)     LOS: 28 days    Time spent: 35 minutes with more than 50% on COC    Lynn Ito, MD [facility]   To contact the attending provider between 7A-7P or the covering provider during after hours 7P-7A, please log into the web site [email or web address] and access using universal [place] password for that web site. If you do not have the password, please call the hospital operator.  03/07/2021, 3:36 PM

## 2021-03-07 NOTE — Progress Notes (Signed)
Inpatient Diabetes Program Recommendations  AACE/ADA: New Consensus Statement on Inpatient Glycemic Control   Target Ranges:  Prepandial:   less than 140 mg/dL      Peak postprandial:   less than 180 mg/dL (1-2 hours)      Critically ill patients:  140 - 180 mg/dL  Results for JAIDON, SPONSEL (MRN [id number]) as of 03/07/2021 09:12  Ref. Range 03/07/2021 00:08 03/07/2021 03:44 03/07/2021 07:32 03/07/2021 09:01  Glucose-Capillary Latest Ref Range: 70 - 99 mg/dL 696 (H) 789 (H) 381 (H) 194 (H)  Results for LEYNA, VANDERKOLK (MRN [id number]) as of 03/07/2021 09:12  Ref. Range 03/06/2021 08:07 03/06/2021 12:03 03/06/2021 17:27 03/06/2021 19:32  Glucose-Capillary Latest Ref Range: 70 - 99 mg/dL 527 (H) 782 (H) 423 (H) 318 (H)    Review of Glycemic Control  Diabetes history: DM2 Outpatient Diabetes medications: Metformin 1000 mg BID, Amaryl 2 mg daily Current orders for Inpatient glycemic control: Novolog 0-9 units Q4H  Inpatient Diabetes Program Recommendations:    Insulin-Tube Feeding: Please consider ordering Novolog 2 units Q4H for tube feeding coverage. If tube feeding is stopped or held then Novolog tube feeding coverage should also be stopped or held.  Thanks, Orlando Penner, RN, MSN, CDE Diabetes Coordinator Inpatient Diabetes Program [phone] (Team Pager from 8am to 5pm)

## 2021-03-07 NOTE — Progress Notes (Signed)
Nutrition Follow-up  DOCUMENTATION CODES:  Severe malnutrition in context of social or environmental circumstances  INTERVENTION:  Continue diet as ordered per SLP, advance as tolerated Nursing staff to assist with meal set-up and feeding Recommend continuing enteral feeds via PEG. Recommend the following to encourage oral intake. Monitor for ability to adjust to nocturnal feeds once pt has completed ECT treatments: Osmolite 1.2 @ 29m/h (1.08L/d) and 1 packet of prosource (40kcal and 11g of protein) 618mfree water q4h Regimen will provide 1336 kcal, 71g of protein, and 124618mf free water (TF+flush)  NUTRITION DIAGNOSIS:  Severe Malnutrition related to social / environmental circumstances as evidenced by severe fat depletion, severe muscle depletion. -ongoing  GOAL:  Patient will meet greater than or equal to 90% of their needs - met with TF  MONITOR:  Labs, Weight trends, TF tolerance, Skin, I & O's  REASON FOR ASSESSMENT:  Malnutrition Screening Tool    ASSESSMENT:  [age]o. female with medical history of HTN, DM, schizophrenia, depression, CAD, GERD, recent COVID 19 40d HLD who is admitted with aspiration PNA and sepsis.  9/16 PEG placed 9/20 g-tube exchanged due to malfunction 9/21 ECT initiated  9/26 diet advanced per SLP  Pt had ECT initiated last week, has received two treatments so far with positive results. Psychiatry plans for more treatments. SLP able to work with pt yesterday and diet was advanced to DYS 1 with nectar thick liquids. Good intake of dinner per RN report.   As pt ate well, would be comfortable adjusting TF to meet 75% of pt's needs. Will closely monitor oral intake for continued adequacy. If pt continues to consume ~50% of meals, could adjust to nocturnal feeds. Would not recommend discontinuing continuous feeds until after pt completes ECT as she must be NPO for the majority of the day and weight loss has occurred this admission.  Glucose noted to  be high overnight/yesterday not currently on a carb modified diet but insulin is in place. Likely due to oral intake as glucose was stable and TF prior to oral diet.  Estimated nutrition intake from 9/26 dinner per RN report: 610kcal and 21g of protein   Nutritionally Relevant Medications: Scheduled Meds:  vitamin C  500 mg Per Tube Daily   free water  30 mL Per Tube Q4H   insulin aspart  0-9 Units Subcutaneous Q4H   Continuous Infusions:  feeding supplement (OSMOLITE 1.2 CAL) 1,000 mL (03/07/21 0023)   PRN Meds: docusate sodium, ondansetron, polyethylene glycol  Labs Reviewed: SBG ranges from 170-318 mg/dL over the last 24 hours HgbA1c: 7/9% (8/30)  NUTRITION - FOCUSED PHYSICAL EXAM: Flowsheet Row Most Recent Value  Orbital Region Moderate depletion  Upper Arm Region Severe depletion  Thoracic and Lumbar Region Severe depletion  Buccal Region Moderate depletion  Temple Region Moderate depletion  Clavicle Bone Region Moderate depletion  Clavicle and Acromion Bone Region Severe depletion  Scapular Bone Region Severe depletion  Dorsal Hand Moderate depletion  Patellar Region Severe depletion  Anterior Thigh Region Severe depletion  Posterior Calf Region Moderate depletion  Edema (RD Assessment) None  Hair Reviewed  Eyes Reviewed  Mouth Reviewed  Skin Reviewed  Nails Reviewed   Diet Order:   Diet Order             DIET - DYS 1 Room service appropriate? Yes with Assist; Fluid consistency: Nectar Thick  Diet effective now  EDUCATION NEEDS:  No education needs have been identified at this time  Skin:  Skin Assessment: Skin Integrity Issues: Skin Integrity Issues:: Stage II Stage II: sacrum  Last BM:  9/24  Height:  Ht Readings from Last 1 Encounters:  03/03/21 5' 2"  (1.575 m)   Weight:  Wt Readings from Last 1 Encounters:  03/07/21 45.7 kg   Ideal Body Weight:  50 kg  BMI:  Body mass index is 18.43 kg/m.  Estimated Nutritional Needs:   Kcal:  1500-1700 kcal/d Protein:  75-85 g/d Fluid:  1.2-1.4L/day   Ranell Patrick, RD, LDN Clinical Dietitian Pager on Amion

## 2021-03-08 ENCOUNTER — Other Ambulatory Visit: Payer: Self-pay | Admitting: Psychiatry

## 2021-03-08 ENCOUNTER — Inpatient Hospital Stay (HOSPITAL_COMMUNITY): Admit: 2021-03-08 | Discharge: 2021-03-08 | Disposition: A | Discharge status: Home / Self Care | Payer: Medicare Other

## 2021-03-08 ENCOUNTER — Inpatient Hospital Stay: Payer: Medicare Other | Admitting: Anesthesiology

## 2021-03-08 DIAGNOSIS — F061 Catatonic disorder due to known physiological condition: Secondary | ICD-10-CM

## 2021-03-08 DIAGNOSIS — F250 Schizoaffective disorder, bipolar type: Secondary | ICD-10-CM

## 2021-03-08 LAB — GLUCOSE, CAPILLARY
Glucose-Capillary: 133 mg/dL — ABNORMAL HIGH (ref 70–99)
Glucose-Capillary: 155 mg/dL — ABNORMAL HIGH (ref 70–99)
Glucose-Capillary: 158 mg/dL — ABNORMAL HIGH (ref 70–99)
Glucose-Capillary: 159 mg/dL — ABNORMAL HIGH (ref 70–99)
Glucose-Capillary: 111 mg/dL — ABNORMAL HIGH (ref 70–99)
Glucose-Capillary: 167 mg/dL — ABNORMAL HIGH (ref 70–99)
Glucose-Capillary: 183 mg/dL — ABNORMAL HIGH (ref 70–99)

## 2021-03-08 MED ORDER — METHOHEXITAL SODIUM 0.5 G IJ SOLR
INTRAMUSCULAR | Status: AC
  Filled 2021-03-08: qty 500

## 2021-03-08 MED ORDER — SUCCINYLCHOLINE CHLORIDE 200 MG/10ML IV SOSY
PREFILLED_SYRINGE | INTRAVENOUS | Status: DC | PRN
  Administered 2021-03-08: 70 mg via INTRAVENOUS

## 2021-03-08 MED ORDER — METHOHEXITAL SODIUM 100 MG/10ML IV SOSY
PREFILLED_SYRINGE | INTRAVENOUS | Status: DC | PRN
  Administered 2021-03-08: 50 mg via INTRAVENOUS

## 2021-03-08 NOTE — Progress Notes (Addendum)
SLP Cancellation Note  Patient Details Name: RITHA SAMPEDRO MRN: [id number] DOB: 03/30/1951   Cancelled treatment:       Reason Eval/Treat Not Completed:  (chart reviewed). Per chart review and MD notes, pt continues on full PEG TFs for nutrition/hydration; the dysphagia diet is more for Pleasure. No reports of aspiration have been noted/reported. NSG reported pt at ~50% of a dinner meal w/ Nectar liquids w/out swallowing deficits reported. On RA; afebrile. Pt requires support w/ po intake d/t Cognitive decline.    Due to ECT txs ongoing, pt is required to be NPO on tx days. Due to pt's schedule and overall presentation w/ Cognitive decline, recommend continue w/ current diet plan of Primary PEG TFs for needs (as per Dietician) and Dysphagia diet of level 1(PUREE) w/ Nectar liquids for Pleasure; general aspiration precautions and support/Supervision at meals.  Pt can f/u w/ ST services for Dysphagia tx at next venue of care in hopes to continue to safely upgrade her oral diet to least restrictive consistency -- this can be done most appropriately when pt has completed ECT txs, has improved Cognitively, and is back in a structured, known setting. NSG/CM/MD updated     Jerilynn Som, MS, CCC-SLP Speech Language Pathologist Rehab Services [phone] [facility] 03/08/2021, 3:01 PM

## 2021-03-08 NOTE — Addendum Note (Signed)
Addendum  created 03/08/21 1432 by Foye Deer, MD   Attestation recorded in Intraprocedure, Intraprocedure Attestations filed, Intraprocedure Staff edited

## 2021-03-08 NOTE — Transfer of Care (Signed)
Immediate Anesthesia Transfer of Care Note  Patient: Erica Mueller  Procedure(s) Performed: ECT TX  Patient Location: PACU  Anesthesia Type:General  Level of Consciousness: sedated  Airway & Oxygen Therapy: Patient Spontanous Breathing and Patient connected to face mask oxygen  Post-op Assessment: Report given to RN and Post -op Vital signs reviewed and stable  Post vital signs: Reviewed  Last Vitals:  Vitals Value Taken Time  BP 151/63 03/08/21 1207  Temp 36.9 C 03/08/21 1207  Pulse 87 03/08/21 1222  Resp 25 03/08/21 1222  SpO2 100 % 03/08/21 1222  Vitals shown include unvalidated device data.  Last Pain:  Vitals:   03/08/21 1210  TempSrc:   PainSc: 0-No pain         Complications: No notable events documented.

## 2021-03-08 NOTE — Procedures (Signed)
ECT SERVICES Physician's Interval Evaluation & Treatment Note 3  Patient Identification: Erica Mueller MRN:  [id number] Date of Evaluation:  03/08/2021 TX #: 3  MADRS:   MMSE:   P.E. Findings:  Patient remains withdrawn with significant weight loss.  Some skin lesions.  Psychiatric Interval Note:  Has become more verbal in between treatments.  Still very far off baseline  Subjective:  Patient is a 70 y.o. female seen for evaluation for Electroconvulsive Therapy. Not able to offer information  Treatment Summary:   []   Right Unilateral             [x]  Bilateral   % Energy : 1.0 ms 60%   Impedance: 1840 ohms  Seizure Energy Index: No reading obtained  Postictal Suppression Index: No reading was obtained but it looked quite good  Seizure Concordance Index: 59% although I suspect this is unreliable due to some problems with the leads  Medications  Pre Shock: Toradol 30 mg Brevital 50 mg succinylcholine 70 mg  Post Shock:    Seizure Duration: 60 seconds EMG 60 seconds EEG   Comments: Next treatment Friday.  Lungs:  [x]   Clear to auscultation               []  Other:   Heart:    [x]   Regular rhythm             []  irregular rhythm    [x]   Previous H&P reviewed, patient examined and there are NO CHANGES                 []   Previous H&P reviewed, patient examined and there are changes noted.   , MD 9/28/20226:14 PM

## 2021-03-08 NOTE — Anesthesia Postprocedure Evaluation (Signed)
Anesthesia Post Note  Patient: Erica Mueller  Procedure(s) Performed: ECT TX  Patient location during evaluation: PACU Anesthesia Type: General Level of consciousness: awake and alert Pain management: pain level controlled Vital Signs Assessment: post-procedure vital signs reviewed and stable Respiratory status: spontaneous breathing, nonlabored ventilation, respiratory function stable and patient connected to nasal cannula oxygen Cardiovascular status: blood pressure returned to baseline and stable Postop Assessment: no apparent nausea or vomiting Anesthetic complications: no   No notable events documented.   Last Vitals:  Vitals:   03/08/21 1222 03/08/21 1232  BP: (!) 161/81 (!) 187/71  Pulse: 87 92  Resp: (!) 25 15  Temp: 36.5 C   SpO2: 100% 97%    Last Pain:  Vitals:   03/08/21 1222  TempSrc:   PainSc: 0-No pain                 [name]

## 2021-03-08 NOTE — H&P (Signed)
Erica Mueller is an 70 y.o. female.   Chief Complaint: Patient remains mostly nonverbal unable to offer a specific complaint HPI: History of schizophrenia or schizoaffective disorder currently in an extended catatonic phase  Past Medical History:  Diagnosis Date   Coronary artery disease    Depression    Diabetes mellitus without complication (HCC)    Elevated lipids    GERD (gastroesophageal reflux disease)    Hypertension    Schizophrenia (HCC)    Syphilis (acquired)     Past Surgical History:  Procedure Laterality Date   COLONOSCOPY     COLONOSCOPY WITH PROPOFOL N/A 09/01/2015   Procedure: COLONOSCOPY WITH PROPOFOL;  Surgeon: Wallace Cullens, MD;  Location: [facility] ENDOSCOPY;  Service: Gastroenterology;  Laterality: N/A;   IR FLUORO RM 30-60 MIN  02/23/2021   IR GASTROSTOMY TUBE MOD SED  02/24/2021   IR REPLC GASTRO/COLONIC TUBE PERCUT W/FLUORO  02/27/2021   TUBAL LIGATION     wisdom teeth removal      Family History  Problem Relation Age of Onset   Cancer Mother    Alcoholism Mother    Bone cancer Father    Heart disease Father    Diabetes Maternal Grandmother    Suicidality Paternal Uncle    Breast cancer Neg Hx    Social History:  reports that she has been smoking cigarettes. She started smoking about 51 years ago. She has a 48.00 pack-year smoking history. She has never used smokeless tobacco. She reports current alcohol use of about 3.0 standard drinks per week. She reports that she does not currently use drugs after having used the following drugs: Marijuana.  Allergies: No Known Allergies  (Not in a hospital admission)   Results for orders placed or performed during the hospital encounter of 02/07/21 (from the past 48 hour(s))  Glucose, capillary     Status: Abnormal   Collection Time: 03/06/21  7:32 PM  Result Value Ref Range   Glucose-Capillary 318 (H) 70 - 99 mg/dL    Comment: Glucose reference range applies only to samples taken after fasting for at least 8 hours.    Comment 1 Notify RN    Comment 2 Document in Chart   Glucose, capillary     Status: Abnormal   Collection Time: 03/07/21 12:08 AM  Result Value Ref Range   Glucose-Capillary 202 (H) 70 - 99 mg/dL    Comment: Glucose reference range applies only to samples taken after fasting for at least 8 hours.   Comment 1 Notify RN    Comment 2 Document in Chart   Glucose, capillary     Status: Abnormal   Collection Time: 03/07/21  3:44 AM  Result Value Ref Range   Glucose-Capillary 170 (H) 70 - 99 mg/dL    Comment: Glucose reference range applies only to samples taken after fasting for at least 8 hours.   Comment 1 Notify RN    Comment 2 Document in Chart   Glucose, capillary     Status: Abnormal   Collection Time: 03/07/21  7:32 AM  Result Value Ref Range   Glucose-Capillary 196 (H) 70 - 99 mg/dL    Comment: Glucose reference range applies only to samples taken after fasting for at least 8 hours.  Glucose, capillary     Status: Abnormal   Collection Time: 03/07/21  9:01 AM  Result Value Ref Range   Glucose-Capillary 194 (H) 70 - 99 mg/dL    Comment: Glucose reference range applies only to samples  taken after fasting for at least 8 hours.  Glucose, capillary     Status: Abnormal   Collection Time: 03/07/21 11:47 AM  Result Value Ref Range   Glucose-Capillary 222 (H) 70 - 99 mg/dL    Comment: Glucose reference range applies only to samples taken after fasting for at least 8 hours.  Glucose, capillary     Status: Abnormal   Collection Time: 03/07/21  4:16 PM  Result Value Ref Range   Glucose-Capillary 244 (H) 70 - 99 mg/dL    Comment: Glucose reference range applies only to samples taken after fasting for at least 8 hours.  Glucose, capillary     Status: Abnormal   Collection Time: 03/07/21  8:59 PM  Result Value Ref Range   Glucose-Capillary 222 (H) 70 - 99 mg/dL    Comment: Glucose reference range applies only to samples taken after fasting for at least 8 hours.   Comment 1 Notify RN     Comment 2 Document in Chart   Glucose, capillary     Status: Abnormal   Collection Time: 03/07/21 11:52 PM  Result Value Ref Range   Glucose-Capillary 158 (H) 70 - 99 mg/dL    Comment: Glucose reference range applies only to samples taken after fasting for at least 8 hours.   Comment 1 Notify RN    Comment 2 Document in Chart   Glucose, capillary     Status: Abnormal   Collection Time: 03/08/21  3:45 AM  Result Value Ref Range   Glucose-Capillary 133 (H) 70 - 99 mg/dL    Comment: Glucose reference range applies only to samples taken after fasting for at least 8 hours.   Comment 1 Notify RN    Comment 2 Document in Chart   Glucose, capillary     Status: Abnormal   Collection Time: 03/08/21  8:06 AM  Result Value Ref Range   Glucose-Capillary 155 (H) 70 - 99 mg/dL    Comment: Glucose reference range applies only to samples taken after fasting for at least 8 hours.  Glucose, capillary     Status: Abnormal   Collection Time: 03/08/21  1:21 PM  Result Value Ref Range   Glucose-Capillary 159 (H) 70 - 99 mg/dL    Comment: Glucose reference range applies only to samples taken after fasting for at least 8 hours.  Glucose, capillary     Status: Abnormal   Collection Time: 03/08/21  4:50 PM  Result Value Ref Range   Glucose-Capillary 111 (H) 70 - 99 mg/dL    Comment: Glucose reference range applies only to samples taken after fasting for at least 8 hours.   No results found.  Review of Systems  Unable to perform ROS: Psychiatric disorder  Constitutional:  Negative for activity change.  Skin: Negative.   Neurological: Negative.    Blood pressure (!) 162/70, pulse 91, temperature 97.7 F (36.5 C), resp. rate (!) 29, height 5\' 2"  (1.575 m), weight 45.7 kg, SpO2 96 %. Physical Exam Vitals and nursing note reviewed.  Constitutional:      Appearance: She is well-developed. She is ill-appearing.  HENT:     Head: Normocephalic and atraumatic.  Eyes:     Conjunctiva/sclera:  Conjunctivae normal.     Pupils: Pupils are equal, round, and reactive to light.  Cardiovascular:     Heart sounds: Normal heart sounds.  Pulmonary:     Effort: Pulmonary effort is normal.  Abdominal:     Palpations: Abdomen is soft.  Musculoskeletal:  General: Normal range of motion.     Cervical back: Normal range of motion.  Skin:    General: Skin is warm and dry.  Neurological:     Mental Status: She is alert.  Psychiatric:        Attention and Perception: She is inattentive.        Mood and Affect: Affect is blunt.        Speech: She is noncommunicative.        Behavior: Behavior is not agitated.        Cognition and Memory: Cognition is impaired.     Assessment/Plan Plan is to continue ECT which so far is well tolerated and showing some signs of benefit  Mordecai Rasmussen, MD 03/08/2021, 6:12 PM

## 2021-03-08 NOTE — Addendum Note (Signed)
Addendum  created 03/08/21 1434 by Foye Deer, MD   Intraprocedure Attestations deleted, Intraprocedure Staff edited

## 2021-03-08 NOTE — Progress Notes (Signed)
PROGRESS NOTE    Erica Mueller  LEX:[id number] DOB: 1951-05-25 DOA: 02/07/2021 PCP: [name]    Brief Narrative:  Patient admitted 02/07/2021 with severe sepsis COVID-19 infection.  Past medical history of schizophrenia, hypertension, GERD, type 2 diabetes mellitus and depression and CAD.  The patient had a prolonged hospital course and never regained mental status.  She was treated for electrolyte abnormalities including hypernatremia, hypokalemia and hypophosphatemia.  She had an NG tube for 2 weeks and then a PEG tube was placed on 02/24/2021.  There was a PEG tube malfunction 9/19. IR was able to change over the guidewire.  Seen by psychiatry and restarted on psychiatric medications but still catatonic.  Psychiatry started ECT treatment.  9/27 continue to keep her eyes closed , does not move during my exam. No communication 9/28-Getting ECT today  Assessment & Plan:   Principal Problem:   Catatonia Active Problems:   Type 2 diabetes mellitus with hyperglycemia, without long-term current use of insulin (HCC)   Essential hypertension   Schizophrenia (HCC)   Sepsis (HCC)   Pressure injury of skin   Protein-calorie malnutrition, severe   Failure to thrive in adult   Hypernatremia   Hypokalemia   AKI (acute kidney injury) (HCC)   COVID-19 virus infection   PEG tube malfunction (HCC)   Hypophosphatemia  Schizophrenia with catatonia. No communication Psych input appreciated 9/28 ECG today   COVID infection. Severe sepsis secondary to aspiration pneumonia. Aspiration pneumonia. Improved On room air  Failure to thrive. Severe protein calorie malnutrition. Continue tube feeding per RD recommendation   DVT prophylaxis: Lovenox Code Status: full Family Communication: None at bedside Disposition Plan:      Status is: Inpatient   Remains inpatient appropriate because:Ongoing diagnostic testing needed not appropriate for outpatient work up and Inpatient level  of care appropriate due to severity of illness   Dispo: The patient is from: Home              Anticipated d/c is to: Home              Patient currently is not medically stable to d/c.              Difficult to place patient No       I/O last 3 completed shifts: In: 1594.3 [P.O.:50; Other:60; NG/GT:1484.3] Out: -  No intake/output data recorded.     Consultants:  psych  Procedures: None  Antimicrobials: None  Subjective: Open her eyes today.  I asked her if she was short of breath, chest pain, dizziness or abdominal pain and she denies it  Objective: Vitals:   03/07/21 1943 03/07/21 2326 03/08/21 0340 03/08/21 0748  BP: (!) 146/55 (!) 141/58 (!) 144/49 (!) 127/46  Pulse: 85 72 70 64  Resp: 16 16 16 17   Temp: 99 F (37.2 C) 98.5 F (36.9 C) 97.7 F (36.5 C) 98.2 F (36.8 C)  TempSrc: Oral     SpO2: 99% 98% 100% 100%  Weight:      Height:       No intake or output data in the 24 hours ending 03/08/21 0903  Filed Weights   03/05/21 0500 03/06/21 0449 03/07/21 0500  Weight: 45.5 kg 45.3 kg 45.7 kg    Examination: Opens eyes , nad Cta no w/r Regular s1/s2 no gallop Soft benign +bs No edema Awake     Data Reviewed: I have personally reviewed following labs and imaging studies  CBC: No results for input(s):  WBC, NEUTROABS, HGB, HCT, MCV, PLT in the last 168 hours. Basic Metabolic Panel: Recent Labs  Lab 03/02/21 0447  NA 133*  K 4.1  CL 102  CO2 24  GLUCOSE 231*  BUN 18  CREATININE 0.41*  CALCIUM 8.8*  MG 1.8  PHOS 3.4   GFR: Estimated Creatinine Clearance: 47.2 mL/min (A) (by C-G formula based on SCr of 0.41 mg/dL (L)). Liver Function Tests: No results for input(s): AST, ALT, ALKPHOS, BILITOT, PROT, ALBUMIN in the last 168 hours. No results for input(s): LIPASE, AMYLASE in the last 168 hours. No results for input(s): AMMONIA in the last 168 hours. Coagulation Profile: No results for input(s): INR, PROTIME in the last 168  hours. Cardiac Enzymes: No results for input(s): CKTOTAL, CKMB, CKMBINDEX, TROPONINI in the last 168 hours. BNP (last 3 results) No results for input(s): PROBNP in the last 8760 hours. HbA1C: No results for input(s): HGBA1C in the last 72 hours. CBG: Recent Labs  Lab 03/07/21 0732 03/07/21 0901 03/07/21 1147 03/07/21 1616 03/07/21 2059  GLUCAP 196* 194* 222* 244* 222*   Lipid Profile: No results for input(s): CHOL, HDL, LDLCALC, TRIG, CHOLHDL, LDLDIRECT in the last 72 hours. Thyroid Function Tests: No results for input(s): TSH, T4TOTAL, FREET4, T3FREE, THYROIDAB in the last 72 hours. Anemia Panel: No results for input(s): VITAMINB12, FOLATE, FERRITIN, TIBC, IRON, RETICCTPCT in the last 72 hours. Sepsis Labs: No results for input(s): PROCALCITON, LATICACIDVEN in the last 168 hours.  No results found for this or any previous visit (from the past 240 hour(s)).       Radiology Studies: No results found.      Scheduled Meds:  vitamin C  500 mg Per Tube Daily   chlorhexidine  15 mL Mouth Rinse BID   Chlorhexidine Gluconate Cloth  6 each Topical Daily   enoxaparin (LOVENOX) injection  40 mg Subcutaneous Q24H   feeding supplement (PROSource TF)  45 mL Per Tube Daily   free water  60 mL Per Tube Q4H   insulin aspart  0-9 Units Subcutaneous Q4H   mouth rinse  15 mL Mouth Rinse q12n4p   metoprolol tartrate  12.5 mg Per Tube BID   thiothixene  2 mg Per Tube BID   Continuous Infusions:  feeding supplement (OSMOLITE 1.2 CAL) Stopped (03/08/21 0000)     LOS: 29 days    Time spent: 35 minutes with more than 50% on COC    Lynn Ito, MD [facility]   To contact the attending provider between 7A-7P or the covering provider during after hours 7P-7A, please log into the web site [email or web address] and access using universal [place] password for that web site. If you do not have the password, please call the hospital operator.  03/08/2021, 9:03 AM

## 2021-03-08 NOTE — Anesthesia Preprocedure Evaluation (Signed)
Anesthesia Evaluation  Patient identified by MRN, date of birth, ID band Patient confused  General Assessment Comment:Spontaneous eye opening. Patient turns head to me upon calling her name. She says good morning to me after I greeted her. Does not say her name or DOB however  Reviewed: Allergy & Precautions, NPO status , Patient's Chart, lab work & pertinent test results  History of Anesthesia Complications Negative for: history of anesthetic complications  Airway Mallampati: II      Comment: Unable to examine due to patient's mental condition Dental  (+) Upper Dentures, Lower Dentures   Pulmonary COPD, Current Smoker,  Aspiration pneumonia with sepsis on admission.  This has resolved   Pulmonary exam normal        Cardiovascular hypertension, + CAD  Normal cardiovascular exam+ dysrhythmias (Previous nonsustained ventricular tachycardia secondary to electrolyte abnormalities.  On low-dose metoprolol)  Rhythm:Regular Rate:Normal  ECHO 02/2021: 1. Left ventricular ejection fraction, by estimation, is 30 to 35%. The  left ventricle has moderate to severely decreased function. The left  ventricle demonstrates global hypokinesis. Left ventricular diastolic  parameters are consistent with Grade I  diastolic dysfunction (impaired relaxation).  2. Right ventricular systolic function was not well visualized. The right  ventricular size is normal.  3. The mitral valve is degenerative. Mild mitral valve regurgitation.  4. The aortic valve was not well visualized. Aortic valve regurgitation  is mild to moderate.    Neuro/Psych Depression Catatonia Schizophrenianegative neurological ROS     GI/Hepatic Neg liver ROS, GERD  ,  Endo/Other  diabetes, Type 2, Oral Hypoglycemic Agents, Insulin Dependent  Renal/GU negative Renal ROS     Musculoskeletal   Abdominal Gtube in place. Soft abd  Peds  Hematology negative hematology  ROS (+)   Anesthesia Other Findings Anorexia secondary to schizophrenia Depression Type 2 diabetes mellitus with hyperglycemia, without long-term current use of insulin (HCC) Essential hypertension Noncompliance Schizophrenia (HCC) GERD (gastroesophageal reflux disease) History of schizophrenia Hyperlipemia Acute metabolic encephalopathy Severe sepsis (HCC) UTI (urinary tract infection) Sepsis (HCC) Pressure injury of skin Protein-calorie malnutrition, severe Failure to thrive in adult Hypernatremia Hypokalemia Catatonia AKI (acute kidney injury) (HCC) COVID-19 virus infection PEG tube malfunction (HCC) Sacral decubitus ulcer, present on admission Severe protein calorie malnutrition    Reproductive/Obstetrics                             Anesthesia Physical  Anesthesia Plan  ASA: 3  Anesthesia Plan: General   Post-op Pain Management:    Induction: Intravenous  PONV Risk Score and Plan: 3 and Ondansetron and TIVA  Airway Management Planned: Mask  Additional Equipment: None  Intra-op Plan:   Post-operative Plan:   Informed Consent: I have reviewed the patients History and Physical, chart, labs and discussed the procedure including the risks, benefits and alternatives for the proposed anesthesia with the patient or authorized representative who has indicated his/her understanding and acceptance.     Dental advisory given and Consent reviewed with POA  Plan Discussed with: CRNA and Surgeon  Anesthesia Plan Comments: (Previously discussed anesthesia with son who agrees to serial consent.)        Anesthesia Quick Evaluation  

## 2021-03-09 DIAGNOSIS — F061 Catatonic disorder due to known physiological condition: Secondary | ICD-10-CM | POA: Diagnosis not present

## 2021-03-09 LAB — CBC
HCT: 34.4 % — ABNORMAL LOW (ref 36.0–46.0)
Hemoglobin: 11.5 g/dL — ABNORMAL LOW (ref 12.0–15.0)
MCH: 32 pg (ref 26.0–34.0)
MCHC: 33.4 g/dL (ref 30.0–36.0)
MCV: 95.8 fL (ref 80.0–100.0)
Platelets: 335 10*3/uL (ref 150–400)
RBC: 3.59 MIL/uL — ABNORMAL LOW (ref 3.87–5.11)
RDW: 14.8 % (ref 11.5–15.5)
WBC: 7 10*3/uL (ref 4.0–10.5)
nRBC: 0 % (ref 0.0–0.2)

## 2021-03-09 LAB — GLUCOSE, CAPILLARY
Glucose-Capillary: 142 mg/dL — ABNORMAL HIGH (ref 70–99)
Glucose-Capillary: 164 mg/dL — ABNORMAL HIGH (ref 70–99)
Glucose-Capillary: 177 mg/dL — ABNORMAL HIGH (ref 70–99)
Glucose-Capillary: 188 mg/dL — ABNORMAL HIGH (ref 70–99)
Glucose-Capillary: 228 mg/dL — ABNORMAL HIGH (ref 70–99)
Glucose-Capillary: 272 mg/dL — ABNORMAL HIGH (ref 70–99)

## 2021-03-09 LAB — BASIC METABOLIC PANEL
Anion gap: 7 (ref 5–15)
BUN: 16 mg/dL (ref 8–23)
CO2: 28 mmol/L (ref 22–32)
Calcium: 9.4 mg/dL (ref 8.9–10.3)
Chloride: 101 mmol/L (ref 98–111)
Creatinine, Ser: 0.31 mg/dL — ABNORMAL LOW (ref 0.44–1.00)
GFR, Estimated: >60 mL/min (ref >60)
Glucose, Bld: 183 mg/dL — ABNORMAL HIGH (ref 70–99)
Potassium: 4.5 mmol/L (ref 3.5–5.1)
Sodium: 136 mmol/L (ref 135–145)

## 2021-03-09 NOTE — TOC Progression Note (Signed)
Transition of Care [facility]) - Progression Note    Patient Details  Name: Erica Mueller MRN: [id number] Date of Birth: [birth year]  [facility] CM/SW Contact  Barrie Dunker, RN Phone Number: 03/09/2021, 1:13 PM  Clinical Narrative:   plan to continue to get ECT treatments, will go back to [facility] at discharge    Expected Discharge Plan: Long Term Nursing Home Barriers to Discharge: Continued Medical Work up  Expected Discharge Plan and Services Expected Discharge Plan: Long Term Nursing Home In-house Referral: Clinical Social Work   Post Acute Care Choice: Nursing Home Living arrangements for the past 2 months:  ( Health Care / Long Term Care Facility)                                       Social Determinants of Health (SDOH) Interventions    Readmission Risk Interventions No flowsheet data found.

## 2021-03-09 NOTE — Progress Notes (Signed)
PROGRESS NOTE    Erica Mueller  IEP:[id number] DOB: Feb 26, 1951 DOA: 02/07/2021 PCP: [name]    Brief Narrative:  Patient admitted 02/07/2021 with severe sepsis COVID-19 infection.  Past medical history of schizophrenia, hypertension, GERD, type 2 diabetes mellitus and depression and CAD.  The patient had a prolonged hospital course and never regained mental status.  She was treated for electrolyte abnormalities including hypernatremia, hypokalemia and hypophosphatemia.  She had an NG tube for 2 weeks and then a PEG tube was placed on 02/24/2021.  There was a PEG tube malfunction 9/19. IR was able to change over the guidewire.  Seen by psychiatry and restarted on psychiatric medications but still catatonic.  Psychiatry started ECT treatment.  9/27 continue to keep her eyes closed , does not move during my exam. No communication 9/28-Getting ECT today 9/29 no issues.   Assessment & Plan:   Principal Problem:   Catatonia Active Problems:   Type 2 diabetes mellitus with hyperglycemia, without long-term current use of insulin (HCC)   Essential hypertension   Schizophrenia (HCC)   Sepsis (HCC)   Pressure injury of skin   Protein-calorie malnutrition, severe   Failure to thrive in adult   Hypernatremia   Hypokalemia   AKI (acute kidney injury) (HCC)   COVID-19 virus infection   PEG tube malfunction (HCC)   Hypophosphatemia  Schizophrenia with catatonia. No communication Psych input appreciated 9/29 s/p ECT yesterday, next treatment tomorrow   COVID infection. Severe sepsis secondary to aspiration pneumonia. Aspiration pneumonia. Improved On room air  Failure to thrive. Severe protein calorie malnutrition. Continue tube feeding per our RDs recommendation Speech following   DVT prophylaxis: Lovenox Code Status: full Family Communication: None at bedside Disposition Plan:      Status is: Inpatient   Remains inpatient appropriate because:Ongoing diagnostic  testing needed not appropriate for outpatient work up and Inpatient level of care appropriate due to severity of illness   Dispo: The patient is from: Home              Anticipated d/c is to: Home              Patient currently is not medically stable to d/c.              Difficult to place patient No       No intake/output data recorded. No intake/output data recorded.     Consultants:  psych  Procedures: None  Antimicrobials: None  Subjective: She is more communicative today.  Responds to my questions appropriately.  Denies shortness of breath chest pain.  Tells me she is fine.  Objective: Vitals:   03/08/21 1923 03/08/21 2311 03/09/21 0352 03/09/21 0822  BP: (!) 157/66 (!) 146/53 132/64 (!) 127/53  Pulse: (!) 105 76 74 79  Resp: 18 18 20 16   Temp: 98.3 F (36.8 C) 98 F (36.7 C) 98.1 F (36.7 C) 98.6 F (37 C)  TempSrc: Oral   Oral  SpO2: 98% 100% 100% 98%  Weight:      Height:       No intake or output data in the 24 hours ending 03/09/21 0915  Filed Weights   03/05/21 0500 03/06/21 0449 03/07/21 0500  Weight: 45.5 kg 45.3 kg 45.7 kg    Examination: Calm, awake, NAD CTA no wheeze rales Regular S1-S2 no gallops Soft benign positive bowel sounds No edema Awake and alert this am Mood and affect appropriate in current setting     Data Reviewed:  I have personally reviewed following labs and imaging studies  CBC: Recent Labs  Lab 03/09/21 0633  WBC 7.0  HGB 11.5*  HCT 34.4*  MCV 95.8  PLT 335   Basic Metabolic Panel: Recent Labs  Lab 03/09/21 0633  NA 136  K 4.5  CL 101  CO2 28  GLUCOSE 183*  BUN 16  CREATININE 0.31*  CALCIUM 9.4   GFR: Estimated Creatinine Clearance: 47.2 mL/min (A) (by C-G formula based on SCr of 0.31 mg/dL (L)). Liver Function Tests: No results for input(s): AST, ALT, ALKPHOS, BILITOT, PROT, ALBUMIN in the last 168 hours. No results for input(s): LIPASE, AMYLASE in the last 168 hours. No results for input(s):  AMMONIA in the last 168 hours. Coagulation Profile: No results for input(s): INR, PROTIME in the last 168 hours. Cardiac Enzymes: No results for input(s): CKTOTAL, CKMB, CKMBINDEX, TROPONINI in the last 168 hours. BNP (last 3 results) No results for input(s): PROBNP in the last 8760 hours. HbA1C: No results for input(s): HGBA1C in the last 72 hours. CBG: Recent Labs  Lab 03/08/21 1650 03/08/21 1934 03/08/21 2309 03/09/21 0350 03/09/21 0842  GLUCAP 111* 167* 183* 164* 228*   Lipid Profile: No results for input(s): CHOL, HDL, LDLCALC, TRIG, CHOLHDL, LDLDIRECT in the last 72 hours. Thyroid Function Tests: No results for input(s): TSH, T4TOTAL, FREET4, T3FREE, THYROIDAB in the last 72 hours. Anemia Panel: No results for input(s): VITAMINB12, FOLATE, FERRITIN, TIBC, IRON, RETICCTPCT in the last 72 hours. Sepsis Labs: No results for input(s): PROCALCITON, LATICACIDVEN in the last 168 hours.  No results found for this or any previous visit (from the past 240 hour(s)).       Radiology Studies: No results found.      Scheduled Meds:  vitamin C  500 mg Per Tube Daily   chlorhexidine  15 mL Mouth Rinse BID   Chlorhexidine Gluconate Cloth  6 each Topical Daily   enoxaparin (LOVENOX) injection  40 mg Subcutaneous Q24H   feeding supplement (PROSource TF)  45 mL Per Tube Daily   free water  60 mL Per Tube Q4H   insulin aspart  0-9 Units Subcutaneous Q4H   mouth rinse  15 mL Mouth Rinse q12n4p   metoprolol tartrate  12.5 mg Per Tube BID   thiothixene  2 mg Per Tube BID   Continuous Infusions:  feeding supplement (OSMOLITE 1.2 CAL) Stopped (03/08/21 0000)     LOS: 30 days    Time spent: 35 minutes with more than 50% on COC    Lynn Ito, MD [facility]   To contact the attending provider between 7A-7P or the covering provider during after hours 7P-7A, please log into the web site [email or web address] and access using universal [place] password for that web  site. If you do not have the password, please call the hospital operator.  03/09/2021, 9:15 AM

## 2021-03-09 NOTE — Consult Note (Signed)
[facility] Face-to-Face Psychiatry Consult   Reason for Consult: Follow-up consult 70 year old woman with a history of schizophrenia who has been in the hospital with post-COVID catatonia now receiving ECT Referring Physician: Marylu Lund Patient Identification: Erica Mueller MRN:  [id number] Principal Diagnosis: Catatonia Diagnosis:  Principal Problem:   Catatonia Active Problems:   Type 2 diabetes mellitus with hyperglycemia, without long-term current use of insulin (HCC)   Essential hypertension   Schizophrenia (HCC)   Sepsis (HCC)   Pressure injury of skin   Protein-calorie malnutrition, severe   Failure to thrive in adult   Hypernatremia   Hypokalemia   AKI (acute kidney injury) (HCC)   COVID-19 virus infection   PEG tube malfunction (HCC)   Hypophosphatemia   Total Time spent with patient: 30 minutes  Subjective:   Erica Mueller is a 70 y.o. female patient admitted with patient was originally admitted with what turned out to be COVID sepsis.  Now suffering from an extended catatonia.  HPI: Patient seen chart reviewed.  Patient was being fed by a nursing aide when I came in.  I spoke with her and she turned and made appropriate eye contact.  I asked her how she was and she answered me with full words articulately and appropriately.  Dramatic difference from all previous encounters this day.  She was able to answer other questions with a couple of words.  Still confused did not understand her current situation and not able to engage in a treatment dialogue.  Making an effort however to eat and getting some of the soft diet down.  Past Psychiatric History: History of schizophrenia  Risk to Self:   Risk to Others:   Prior Inpatient Therapy:   Prior Outpatient Therapy:    Past Medical History:  Past Medical History:  Diagnosis Date   Coronary artery disease    Depression    Diabetes mellitus without complication (HCC)    Elevated lipids    GERD (gastroesophageal reflux disease)     Hypertension    Schizophrenia (HCC)    Syphilis (acquired)     Past Surgical History:  Procedure Laterality Date   COLONOSCOPY     COLONOSCOPY WITH PROPOFOL N/A 09/01/2015   Procedure: COLONOSCOPY WITH PROPOFOL;  Surgeon: Wallace Cullens, MD;  Location: [facility] ENDOSCOPY;  Service: Gastroenterology;  Laterality: N/A;   IR FLUORO RM 30-60 MIN  02/23/2021   IR GASTROSTOMY TUBE MOD SED  02/24/2021   IR REPLC GASTRO/COLONIC TUBE PERCUT W/FLUORO  02/27/2021   TUBAL LIGATION     wisdom teeth removal     Family History:  Family History  Problem Relation Age of Onset   Cancer Mother    Alcoholism Mother    Bone cancer Father    Heart disease Father    Diabetes Maternal Grandmother    Suicidality Paternal Uncle    Breast cancer Neg Hx    Family Psychiatric  History: Father suicide Social History:  Social History   Substance and Sexual Activity  Alcohol Use Yes   Alcohol/week: 3.0 standard drinks   Types: 2 Cans of beer, 1 Shots of liquor per week     Social History   Substance and Sexual Activity  Drug Use Not Currently   Types: Marijuana   Comment: PAST     Social History   Socioeconomic History   Marital status: Divorced    Spouse name: Not on file   Number of children: Not on file   Years of education: Not on  file   Highest education level: Not on file  Occupational History   Not on file  Tobacco Use   Smoking status: Every Day    Packs/day: 1.00    Years: 48.00    Pack years: 48.00    Types: Cigarettes    Start date: 12/20/1969   Smokeless tobacco: Never  Vaping Use   Vaping Use: Never used  Substance and Sexual Activity   Alcohol use: Yes    Alcohol/week: 3.0 standard drinks    Types: 2 Cans of beer, 1 Shots of liquor per week   Drug use: Not Currently    Types: Marijuana    Comment: PAST    Sexual activity: Not Currently    Birth control/protection: None  Other Topics Concern   Not on file  Social History Narrative   Not on file   Social Determinants of  Health   Financial Resource Strain: Not on file  Food Insecurity: Not on file  Transportation Needs: Not on file  Physical Activity: Not on file  Stress: Not on file  Social Connections: Not on file   Additional Social History:    Allergies:  No Known Allergies  Labs:  Results for orders placed or performed during the hospital encounter of 02/07/21 (from the past 48 hour(s))  Glucose, capillary     Status: Abnormal   Collection Time: 03/07/21  4:16 PM  Result Value Ref Range   Glucose-Capillary 244 (H) 70 - 99 mg/dL    Comment: Glucose reference range applies only to samples taken after fasting for at least 8 hours.  Glucose, capillary     Status: Abnormal   Collection Time: 03/07/21  8:59 PM  Result Value Ref Range   Glucose-Capillary 222 (H) 70 - 99 mg/dL    Comment: Glucose reference range applies only to samples taken after fasting for at least 8 hours.   Comment 1 Notify RN    Comment 2 Document in Chart   Glucose, capillary     Status: Abnormal   Collection Time: 03/07/21 11:52 PM  Result Value Ref Range   Glucose-Capillary 158 (H) 70 - 99 mg/dL    Comment: Glucose reference range applies only to samples taken after fasting for at least 8 hours.   Comment 1 Notify RN    Comment 2 Document in Chart   Glucose, capillary     Status: Abnormal   Collection Time: 03/08/21  3:45 AM  Result Value Ref Range   Glucose-Capillary 133 (H) 70 - 99 mg/dL    Comment: Glucose reference range applies only to samples taken after fasting for at least 8 hours.   Comment 1 Notify RN    Comment 2 Document in Chart   Glucose, capillary     Status: Abnormal   Collection Time: 03/08/21  8:06 AM  Result Value Ref Range   Glucose-Capillary 155 (H) 70 - 99 mg/dL    Comment: Glucose reference range applies only to samples taken after fasting for at least 8 hours.  Glucose, capillary     Status: Abnormal   Collection Time: 03/08/21  1:21 PM  Result Value Ref Range   Glucose-Capillary 159 (H)  70 - 99 mg/dL    Comment: Glucose reference range applies only to samples taken after fasting for at least 8 hours.  Glucose, capillary     Status: Abnormal   Collection Time: 03/08/21  4:50 PM  Result Value Ref Range   Glucose-Capillary 111 (H) 70 - 99 mg/dL  Comment: Glucose reference range applies only to samples taken after fasting for at least 8 hours.  Glucose, capillary     Status: Abnormal   Collection Time: 03/08/21  7:34 PM  Result Value Ref Range   Glucose-Capillary 167 (H) 70 - 99 mg/dL    Comment: Glucose reference range applies only to samples taken after fasting for at least 8 hours.   Comment 1 Notify RN   Glucose, capillary     Status: Abnormal   Collection Time: 03/08/21 11:09 PM  Result Value Ref Range   Glucose-Capillary 183 (H) 70 - 99 mg/dL    Comment: Glucose reference range applies only to samples taken after fasting for at least 8 hours.   Comment 1 Notify RN   Glucose, capillary     Status: Abnormal   Collection Time: 03/09/21  3:50 AM  Result Value Ref Range   Glucose-Capillary 164 (H) 70 - 99 mg/dL    Comment: Glucose reference range applies only to samples taken after fasting for at least 8 hours.   Comment 1 Notify RN   Basic metabolic panel     Status: Abnormal   Collection Time: 03/09/21  6:33 AM  Result Value Ref Range   Sodium 136 135 - 145 mmol/L   Potassium 4.5 3.5 - 5.1 mmol/L   Chloride 101 98 - 111 mmol/L   CO2 28 22 - 32 mmol/L   Glucose, Bld 183 (H) 70 - 99 mg/dL    Comment: Glucose reference range applies only to samples taken after fasting for at least 8 hours.   BUN 16 8 - 23 mg/dL   Creatinine, Ser 4.94 (L) 0.44 - 1.00 mg/dL   Calcium 9.4 8.9 - 49.6 mg/dL   GFR, Estimated >75 >91 mL/min    Comment: (NOTE) Calculated using the CKD-EPI Creatinine Equation (2021)    Anion gap 7 5 - 15    Comment: Performed at [facility], [address] [place], [place] [address]  CBC     Status: Abnormal   Collection Time: 03/09/21   6:33 AM  Result Value Ref Range   WBC 7.0 4.0 - 10.5 K/uL   RBC 3.59 (L) 3.87 - 5.11 MIL/uL   Hemoglobin 11.5 (L) 12.0 - 15.0 g/dL   HCT 65.9 (L) 93.5 - 70.1 %   MCV 95.8 80.0 - 100.0 fL   MCH 32.0 26.0 - 34.0 pg   MCHC 33.4 30.0 - 36.0 g/dL   RDW 77.9 39.0 - 30.0 %   Platelets 335 150 - 400 K/uL   nRBC 0.0 0.0 - 0.2 %    Comment: Performed at [facility], [address] [place], [place] [address]  Glucose, capillary     Status: Abnormal   Collection Time: 03/09/21  8:42 AM  Result Value Ref Range   Glucose-Capillary 228 (H) 70 - 99 mg/dL    Comment: Glucose reference range applies only to samples taken after fasting for at least 8 hours.  Glucose, capillary     Status: Abnormal   Collection Time: 03/09/21 12:16 PM  Result Value Ref Range   Glucose-Capillary 177 (H) 70 - 99 mg/dL    Comment: Glucose reference range applies only to samples taken after fasting for at least 8 hours.    Current Facility-Administered Medications  Medication Dose Route Frequency Provider Last Rate Last Admin   acetaminophen (TYLENOL) suppository 650 mg  650 mg Rectal Q4H PRN Jimmye Norman, NP       acetaminophen (  TYLENOL) tablet 650 mg  650 mg Oral Q4H PRN Harlon Ditty D, NP       ascorbic acid (VITAMIN C) tablet 500 mg  500 mg Per Tube Daily Charise Killian, MD   500 mg at 03/09/21 1050   chlorhexidine (PERIDEX) 0.12 % solution 15 mL  15 mL Mouth Rinse BID Alberteen Sam, MD   15 mL at 03/09/21 1050   Chlorhexidine Gluconate Cloth 2 % PADS 6 each  6 each Topical Daily Erin Fulling, MD   6 each at 03/09/21 1051   docusate sodium (COLACE) capsule 100 mg  100 mg Oral BID PRN Judithe Modest, NP       enoxaparin (LOVENOX) injection 40 mg  40 mg Subcutaneous Q24H Bari Mantis A, RPH   40 mg at 03/08/21 2121   feeding supplement (OSMOLITE 1.2 CAL) liquid 1,000 mL  1,000 mL Per Tube Continuous Lynn Ito, MD   Stopped at 03/08/21 0000   feeding supplement (PROSource  TF) liquid 45 mL  45 mL Per Tube Daily Lynn Ito, MD   45 mL at 03/09/21 1054   free water 60 mL  60 mL Per Tube Q4H Lynn Ito, MD   60 mL at 03/09/21 1223   insulin aspart (novoLOG) injection 0-9 Units  0-9 Units Subcutaneous Q4H Alford Highland, MD   2 Units at 03/09/21 1222   MEDLINE mouth rinse  15 mL Mouth Rinse q12n4p Danford, Earl Lites, MD   15 mL at 03/09/21 1223   metoprolol tartrate (LOPRESSOR) 25 mg/10 mL oral suspension 12.5 mg  12.5 mg Per Tube BID Alford Highland, MD   12.5 mg at 03/09/21 1051   morphine 2 MG/ML injection 2 mg  2 mg Intravenous Q4H PRN Jimmye Norman, NP       ondans[facility]) injection 4 mg  4 mg Intravenous Q6H PRN Judithe Modest, NP       polyethylene glycol (MIRALAX / GLYCOLAX) packet 17 g  17 g Oral Daily PRN Judithe Modest, NP   17 g at 03/08/21 1641   thiothixene (NAVANE) capsule 2 mg  2 mg Per Tube BID Fedora Knisely, Jackquline Denmark, MD   2 mg at 03/09/21 1051    Musculoskeletal: Strength & Muscle Tone: decreased Gait & Station: unable to stand Patient leans: N/A            Psychiatric Specialty Exam:  Presentation  General Appearance:  No data recorded Eye Contact: No data recorded Speech: No data recorded Speech Volume: No data recorded Handedness: No data recorded  Mood and Affect  Mood: No data recorded Affect: No data recorded  Thought Process  Thought Processes: No data recorded Descriptions of Associations:No data recorded Orientation:No data recorded Thought Content:No data recorded History of Schizophrenia/Schizoaffective disorder:No data recorded Duration of Psychotic Symptoms:No data recorded Hallucinations:No data recorded Ideas of Reference:No data recorded Suicidal Thoughts:No data recorded Homicidal Thoughts:No data recorded  Sensorium  Memory: No data recorded Judgment: No data recorded Insight: No data recorded  Executive Functions  Concentration: No data recorded Attention  Span: No data recorded Recall: No data recorded Fund of Knowledge: No data recorded Language: No data recorded  Psychomotor Activity  Psychomotor Activity: No data recorded  Assets  Assets: No data recorded  Sleep  Sleep: No data recorded  Physical Exam: Physical Exam Vitals and nursing note reviewed.  Constitutional:      Appearance: She is ill-appearing.  HENT:     Head: Normocephalic and atraumatic.  Mouth/Throat:     Pharynx: Oropharynx is clear.  Eyes:     Pupils: Pupils are equal, round, and reactive to light.  Cardiovascular:     Rate and Rhythm: Normal rate and regular rhythm.  Pulmonary:     Effort: Pulmonary effort is normal.     Breath sounds: Normal breath sounds.  Abdominal:     General: Abdomen is flat.     Palpations: Abdomen is soft.  Musculoskeletal:        General: Normal range of motion.  Skin:    General: Skin is warm and dry.  Neurological:     General: No focal deficit present.     Mental Status: She is alert. Mental status is at baseline.     Comments: The symptoms come and go but on the whole she looks most of the time like she has pretty significant tardive dyskinesia in her mouth.  Psychiatric:        Attention and Perception: She is inattentive.        Mood and Affect: Mood normal. Affect is blunt.        Speech: Speech is delayed.        Behavior: Behavior is slowed.        Thought Content: Thought content normal. Thought content does not include homicidal or suicidal ideation.        Cognition and Memory: Cognition is impaired. Memory is impaired.   Review of Systems  Constitutional: Negative.   HENT: Negative.    Eyes: Negative.   Respiratory: Negative.    Cardiovascular: Negative.   Gastrointestinal: Negative.   Musculoskeletal: Negative.   Skin: Negative.   Neurological: Negative.   Psychiatric/Behavioral: Negative.    Blood pressure (!) 127/53, pulse 79, temperature 98.6 F (37 C), temperature source Oral, resp.  rate 16, height 5\' 2"  (1.575 m), weight 45.7 kg, SpO2 98 %. Body mass index is 18.43 kg/m.  Treatment Plan Summary: Plan patient is much better today.  Catatonic symptoms much improved.  Able to use her arm somewhat and able to engage with an aide for feeding.  Speaking and answering questions.  All the improvements 1 would hope with ECT.  Nevertheless I recommend we continue the treatment at this point.  She is still far off her baseline.  I want to make sure she is going to stay well and that if any of this is also depression we can get that improved.  Still has quite a way to go with recovery to regain her ability to eat normally and move about.  No change to medicine.  N.p.o. order for tonight.  Disposition:  see note  , MD 03/09/2021 2:38 PM

## 2021-03-10 ENCOUNTER — Inpatient Hospital Stay: Payer: Medicare Other | Admitting: Certified Registered Nurse Anesthetist

## 2021-03-10 ENCOUNTER — Other Ambulatory Visit: Payer: Self-pay | Admitting: Psychiatry

## 2021-03-10 ENCOUNTER — Inpatient Hospital Stay (HOSPITAL_COMMUNITY)
Admit: 2021-03-10 | Discharge: 2021-03-10 | Disposition: A | Discharge status: Home / Self Care | Payer: Medicare Other | Attending: Interventional Radiology | Admitting: Interventional Radiology

## 2021-03-10 DIAGNOSIS — F250 Schizoaffective disorder, bipolar type: Secondary | ICD-10-CM

## 2021-03-10 LAB — GLUCOSE, CAPILLARY
Glucose-Capillary: 132 mg/dL — ABNORMAL HIGH (ref 70–99)
Glucose-Capillary: 149 mg/dL — ABNORMAL HIGH (ref 70–99)
Glucose-Capillary: 164 mg/dL — ABNORMAL HIGH (ref 70–99)
Glucose-Capillary: 234 mg/dL — ABNORMAL HIGH (ref 70–99)
Glucose-Capillary: 89 mg/dL (ref 70–99)

## 2021-03-10 MED ORDER — GLYCOPYRROLATE 0.2 MG/ML IJ SOLN
0.1000 mg | Freq: Once | INTRAMUSCULAR | Status: AC
  Administered 2021-03-10: 0.1 mg via INTRAVENOUS

## 2021-03-10 MED ORDER — METHOHEXITAL SODIUM 100 MG/10ML IV SOSY
PREFILLED_SYRINGE | INTRAVENOUS | Status: DC | PRN
  Administered 2021-03-10: 50 mg via INTRAVENOUS

## 2021-03-10 MED ORDER — SUCCINYLCHOLINE CHLORIDE 200 MG/10ML IV SOSY
PREFILLED_SYRINGE | INTRAVENOUS | Status: DC | PRN
  Administered 2021-03-10: 70 mg via INTRAVENOUS

## 2021-03-10 MED ORDER — KETOROLAC TROMETHAMINE 30 MG/ML IJ SOLN
30.0000 mg | Freq: Once | INTRAMUSCULAR | Status: AC
  Administered 2021-03-10: 30 mg via INTRAVENOUS

## 2021-03-10 MED ORDER — SODIUM CHLORIDE 0.9 % IV SOLN
500.0000 mL | Freq: Once | INTRAVENOUS | Status: AC
  Administered 2021-03-10: 500 mL via INTRAVENOUS

## 2021-03-10 MED ORDER — GLYCOPYRROLATE 0.2 MG/ML IJ SOLN
INTRAMUSCULAR | Status: AC
  Filled 2021-03-10: qty 1

## 2021-03-10 MED ORDER — KETOROLAC TROMETHAMINE 30 MG/ML IJ SOLN
INTRAMUSCULAR | Status: AC
  Filled 2021-03-10: qty 1

## 2021-03-10 MED ORDER — SODIUM CHLORIDE 0.9 % IV SOLN: INTRAVENOUS | Status: DC | PRN

## 2021-03-10 NOTE — Transfer of Care (Signed)
Immediate Anesthesia Transfer of Care Note  Patient: Erica Mueller  Procedure(s) Performed: * No procedures listed *  Patient Location: PACU  Anesthesia Type:General  Level of Consciousness: sedated  Airway & Oxygen Therapy: Patient Spontanous Breathing and Patient connected to face mask oxygen  Post-op Assessment: Report given to RN and Post -op Vital signs reviewed and stable  Post vital signs: Reviewed and stable  Last Vitals:  Vitals:   03/10/21 1346 03/10/21 1347  BP: (!) 158/68 (!) 158/68  Pulse: 90 92  Resp: (!) 22 (!) 22  Temp: 36.8 C 36.8 C  SpO2: 100% 100%    Complications: No apparent anesthesia complications

## 2021-03-10 NOTE — Progress Notes (Signed)
Patient alert and oriented x 1-2, no evidence of pain via PAINAID assess. PEG tube patent, NPO at MN, tube clamped for scheduled ECT. Vitals stable, no respiratory distress on room air. Stable condition at the end of shift, will continue to monitor.

## 2021-03-10 NOTE — Anesthesia Preprocedure Evaluation (Signed)
Anesthesia Evaluation  Patient identified by MRN, date of birth, ID band Patient confused  General Assessment Comment:Spontaneous eye opening. Patient turns head to me upon calling her name. She says good morning to me after I greeted her. Does not say her name or DOB however  Reviewed: Allergy & Precautions, NPO status , Patient's Chart, lab work & pertinent test results  History of Anesthesia Complications Negative for: history of anesthetic complications  Airway Mallampati: II      Comment: Unable to examine due to patient's mental condition Dental  (+) Upper Dentures, Lower Dentures   Pulmonary COPD, Current Smoker,  Aspiration pneumonia with sepsis on admission.  This has resolved   Pulmonary exam normal        Cardiovascular hypertension, + CAD  Normal cardiovascular exam+ dysrhythmias (Previous nonsustained ventricular tachycardia secondary to electrolyte abnormalities.  On low-dose metoprolol)  Rhythm:Regular Rate:Normal  ECHO 02/2021: 1. Left ventricular ejection fraction, by estimation, is 30 to 35%. The  left ventricle has moderate to severely decreased function. The left  ventricle demonstrates global hypokinesis. Left ventricular diastolic  parameters are consistent with Grade I  diastolic dysfunction (impaired relaxation).  2. Right ventricular systolic function was not well visualized. The right  ventricular size is normal.  3. The mitral valve is degenerative. Mild mitral valve regurgitation.  4. The aortic valve was not well visualized. Aortic valve regurgitation  is mild to moderate.    Neuro/Psych Depression Catatonia Schizophrenianegative neurological ROS     GI/Hepatic Neg liver ROS, GERD  ,  Endo/Other  diabetes, Type 2, Oral Hypoglycemic Agents, Insulin Dependent  Renal/GU negative Renal ROS     Musculoskeletal   Abdominal Gtube in place. Soft abd  Peds  Hematology negative hematology  ROS (+)   Anesthesia Other Findings Anorexia secondary to schizophrenia Depression Type 2 diabetes mellitus with hyperglycemia, without long-term current use of insulin (HCC) Essential hypertension Noncompliance Schizophrenia (HCC) GERD (gastroesophageal reflux disease) History of schizophrenia Hyperlipemia Acute metabolic encephalopathy Severe sepsis (HCC) UTI (urinary tract infection) Sepsis (HCC) Pressure injury of skin Protein-calorie malnutrition, severe Failure to thrive in adult Hypernatremia Hypokalemia Catatonia AKI (acute kidney injury) (HCC) COVID-19 virus infection PEG tube malfunction (HCC) Sacral decubitus ulcer, present on admission Severe protein calorie malnutrition    Reproductive/Obstetrics                             Anesthesia Physical  Anesthesia Plan  ASA: 3  Anesthesia Plan: General   Post-op Pain Management:    Induction: Intravenous  PONV Risk Score and Plan: 3 and Ondansetron and TIVA  Airway Management Planned: Mask  Additional Equipment: None  Intra-op Plan:   Post-operative Plan:   Informed Consent: I have reviewed the patients History and Physical, chart, labs and discussed the procedure including the risks, benefits and alternatives for the proposed anesthesia with the patient or authorized representative who has indicated his/her understanding and acceptance.     Dental advisory given and Consent reviewed with POA  Plan Discussed with: CRNA and Surgeon  Anesthesia Plan Comments: (Previously discussed anesthesia with son who agrees to serial consent.)        Anesthesia Quick Evaluation

## 2021-03-10 NOTE — Progress Notes (Signed)
Nutrition Follow-up  DOCUMENTATION CODES:  Severe malnutrition in context of social or environmental circumstances  INTERVENTION:  Continue diet as ordered per SLP, advance as tolerated Nursing staff to assist with meal set-up and feeding Nursing to record meal intake percentages to assess oral intake. Recommend continuing enteral feeds via PEG. Monitor for ability to adjust to nocturnal feeds once pt has completed ECT treatments: Osmolite 1.2 @ 19m/h (1.08L/d) and 1 packet of prosource (40kcal and 11g of protein) 642mfree water q4h Regimen will provide 1336 kcal, 71g of protein, and [id number] free water (TF+flush)  NUTRITION DIAGNOSIS:  Severe Malnutrition related to social / environmental circumstances as evidenced by severe fat depletion, severe muscle depletion. -ongoing  GOAL:  Patient will meet greater than or equal to 90% of their needs - met with TF  MONITOR:  Labs, Weight trends, TF tolerance, Skin, I & O's  REASON FOR ASSESSMENT:  Malnutrition Screening Tool    ASSESSMENT:  [age]o. female with medical history of HTN, DM, schizophrenia, depression, CAD, GERD, recent COVID 19 64d HLD who is admitted with aspiration PNA and sepsis.  9/16 PEG placed 9/20 g-tube exchanged due to malfunction 9/21 ECT initiated  9/26 diet advanced per SLP  Pt out of room for ECT at this time. Reviewed intake since diet advancement earlier this week. No new meals recorded since 9/26 in flowsheet, but several MD notes indicate pt is eating at least some of her meals. TF continue to infuse and are well tolerated. Currently being held for ECT. Will continue current nutrition plan at this time. Will request all meals be documented to better account for oral intake and need to adjust feeds.     Also noted that last BM was 9/24. PRN bowel regimen in place, but non scheduled. Last prn medication was given 9/28 - passed along info to RN.  Nutritionally Relevant Medications: Scheduled Meds:   vitamin C  500 mg Per Tube Daily   feeding supplement (PROSource TF)  45 mL Per Tube Daily   free water  60 mL Per Tube Q4H   insulin aspart  0-9 Units Subcutaneous Q4H   Continuous Infusions:  feeding supplement (OSMOLITE 1.2 CAL) Stopped (03/08/21 0000)   PRN Meds: docusate sodium, ondansetron, polyethylene glycol  Labs Reviewed: SBG ranges from 272 mg/dL over the last 24 hours HgbA1c: 7/9% (8/30)  NUTRITION - FOCUSED PHYSICAL EXAM: Flowsheet Row Most Recent Value  Orbital Region Moderate depletion  Upper Arm Region Severe depletion  Thoracic and Lumbar Region Severe depletion  Buccal Region Moderate depletion  Temple Region Moderate depletion  Clavicle Bone Region Moderate depletion  Clavicle and Acromion Bone Region Severe depletion  Scapular Bone Region Severe depletion  Dorsal Hand Moderate depletion  Patellar Region Severe depletion  Anterior Thigh Region Severe depletion  Posterior Calf Region Moderate depletion  Edema (RD Assessment) None  Hair Reviewed  Eyes Reviewed  Mouth Reviewed  Skin Reviewed  Nails Reviewed   Diet Order:   Diet Order             Diet NPO time specified  Diet effective midnight                  EDUCATION NEEDS:  No education needs have been identified at this time  Skin:  Skin Assessment: Skin Integrity Issues: Skin Integrity Issues:: Stage II Stage II: sacrum  Last BM:  9/24  Height:  Ht Readings from Last 1 Encounters:  03/03/21 5' 2"  (1.575 m)  Weight:  Wt Readings from Last 1 Encounters:  03/10/21 45.9 kg   Ideal Body Weight:  50 kg  BMI:  Body mass index is 18.51 kg/m.  Estimated Nutritional Needs:  Kcal:  1500-1700 kcal/d Protein:  75-85 g/d Fluid:  1.2-1.4L/day  Ranell Patrick, RD, LDN Clinical Dietitian Pager on Amion

## 2021-03-10 NOTE — Progress Notes (Signed)
PROGRESS NOTE    Erica Mueller  FWY:[id number] DOB: August 24, 1950 DOA: 02/07/2021 PCP: [name]    Brief Narrative:  Patient admitted 02/07/2021 with severe sepsis COVID-19 infection.  Past medical history of schizophrenia, hypertension, GERD, type 2 diabetes mellitus and depression and CAD.  The patient had a prolonged hospital course and never regained mental status.  She was treated for electrolyte abnormalities including hypernatremia, hypokalemia and hypophosphatemia.  She had an NG tube for 2 weeks and then a PEG tube was placed on 02/24/2021.  There was a PEG tube malfunction 9/19. IR was able to change over the guidewire.  Seen by psychiatry and restarted on psychiatric medications but still catatonic.  Psychiatry started ECT treatment.  9/27 continue to keep her eyes closed , does not move during my exam. No communication 9/28-Getting ECT today 9/29 no issues.  9/30-ETC today.   Assessment & Plan:   Principal Problem:   Catatonia Active Problems:   Type 2 diabetes mellitus with hyperglycemia, without long-term current use of insulin (HCC)   Essential hypertension   Schizophrenia (HCC)   Sepsis (HCC)   Pressure injury of skin   Protein-calorie malnutrition, severe   Failure to thrive in adult   Hypernatremia   Hypokalemia   AKI (acute kidney injury) (HCC)   COVID-19 virus infection   PEG tube malfunction (HCC)   Hypophosphatemia  Schizophrenia with catatonia. No communication 9/30-s/p ETC on 9/28 and plan for today Psych following. Slow improvement     COVID infection. Severe sepsis secondary to aspiration pneumonia. Aspiration pneumonia. Improved  On RA  Failure to thrive. Severe protein calorie malnutrition. Continue tube feeding per our RD recommendation  Speech following     DVT prophylaxis: Lovenox Code Status: full Family Communication: None at bedside Disposition Plan:      Status is: Inpatient   Remains inpatient appropriate  because:Ongoing diagnostic testing needed not appropriate for outpatient work up and Inpatient level of care appropriate due to severity of illness   Dispo: The patient is from: Home              Anticipated d/c is to: Home              Patient currently is not medically stable to d/c.              Difficult to place patient No       I/O last 3 completed shifts: In: -  Out: 450 [Urine:450] No intake/output data recorded.     Consultants:  psych  Procedures: None  Antimicrobials: None  Subjective: No overnight issues, no sob, or cp   Objective: Vitals:   03/09/21 2029 03/10/21 0418 03/10/21 0440 03/10/21 0741  BP: (!) 131/52  (!) 124/54 (!) 125/56  Pulse: 80  73 85  Resp: 18  18 15   Temp: (!) 97.4 F (36.3 C)  (!) 97.4 F (36.3 C) 97.7 F (36.5 C)  TempSrc:      SpO2: 99%  100% 97%  Weight:  45.9 kg    Height:        Intake/Output Summary (Last 24 hours) at 03/10/2021 0901 Last data filed at 03/10/2021 0443 Gross per 24 hour  Intake --  Output 450 ml  Net -450 ml    Filed Weights   03/07/21 0500 03/09/21 0500 03/10/21 0418  Weight: 45.7 kg 46.1 kg 45.9 kg    Examination: Calm, nad Cta no w/r Rrr s1s2 no gallops Soft benign +bs No edema Mood and  affect appropriate in current setting     Data Reviewed: I have personally reviewed following labs and imaging studies  CBC: Recent Labs  Lab 03/09/21 0633  WBC 7.0  HGB 11.5*  HCT 34.4*  MCV 95.8  PLT 335   Basic Metabolic Panel: Recent Labs  Lab 03/09/21 0633  NA 136  K 4.5  CL 101  CO2 28  GLUCOSE 183*  BUN 16  CREATININE 0.31*  CALCIUM 9.4   GFR: Estimated Creatinine Clearance: 47.4 mL/min (A) (by C-G formula based on SCr of 0.31 mg/dL (L)). Liver Function Tests: No results for input(s): AST, ALT, ALKPHOS, BILITOT, PROT, ALBUMIN in the last 168 hours. No results for input(s): LIPASE, AMYLASE in the last 168 hours. No results for input(s): AMMONIA in the last 168  hours. Coagulation Profile: No results for input(s): INR, PROTIME in the last 168 hours. Cardiac Enzymes: No results for input(s): CKTOTAL, CKMB, CKMBINDEX, TROPONINI in the last 168 hours. BNP (last 3 results) No results for input(s): PROBNP in the last 8760 hours. HbA1C: No results for input(s): HGBA1C in the last 72 hours. CBG: Recent Labs  Lab 03/09/21 1651 03/09/21 2023 03/09/21 2357 03/10/21 0414 03/10/21 0842  GLUCAP 272* 142* 188* 89 132*   Lipid Profile: No results for input(s): CHOL, HDL, LDLCALC, TRIG, CHOLHDL, LDLDIRECT in the last 72 hours. Thyroid Function Tests: No results for input(s): TSH, T4TOTAL, FREET4, T3FREE, THYROIDAB in the last 72 hours. Anemia Panel: No results for input(s): VITAMINB12, FOLATE, FERRITIN, TIBC, IRON, RETICCTPCT in the last 72 hours. Sepsis Labs: No results for input(s): PROCALCITON, LATICACIDVEN in the last 168 hours.  No results found for this or any previous visit (from the past 240 hour(s)).       Radiology Studies: No results found.      Scheduled Meds:  vitamin C  500 mg Per Tube Daily   chlorhexidine  15 mL Mouth Rinse BID   Chlorhexidine Gluconate Cloth  6 each Topical Daily   enoxaparin (LOVENOX) injection  40 mg Subcutaneous Q24H   feeding supplement (PROSource TF)  45 mL Per Tube Daily   free water  60 mL Per Tube Q4H   insulin aspart  0-9 Units Subcutaneous Q4H   mouth rinse  15 mL Mouth Rinse q12n4p   metoprolol tartrate  12.5 mg Per Tube BID   thiothixene  2 mg Per Tube BID   Continuous Infusions:  feeding supplement (OSMOLITE 1.2 CAL) Stopped (03/08/21 0000)     LOS: 31 days    Time spent: 35 minutes with more than 50% on COC    Lynn Ito, MD [facility]   To contact the attending provider between 7A-7P or the covering provider during after hours 7P-7A, please log into the web site [email or web address] and access using universal  password for that web site. If you do not have the  password, please call the hospital operator.  03/10/2021, 9:01 AM

## 2021-03-10 NOTE — H&P (Signed)
Erica Mueller is an 70 y.o. female.   Chief Complaint: Patient herself unable to voice any complaint but denies being in any pain HPI: Patient with schizophrenia who has been catatonic is now receiving electroconvulsive therapy for treatment and is starting to show improvement.  She is vocalizing today and interacting more appropriately and has been eating more.  Past Medical History:  Diagnosis Date   Coronary artery disease    Depression    Diabetes mellitus without complication (HCC)    Elevated lipids    GERD (gastroesophageal reflux disease)    Hypertension    Schizophrenia (HCC)    Syphilis (acquired)     Past Surgical History:  Procedure Laterality Date   COLONOSCOPY     COLONOSCOPY WITH PROPOFOL N/A 09/01/2015   Procedure: COLONOSCOPY WITH PROPOFOL;  Surgeon: Wallace Cullens, MD;  Location: [facility] ENDOSCOPY;  Service: Gastroenterology;  Laterality: N/A;   IR FLUORO RM 30-60 MIN  02/23/2021   IR GASTROSTOMY TUBE MOD SED  02/24/2021   IR REPLC GASTRO/COLONIC TUBE PERCUT W/FLUORO  02/27/2021   TUBAL LIGATION     wisdom teeth removal      Family History  Problem Relation Age of Onset   Cancer Mother    Alcoholism Mother    Bone cancer Father    Heart disease Father    Diabetes Maternal Grandmother    Suicidality Paternal Uncle    Breast cancer Neg Hx    Social History:  reports that she has been smoking cigarettes. She started smoking about 51 years ago. She has a 48.00 pack-year smoking history. She has never used smokeless tobacco. She reports current alcohol use of about 3.0 standard drinks per week. She reports that she does not currently use drugs after having used the following drugs: Marijuana.  Allergies: No Known Allergies  (Not in a hospital admission)   Results for orders placed or performed during the hospital encounter of 02/07/21 (from the past 48 hour(s))  Glucose, capillary     Status: Abnormal   Collection Time: 03/08/21  7:34 PM  Result Value Ref Range    Glucose-Capillary 167 (H) 70 - 99 mg/dL    Comment: Glucose reference range applies only to samples taken after fasting for at least 8 hours.   Comment 1 Notify RN   Glucose, capillary     Status: Abnormal   Collection Time: 03/08/21 11:09 PM  Result Value Ref Range   Glucose-Capillary 183 (H) 70 - 99 mg/dL    Comment: Glucose reference range applies only to samples taken after fasting for at least 8 hours.   Comment 1 Notify RN   Glucose, capillary     Status: Abnormal   Collection Time: 03/09/21  3:50 AM  Result Value Ref Range   Glucose-Capillary 164 (H) 70 - 99 mg/dL    Comment: Glucose reference range applies only to samples taken after fasting for at least 8 hours.   Comment 1 Notify RN   Basic metabolic panel     Status: Abnormal   Collection Time: 03/09/21  6:33 AM  Result Value Ref Range   Sodium 136 135 - 145 mmol/L   Potassium 4.5 3.5 - 5.1 mmol/L   Chloride 101 98 - 111 mmol/L   CO2 28 22 - 32 mmol/L   Glucose, Bld 183 (H) 70 - 99 mg/dL    Comment: Glucose reference range applies only to samples taken after fasting for at least 8 hours.   BUN 16 8 - 23  mg/dL   Creatinine, Ser 3.81 (L) 0.44 - 1.00 mg/dL   Calcium 9.4 8.9 - 82.9 mg/dL   GFR, Estimated >93 >71 mL/min    Comment: (NOTE) Calculated using the CKD-EPI Creatinine Equation (2021)    Anion gap 7 5 - 15    Comment: Performed at [facility], [address] [place], [place] [address]  CBC     Status: Abnormal   Collection Time: 03/09/21  6:33 AM  Result Value Ref Range   WBC 7.0 4.0 - 10.5 K/uL   RBC 3.59 (L) 3.87 - 5.11 MIL/uL   Hemoglobin 11.5 (L) 12.0 - 15.0 g/dL   HCT 93.8 (L) 10.1 - 75.1 %   MCV 95.8 80.0 - 100.0 fL   MCH 32.0 26.0 - 34.0 pg   MCHC 33.4 30.0 - 36.0 g/dL   RDW 02.5 85.2 - 77.8 %   Platelets 335 150 - 400 K/uL   nRBC 0.0 0.0 - 0.2 %    Comment: Performed at [facility], [address] [place], [place] [address]  Glucose, capillary     Status: Abnormal    Collection Time: 03/09/21  8:42 AM  Result Value Ref Range   Glucose-Capillary 228 (H) 70 - 99 mg/dL    Comment: Glucose reference range applies only to samples taken after fasting for at least 8 hours.  Glucose, capillary     Status: Abnormal   Collection Time: 03/09/21 12:16 PM  Result Value Ref Range   Glucose-Capillary 177 (H) 70 - 99 mg/dL    Comment: Glucose reference range applies only to samples taken after fasting for at least 8 hours.  Glucose, capillary     Status: Abnormal   Collection Time: 03/09/21  4:51 PM  Result Value Ref Range   Glucose-Capillary 272 (H) 70 - 99 mg/dL    Comment: Glucose reference range applies only to samples taken after fasting for at least 8 hours.  Glucose, capillary     Status: Abnormal   Collection Time: 03/09/21  8:23 PM  Result Value Ref Range   Glucose-Capillary 142 (H) 70 - 99 mg/dL    Comment: Glucose reference range applies only to samples taken after fasting for at least 8 hours.  Glucose, capillary     Status: Abnormal   Collection Time: 03/09/21 11:57 PM  Result Value Ref Range   Glucose-Capillary 188 (H) 70 - 99 mg/dL    Comment: Glucose reference range applies only to samples taken after fasting for at least 8 hours.   Comment 1 Notify RN    Comment 2 Document in Chart   Glucose, capillary     Status: None   Collection Time: 03/10/21  4:14 AM  Result Value Ref Range   Glucose-Capillary 89 70 - 99 mg/dL    Comment: Glucose reference range applies only to samples taken after fasting for at least 8 hours.  Glucose, capillary     Status: Abnormal   Collection Time: 03/10/21  8:42 AM  Result Value Ref Range   Glucose-Capillary 132 (H) 70 - 99 mg/dL    Comment: Glucose reference range applies only to samples taken after fasting for at least 8 hours.  Glucose, capillary     Status: Abnormal   Collection Time: 03/10/21  3:33 PM  Result Value Ref Range   Glucose-Capillary 149 (H) 70 - 99 mg/dL    Comment: Glucose reference range  applies only to samples taken after fasting for at least 8 hours.   No  results found.  Review of Systems  Unable to perform ROS: Psychiatric disorder   Blood pressure (!) 151/70, pulse 93, temperature 98.2 F (36.8 C), temperature source Temporal, resp. rate (!) 24, height 5\' 2"  (1.575 m), weight 45.9 kg, SpO2 100 %. Physical Exam Vitals and nursing note reviewed.  Constitutional:      Appearance: She is well-developed. She is ill-appearing.  HENT:     Head: Normocephalic and atraumatic.  Eyes:     Conjunctiva/sclera: Conjunctivae normal.     Pupils: Pupils are equal, round, and reactive to light.  Cardiovascular:     Heart sounds: Normal heart sounds.  Pulmonary:     Effort: Pulmonary effort is normal.  Abdominal:     Palpations: Abdomen is soft.  Musculoskeletal:        General: Normal range of motion.     Cervical back: Normal range of motion.  Skin:    General: Skin is warm and dry.  Neurological:     Mental Status: She is alert.  Psychiatric:        Attention and Perception: She is inattentive.        Mood and Affect: Affect is blunt.        Speech: Speech is delayed.        Cognition and Memory: Cognition is impaired.     Assessment/Plan Plan is to continue ECT as long as we are seeing ongoing improvement.  , MD 03/10/2021, 6:04 PM

## 2021-03-10 NOTE — Anesthesia Postprocedure Evaluation (Signed)
Anesthesia Post Note  Patient: Erica Mueller  Procedure(s) Performed: ECT TX  Patient location during evaluation: PACU Anesthesia Type: General Level of consciousness: awake, patient uncooperative and confused Pain management: pain level controlled Vital Signs Assessment: post-procedure vital signs reviewed and stable Respiratory status: spontaneous breathing, nonlabored ventilation and respiratory function stable Cardiovascular status: blood pressure returned to baseline and stable Postop Assessment: no apparent nausea or vomiting Anesthetic complications: no   No notable events documented.   Last Vitals:  Vitals:   03/10/21 1347 03/10/21 1350  BP: (!) 158/68 (!) 151/70  Pulse: 92 93  Resp: (!) 22 (!) 24  Temp: 36.8 C   SpO2: 100% 100%    Last Pain:  Vitals:   03/10/21 1347  TempSrc: Temporal  PainSc:                  Foye Deer

## 2021-03-10 NOTE — Anesthesia Procedure Notes (Signed)
Date/Time: 03/10/2021 1:24 PM Performed by: Stormy Fabian, CRNA Pre-anesthesia Checklist: Patient identified, Emergency Drugs available, Suction available and Patient being monitored Patient Re-evaluated:Patient Re-evaluated prior to induction Oxygen Delivery Method: Circle system utilized Preoxygenation: Pre-oxygenation with 100% oxygen Induction Type: IV induction Ventilation: Mask ventilation without difficulty and Mask ventilation throughout procedure Airway Equipment and Method: Bite block Placement Confirmation: positive ETCO2 Dental Injury: Teeth and Oropharynx as per pre-operative assessment

## 2021-03-10 NOTE — Procedures (Signed)
ECT SERVICES Physician's Interval Evaluation & Treatment Note  Patient Identification: Erica Mueller MRN:  [id number] Date of Evaluation:  03/10/2021 TX #: [id number]  MADRS:   MMSE:   P.E. Findings:  Patient is still showing contractures wasting and limited movement but is becoming more active and has been eating better.  Heart and lungs normal.  Vitals okay.  Psychiatric Interval Note:  Becoming more interactive.  Subjective:  Patient is a 70 y.o. female seen for evaluation for Electroconvulsive Therapy. No complaint  Treatment Summary:   []   Right Unilateral             [x]  Bilateral   % Energy : 1.0 ms 60%   Impedance: 2170 ohms  Seizure Energy Index: 2225 V squared  Postictal Suppression Index: 61%  Seizure Concordance Index: 89%  Medications  Pre Shock: Toradol 30 mg Brevital 50 mg succinylcholine 70 mg  Post Shock:    Seizure Duration: 24 seconds EMG 45 seconds EEG   Comments: Tolerated well with plan to continue with next treatment on Monday  Lungs:  [x]   Clear to auscultation               []  Other:   Heart:    [x]   Regular rhythm             []  irregular rhythm    [x]   Previous H&P reviewed, patient examined and there are NO CHANGES                 []   Previous H&P reviewed, patient examined and there are changes noted.   2226, MD 9/30/20226:06 PM

## 2021-03-10 NOTE — Addendum Note (Signed)
Addendum  created 03/10/21 1512 by Foye Deer, MD   Attestation recorded in [place], Intraprocedure Attestations filed

## 2021-03-11 DIAGNOSIS — F061 Catatonic disorder due to known physiological condition: Secondary | ICD-10-CM | POA: Diagnosis not present

## 2021-03-11 NOTE — Consult Note (Signed)
[facility] Face-to-Face Psychiatry Consult   Reason for Consult: Follow-up consult 70 year old woman with a history of schizophrenia who has been in the hospital with post-COVID catatonia now receiving .  Referring Physician: Marylu Lund Patient Identification: LUCIELLE Mueller MRN:  [id number] Principal Diagnosis: Catatonia Diagnosis:  Principal Problem:   Catatonia Active Problems:   Type 2 diabetes mellitus with hyperglycemia, without long-term current use of insulin (HCC)   Essential hypertension   Schizophrenia (HCC)   Sepsis (HCC)   Pressure injury of skin   Protein-calorie malnutrition, severe   Failure to thrive in adult   Hypernatremia   Hypokalemia   AKI (acute kidney injury) (HCC)   COVID-19 virus infection   PEG tube malfunction (HCC)   Hypophosphatemia   Total Time spent with patient: 30 minutes  Subjective:   Erica Mueller is a 70 y.o. female patient admitted with patient was originally admitted with what turned out to be COVID sepsis.  Now suffering from an extended catatonia.  HPI: Patient seen and chat reviewed.  From the time this provider saw her a couple of weeks ago until now, she has significantly improved and is able to speak in some words.  She was able to move her legs when asked to move them.  Sitting up in bed with brighter affect.  Past Psychiatric History: History of schizophrenia   Past Medical History:  Past Medical History:  Diagnosis Date   Coronary artery disease    Depression    Diabetes mellitus without complication (HCC)    Elevated lipids    GERD (gastroesophageal reflux disease)    Hypertension    Schizophrenia (HCC)    Syphilis (acquired)     Past Surgical History:  Procedure Laterality Date   COLONOSCOPY     COLONOSCOPY WITH PROPOFOL N/A 09/01/2015   Procedure: COLONOSCOPY WITH PROPOFOL;  Surgeon: Wallace Cullens, MD;  Location: [facility] ENDOSCOPY;  Service: Gastroenterology;  Laterality: N/A;   IR FLUORO RM 30-60 MIN  02/23/2021   IR GASTROSTOMY  TUBE MOD SED  02/24/2021   IR REPLC GASTRO/COLONIC TUBE PERCUT W/FLUORO  02/27/2021   TUBAL LIGATION     wisdom teeth removal     Family History:  Family History  Problem Relation Age of Onset   Cancer Mother    Alcoholism Mother    Bone cancer Father    Heart disease Father    Diabetes Maternal Grandmother    Suicidality Paternal Uncle    Breast cancer Neg Hx    Family Psychiatric  History: Father suicide Social History:  Social History   Substance and Sexual Activity  Alcohol Use Yes   Alcohol/week: 3.0 standard drinks   Types: 2 Cans of beer, 1 Shots of liquor per week     Social History   Substance and Sexual Activity  Drug Use Not Currently   Types: Marijuana   Comment: PAST     Social History   Socioeconomic History   Marital status: Divorced    Spouse name: Not on file   Number of children: Not on file   Years of education: Not on file   Highest education level: Not on file  Occupational History   Not on file  Tobacco Use   Smoking status: Every Day    Packs/day: 1.00    Years: 48.00    Pack years: 48.00    Types: Cigarettes    Start date: 12/20/1969   Smokeless tobacco: Never  Vaping Use   Vaping Use: Never used  Substance and Sexual Activity   Alcohol use: Yes    Alcohol/week: 3.0 standard drinks    Types: 2 Cans of beer, 1 Shots of liquor per week   Drug use: Not Currently    Types: Marijuana    Comment: PAST    Sexual activity: Not Currently    Birth control/protection: None  Other Topics Concern   Not on file  Social History Narrative   Not on file   Social Determinants of Health   Financial Resource Strain: Not on file  Food Insecurity: Not on file  Transportation Needs: Not on file  Physical Activity: Not on file  Stress: Not on file  Social Connections: Not on file   Additional Social History:    Allergies:  No Known Allergies  Labs:  Results for orders placed or performed during the hospital encounter of 02/07/21 (from the  past 48 hour(s))  Glucose, capillary     Status: Abnormal   Collection Time: 03/09/21 12:16 PM  Result Value Ref Range   Glucose-Capillary 177 (H) 70 - 99 mg/dL    Comment: Glucose reference range applies only to samples taken after fasting for at least 8 hours.  Glucose, capillary     Status: Abnormal   Collection Time: 03/09/21  4:51 PM  Result Value Ref Range   Glucose-Capillary 272 (H) 70 - 99 mg/dL    Comment: Glucose reference range applies only to samples taken after fasting for at least 8 hours.  Glucose, capillary     Status: Abnormal   Collection Time: 03/09/21  8:23 PM  Result Value Ref Range   Glucose-Capillary 142 (H) 70 - 99 mg/dL    Comment: Glucose reference range applies only to samples taken after fasting for at least 8 hours.  Glucose, capillary     Status: Abnormal   Collection Time: 03/09/21 11:57 PM  Result Value Ref Range   Glucose-Capillary 188 (H) 70 - 99 mg/dL    Comment: Glucose reference range applies only to samples taken after fasting for at least 8 hours.   Comment 1 Notify RN    Comment 2 Document in Chart   Glucose, capillary     Status: None   Collection Time: 03/10/21  4:14 AM  Result Value Ref Range   Glucose-Capillary 89 70 - 99 mg/dL    Comment: Glucose reference range applies only to samples taken after fasting for at least 8 hours.  Glucose, capillary     Status: Abnormal   Collection Time: 03/10/21  8:42 AM  Result Value Ref Range   Glucose-Capillary 132 (H) 70 - 99 mg/dL    Comment: Glucose reference range applies only to samples taken after fasting for at least 8 hours.  Glucose, capillary     Status: Abnormal   Collection Time: 03/10/21  3:33 PM  Result Value Ref Range   Glucose-Capillary 149 (H) 70 - 99 mg/dL    Comment: Glucose reference range applies only to samples taken after fasting for at least 8 hours.  Glucose, capillary     Status: Abnormal   Collection Time: 03/10/21  8:02 PM  Result Value Ref Range   Glucose-Capillary  234 (H) 70 - 99 mg/dL    Comment: Glucose reference range applies only to samples taken after fasting for at least 8 hours.   Comment 1 Notify RN    Comment 2 Document in Chart   Glucose, capillary     Status: Abnormal   Collection Time: 03/10/21 11:30 PM  Result  Value Ref Range   Glucose-Capillary 164 (H) 70 - 99 mg/dL    Comment: Glucose reference range applies only to samples taken after fasting for at least 8 hours.   Comment 1 Notify RN    Comment 2 Document in Chart     Current Facility-Administered Medications  Medication Dose Route Frequency Provider Last Rate Last Admin   acetaminophen (TYLENOL) suppository 650 mg  650 mg Rectal Q4H PRN Jimmye Norman, NP       acetaminophen (TYLENOL) tablet 650 mg  650 mg Oral Q4H PRN Harlon Ditty D, NP       ascorbic acid (VITAMIN C) tablet 500 mg  500 mg Per Tube Daily Charise Killian, MD   500 mg at 03/11/21 1005   chlorhexidine (PERIDEX) 0.12 % solution 15 mL  15 mL Mouth Rinse BID Alberteen Sam, MD   15 mL at 03/11/21 1002   Chlorhexidine Gluconate Cloth 2 % PADS 6 each  6 each Topical Daily Erin Fulling, MD   6 each at 03/11/21 1006   docusate sodium (COLACE) capsule 100 mg  100 mg Oral BID PRN Judithe Modest, NP       enoxaparin (LOVENOX) injection 40 mg  40 mg Subcutaneous Q24H Bari Mantis A, RPH   40 mg at 03/10/21 2021   feeding supplement (OSMOLITE 1.2 CAL) liquid 1,000 mL  1,000 mL Per Tube Continuous Lynn Ito, MD 45 mL/hr at 03/10/21 1701 1,000 mL at 03/10/21 1701   feeding supplement (PROSource TF) liquid 45 mL  45 mL Per Tube Daily Lynn Ito, MD   45 mL at 03/09/21 1054   free water 60 mL  60 mL Per Tube Q4H Lynn Ito, MD   60 mL at 03/11/21 0800   insulin aspart (novoLOG) injection 0-9 Units  0-9 Units Subcutaneous Q4H Alford Highland, MD   2 Units at 03/11/21 1017   MEDLINE mouth rinse  15 mL Mouth Rinse q12n4p Danford, Earl Lites, MD   15 mL at 03/10/21 1600   metoprolol tartrate  (LOPRESSOR) 25 mg/10 mL oral suspension 12.5 mg  12.5 mg Per Tube BID Alford Highland, MD   12.5 mg at 03/11/21 1002   morphine 2 MG/ML injection 2 mg  2 mg Intravenous Q4H PRN Jimmye Norman, NP       ondansetron [facility]) injection 4 mg  4 mg Intravenous Q6H PRN Harlon Ditty D, NP       polyethylene glycol (MIRALAX / GLYCOLAX) packet 17 g  17 g Oral Daily PRN Harlon Ditty D, NP   17 g at 03/08/21 1641   thiothixene (NAVANE) capsule 2 mg  2 mg Per Tube BID Clapacs, Jackquline Denmark, MD   2 mg at 03/11/21 1002    Musculoskeletal: Strength & Muscle Tone: decreased Gait & Station: unable to stand Patient leans: N/A   Psychiatric Specialty Exam: Physical Exam Vitals and nursing note reviewed.  HENT:     Head: Normocephalic and atraumatic.     Mouth/Throat:     Pharynx: Oropharynx is clear.  Eyes:     Pupils: Pupils are equal, round, and reactive to light.  Cardiovascular:     Rate and Rhythm: Normal rate and regular rhythm.  Pulmonary:     Effort: Pulmonary effort is normal.     Breath sounds: Normal breath sounds.  Abdominal:     General: Abdomen is flat.     Palpations: Abdomen is soft.  Musculoskeletal:        General:  Normal range of motion.  Skin:    General: Skin is warm and dry.  Neurological:     General: No focal deficit present.     Mental Status: She is alert. Mental status is at baseline.     Comments: The symptoms come and go but on the whole she looks most of the time like she has pretty significant tardive dyskinesia in her mouth.  Psychiatric:        Attention and Perception: She is inattentive.        Mood and Affect: Mood normal. Affect is blunt.        Speech: Speech is delayed.        Behavior: Behavior is slowed.        Thought Content: Thought content normal. Thought content does not include homicidal or suicidal ideation.        Cognition and Memory: Cognition is impaired. Memory is impaired.    Review of Systems  Constitutional: Negative.    HENT: Negative.    Eyes: Negative.   Respiratory: Negative.    Cardiovascular: Negative.   Gastrointestinal: Negative.   Musculoskeletal: Negative.   Skin: Negative.   Neurological: Negative.   Psychiatric/Behavioral:  Positive for memory loss.    Blood pressure 134/70, pulse 79, temperature 98.1 F (36.7 C), resp. rate 16, height 5\' 2"  (1.575 m), weight 46.5 kg, SpO2 100 %.Body mass index is 18.75 kg/m.  General Appearance: Casual  Eye Contact:  Good  Speech:  Slow  Volume:  Normal  Mood:  Euthymic  Affect:  Congruent  Thought Process:  Coherent and Descriptions of Associations: Intact  Orientation:  Other:  person  Thought Content:  unable to assess, not responding to internal stimuli  Suicidal Thoughts:  No  Homicidal Thoughts:  No  Memory:   unable to assess  Judgement:  Fair  Insight:  unable to assess  Psychomotor Activity:  Decreased  Concentration:  Concentration: Fair and Attention Span: Fair  Recall:  unable to assess  Fund of Knowledge:   unable to assess  Language:  Fair  Akathisia:  No  Handed:  Right  AIMS (if indicated):     Assets:  Leisure Time Resilience Social Support  ADL's:  Impaired  Cognition:  Impaired,  Mild  Sleep:        Physical Exam: Physical Exam Vitals and nursing note reviewed.  HENT:     Head: Normocephalic and atraumatic.     Mouth/Throat:     Pharynx: Oropharynx is clear.  Eyes:     Pupils: Pupils are equal, round, and reactive to light.  Cardiovascular:     Rate and Rhythm: Normal rate and regular rhythm.  Pulmonary:     Effort: Pulmonary effort is normal.     Breath sounds: Normal breath sounds.  Abdominal:     General: Abdomen is flat.     Palpations: Abdomen is soft.  Musculoskeletal:        General: Normal range of motion.  Skin:    General: Skin is warm and dry.  Neurological:     General: No focal deficit present.     Mental Status: She is alert. Mental status is at baseline.     Comments: The symptoms come  and go but on the whole she looks most of the time like she has pretty significant tardive dyskinesia in her mouth.  Psychiatric:        Attention and Perception: She is inattentive.  Mood and Affect: Mood normal. Affect is blunt.        Speech: Speech is delayed.        Behavior: Behavior is slowed.        Thought Content: Thought content normal. Thought content does not include homicidal or suicidal ideation.        Cognition and Memory: Cognition is impaired. Memory is impaired.   Review of Systems  Constitutional: Negative.   HENT: Negative.    Eyes: Negative.   Respiratory: Negative.    Cardiovascular: Negative.   Gastrointestinal: Negative.   Musculoskeletal: Negative.   Skin: Negative.   Neurological: Negative.   Psychiatric/Behavioral:  Positive for memory loss.   Blood pressure 134/70, pulse 79, temperature 98.1 F (36.7 C), resp. rate 16, height 5\' 2"  (1.575 m), weight 46.5 kg, SpO2 100 %. Body mass index is 18.75 kg/m.  Treatment Plan Summary: Catatonia: Continue ECT   Nanine Means, NP 03/11/2021 11:47 AM

## 2021-03-11 NOTE — Progress Notes (Signed)
PROGRESS NOTE    Erica Mueller  BTD:[id number] DOB: 1951-03-09 DOA: 02/07/2021 PCP: [name]    Brief Narrative:  Patient admitted 02/07/2021 with severe sepsis COVID-19 infection.  Past medical history of schizophrenia, hypertension, GERD, type 2 diabetes mellitus and depression and CAD.  The patient had a prolonged hospital course and never regained mental status.  She was treated for electrolyte abnormalities including hypernatremia, hypokalemia and hypophosphatemia.  She had an NG tube for 2 weeks and then a PEG tube was placed on 02/24/2021.  There was a PEG tube malfunction 9/19. IR was able to change over the guidewire.  Seen by psychiatry and restarted on psychiatric medications but still catatonic.  Psychiatry started ECT treatment.  9/27 continue to keep her eyes closed , does not move during my exam. No communication 9/28-Getting ECT today 9/29 no issues.  9/30-ETC today.  10/1 no overnight issues, more interactive today with me.   Assessment & Plan:   Principal Problem:   Catatonia Active Problems:   Type 2 diabetes mellitus with hyperglycemia, without long-term current use of insulin (HCC)   Essential hypertension   Schizophrenia (HCC)   Sepsis (HCC)   Pressure injury of skin   Protein-calorie malnutrition, severe   Failure to thrive in adult   Hypernatremia   Hypokalemia   AKI (acute kidney injury) (HCC)   COVID-19 virus infection   PEG tube malfunction (HCC)   Hypophosphatemia  Schizophrenia with catatonia. No communication initially More interactive 9/30-s/p ETC on 9/28 10/1- ECT on 9/30 Psych following-plan continue ECT     COVID infection. Severe sepsis secondary to aspiration pneumonia. Aspiration pneumonia. Improved On room air  Failure to thrive. Severe protein calorie malnutrition. Continue feeding/TF per RD and speech rec.     DVT prophylaxis: Lovenox Code Status: full Family Communication: None at bedside Disposition  Plan:      Status is: Inpatient   Remains inpatient appropriate because:Ongoing diagnostic testing needed not appropriate for outpatient work up and Inpatient level of care appropriate due to severity of illness   Dispo: The patient is from: Home              Anticipated d/c is to: Home              Patient currently is not medically stable to d/c.              Difficult to place patient No       I/O last 3 completed shifts: In: 757 [P.O.:120; NG/GT:637] Out: 600 [Urine:600] No intake/output data recorded.     Consultants:  psych  Procedures: None  Antimicrobials: None  Subjective: No overnight issues, no sob, or cp   Objective: Vitals:   03/10/21 2331 03/11/21 0425 03/11/21 0500 03/11/21 0733  BP: 137/71 (!) 134/55  134/70  Pulse: 73 73  79  Resp: 18 17  16   Temp: 98.1 F (36.7 C) 97.6 F (36.4 C)  98.1 F (36.7 C)  TempSrc:  Oral    SpO2: 100% 100%  100%  Weight:   46.5 kg   Height:        Intake/Output Summary (Last 24 hours) at 03/11/2021 0902 Last data filed at 03/11/2021 0618 Gross per 24 hour  Intake 757 ml  Output 150 ml  Net 607 ml    Filed Weights   03/09/21 0500 03/10/21 0418 03/11/21 0500  Weight: 46.1 kg 45.9 kg 46.5 kg    Examination: Calm, nad Cta no w/r Rrr s1s2 no  gallops Soft benign +bs No edema Mood and affect appropriate in current setting     Data Reviewed: I have personally reviewed following labs and imaging studies  CBC: Recent Labs  Lab 03/09/21 0633  WBC 7.0  HGB 11.5*  HCT 34.4*  MCV 95.8  PLT 335   Basic Metabolic Panel: Recent Labs  Lab 03/09/21 0633  NA 136  K 4.5  CL 101  CO2 28  GLUCOSE 183*  BUN 16  CREATININE 0.31*  CALCIUM 9.4   GFR: Estimated Creatinine Clearance: 48 mL/min (A) (by C-G formula based on SCr of 0.31 mg/dL (L)). Liver Function Tests: No results for input(s): AST, ALT, ALKPHOS, BILITOT, PROT, ALBUMIN in the last 168 hours. No results for input(s): LIPASE, AMYLASE in the  last 168 hours. No results for input(s): AMMONIA in the last 168 hours. Coagulation Profile: No results for input(s): INR, PROTIME in the last 168 hours. Cardiac Enzymes: No results for input(s): CKTOTAL, CKMB, CKMBINDEX, TROPONINI in the last 168 hours. BNP (last 3 results) No results for input(s): PROBNP in the last 8760 hours. HbA1C: No results for input(s): HGBA1C in the last 72 hours. CBG: Recent Labs  Lab 03/10/21 0414 03/10/21 0842 03/10/21 1533 03/10/21 2002 03/10/21 2330  GLUCAP 89 132* 149* 234* 164*   Lipid Profile: No results for input(s): CHOL, HDL, LDLCALC, TRIG, CHOLHDL, LDLDIRECT in the last 72 hours. Thyroid Function Tests: No results for input(s): TSH, T4TOTAL, FREET4, T3FREE, THYROIDAB in the last 72 hours. Anemia Panel: No results for input(s): VITAMINB12, FOLATE, FERRITIN, TIBC, IRON, RETICCTPCT in the last 72 hours. Sepsis Labs: No results for input(s): PROCALCITON, LATICACIDVEN in the last 168 hours.  No results found for this or any previous visit (from the past 240 hour(s)).       Radiology Studies: No results found.      Scheduled Meds:  vitamin C  500 mg Per Tube Daily   chlorhexidine  15 mL Mouth Rinse BID   Chlorhexidine Gluconate Cloth  6 each Topical Daily   enoxaparin (LOVENOX) injection  40 mg Subcutaneous Q24H   feeding supplement (PROSource TF)  45 mL Per Tube Daily   free water  60 mL Per Tube Q4H   insulin aspart  0-9 Units Subcutaneous Q4H   mouth rinse  15 mL Mouth Rinse q12n4p   metoprolol tartrate  12.5 mg Per Tube BID   thiothixene  2 mg Per Tube BID   Continuous Infusions:  feeding supplement (OSMOLITE 1.2 CAL) 1,000 mL (03/10/21 1701)     LOS: 32 days    Time spent: 35 minutes with more than 50% on COC    Lynn Ito, MD [facility]   To contact the attending provider between 7A-7P or the covering provider during after hours 7P-7A, please log into the web site [email or web address] and access using  universal [place] password for that web site. If you do not have the password, please call the hospital operator.  03/11/2021, 9:02 AM

## 2021-03-12 DIAGNOSIS — F061 Catatonic disorder due to known physiological condition: Secondary | ICD-10-CM | POA: Diagnosis not present

## 2021-03-12 LAB — GLUCOSE, POCT (MANUAL RESULT ENTRY)
POC Glucose: 177 mg/dl — AB (ref 70–99)
POC Glucose: 270 mg/dl — AB (ref 70–99)

## 2021-03-12 NOTE — Progress Notes (Signed)
PROGRESS NOTE    Erica Mueller  TIR:[id number] DOB: 06-18-1950 DOA: 02/07/2021 PCP: [name]    Brief Narrative:  Patient admitted 02/07/2021 with severe sepsis COVID-19 infection.  Past medical history of schizophrenia, hypertension, GERD, type 2 diabetes mellitus and depression and CAD.  The patient had a prolonged hospital course and never regained mental status.  She was treated for electrolyte abnormalities including hypernatremia, hypokalemia and hypophosphatemia.  She had an NG tube for 2 weeks and then a PEG tube was placed on 02/24/2021.  There was a PEG tube malfunction 9/19. IR was able to change over the guidewire.  Seen by psychiatry and restarted on psychiatric medications but still catatonic.  Psychiatry started ECT treatment.  9/27 continue to keep her eyes closed , does not move during my exam. No communication 9/28-Getting ECT today 9/29 no issues.  9/30-ETC today.  10/1 no overnight issues, more interactive today with me.  10/2 no issues overnight  Assessment & Plan:   Principal Problem:   Catatonia Active Problems:   Type 2 diabetes mellitus with hyperglycemia, without long-term current use of insulin (HCC)   Essential hypertension   Schizophrenia (HCC)   Sepsis (HCC)   Pressure injury of skin   Protein-calorie malnutrition, severe   Failure to thrive in adult   Hypernatremia   Hypokalemia   AKI (acute kidney injury) (HCC)   COVID-19 virus infection   PEG tube malfunction (HCC)   Hypophosphatemia  Schizophrenia with catatonia. No communication initially More interactive s/p ETC on 9/28  ECT on 9/30 10/2 psych following plan to continue ECT       COVID infection. Severe sepsis secondary to aspiration pneumonia. Aspiration pneumonia. Improved on room air   Failure to thrive. Severe protein calorie malnutrition. Continue feeding/tube feeding per RD and speech recommendation       DVT prophylaxis: Lovenox Code Status:  full Family Communication: None at bedside Disposition Plan:      Status is: Inpatient   Remains inpatient appropriate because:Ongoing diagnostic testing needed not appropriate for outpatient work up and Inpatient level of care appropriate due to severity of illness   Dispo: The patient is from: Home              Anticipated d/c is to: Home              Patient currently is not medically stable to d/c.              Difficult to place patient No       I/O last 3 completed shifts: In: 637 [NG/GT:637] Out: 450 [Urine:450] No intake/output data recorded.     Consultants:  psych  Procedures: None  Antimicrobials: None  Subjective: Patient is sleepy this AM.  Denies any shortness of breath or chest pain.  Has no complaints  Objective: Vitals:   03/11/21 2141 03/12/21 0452 03/12/21 0500 03/12/21 0807  BP: (!) 137/52 (!) 125/59  (!) 132/50  Pulse: 72 66  72  Resp: 18 16  18   Temp: 98.1 F (36.7 C) 98.5 F (36.9 C)  98.7 F (37.1 C)  TempSrc: Oral Oral    SpO2: 100% 100%  100%  Weight:   45.4 kg   Height:        Intake/Output Summary (Last 24 hours) at 03/12/2021 0926 Last data filed at 03/12/2021 0518 Gross per 24 hour  Intake --  Output 300 ml  Net -300 ml    Filed Weights   03/10/21 0418 03/11/21  0500 03/12/21 0500  Weight: 45.9 kg 46.5 kg 45.4 kg    Examination: Calm, sleepy this am, nad Cta no w/r Rrr s1/s2 no gallop' Soft benign +bs No edema Mood and affect appropriate in current setting     Data Reviewed: I have personally reviewed following labs and imaging studies  CBC: Recent Labs  Lab 03/09/21 0633  WBC 7.0  HGB 11.5*  HCT 34.4*  MCV 95.8  PLT 335   Basic Metabolic Panel: Recent Labs  Lab 03/09/21 0633  NA 136  K 4.5  CL 101  CO2 28  GLUCOSE 183*  BUN 16  CREATININE 0.31*  CALCIUM 9.4   GFR: Estimated Creatinine Clearance: 46.9 mL/min (A) (by C-G formula based on SCr of 0.31 mg/dL (L)). Liver Function Tests: No  results for input(s): AST, ALT, ALKPHOS, BILITOT, PROT, ALBUMIN in the last 168 hours. No results for input(s): LIPASE, AMYLASE in the last 168 hours. No results for input(s): AMMONIA in the last 168 hours. Coagulation Profile: No results for input(s): INR, PROTIME in the last 168 hours. Cardiac Enzymes: No results for input(s): CKTOTAL, CKMB, CKMBINDEX, TROPONINI in the last 168 hours. BNP (last 3 results) No results for input(s): PROBNP in the last 8760 hours. HbA1C: No results for input(s): HGBA1C in the last 72 hours. CBG: Recent Labs  Lab 03/10/21 0414 03/10/21 0842 03/10/21 1533 03/10/21 2002 03/10/21 2330  GLUCAP 89 132* 149* 234* 164*   Lipid Profile: No results for input(s): CHOL, HDL, LDLCALC, TRIG, CHOLHDL, LDLDIRECT in the last 72 hours. Thyroid Function Tests: No results for input(s): TSH, T4TOTAL, FREET4, T3FREE, THYROIDAB in the last 72 hours. Anemia Panel: No results for input(s): VITAMINB12, FOLATE, FERRITIN, TIBC, IRON, RETICCTPCT in the last 72 hours. Sepsis Labs: No results for input(s): PROCALCITON, LATICACIDVEN in the last 168 hours.  No results found for this or any previous visit (from the past 240 hour(s)).       Radiology Studies: No results found.      Scheduled Meds:  vitamin C  500 mg Per Tube Daily   chlorhexidine  15 mL Mouth Rinse BID   Chlorhexidine Gluconate Cloth  6 each Topical Daily   enoxaparin (LOVENOX) injection  40 mg Subcutaneous Q24H   feeding supplement (PROSource TF)  45 mL Per Tube Daily   free water  60 mL Per Tube Q4H   insulin aspart  0-9 Units Subcutaneous Q4H   mouth rinse  15 mL Mouth Rinse q12n4p   metoprolol tartrate  12.5 mg Per Tube BID   thiothixene  2 mg Per Tube BID   Continuous Infusions:  feeding supplement (OSMOLITE 1.2 CAL) 1,000 mL (03/10/21 1701)     LOS: 33 days    Time spent: 35 minutes with more than 50% on COC    Lynn Ito, MD [facility]   To contact the attending  provider between 7A-7P or the covering provider during after hours 7P-7A, please log into the web site [email or web address] and access using universal [place] password for that web site. If you do not have the password, please call the hospital operator.  03/12/2021, 9:26 AM

## 2021-03-13 ENCOUNTER — Other Ambulatory Visit: Payer: Self-pay | Admitting: Psychiatry

## 2021-03-13 DIAGNOSIS — F061 Catatonic disorder due to known physiological condition: Secondary | ICD-10-CM | POA: Diagnosis not present

## 2021-03-13 LAB — GLUCOSE, CAPILLARY
Glucose-Capillary: 150 mg/dL — ABNORMAL HIGH (ref 70–99)
Glucose-Capillary: 184 mg/dL — ABNORMAL HIGH (ref 70–99)
Glucose-Capillary: 188 mg/dL — ABNORMAL HIGH (ref 70–99)
Glucose-Capillary: 220 mg/dL — ABNORMAL HIGH (ref 70–99)
Glucose-Capillary: 155 mg/dL — ABNORMAL HIGH (ref 70–99)
Glucose-Capillary: 162 mg/dL — ABNORMAL HIGH (ref 70–99)
Glucose-Capillary: 180 mg/dL — ABNORMAL HIGH (ref 70–99)
Glucose-Capillary: 158 mg/dL — ABNORMAL HIGH (ref 70–99)
Glucose-Capillary: 166 mg/dL — ABNORMAL HIGH (ref 70–99)
Glucose-Capillary: 177 mg/dL — ABNORMAL HIGH (ref 70–99)
Glucose-Capillary: 177 mg/dL — ABNORMAL HIGH (ref 70–99)
Glucose-Capillary: 197 mg/dL — ABNORMAL HIGH (ref 70–99)
Glucose-Capillary: 211 mg/dL — ABNORMAL HIGH (ref 70–99)
Glucose-Capillary: 259 mg/dL — ABNORMAL HIGH (ref 70–99)
Glucose-Capillary: 270 mg/dL — ABNORMAL HIGH (ref 70–99)
Glucose-Capillary: 165 mg/dL — ABNORMAL HIGH (ref 70–99)
Glucose-Capillary: 182 mg/dL — ABNORMAL HIGH (ref 70–99)

## 2021-03-13 MED ORDER — NEPRO/CARBSTEADY PO LIQD
237.0000 mL | Freq: Two times a day (BID) | ORAL | Status: DC
  Administered 2021-03-13 – 2021-03-26 (×18): 237 mL via ORAL

## 2021-03-13 NOTE — Consult Note (Signed)
[facility] Face-to-Face Psychiatry Consult   Reason for Consult: Follow-up consult 70 year old woman with a history of schizophrenia who has been treated for catatonia Referring Physician: Marylu Lund Patient Identification: Erica Mueller MRN:  [id number] Principal Diagnosis: Catatonia Diagnosis:  Principal Problem:   Catatonia Active Problems:   Type 2 diabetes mellitus with hyperglycemia, without long-term current use of insulin (HCC)   Essential hypertension   Schizophrenia (HCC)   Sepsis (HCC)   Pressure injury of skin   Protein-calorie malnutrition, severe   Failure to thrive in adult   Hypernatremia   Hypokalemia   AKI (acute kidney injury) (HCC)   COVID-19 virus infection   PEG tube malfunction (HCC)   Hypophosphatemia   Total Time spent with patient: 1 hour  Subjective:   Erica SUFFERN is a 70 y.o. female patient admitted with "I am okay".  HPI: Patient seen chart reviewed.  70 year old woman with a history of schizophrenia.  Patient was catatonic for an extended period of time after COVID sepsis.  Patient has received electroconvulsive therapy and has had 4 total treatments with bilateral ECT.  Patient did not have ECT today because I had neglected to put in an n.p.o. order.  On interview this afternoon found patient awake and alert in bed.  Conversant.  Did not remember who I was.  Very slow.  Did not know where she was and did not have any questions to ask.  Apparently however she is eating.  She denies feeling depressed.  Denies suicidal thoughts.  Did not show obvious evidence of psychosis.  Past Psychiatric History: History of schizophrenia  Risk to Self:   Risk to Others:   Prior Inpatient Therapy:   Prior Outpatient Therapy:    Past Medical History:  Past Medical History:  Diagnosis Date   Coronary artery disease    Depression    Diabetes mellitus without complication (HCC)    Elevated lipids    GERD (gastroesophageal reflux disease)    Hypertension     Schizophrenia (HCC)    Syphilis (acquired)     Past Surgical History:  Procedure Laterality Date   COLONOSCOPY     COLONOSCOPY WITH PROPOFOL N/A 09/01/2015   Procedure: COLONOSCOPY WITH PROPOFOL;  Surgeon: Wallace Cullens, MD;  Location: [facility] ENDOSCOPY;  Service: Gastroenterology;  Laterality: N/A;   IR FLUORO RM 30-60 MIN  02/23/2021   IR GASTROSTOMY TUBE MOD SED  02/24/2021   IR REPLC GASTRO/COLONIC TUBE PERCUT W/FLUORO  02/27/2021   TUBAL LIGATION     wisdom teeth removal     Family History:  Family History  Problem Relation Age of Onset   Cancer Mother    Alcoholism Mother    Bone cancer Father    Heart disease Father    Diabetes Maternal Grandmother    Suicidality Paternal Uncle    Breast cancer Neg Hx    Family Psychiatric  History: Father killed himself Social History:  Social History   Substance and Sexual Activity  Alcohol Use Yes   Alcohol/week: 3.0 standard drinks   Types: 2 Cans of beer, 1 Shots of liquor per week     Social History   Substance and Sexual Activity  Drug Use Not Currently   Types: Marijuana   Comment: PAST     Social History   Socioeconomic History   Marital status: Divorced    Spouse name: Not on file   Number of children: Not on file   Years of education: Not on file   Highest  education level: Not on file  Occupational History   Not on file  Tobacco Use   Smoking status: Every Day    Packs/day: 1.00    Years: 48.00    Pack years: 48.00    Types: Cigarettes    Start date: 12/20/1969   Smokeless tobacco: Never  Vaping Use   Vaping Use: Never used  Substance and Sexual Activity   Alcohol use: Yes    Alcohol/week: 3.0 standard drinks    Types: 2 Cans of beer, 1 Shots of liquor per week   Drug use: Not Currently    Types: Marijuana    Comment: PAST    Sexual activity: Not Currently    Birth control/protection: None  Other Topics Concern   Not on file  Social History Narrative   Not on file   Social Determinants of Health    Financial Resource Strain: Not on file  Food Insecurity: Not on file  Transportation Needs: Not on file  Physical Activity: Not on file  Stress: Not on file  Social Connections: Not on file   Additional Social History:    Allergies:  No Known Allergies  Labs:  Results for orders placed or performed during the hospital encounter of 02/07/21 (from the past 48 hour(s))  Glucose, capillary     Status: Abnormal   Collection Time: 03/11/21  9:32 PM  Result Value Ref Range   Glucose-Capillary 197 (H) 70 - 99 mg/dL    Comment: Glucose reference range applies only to samples taken after fasting for at least 8 hours.  Glucose, capillary     Status: Abnormal   Collection Time: 03/12/21 12:48 AM  Result Value Ref Range   Glucose-Capillary 158 (H) 70 - 99 mg/dL    Comment: Glucose reference range applies only to samples taken after fasting for at least 8 hours.  Glucose, capillary     Status: Abnormal   Collection Time: 03/12/21  4:51 AM  Result Value Ref Range   Glucose-Capillary 166 (H) 70 - 99 mg/dL    Comment: Glucose reference range applies only to samples taken after fasting for at least 8 hours.  POCT glucose (manual entry)     Status: Abnormal   Collection Time: 03/12/21  8:23 AM  Result Value Ref Range   POC Glucose 177 (A) 70 - 99 mg/dl  Glucose, capillary     Status: Abnormal   Collection Time: 03/12/21  8:23 AM  Result Value Ref Range   Glucose-Capillary 177 (H) 70 - 99 mg/dL    Comment: Glucose reference range applies only to samples taken after fasting for at least 8 hours.  POCT glucose (manual entry)     Status: Abnormal   Collection Time: 03/12/21 12:00 PM  Result Value Ref Range   POC Glucose 270 (A) 70 - 99 mg/dl  Glucose, capillary     Status: Abnormal   Collection Time: 03/12/21 12:00 PM  Result Value Ref Range   Glucose-Capillary 270 (H) 70 - 99 mg/dL    Comment: Glucose reference range applies only to samples taken after fasting for at least 8 hours.   Glucose, capillary     Status: Abnormal   Collection Time: 03/12/21  4:56 PM  Result Value Ref Range   Glucose-Capillary 162 (H) 70 - 99 mg/dL    Comment: Glucose reference range applies only to samples taken after fasting for at least 8 hours.  Glucose, capillary     Status: Abnormal   Collection Time: 03/12/21  8:06  PM  Result Value Ref Range   Glucose-Capillary 180 (H) 70 - 99 mg/dL    Comment: Glucose reference range applies only to samples taken after fasting for at least 8 hours.   Comment 1 Notify RN    Comment 2 Document in Chart   Glucose, capillary     Status: Abnormal   Collection Time: 03/13/21 12:40 AM  Result Value Ref Range   Glucose-Capillary 220 (H) 70 - 99 mg/dL    Comment: Glucose reference range applies only to samples taken after fasting for at least 8 hours.  Glucose, capillary     Status: Abnormal   Collection Time: 03/13/21  4:49 AM  Result Value Ref Range   Glucose-Capillary 165 (H) 70 - 99 mg/dL    Comment: Glucose reference range applies only to samples taken after fasting for at least 8 hours.  Glucose, capillary     Status: Abnormal   Collection Time: 03/13/21  7:48 AM  Result Value Ref Range   Glucose-Capillary 177 (H) 70 - 99 mg/dL    Comment: Glucose reference range applies only to samples taken after fasting for at least 8 hours.  Glucose, capillary     Status: Abnormal   Collection Time: 03/13/21 12:07 PM  Result Value Ref Range   Glucose-Capillary 211 (H) 70 - 99 mg/dL    Comment: Glucose reference range applies only to samples taken after fasting for at least 8 hours.  Glucose, capillary     Status: Abnormal   Collection Time: 03/13/21  3:37 PM  Result Value Ref Range   Glucose-Capillary 182 (H) 70 - 99 mg/dL    Comment: Glucose reference range applies only to samples taken after fasting for at least 8 hours.    Current Facility-Administered Medications  Medication Dose Route Frequency Provider Last Rate Last Admin   acetaminophen  (TYLENOL) suppository 650 mg  650 mg Rectal Q4H PRN Jimmye Norman, NP       acetaminophen (TYLENOL) tablet 650 mg  650 mg Oral Q4H PRN Judithe Modest, NP       ascorbic acid (VITAMIN C) tablet 500 mg  500 mg Per Tube Daily Charise Killian, MD   500 mg at 03/13/21 0920   chlorhexidine (PERIDEX) 0.12 % solution 15 mL  15 mL Mouth Rinse BID Alberteen Sam, MD   15 mL at 03/13/21 0920   Chlorhexidine Gluconate Cloth 2 % PADS 6 each  6 each Topical Daily Erin Fulling, MD   6 each at 03/13/21 0921   docusate sodium (COLACE) capsule 100 mg  100 mg Oral BID PRN Judithe Modest, NP       enoxaparin (LOVENOX) injection 40 mg  40 mg Subcutaneous Q24H Bari Mantis A, RPH   40 mg at 03/12/21 2019   feeding supplement (NEPRO CARB STEADY) liquid 237 mL  237 mL Oral BID BM Lynn Ito, MD   237 mL at 03/13/21 1400   feeding supplement (OSMOLITE 1.2 CAL) liquid 1,000 mL  1,000 mL Per Tube Continuous Lynn Ito, MD 45 mL/hr at 03/10/21 1701 1,000 mL at 03/10/21 1701   feeding supplement (PROSource TF) liquid 45 mL  45 mL Per Tube Daily Lynn Ito, MD   45 mL at 03/12/21 0930   free water 60 mL  60 mL Per Tube Q4H Lynn Ito, MD   60 mL at 03/13/21 1633   insulin aspart (novoLOG) injection 0-9 Units  0-9 Units Subcutaneous Q4H Alford Highland, MD   2 Units  at 03/13/21 1632   MEDLINE mouth rinse  15 mL Mouth Rinse q12n4p Alberteen Sam, MD   15 mL at 03/13/21 1633   metoprolol tartrate (LOPRESSOR) 25 mg/10 mL oral suspension 12.5 mg  12.5 mg Per Tube BID Alford Highland, MD   12.5 mg at 03/13/21 0920   morphine 2 MG/ML injection 2 mg  2 mg Intravenous Q4H PRN Jimmye Norman, NP       ondansetron [facility]) injection 4 mg  4 mg Intravenous Q6H PRN Judithe Modest, NP       polyethylene glycol (MIRALAX / GLYCOLAX) packet 17 g  17 g Oral Daily PRN Judithe Modest, NP   17 g at 03/08/21 1641   thiothixene (NAVANE) capsule 2 mg  2 mg Per Tube BID Cammie Faulstich, Jackquline Denmark, MD    2 mg at 03/13/21 0920    Musculoskeletal: Strength & Muscle Tone: within normal limits Gait & Station: normal Patient leans: N/A            Psychiatric Specialty Exam:  Presentation  General Appearance:  No data recorded Eye Contact: No data recorded Speech: No data recorded Speech Volume: No data recorded Handedness: No data recorded  Mood and Affect  Mood: No data recorded Affect: No data recorded  Thought Process  Thought Processes: No data recorded Descriptions of Associations:No data recorded Orientation:No data recorded Thought Content:No data recorded History of Schizophrenia/Schizoaffective disorder:No data recorded Duration of Psychotic Symptoms:No data recorded Hallucinations:No data recorded Ideas of Reference:No data recorded Suicidal Thoughts:No data recorded Homicidal Thoughts:No data recorded  Sensorium  Memory: No data recorded Judgment: No data recorded Insight: No data recorded  Executive Functions  Concentration: No data recorded Attention Span: No data recorded Recall: No data recorded Fund of Knowledge: No data recorded Language: No data recorded  Psychomotor Activity  Psychomotor Activity: No data recorded  Assets  Assets: No data recorded  Sleep  Sleep: No data recorded  Physical Exam: Physical Exam Vitals reviewed.  Constitutional:      Appearance: Normal appearance.  HENT:     Head: Normocephalic and atraumatic.     Mouth/Throat:     Pharynx: Oropharynx is clear.  Eyes:     Pupils: Pupils are equal, round, and reactive to light.  Cardiovascular:     Rate and Rhythm: Normal rate and regular rhythm.  Pulmonary:     Effort: Pulmonary effort is normal.     Breath sounds: Normal breath sounds.  Abdominal:     General: Abdomen is flat.     Palpations: Abdomen is soft.  Musculoskeletal:        General: Normal range of motion.  Skin:    General: Skin is warm and dry.  Neurological:      General: No focal deficit present.     Mental Status: She is alert. Mental status is at baseline.  Psychiatric:        Attention and Perception: She is inattentive.        Mood and Affect: Affect is blunt.        Speech: Speech is delayed.        Behavior: Behavior is slowed.        Cognition and Memory: Cognition is impaired. Memory is impaired.   Review of Systems  Constitutional: Negative.   HENT: Negative.    Eyes: Negative.   Respiratory: Negative.    Cardiovascular: Negative.   Gastrointestinal: Negative.   Musculoskeletal: Negative.   Skin: Negative.   Neurological: Negative.  Psychiatric/Behavioral: Negative.    Blood pressure (!) 150/51, pulse 68, temperature 98.2 F (36.8 C), resp. rate 16, height 5\' 2"  (1.575 m), weight 45.4 kg, SpO2 97 %. Body mass index is 18.31 kg/m.  Treatment Plan Summary: Plan patient no longer appears to be catatonic.  Leaves open the question of whether we should continue ECT.  If the catatonia is improved consistently we probably do not need to continue ECT at this time for that.  She certainly has cognitive impairment and I do not want to make that worse.  It is very difficult to tell what her baseline will be as she may have lost some cognitive function during her extended illness as well.  I will see her again tomorrow and make a recommendation but at this point I am thinking we may hold off on ECT as she focuses on eating and physical therapy.  Continue her current antipsychotic orders.  Disposition: No evidence of imminent risk to self or others at present.   Patient does not meet criteria for psychiatric inpatient admission. Discussed crisis plan, support from social network, calling 911, coming to the Emergency Department, and calling Suicide Hotline.  , MD 03/13/2021 5:14 PM

## 2021-03-13 NOTE — Progress Notes (Signed)
PROGRESS NOTE    Erica Mueller  NTI:[id number] DOB: [birth year] DOA: 02/07/2021 PCP: [name]    Brief Narrative:  Patient admitted 02/07/2021 with severe sepsis COVID-19 infection.  Past medical history of schizophrenia, hypertension, GERD, type 2 diabetes mellitus and depression and CAD.  The patient had a prolonged hospital course and never regained mental status.  She was treated for electrolyte abnormalities including hypernatremia, hypokalemia and hypophosphatemia.  She had an NG tube for 2 weeks and then a PEG tube was placed on 02/24/2021.  There was a PEG tube malfunction 9/19. IR was able to change over the guidewire.  Seen by psychiatry and restarted on psychiatric medications but still catatonic.  Psychiatry started ECT treatment.  9/27 continue to keep her eyes closed , does not move during my exam. No communication 9/28-Getting ECT today 9/29 no issues.  9/30-ETC today.  10/1 no overnight issues, more interactive today with me.  10/2 no issues overnight No issues.  Patient wakes up when I ask her questions and answers my questions.  Assessment & Plan:   Principal Problem:   Catatonia Active Problems:   Type 2 diabetes mellitus with hyperglycemia, without long-term current use of insulin (HCC)   Essential hypertension   Schizophrenia (HCC)   Sepsis (HCC)   Pressure injury of skin   Protein-calorie malnutrition, severe   Failure to thrive in adult   Hypernatremia   Hypokalemia   AKI (acute kidney injury) (HCC)   COVID-19 virus infection   PEG tube malfunction (HCC)   Hypophosphatemia  Schizophrenia with catatonia. No communication initially More interactive s/p ETC on 9/28  ECT on 9/30 10/3 psych following plan to continue ECT        COVID infection. Severe sepsis secondary to aspiration pneumonia. Aspiration pneumonia. Improved on room air  Failure to thrive. Severe protein calorie malnutrition. Continue tube feeding per RD and speech  recommendation        DVT prophylaxis: Lovenox Code Status: full Family Communication: None at bedside Disposition Plan:      Status is: Inpatient   Remains inpatient appropriate because:Ongoing diagnostic testing needed not appropriate for outpatient work up and Inpatient level of care appropriate due to severity of illness   Dispo: The patient is from: Home              Anticipated d/c is to: Home              Patient currently is not medically stable to d/c.              Difficult to place patient No  Continues to receive ECT.  Needs to be cleared by psych     I/O last 3 completed shifts: In: -  Out: 1300 [Urine:1300] No intake/output data recorded.     Consultants:  psych  Procedures: None  Antimicrobials: None  Subjective: Denies shortness of breath, chest pain, or abdominal pain  Objective: Vitals:   03/12/21 1539 03/12/21 2007 03/12/21 2210 03/13/21 0452  BP: 120/64 (!) 142/48 (!) 119/56 (!) 141/51  Pulse: 66 74 77 68  Resp: 18 18  17   Temp: 98.3 F (36.8 C) 98.7 F (37.1 C)  98.2 F (36.8 C)  TempSrc:    Oral  SpO2: 98% 100%  96%  Weight:      Height:        Intake/Output Summary (Last 24 hours) at 03/13/2021 0757 Last data filed at 03/13/2021 0100 Gross per 24 hour  Intake --  Output  1000 ml  Net -1000 ml    Filed Weights   03/10/21 0418 03/11/21 0500 03/12/21 0500  Weight: 45.9 kg 46.5 kg 45.4 kg    Examination: NAD, calm CTA no wheeze RRR S1-S2 no gallops Soft benign positive bowel sounds No edema Mood and affect appropriate in current setting     Data Reviewed: I have personally reviewed following labs and imaging studies  CBC: Recent Labs  Lab 03/09/21 0633  WBC 7.0  HGB 11.5*  HCT 34.4*  MCV 95.8  PLT 335   Basic Metabolic Panel: Recent Labs  Lab 03/09/21 0633  NA 136  K 4.5  CL 101  CO2 28  GLUCOSE 183*  BUN 16  CREATININE 0.31*  CALCIUM 9.4   GFR: Estimated Creatinine Clearance: 46.9 mL/min (A)  (by C-G formula based on SCr of 0.31 mg/dL (L)). Liver Function Tests: No results for input(s): AST, ALT, ALKPHOS, BILITOT, PROT, ALBUMIN in the last 168 hours. No results for input(s): LIPASE, AMYLASE in the last 168 hours. No results for input(s): AMMONIA in the last 168 hours. Coagulation Profile: No results for input(s): INR, PROTIME in the last 168 hours. Cardiac Enzymes: No results for input(s): CKTOTAL, CKMB, CKMBINDEX, TROPONINI in the last 168 hours. BNP (last 3 results) No results for input(s): PROBNP in the last 8760 hours. HbA1C: No results for input(s): HGBA1C in the last 72 hours. CBG: Recent Labs  Lab 03/10/21 0414 03/10/21 0842 03/10/21 1533 03/10/21 2002 03/10/21 2330  GLUCAP 89 132* 149* 234* 164*   Lipid Profile: No results for input(s): CHOL, HDL, LDLCALC, TRIG, CHOLHDL, LDLDIRECT in the last 72 hours. Thyroid Function Tests: No results for input(s): TSH, T4TOTAL, FREET4, T3FREE, THYROIDAB in the last 72 hours. Anemia Panel: No results for input(s): VITAMINB12, FOLATE, FERRITIN, TIBC, IRON, RETICCTPCT in the last 72 hours. Sepsis Labs: No results for input(s): PROCALCITON, LATICACIDVEN in the last 168 hours.  No results found for this or any previous visit (from the past 240 hour(s)).       Radiology Studies: No results found.      Scheduled Meds:  vitamin C  500 mg Per Tube Daily   chlorhexidine  15 mL Mouth Rinse BID   Chlorhexidine Gluconate Cloth  6 each Topical Daily   enoxaparin (LOVENOX) injection  40 mg Subcutaneous Q24H   feeding supplement (PROSource TF)  45 mL Per Tube Daily   free water  60 mL Per Tube Q4H   insulin aspart  0-9 Units Subcutaneous Q4H   mouth rinse  15 mL Mouth Rinse q12n4p   metoprolol tartrate  12.5 mg Per Tube BID   thiothixene  2 mg Per Tube BID   Continuous Infusions:  feeding supplement (OSMOLITE 1.2 CAL) 1,000 mL (03/10/21 1701)     LOS: 34 days    Time spent: 35 minutes with more than 50% on  COC    Lynn Ito, MD [facility]   To contact the attending provider between 7A-7P or the covering provider during after hours 7P-7A, please log into the web site [email or web address] and access using universal [place] password for that web site. If you do not have the password, please call the hospital operator.  03/13/2021, 7:57 AM

## 2021-03-14 DIAGNOSIS — F061 Catatonic disorder due to known physiological condition: Secondary | ICD-10-CM | POA: Diagnosis not present

## 2021-03-14 LAB — GLUCOSE, CAPILLARY
Glucose-Capillary: 163 mg/dL — ABNORMAL HIGH (ref 70–99)
Glucose-Capillary: 175 mg/dL — ABNORMAL HIGH (ref 70–99)
Glucose-Capillary: 178 mg/dL — ABNORMAL HIGH (ref 70–99)
Glucose-Capillary: 232 mg/dL — ABNORMAL HIGH (ref 70–99)
Glucose-Capillary: 295 mg/dL — ABNORMAL HIGH (ref 70–99)

## 2021-03-14 NOTE — Evaluation (Signed)
Physical Therapy Evaluation Patient Details Name: Erica Mueller MRN: [id number] DOB: 01-Jan-1951 Today's Date: 03/14/2021  History of Present Illness  Pt is a 70 y/o F with PMH:  schizophrenia, HTN, GERD, type 2 diabetes mellitus,  depression and CAD. Pt presented on 02/07/21 d/t severe sepsis 2/2 COVID-19 infection. PEG tube placed 9/16 and tube changed 9/19. Pt has been catatonic. Phsychiatry started pt back on her baseline psych meds as well as ECT. Pt has become more interactive.   Clinical Impression  Pt received supine in bed, arousable and agreeable to therapy. Co-eval performed with OT due to complexity of pt and limited activity tolerance. Limited communication however this did improve by end of session with a few 1 word answers. Pt was able to follow about 1/3 of the 1-step commands provided however would respond to tactile cueing. MOD Ax2 required for bed mobility and STS. STS was performed x3 reps with ~20 seconds of standing each rep - ambulation deferred due to knee buckling. Pt required assist x2 to stand and assist x1 to maintain standing while holding on RW. For safety, pt does require +2 assist for out of bed mobility. Pt presents with decreased strength and functional endurance, decreased safety awareness and difficulty performing functional tasks. Would benefit from skilled PT to address above deficits and promote optimal return to PLOF.      Recommendations for follow up therapy are one component of a multi-disciplinary discharge planning process, led by the attending physician.  Recommendations may be updated based on patient status, additional functional criteria and insurance authorization.  Follow Up Recommendations SNF;Supervision for mobility/OOB    Equipment Recommendations  Other (comment) (TBD at next venue of care)    Recommendations for Other Services       Precautions / Restrictions Precautions Precautions: Fall Restrictions Weight Bearing Restrictions: No       Mobility  Bed Mobility Overal bed mobility: Needs Assistance Bed Mobility: Supine to Sit;Sit to Supine     Supine to sit: Mod assist;+2 for physical assistance;HOB elevated Sit to supine: Mod assist;+2 for physical assistance   General bed mobility comments: increased time, tactile cues for hand placement.    Transfers Overall transfer level: Needs assistance Equipment used: Rolling walker (2 wheeled);2 person hand held assist Transfers: Sit to/from Stand Sit to Stand: Mod assist;+2 physical assistance         General transfer comment: MOD A to lift. cues to initiate, and for safety. Pt tolerates ~20 secs per trial with UE support of RW. 3 STS completed - 1 rep with +2 HHA and 2 reps with RW and +2 assist. Knee buckling with prolonged standing.  Ambulation/Gait                Stairs            Wheelchair Mobility    Modified Rankin (Stroke Patients Only)       Balance Overall balance assessment: Needs assistance Sitting-balance support: Feet supported;Single extremity supported Sitting balance-Leahy Scale: Poor Sitting balance - Comments: R lateral lean, intermittent need for MIN A. Improved throughout session. Postural control: Right lateral lean Standing balance support: Bilateral upper extremity supported Standing balance-Leahy Scale: Zero Standing balance comment: requires support x2 including b/l UE support via HHA and/or RW and assist x2 to maintain static standing balance with low tolerance.                             Pertinent  Vitals/Pain Pain Assessment: Faces Faces Pain Scale: No hurt    Home Living Family/patient expects to be discharged to:: Skilled nursing facility [facility])                 Additional Comments: pt is poor historian. Seems to gesture and agree that she uses RW at baseline, not wheelchair. Per therapy evaluation , lived with granddtr.    Prior Function Level of Independence: Needs assistance    Gait / Transfers Assistance Needed: Pt is poor historian, but when asked, does not provide much detail. She does gesture towards RW and report that this is what she mostly uses.     Comments: before SNF admission ~4MA, pt was apparently INDEP per chart review     Hand Dominance        Extremity/Trunk Assessment   Upper Extremity Assessment Upper Extremity Assessment: Generalized weakness    Lower Extremity Assessment Lower Extremity Assessment: Generalized weakness (unable to formally assess due to difficulty with command following)    Cervical / Trunk Assessment Cervical / Trunk Assessment: Kyphotic  Communication   Communication: Expressive difficulties;Receptive difficulties (mostly echolalia or rote communication, very limited volitional phrases.)  Cognition Arousal/Alertness: Lethargic;Awake/alert (waxes/wanes) Behavior During Therapy: Flat affect Overall Cognitive Status: No family/caregiver present to determine baseline cognitive functioning                                 General Comments: Pt able to follow a quarter to a third of simple one step commands throughout sesison. She has waxing/waning wakefullness/attn to task. She does appear to perk up with mobilization and becomes more conversive, although still with limited words and phrases and limited appropriate conversation engagement. Pt able to state her last name.      General Comments      Exercises Other Exercises Other Exercises: OT ed re: importance of OOB Activity, pt with minimal reception   Assessment/Plan    PT Assessment Patient needs continued PT services  PT Problem List Decreased strength;Decreased mobility;Decreased safety awareness;Decreased activity tolerance;Decreased balance       PT Treatment Interventions DME instruction;Therapeutic activities;Gait training;Therapeutic exercise;Patient/family education;Balance training;Functional mobility training;Neuromuscular  re-education    PT Goals (Current goals can be found in the Care Plan section)  Acute Rehab PT Goals Patient Stated Goal: none stated PT Goal Formulation: Patient unable to participate in goal setting    Frequency Min 2X/week   Barriers to discharge        Co-evaluation   Reason for Co-Treatment: Complexity of the patient's impairments (multi-system involvement);Necessary to address cognition/behavior during functional activity;For patient/therapist safety;To address functional/ADL transfers PT goals addressed during session: Mobility/safety with mobility;Balance OT goals addressed during session: ADL's and self-care;Proper use of Adaptive equipment and DME       AM-PAC PT "6 Clicks" Mobility  Outcome Measure Help needed turning from your back to your side while in a flat bed without using bedrails?: A Lot Help needed moving from lying on your back to sitting on the side of a flat bed without using bedrails?: A Lot Help needed moving to and from a bed to a chair (including a wheelchair)?: A Lot Help needed standing up from a chair using your arms (e.g., wheelchair or bedside chair)?: A Lot Help needed to walk in hospital room?: A Lot Help needed climbing 3-5 steps with a railing? : Total 6 Click Score: 11    End of Session Equipment Utilized  During Treatment: Gait belt Activity Tolerance: Patient tolerated treatment well;Patient limited by fatigue Patient left: in bed;with call bell/phone within reach;with bed alarm set Nurse Communication: Mobility status PT Visit Diagnosis: Unsteadiness on feet (R26.81);Other abnormalities of gait and mobility (R26.89);Muscle weakness (generalized) (M62.81);Difficulty in walking, not elsewhere classified (R26.2)    Time: 2409-7353 PT Time Calculation (min) (ACUTE ONLY): 20 min   Charges:   PT Evaluation $PT Eval Moderate Complexity: 1 Mod          Miyani Cronic PT, DPT 03/14/21 5:28 PM [phone]   Lavenia Atlas 03/14/2021,  5:19 PM

## 2021-03-14 NOTE — Progress Notes (Signed)
Nutrition Follow-up  DOCUMENTATION CODES:  Severe malnutrition in context of social or environmental circumstances  INTERVENTION:  Continue diet as ordered per SLP, advance as tolerated Nursing staff to assist with meal set-up and feeding Nursing to record meal intake percentages to assess oral intake. Recommend continuing enteral feeds via PEG. Monitor for ability to adjust to nocturnal feeds Osmolite 1.2 @ 4m/h (1.08L/d) and 1 packet of prosource (40kcal and 11g of protein) 671mfree water q4h Regimen will provide 1336 kcal, 71g of protein, and 124626mf free water (TF+flush) Will initiate 48-hour kcal count to determine amount of oral intake pt is consuming.  NUTRITION DIAGNOSIS:  Severe Malnutrition related to social / environmental circumstances as evidenced by severe fat depletion, severe muscle depletion. -ongoing  GOAL:  Patient will meet greater than or equal to 90% of their needs - met with TF  MONITOR:  Labs, Weight trends, TF tolerance, Skin, I & O's  REASON FOR ASSESSMENT:  Malnutrition Screening Tool    ASSESSMENT:  [age]o. female with medical history of HTN, DM, schizophrenia, depression, CAD, GERD, recent COVID 19 50d HLD who is admitted with aspiration PNA and sepsis.  9/16 PEG placed 9/20 g-tube exchanged due to malfunction 9/21 ECT initiated  9/26 diet advanced per SLP  Pt resting in bed at the time of assessment. Sleeping, but did wake when name was called. Pt reports that she is feeling "fine today." Does respond to all questions that were asked, but is unable to recall information about intake i.e. unsure if she ate breakfast, doesn't know if she like nutrition supplements. Severe muscle and fat deficits remain on exam today   Will initiate a 48 hour kcal count for pt as meal intake has not been documented in flowsheet and pt is unable to recall. Discussed with RN  Average Meal Intake: 9/26-10/3: 63% intake x 2 recorded meals  Nutritionally  Relevant Medications: Scheduled Meds:  vitamin C  500 mg Per Tube Daily   feeding supplement (NEPRO CARB STEADY)  237 mL Oral BID BM   feeding supplement (PROSource TF)  45 mL Per Tube Daily   free water  60 mL Per Tube Q4H   insulin aspart  0-9 Units Subcutaneous Q4H   Continuous Infusions:  feeding supplement (OSMOLITE 1.2 CAL) 1,000 mL (03/10/21 1701)   PRN Meds: docusate sodium, ondansetron, polyethylene glycol  Labs Reviewed: SBG ranges from 163-295 mg/dL over the last 24 hours HgbA1c: 7/9% (8/30)  NUTRITION - FOCUSED PHYSICAL EXAM: Flowsheet Row Most Recent Value  Orbital Region Moderate depletion  Upper Arm Region Severe depletion  Thoracic and Lumbar Region Severe depletion  Buccal Region Moderate depletion  Temple Region Moderate depletion  Clavicle Bone Region Moderate depletion  Clavicle and Acromion Bone Region Severe depletion  Scapular Bone Region Severe depletion  Dorsal Hand Moderate depletion  Patellar Region Severe depletion  Anterior Thigh Region Severe depletion  Posterior Calf Region Moderate depletion  Edema (RD Assessment) None  Hair Reviewed  Eyes Reviewed  Mouth Reviewed  Skin Reviewed  Nails Reviewed   Diet Order:   Diet Order             DIET - DYS 1 Room service appropriate? Yes; Fluid consistency: Nectar Thick  Diet effective now                  EDUCATION NEEDS:  No education needs have been identified at this time  Skin:  Skin Assessment: Skin Integrity Issues: Skin Integrity Issues:: Stage II  Stage II: sacrum  Last BM:  10/3 - type 7  Height:  Ht Readings from Last 1 Encounters:  03/03/21 5' 2"  (1.575 m)   Weight:  Wt Readings from Last 1 Encounters:  03/14/21 45.8 kg   Ideal Body Weight:  50 kg  BMI:  Body mass index is 18.47 kg/m.  Estimated Nutritional Needs:  Kcal:  1500-1700 kcal/d Protein:  75-85 g/d Fluid:  1.2-1.4L/day  Ranell Patrick, RD, LDN Clinical Dietitian Pager on Amion

## 2021-03-14 NOTE — TOC Progression Note (Signed)
Transition of Care [facility]) - Progression Note    Patient Details  Name: Erica Mueller MRN: [id number] Date of Birth: [birth year]  [facility] CM/SW Contact  Barrie Dunker, RN Phone Number: 03/14/2021, 9:58 AM  Clinical Narrative:   DC plan is to return to [facility] where she is a long term resident ECT will be geld off for now, She will work with PT and OT, TOC to continue to monitor for needs and assist with DC plan    Expected Discharge Plan: Long Term Nursing Home Barriers to Discharge: Continued Medical Work up  Expected Discharge Plan and Services Expected Discharge Plan: Long Term Nursing Home In-house Referral: Clinical Social Work   Post Acute Care Choice: Nursing Home Living arrangements for the past 2 months:  ([facility] Long Term Care Facility)                                       Social Determinants of Health (SDOH) Interventions    Readmission Risk Interventions No flowsheet data found.

## 2021-03-14 NOTE — Progress Notes (Signed)
PROGRESS NOTE    Erica Mueller  XBM:[id number] DOB: [birth year] DOA: 02/07/2021 PCP: [name]    Brief Narrative:  Patient admitted 02/07/2021 with severe sepsis COVID-19 infection.  Past medical history of schizophrenia, hypertension, GERD, type 2 diabetes mellitus and depression and CAD.  The patient had a prolonged hospital course and never regained mental status.  She was treated for electrolyte abnormalities including hypernatremia, hypokalemia and hypophosphatemia.  She had an NG tube for 2 weeks and then a PEG tube was placed on 02/24/2021.  There was a PEG tube malfunction 9/19. IR was able to change over the guidewire.  Seen by psychiatry and restarted on psychiatric medications but still catatonic.  Psychiatry started ECT treatment.  Dr. Teressa Lower week 9/27-10/4: Pt underwent ECT x2. Improving catatonia. As of 10/3 psychiatry said hold off on ECT as she will focus on eating and physical therapy PT OT orderedPsych will reevaluate today for further decision/recommendations/ECT's .   Assessment & Plan:   Principal Problem:   Catatonia Active Problems:   Type 2 diabetes mellitus with hyperglycemia, without long-term current use of insulin (HCC)   Essential hypertension   Schizophrenia (HCC)   Sepsis (HCC)   Pressure injury of skin   Protein-calorie malnutrition, severe   Failure to thrive in adult   Hypernatremia   Hypokalemia   AKI (acute kidney injury) (HCC)   COVID-19 virus infection   PEG tube malfunction (HCC)   Hypophosphatemia  Schizophrenia with catatonia. No communication initially More interactive s/p ETC on 9/28  ECT on 9/30 10/4 psychiatry following.  Recommended holding off on ECT at this time and will reevaluate today Continue her current antipsychotic orders Focus on her eating PT OT consulted       COVID infection. Severe sepsis secondary to aspiration pneumonia. Aspiration pneumonia. Improved on room air  Failure to thrive. Severe  protein calorie malnutrition. Continue tube feeding per RD and speech recommendation  Ck am labs   DVT prophylaxis: Lovenox Code Status: full Family Communication: None at bedside Disposition Plan:      Status is: Inpatient   Remains inpatient appropriate because:Ongoing diagnostic testing needed not appropriate for outpatient work up and Inpatient level of care appropriate due to severity of illness   Dispo: The patient is from: Home              Anticipated d/c is to: Home              Patient currently is not medically stable to d/c.              Difficult to place patient No  Needs to be cleared by psych     I/O last 3 completed shifts: In: -  Out: 600 [Urine:600] No intake/output data recorded.     Consultants:  psych  Procedures: None  Antimicrobials: None  Subjective: Denies shortness of breath or any complaints.  She reports "doing well "  Objective: Vitals:   03/13/21 1944 03/14/21 0410 03/14/21 0624 03/14/21 0739  BP: (!) 145/60  (!) 138/51 (!) 147/55  Pulse: 93  (!) 56 66  Resp: 16  17 16   Temp: 98.6 F (37 C)  98.2 F (36.8 C) 98 F (36.7 C)  TempSrc:      SpO2: 100%  97% 100%  Weight:  45.8 kg    Height:       No intake or output data in the 24 hours ending 03/14/21 0816   Filed Weights   03/11/21 0500  03/12/21 0500 03/14/21 0410  Weight: 46.5 kg 45.4 kg 45.8 kg    Examination: NAD, calm CTA no wheeze rales Regular S1-S2 no gallops Soft benign positive bowel sounds No edema Mood and affect appropriate in current setting   Data Reviewed: I have personally reviewed following labs and imaging studies  CBC: Recent Labs  Lab 03/09/21 0633  WBC 7.0  HGB 11.5*  HCT 34.4*  MCV 95.8  PLT 335   Basic Metabolic Panel: Recent Labs  Lab 03/09/21 0633  NA 136  K 4.5  CL 101  CO2 28  GLUCOSE 183*  BUN 16  CREATININE 0.31*  CALCIUM 9.4   GFR: Estimated Creatinine Clearance: 47.3 mL/min (A) (by C-G formula based on SCr of  0.31 mg/dL (L)). Liver Function Tests: No results for input(s): AST, ALT, ALKPHOS, BILITOT, PROT, ALBUMIN in the last 168 hours. No results for input(s): LIPASE, AMYLASE in the last 168 hours. No results for input(s): AMMONIA in the last 168 hours. Coagulation Profile: No results for input(s): INR, PROTIME in the last 168 hours. Cardiac Enzymes: No results for input(s): CKTOTAL, CKMB, CKMBINDEX, TROPONINI in the last 168 hours. BNP (last 3 results) No results for input(s): PROBNP in the last 8760 hours. HbA1C: No results for input(s): HGBA1C in the last 72 hours. CBG: Recent Labs  Lab 03/13/21 0040 03/13/21 0449 03/13/21 0748 03/13/21 1207 03/13/21 1537  GLUCAP 220* 165* 177* 211* 182*   Lipid Profile: No results for input(s): CHOL, HDL, LDLCALC, TRIG, CHOLHDL, LDLDIRECT in the last 72 hours. Thyroid Function Tests: No results for input(s): TSH, T4TOTAL, FREET4, T3FREE, THYROIDAB in the last 72 hours. Anemia Panel: No results for input(s): VITAMINB12, FOLATE, FERRITIN, TIBC, IRON, RETICCTPCT in the last 72 hours. Sepsis Labs: No results for input(s): PROCALCITON, LATICACIDVEN in the last 168 hours.  No results found for this or any previous visit (from the past 240 hour(s)).       Radiology Studies: No results found.      Scheduled Meds:  vitamin C  500 mg Per Tube Daily   chlorhexidine  15 mL Mouth Rinse BID   Chlorhexidine Gluconate Cloth  6 each Topical Daily   enoxaparin (LOVENOX) injection  40 mg Subcutaneous Q24H   feeding supplement (NEPRO CARB STEADY)  237 mL Oral BID BM   feeding supplement (PROSource TF)  45 mL Per Tube Daily   free water  60 mL Per Tube Q4H   insulin aspart  0-9 Units Subcutaneous Q4H   mouth rinse  15 mL Mouth Rinse q12n4p   metoprolol tartrate  12.5 mg Per Tube BID   thiothixene  2 mg Per Tube BID   Continuous Infusions:  feeding supplement (OSMOLITE 1.2 CAL) 1,000 mL (03/10/21 1701)     LOS: 35 days    Time spent: 35  minutes with more than 50% on COC    Lynn Ito, MD [facility]   To contact the attending provider between 7A-7P or the covering provider during after hours 7P-7A, please log into the web site [email or web address] and access using universal [place] password for that web site. If you do not have the password, please call the hospital operator.  03/14/2021, 8:16 AM

## 2021-03-14 NOTE — Evaluation (Signed)
Occupational Therapy Evaluation Patient Details Name: Erica Mueller MRN: [id number] DOB: 01/20/1951 Today's Date: 03/14/2021   History of Present Illness Pt is a 70 y/o F with PMH:  schizophrenia, HTN, GERD, type 2 diabetes mellitus,  depression and CAD. Pt presented on 02/07/21 d/t severe sepsis 2/2 COVID-19 infection. PEG tube placed 9/16 and tube changed 9/19. Pt has been catatonic. Phsychiatry started pt back on her baseline psych meds as well as ECT. Pt has become more interactive.   Clinical Impression   Pt seen for OT evaluation this date in setting of acute hospitalization d/t sepsis and prolonged hospitalization with complications including catatonia. Pt is poor historian d/t current cognitive status, but she is able to answer some questions and she is somewhat conversational at different points in the session (although mostly rote phrases or echoing therapists's speech). She reports using a walker for fxl mobility. Pt able to come to EOB sitting with increased time, cueing and MOD A +2 with HOB elevated and cues to use bed rails. Pt requires MOD A +2 to CTS with hand held assist and attempted with walked as well. Pt requires MAX/TOTAL A for LB dressing and peri care tasks d/t weakness as well as cognitive status. She requires cues to engage/attn to task throughout and her participation waxes and wanes. Pt left with bed alarm set, all needs met and in reach. Will continue to follow. Anticipate pt is appropriate to d/c to SNF upon d/c d/t extensive need for assist to be safe while performing ADLs.      Recommendations for follow up therapy are one component of a multi-disciplinary discharge planning process, led by the attending physician.  Recommendations may be updated based on patient status, additional functional criteria and insurance authorization.   Follow Up Recommendations  SNF    Equipment Recommendations  Other (comment) (defer)    Recommendations for Other Services        Precautions / Restrictions Precautions Precautions: Fall Restrictions Weight Bearing Restrictions: No      Mobility Bed Mobility Overal bed mobility: Needs Assistance Bed Mobility: Supine to Sit;Sit to Supine     Supine to sit: Mod assist;+2 for physical assistance;HOB elevated Sit to supine: Mod assist;+2 for physical assistance   General bed mobility comments: increased time, tactile cues for hand placement.    Transfers Overall transfer level: Needs assistance Equipment used: Rolling walker (2 wheeled);2 person hand held assist Transfers: Sit to/from Stand Sit to Stand: Mod assist;+2 physical assistance         General transfer comment: cues to initiate, and for safety. Pt tolerates ~20 secs per trial. 2 trials completed    Balance Overall balance assessment: Needs assistance Sitting-balance support: Feet supported;Single extremity supported Sitting balance-Leahy Scale: Poor Sitting balance - Comments: R lateral lean, intermittent need for MIN A Postural control: Right lateral lean   Standing balance-Leahy Scale: Zero Standing balance comment: requires support x2 including b/l UE support and assist x2 to maintain static standing balance with low tolerance.                           ADL either performed or assessed with clinical judgement   ADL                                         General ADL Comments: requires MIN/MOD A for seated  UB ADLs and verbal/tactile cues for initiation, sequening and termination. Pt requires MAX/TOTAL A for LB ADLs inlcuding peri care (MAX A +2 for standing peri care as one person has to provide balance support while second person assists with actual ADL task. PEri care and toileting tasks started in standing, but pt with limited tolerance and reaminder completed bed level with MOD/MAX A.     Vision Patient Visual Report: No change from baseline Additional Comments: difficult to formally assess d/t  cognition     Perception     Praxis      Pertinent Vitals/Pain Pain Assessment: Faces Faces Pain Scale: No hurt     Hand Dominance     Extremity/Trunk Assessment Upper Extremity Assessment Upper Extremity Assessment: Generalized weakness   Lower Extremity Assessment Lower Extremity Assessment: Generalized weakness       Communication Communication Communication: Expressive difficulties;Receptive difficulties (mostly echolalia or rote communication, very limited volitional phrases.)   Cognition Arousal/Alertness: Lethargic;Awake/alert (waxes/wanes) Behavior During Therapy: Flat affect Overall Cognitive Status: No family/caregiver present to determine baseline cognitive functioning                                 General Comments: Pt able to follow a quarter to a third of simple one step commands throughout sesison. She has waxing/waning wakefullness/attn to task. She does appear to perk up with mobilization and becomes more conversive, although still with limited words and phrases and limited appropriate conversation engagement   General Comments       Exercises Other Exercises Other Exercises: OT ed re: importance of OOB Activity, pt with minimal reception   Shoulder Instructions      Home Living Family/patient expects to be discharged to:: Skilled nursing facility [facility])                                 Additional Comments: pt is poor historian. Seems to gesture and agree that she uses RW at baseline, not wheelchair. Per therapy evaluation 4MA, lived with granddtr.      Prior Functioning/Environment Level of Independence: Needs assistance  Gait / Transfers Assistance Needed: Pt is poor historian, but when asked, does not provide much detail. She does gesture towards RW and report that this is what she mostly uses.     Comments: before SNF admission ~4MA, pt was apparently INDEP per chart review        OT Problem List: Decreased  strength;Decreased activity tolerance;Decreased cognition;Impaired balance (sitting and/or standing)      OT Treatment/Interventions: Self-care/ADL training;Therapeutic exercise;Therapeutic activities    OT Goals(Current goals can be found in the care plan section) Acute Rehab OT Goals Patient Stated Goal: none stated OT Goal Formulation: Patient unable to participate in goal setting Time For Goal Achievement: 03/28/21 Potential to Achieve Goals: Fair ADL Goals Pt Will Perform Grooming: with set-up;with supervision;sitting Pt Will Perform Lower Body Dressing: with min guard assist;with min assist;sitting/lateral leans Pt Will Transfer to Toilet: with min assist;with mod assist;stand pivot transfer;bedside commode Pt/caregiver will Perform Home Exercise Program: Increased strength;Both right and left upper extremity;With minimal assist  OT Frequency: Min 1X/week   Barriers to D/C:            Co-evaluation PT/OT/SLP Co-Evaluation/Treatment: Yes Reason for Co-Treatment: Complexity of the patient's impairments (multi-system involvement);Necessary to address cognition/behavior during functional activity;For patient/therapist safety;To address functional/ADL transfers PT goals  addressed during session: Mobility/safety with mobility;Balance OT goals addressed during session: ADL's and self-care;Proper use of Adaptive equipment and DME      AM-PAC OT "6 Clicks" Daily Activity     Outcome Measure Help from another person eating meals?: A Little Help from another person taking care of personal grooming?: A Little Help from another person toileting, which includes using toliet, bedpan, or urinal?: Total Help from another person bathing (including washing, rinsing, drying)?: A Lot Help from another person to put on and taking off regular upper body clothing?: A Lot Help from another person to put on and taking off regular lower body clothing?: Total 6 Click Score: 12   End of Session  Equipment Utilized During Treatment: Gait belt;Rolling walker Nurse Communication: Mobility status  Activity Tolerance: Patient tolerated treatment well Patient left: in bed;with call bell/phone within reach;with bed alarm set  OT Visit Diagnosis: Unsteadiness on feet (R26.81);History of falling (Z91.81)                Time: 3710-6269 OT Time Calculation (min): 15 min Charges:  OT General Charges $OT Visit: 1 Visit OT Evaluation $OT Eval Moderate Complexity: Odell, MS, OTR/L ascom [phone] 03/14/21, 3:17 PM

## 2021-03-15 ENCOUNTER — Inpatient Hospital Stay: Payer: Medicare Other | Admitting: Certified Registered Nurse Anesthetist

## 2021-03-15 ENCOUNTER — Inpatient Hospital Stay: Admit: 2021-03-15 | Discharge: 2021-03-15 | Disposition: A | Discharge status: Home / Self Care | Payer: Medicare Other

## 2021-03-15 DIAGNOSIS — F061 Catatonic disorder due to known physiological condition: Secondary | ICD-10-CM | POA: Diagnosis not present

## 2021-03-15 LAB — GLUCOSE, CAPILLARY
Glucose-Capillary: 154 mg/dL — ABNORMAL HIGH (ref 70–99)
Glucose-Capillary: 167 mg/dL — ABNORMAL HIGH (ref 70–99)
Glucose-Capillary: 212 mg/dL — ABNORMAL HIGH (ref 70–99)
Glucose-Capillary: 321 mg/dL — ABNORMAL HIGH (ref 70–99)
Glucose-Capillary: 249 mg/dL — ABNORMAL HIGH (ref 70–99)
Glucose-Capillary: 251 mg/dL — ABNORMAL HIGH (ref 70–99)

## 2021-03-15 MED ORDER — OSMOLITE 1.2 CAL PO LIQD
1000.0000 mL | ORAL | Status: DC
  Administered 2021-03-15 – 2021-03-28 (×8): 1000 mL

## 2021-03-15 NOTE — Consult Note (Signed)
[facility] Face-to-Face Psychiatry Consult   Reason for Consult: Follow-up consult for 70 year old woman recovering from what appeared to be a catatonia Referring Physician: Allena Katz Patient Identification: Erica Mueller MRN:  [id number] Principal Diagnosis: Catatonia Diagnosis:  Principal Problem:   Catatonia Active Problems:   Type 2 diabetes mellitus with hyperglycemia, without long-term current use of insulin (HCC)   Essential hypertension   Schizophrenia (HCC)   Sepsis (HCC)   Pressure injury of skin   Protein-calorie malnutrition, severe   Failure to thrive in adult   Hypernatremia   Hypokalemia   AKI (acute kidney injury) (HCC)   COVID-19 virus infection   PEG tube malfunction (HCC)   Hypophosphatemia   Total Time spent with patient: 30 minutes  Subjective:   Erica Mueller is a 70 y.o. female patient admitted with "I stood up".  HPI: Patient seen chart reviewed.  Today the patient was sitting in the chair out of bed awake.  Interacted pretty appropriately.  Still limited cognition with slow speech but she said that she had had physical therapy today and had tried to stand up.  Not feeling hopeless or depressed.  Denies hallucinations.  Seems to be making progress certainly looks a little stronger today and apparently was able to cooperate with some of the therapy  Past Psychiatric History: Past history of schizophrenia  Risk to Self:   Risk to Others:   Prior Inpatient Therapy:   Prior Outpatient Therapy:    Past Medical History:  Past Medical History:  Diagnosis Date   Coronary artery disease    Depression    Diabetes mellitus without complication (HCC)    Elevated lipids    GERD (gastroesophageal reflux disease)    Hypertension    Schizophrenia (HCC)    Syphilis (acquired)     Past Surgical History:  Procedure Laterality Date   COLONOSCOPY     COLONOSCOPY WITH PROPOFOL N/A 09/01/2015   Procedure: COLONOSCOPY WITH PROPOFOL;  Surgeon: Wallace Cullens, MD;  Location: [facility]  ENDOSCOPY;  Service: Gastroenterology;  Laterality: N/A;   IR FLUORO RM 30-60 MIN  02/23/2021   IR GASTROSTOMY TUBE MOD SED  02/24/2021   IR REPLC GASTRO/COLONIC TUBE PERCUT W/FLUORO  02/27/2021   TUBAL LIGATION     wisdom teeth removal     Family History:  Family History  Problem Relation Age of Onset   Cancer Mother    Alcoholism Mother    Bone cancer Father    Heart disease Father    Diabetes Maternal Grandmother    Suicidality Paternal Uncle    Breast cancer Neg Hx    Family Psychiatric  History: See previous.  Father killed himself Social History:  Social History   Substance and Sexual Activity  Alcohol Use Yes   Alcohol/week: 3.0 standard drinks   Types: 2 Cans of beer, 1 Shots of liquor per week     Social History   Substance and Sexual Activity  Drug Use Not Currently   Types: Marijuana   Comment: PAST     Social History   Socioeconomic History   Marital status: Divorced    Spouse name: Not on file   Number of children: Not on file   Years of education: Not on file   Highest education level: Not on file  Occupational History   Not on file  Tobacco Use   Smoking status: Every Day    Packs/day: 1.00    Years: 48.00    Pack years: 48.00  Types: Cigarettes    Start date: 12/20/1969   Smokeless tobacco: Never  Vaping Use   Vaping Use: Never used  Substance and Sexual Activity   Alcohol use: Yes    Alcohol/week: 3.0 standard drinks    Types: 2 Cans of beer, 1 Shots of liquor per week   Drug use: Not Currently    Types: Marijuana    Comment: PAST    Sexual activity: Not Currently    Birth control/protection: None  Other Topics Concern   Not on file  Social History Narrative   Not on file   Social Determinants of Health   Financial Resource Strain: Not on file  Food Insecurity: Not on file  Transportation Needs: Not on file  Physical Activity: Not on file  Stress: Not on file  Social Connections: Not on file   Additional Social History:     Allergies:  No Known Allergies  Labs:  Results for orders placed or performed during the hospital encounter of 02/07/21 (from the past 48 hour(s))  Glucose, capillary     Status: Abnormal   Collection Time: 03/13/21  8:41 PM  Result Value Ref Range   Glucose-Capillary 232 (H) 70 - 99 mg/dL    Comment: Glucose reference range applies only to samples taken after fasting for at least 8 hours.  Glucose, capillary     Status: Abnormal   Collection Time: 03/13/21 11:58 PM  Result Value Ref Range   Glucose-Capillary 163 (H) 70 - 99 mg/dL    Comment: Glucose reference range applies only to samples taken after fasting for at least 8 hours.  Glucose, capillary     Status: Abnormal   Collection Time: 03/14/21  4:06 AM  Result Value Ref Range   Glucose-Capillary 178 (H) 70 - 99 mg/dL    Comment: Glucose reference range applies only to samples taken after fasting for at least 8 hours.  Glucose, capillary     Status: Abnormal   Collection Time: 03/14/21  7:57 AM  Result Value Ref Range   Glucose-Capillary 175 (H) 70 - 99 mg/dL    Comment: Glucose reference range applies only to samples taken after fasting for at least 8 hours.  Glucose, capillary     Status: Abnormal   Collection Time: 03/14/21 11:45 AM  Result Value Ref Range   Glucose-Capillary 295 (H) 70 - 99 mg/dL    Comment: Glucose reference range applies only to samples taken after fasting for at least 8 hours.  Glucose, capillary     Status: Abnormal   Collection Time: 03/14/21  8:03 PM  Result Value Ref Range   Glucose-Capillary 251 (H) 70 - 99 mg/dL    Comment: Glucose reference range applies only to samples taken after fasting for at least 8 hours.   Comment 1 Notify RN   Glucose, capillary     Status: Abnormal   Collection Time: 03/14/21  8:25 PM  Result Value Ref Range   Glucose-Capillary 249 (H) 70 - 99 mg/dL    Comment: Glucose reference range applies only to samples taken after fasting for at least 8 hours.  Glucose,  capillary     Status: Abnormal   Collection Time: 03/15/21 12:24 AM  Result Value Ref Range   Glucose-Capillary 154 (H) 70 - 99 mg/dL    Comment: Glucose reference range applies only to samples taken after fasting for at least 8 hours.  Glucose, capillary     Status: Abnormal   Collection Time: 03/15/21  3:40 AM  Result Value Ref Range   Glucose-Capillary 167 (H) 70 - 99 mg/dL    Comment: Glucose reference range applies only to samples taken after fasting for at least 8 hours.  Glucose, capillary     Status: Abnormal   Collection Time: 03/15/21  8:43 AM  Result Value Ref Range   Glucose-Capillary 212 (H) 70 - 99 mg/dL    Comment: Glucose reference range applies only to samples taken after fasting for at least 8 hours.  Glucose, capillary     Status: Abnormal   Collection Time: 03/15/21 11:54 AM  Result Value Ref Range   Glucose-Capillary 321 (H) 70 - 99 mg/dL    Comment: Glucose reference range applies only to samples taken after fasting for at least 8 hours.    Current Facility-Administered Medications  Medication Dose Route Frequency Provider Last Rate Last Admin   acetaminophen (TYLENOL) suppository 650 mg  650 mg Rectal Q4H PRN Jimmye Norman, NP       acetaminophen (TYLENOL) tablet 650 mg  650 mg Oral Q4H PRN Judithe Modest, NP       ascorbic acid (VITAMIN C) tablet 500 mg  500 mg Per Tube Daily Charise Killian, MD   500 mg at 03/15/21 1047   chlorhexidine (PERIDEX) 0.12 % solution 15 mL  15 mL Mouth Rinse BID Alberteen Sam, MD   15 mL at 03/15/21 1046   Chlorhexidine Gluconate Cloth 2 % PADS 6 each  6 each Topical Daily Erin Fulling, MD   6 each at 03/15/21 1049   docusate sodium (COLACE) capsule 100 mg  100 mg Oral BID PRN Judithe Modest, NP       enoxaparin (LOVENOX) injection 40 mg  40 mg Subcutaneous Q24H Bari Mantis A, RPH   40 mg at 03/14/21 2022   feeding supplement (NEPRO CARB STEADY) liquid 237 mL  237 mL Oral BID BM Lynn Ito, MD    237 mL at 03/15/21 1424   feeding supplement (OSMOLITE 1.2 CAL) liquid 1,000 mL  1,000 mL Per Tube Continuous [name], MD       free water 60 mL  60 mL Per Tube Q4H Lynn Ito, MD   60 mL at 03/15/21 1554   insulin aspart (novoLOG) injection 0-9 Units  0-9 Units Subcutaneous Q4H Alford Highland, MD   3 Units at 03/15/21 1550   MEDLINE mouth rinse  15 mL Mouth Rinse q12n4p Danford, Earl Lites, MD   15 mL at 03/15/21 1554   metoprolol tartrate (LOPRESSOR) 25 mg/10 mL oral suspension 12.5 mg  12.5 mg Per Tube BID Alford Highland, MD   12.5 mg at 03/15/21 1049   morphine 2 MG/ML injection 2 mg  2 mg Intravenous Q4H PRN Jimmye Norman, NP       ondansetron [facility]) injection 4 mg  4 mg Intravenous Q6H PRN Harlon Ditty D, NP       polyethylene glycol (MIRALAX / GLYCOLAX) packet 17 g  17 g Oral Daily PRN Harlon Ditty D, NP   17 g at 03/08/21 1641   thiothixene (NAVANE) capsule 2 mg  2 mg Per Tube BID Paisley Grajeda, Jackquline Denmark, MD   2 mg at 03/15/21 1047    Musculoskeletal: Strength & Muscle Tone: decreased Gait & Station: unsteady Patient leans: N/A            Psychiatric Specialty Exam:  Presentation  General Appearance:  No data recorded Eye Contact: No data recorded Speech: No data recorded Speech Volume:  No data recorded Handedness: No data recorded  Mood and Affect  Mood: No data recorded Affect: No data recorded  Thought Process  Thought Processes: No data recorded Descriptions of Associations:No data recorded Orientation:No data recorded Thought Content:No data recorded History of Schizophrenia/Schizoaffective disorder:No data recorded Duration of Psychotic Symptoms:No data recorded Hallucinations:No data recorded Ideas of Reference:No data recorded Suicidal Thoughts:No data recorded Homicidal Thoughts:No data recorded  Sensorium  Memory: No data recorded Judgment: No data recorded Insight: No data recorded  Executive Functions   Concentration: No data recorded Attention Span: No data recorded Recall: No data recorded Fund of Knowledge: No data recorded Language: No data recorded  Psychomotor Activity  Psychomotor Activity: No data recorded  Assets  Assets: No data recorded  Sleep  Sleep: No data recorded  Physical Exam: Physical Exam Constitutional:      Appearance: Normal appearance.  HENT:     Head: Normocephalic and atraumatic.     Mouth/Throat:     Pharynx: Oropharynx is clear.  Eyes:     Pupils: Pupils are equal, round, and reactive to light.  Cardiovascular:     Rate and Rhythm: Normal rate and regular rhythm.  Pulmonary:     Effort: Pulmonary effort is normal.     Breath sounds: Normal breath sounds.  Abdominal:     General: Abdomen is flat.     Palpations: Abdomen is soft.  Musculoskeletal:        General: Normal range of motion.  Skin:    General: Skin is warm and dry.  Neurological:     General: No focal deficit present.     Mental Status: She is alert. Mental status is at baseline.     Comments: Tardive dyskinesia  Psychiatric:        Attention and Perception: Attention normal.        Mood and Affect: Mood normal. Affect is blunt.        Speech: Speech is delayed.        Behavior: Behavior is slowed.        Thought Content: Thought content normal.        Cognition and Memory: Cognition is impaired. Memory is impaired.   Review of Systems  Constitutional: Negative.   HENT: Negative.    Eyes: Negative.   Respiratory: Negative.    Cardiovascular: Negative.   Gastrointestinal: Negative.   Musculoskeletal: Negative.   Skin: Negative.   Neurological: Negative.   Psychiatric/Behavioral:  Positive for memory loss. Negative for depression, hallucinations, substance abuse and suicidal ideas. The patient is not nervous/anxious and does not have insomnia.   Blood pressure (!) 147/68, pulse 92, temperature 98.8 F (37.1 C), resp. rate 18, height 5\' 2"  (1.575 m), weight  46.2 kg, SpO2 99 %. Body mass index is 18.63 kg/m.  Treatment Plan Summary: Plan patient seems to be making a little progress and not slipping back into catatonia.  The decision to be made is whether to continue ECT and at this point I am going to defer for the moment.  We will reevaluate her tomorrow.  If she is continuing to show improvement with the ability to interact with OT and PT and to eat such that she looks like she is on the road to more recovery there will not really be an indication for ECT as she is not appearing to be depressed.  If she is slipping back into catatonia we will reassess for continued treatment.  Disposition: Supportive therapy provided about ongoing stressors.  Jonny Ruiz  Jakhiya Brower, MD 03/15/2021 6:37 PM

## 2021-03-15 NOTE — Progress Notes (Signed)
Calorie Count Note  48-hour calorie count ordered.  Diet: DYS 1, nectar thick liquids Supplements: Nepro BID  Pt is currently on TF via her PEG and regimen it is providing 1336 kcal, 71g of protein, and 1246m of free water (TF+flush). Which is about 75% of needs. Initially seemed to have fair oral intake after several ECT treatments and diet advancement. TF rate was decreased slightly to optimize oral intake but intake has been inconsistent. Calorie count initiated in order to get a better idea of needs being met orally.    10/4 Dinner: Not documented 10/5 Breakfast: refused meal per NT 10/5 Lunch: Not documented  No oral intake documented over the last 24 hours, will increase TF back to meet 100% of needs as it is unlikely pt is consuming much PO. Will continue calorie count for 1 more day to try and assess intake.   NUTRITION DIAGNOSIS:  Severe Malnutrition related to social / environmental circumstances as evidenced by severe fat depletion, severe muscle depletion.   GOAL:  Patient will meet greater than or equal to 90% of their needs  INTERVENTION:  Continue diet as ordered per SLP, advance as tolerated Nursing staff to assist with meal set-up and feeding Nursing to record meal intake percentages to assess oral intake. Recommend continuing enteral feeds via PEG. Monitor for ability to adjust to nocturnal feeds Osmolite 1.2 @ 664mh (1.44L/d)  6073mree water q4h Regimen will provide 1728 kcal, 80g of protein, and 1541 mL of free water (TF+flush) Continue 48-hour kcal count to determine amount of oral intake pt is consuming.   RacRanell PatrickD, LDN Clinical Dietitian Pager on [place]

## 2021-03-15 NOTE — Progress Notes (Signed)
Occupational Therapy Treatment Patient Details Name: Erica Mueller MRN: [id number] DOB: 1951/03/14 Today's Date: 03/15/2021   History of present illness Pt is a 70 y/o F with PMH:  schizophrenia, HTN, GERD, type 2 diabetes mellitus,  depression and CAD. Pt presented on 02/07/21 d/t severe sepsis 2/2 COVID-19 infection. PEG tube placed 9/16 and tube changed 9/19. Pt has been catatonic. Phsychiatry started pt back on her baseline psych meds as well as ECT. Pt has become more interactive.   OT comments  Pt seen for OT tx this date to f/u re: safety with ADLs/ADL mobility. OT engages pt in sup to sit with MIN/MOD A +2 and extended time and cues. Pt initially with P sitting balance, but progresses to F balance. Pt requires MOD A +2 arm in arm for STS for peri care with MAX/TOTAL A. Pt requires MOD A +2 for SPS to chair. Pt left in chair with all needs met and in reach. Pt slightly less interactive than evaluation yesterday, but still occasionally conversational. Will continue to follow.    Recommendations for follow up therapy are one component of a multi-disciplinary discharge planning process, led by the attending physician.  Recommendations may be updated based on patient status, additional functional criteria and insurance authorization.    Follow Up Recommendations  SNF    Equipment Recommendations  Other (comment) (defer)    Recommendations for Other Services      Precautions / Restrictions Precautions Precautions: Fall Restrictions Weight Bearing Restrictions: No       Mobility Bed Mobility Overal bed mobility: Needs Assistance Bed Mobility: Supine to Sit     Supine to sit: Min assist;Mod assist;+2 for physical assistance;HOB elevated     General bed mobility comments: requires cues to stimulate adn sequence    Transfers Overall transfer level: Needs assistance Equipment used: Rolling walker (2 wheeled);2 person hand held assist Transfers: Sit to/from [employer] Sit to Stand: Mod assist;+2 physical assistance Stand pivot transfers: Mod assist;+2 safety/equipment       General transfer comment: arm in arm technique    Balance Overall balance assessment: Needs assistance Sitting-balance support: Feet supported;Single extremity supported Sitting balance-Leahy Scale: Poor     Standing balance support: Bilateral upper extremity supported Standing balance-Leahy Scale: Zero Standing balance comment: 2p assist to sustain, still with flexion in hips/knees d/t weakness                           ADL either performed or assessed with clinical judgement   ADL Overall ADL's : Needs assistance/impaired     Grooming: Wash/dry face;Oral care;Minimal assistance;Moderate assistance;Bed level;Sitting Grooming Details (indicate cue type and reason): bed level for MIN/MOD A oral care, Sitting EOB for MIN A washing face with MIN A also for static sitting balance Upper Body Bathing: Moderate assistance;Sitting   Lower Body Bathing: Total assistance;Sit to/from stand;+2 for physical assistance Lower Body Bathing Details (indicate cue type and reason): 2p for actual standing and balance aspect, one person required to help pt sustain static stand with knee blocking while second person assists with posterior LB bathing. Pt is able to contribute to anterior LB bathing in sitting with MOD verbal/visual cues. Upper Body Dressing : Minimal assistance;Sitting Upper Body Dressing Details (indicate cue type and reason): to don gown                         Vision Patient Visual Report: No  change from baseline     Perception     Praxis      Cognition                                                Exercises     Shoulder Instructions       General Comments      Pertinent Vitals/ Pain          Home Living                                          Prior Functioning/Environment               Frequency  Min 1X/week        Progress Toward Goals  OT Goals(current goals can now be found in the care plan section)     Acute Rehab OT Goals Patient Stated Goal: none stated OT Goal Formulation: Patient unable to participate in goal setting Time For Goal Achievement: 03/28/21 Potential to Achieve Goals: Fair  Plan      Co-evaluation                 AM-PAC OT "6 Clicks" Daily Activity     Outcome Measure   Help from another person eating meals?: A Little Help from another person taking care of personal grooming?: A Little Help from another person toileting, which includes using toliet, bedpan, or urinal?: Total Help from another person bathing (including washing, rinsing, drying)?: A Lot Help from another person to put on and taking off regular upper body clothing?: A Lot Help from another person to put on and taking off regular lower body clothing?: Total 6 Click Score: 12    End of Session Equipment Utilized During Treatment: Gait belt;Rolling walker  OT Visit Diagnosis: Unsteadiness on feet (R26.81);Other symptoms and signs involving cognitive function   Activity Tolerance Patient tolerated treatment well   Patient Left in bed;with call bell/phone within reach;with bed alarm set   Nurse Communication Mobility status        Time: 0300-9233 OT Time Calculation (min): 26 min  Charges: OT General Charges $OT Visit: 1 Visit OT Treatments $Self Care/Home Management : 8-22 mins $Therapeutic Activity: 8-22 mins  Gerrianne Scale, [place], OTR/L ascom [phone] 03/15/21, 4:34 PM

## 2021-03-15 NOTE — NC FL2 (Signed)
[place] MEDICAID FL2 LEVEL OF CARE SCREENING TOOL     IDENTIFICATION  Patient Name: Erica Mueller Birthdate: June 06, 1951 Sex: female Admission Date (Current Location): 02/07/2021  [place] and [place] Number:  Chiropodist and Address:  [facility], [address], [place], [place] [address]      Provider Number: [id number]  Attending Physician Name and Address:  [name], MD  Relative Name and Phone Number:  Joslyn Devon Brother [phone]    Current Level of Care: Hospital Recommended Level of Care: Skilled Nursing Facility Prior Approval Number:    Date Approved/Denied:   PASRR Number: [id number]  Discharge Plan: SNF    Current Diagnoses: Patient Active Problem List   Diagnosis Date Noted   Hypophosphatemia 03/05/2021   PEG tube malfunction (HCC)    COVID-19 virus infection    AKI (acute kidney injury) (HCC)    Catatonia 02/22/2021   Failure to thrive in adult    Hypernatremia    Hypokalemia    Protein-calorie malnutrition, severe 02/10/2021   Pressure injury of skin 02/09/2021   Sepsis (HCC) 02/07/2021   Acute metabolic encephalopathy 11/07/2020   Severe sepsis (HCC) 11/07/2020   UTI (urinary tract infection) 11/07/2020   GERD (gastroesophageal reflux disease) 12/20/2017   Hyperlipemia 12/20/2017   History of schizophrenia 07/30/2017   Schizophrenia (HCC) 05/27/2017   Type 2 diabetes mellitus with hyperglycemia, without long-term current use of insulin (HCC) 04/02/2017   Essential hypertension 04/02/2017   Noncompliance 04/02/2017   Depression 11/17/2013    Orientation RESPIRATION BLADDER Height & Weight     Self, Place, Situation  Normal Continent Weight: 46.2 kg Height:  5\' 2"  (157.5 cm)  BEHAVIORAL SYMPTOMS/MOOD NEUROLOGICAL BOWEL NUTRITION STATUS      Continent Diet (Dys 1)  AMBULATORY STATUS COMMUNICATION OF NEEDS Skin   Extensive Assist Verbally Normal                       Personal Care  Assistance Level of Assistance  Bathing, Dressing, Feeding Bathing Assistance: Maximum assistance Feeding assistance: Limited assistance Dressing Assistance: Maximum assistance     Functional Limitations Info             SPECIAL CARE FACTORS FREQUENCY  PT (By licensed PT), OT (By licensed OT)     PT Frequency: 5 times per week OT Frequency: 5 times per week            Contractures Contractures Info: Not present    Additional Factors Info  Code Status, Allergies Code Status Info: full code Allergies Info: NKDA           Current Medications (03/15/2021):  This is the current hospital active medication list Current Facility-Administered Medications  Medication Dose Route Frequency Provider Last Rate Last Admin   acetaminophen (TYLENOL) suppository 650 mg  650 mg Rectal Q4H PRN 05/15/2021, NP       acetaminophen (TYLENOL) tablet 650 mg  650 mg Oral Q4H PRN Jimmye Norman D, NP       ascorbic acid (VITAMIN C) tablet 500 mg  500 mg Per Tube Daily Harlon Ditty, MD   500 mg at 03/15/21 1047   chlorhexidine (PERIDEX) 0.12 % solution 15 mL  15 mL Mouth Rinse BID 05/15/21, MD   15 mL at 03/15/21 1046   Chlorhexidine Gluconate Cloth 2 % PADS 6 each  6 each Topical Daily 05/15/21, MD   6 each at 03/15/21 1049  docusate sodium (COLACE) capsule 100 mg  100 mg Oral BID PRN Judithe Modest, NP       enoxaparin (LOVENOX) injection 40 mg  40 mg Subcutaneous Q24H Bari Mantis A, RPH   40 mg at 03/14/21 2022   feeding supplement (NEPRO CARB STEADY) liquid 237 mL  237 mL Oral BID BM Lynn Ito, MD   237 mL at 03/15/21 1054   feeding supplement (OSMOLITE 1.2 CAL) liquid 1,000 mL  1,000 mL Per Tube Continuous Lynn Ito, MD 45 mL/hr at 03/10/21 1701 1,000 mL at 03/10/21 1701   feeding supplement (PROSource TF) liquid 45 mL  45 mL Per Tube Daily Lynn Ito, MD   45 mL at 03/15/21 1053   free water 60 mL  60 mL Per Tube Q4H Lynn Ito, MD   60  mL at 03/15/21 1201   insulin aspart (novoLOG) injection 0-9 Units  0-9 Units Subcutaneous Q4H Alford Highland, MD   7 Units at 03/15/21 1200   MEDLINE mouth rinse  15 mL Mouth Rinse q12n4p Danford, Earl Lites, MD   15 mL at 03/15/21 1201   metoprolol tartrate (LOPRESSOR) 25 mg/10 mL oral suspension 12.5 mg  12.5 mg Per Tube BID Alford Highland, MD   12.5 mg at 03/15/21 1049   morphine 2 MG/ML injection 2 mg  2 mg Intravenous Q4H PRN Jimmye Norman, NP       ondansetron [facility]) injection 4 mg  4 mg Intravenous Q6H PRN Harlon Ditty D, NP       polyethylene glycol (MIRALAX / GLYCOLAX) packet 17 g  17 g Oral Daily PRN Harlon Ditty D, NP   17 g at 03/08/21 1641   thiothixene (NAVANE) capsule 2 mg  2 mg Per Tube BID Clapacs, Jackquline Denmark, MD   2 mg at 03/15/21 1047     Discharge Medications: Please see discharge summary for a list of discharge medications.  Relevant Imaging Results:  Relevant Lab Results:   Additional Information SSN: [id number]  Barrie Dunker, RN

## 2021-03-15 NOTE — Progress Notes (Signed)
[facility]  - [place] at [facility]   PATIENT NAME: Erica Mueller    MR#:  [id number]  DATE OF BIRTH:  [birth year]  SUBJECTIVE:  sitting up in the recliner. Did have some interaction today. Tolerating Peg feeding  REVIEW OF SYSTEMS:   ROS Tolerating Diet: Tolerating PT:   DRUG ALLERGIES:  No Known Allergies  VITALS:  Blood pressure (!) 147/68, pulse 92, temperature 98.8 F (37.1 C), resp. rate 18, height 5\' 2"  (1.575 m), weight 46.2 kg, SpO2 99 %.  PHYSICAL EXAMINATION:   Physical Exam  GENERAL:  70 y.o.-year-old patient lying in the bed with no acute distress.  LUNGS: Normal breath sounds bilaterally, no wheezing, rales, rhonchi. No use of accessory muscles of respiration.  CARDIOVASCULAR: S1, S2 normal. No murmurs, rubs, or gallops.  ABDOMEN: Soft, nontender, nondistended. Bowel sounds present. No organomegaly or mass. PEG+ EXTREMITIES: No cyanosis, clubbing or edema b/l.    NEUROLOGIC: moves all extremities well PSYCHIATRIC:  patient is alert  SKIN: No obvious rash, lesion, or ulcer.   LABORATORY PANEL:  CBC Recent Labs  Lab 03/09/21 0633  WBC 7.0  HGB 11.5*  HCT 34.4*  PLT 335    Chemistries  Recent Labs  Lab 03/09/21 0633  NA 136  K 4.5  CL 101  CO2 28  GLUCOSE 183*  BUN 16  CREATININE 0.31*  CALCIUM 9.4   Cardiac Enzymes No results for input(s): TROPONINI in the last 168 hours. RADIOLOGY:  No results found. ASSESSMENT AND PLAN:  Patient admitted 02/07/2021 with severe sepsis COVID-19 infection.  Past medical history of schizophrenia, hypertension, GERD, type 2 diabetes mellitus and depression and CAD.  The patient had a prolonged hospital course and never regained mental status.  She was treated for electrolyte abnormalities including hypernatremia, hypokalemia and hypophosphatemia.  She had an NG tube for 2 weeks and then a PEG tube was placed on 02/24/2021.  There was a PEG tube malfunction 9/19. IR was able to change over the  guidewire.  Seen by psychiatry and restarted on psychiatric medications but still catatonic.  Psychiatry started ECT treatment.  Schizophrenia with catatonia. --No communication initially now more interactive --s/p ETC on 9/28 and 9/30 --10/5  Recommended holding off on ECT at this time and will reevaluate on daily basis --Continue her current antipsychotic orders --Focus on her eating and PT --PT OT consulted--recommend rehab. Pt will d/c to [facility] once cleared by Psych   Recent COVID infection. Severe sepsis secondary to aspiration pneumonia. Aspiration pneumonia. Improved on room air   Failure to thrive. Severe protein calorie malnutrition. Continue tube feeding per RD and speech recommendation       DVT prophylaxis: Lovenox Code Status: full Family Communication: None at bedside Disposition Plan:      Status is: Inpatient   Remains inpatient appropriate because:Ongoing diagnostic testing needed not appropriate for outpatient work up and Inpatient level of care appropriate due to severity of illness   Dispo: The patient is from: Home              Anticipated d/c is to: [facility] WHEN OK WITH [facility]              Patient currently is not medically stable to d/c.              Difficult to place patient No      TOTAL TIME TAKING CARE OF THIS PATIENT: 25 minutes.  >50% time spent on counselling and coordination of care  Note:  This dictation was prepared with Dragon dictation along with smaller phrase technology. Any transcriptional errors that result from this process are unintentional.  Erica Mueller M.D    [facility]   CC: Primary care physician; [facility] Patient ID: Erica Mueller, female   DOB: [birth year], 70 y.o.   MRN: [id number]

## 2021-03-15 NOTE — TOC Progression Note (Signed)
Transition of Care [facility]) - Progression Note    Patient Details  Name: Erica Mueller MRN: [id number] Date of Birth: 01-15-1951  [facility] CM/SW Contact  Barrie Dunker, RN Phone Number: 03/15/2021, 1:28 PM  Clinical Narrative:   The patient is a long term resident at [facility] and will return, It would be beneficial for the patient to go to the STR side prior to transitioning back to LTC, I uploaded clinical to the [facility] Portal trying to obtain ins approval to go to STR, TOC to continue working towrd DC    Expected Discharge Plan: Long Term Nursing Home Barriers to Discharge: Continued Medical Work up  Expected Discharge Plan and Services Expected Discharge Plan: Long Term Nursing Home In-house Referral: Clinical Social Work   Post Acute Care Choice: Nursing Home Living arrangements for the past 2 months:  Air cabin crew Health Care / Long Term Care Facility)                                       Social Determinants of Health (SDOH) Interventions    Readmission Risk Interventions No flowsheet data found.

## 2021-03-16 DIAGNOSIS — F061 Catatonic disorder due to known physiological condition: Secondary | ICD-10-CM | POA: Diagnosis not present

## 2021-03-16 LAB — RESP PANEL BY RT-PCR (FLU A&B, COVID) ARPGX2
Influenza A by PCR: NEGATIVE
Influenza B by PCR: NEGATIVE
SARS Coronavirus 2 by RT PCR: NEGATIVE

## 2021-03-16 LAB — CBC
HCT: 33 % — ABNORMAL LOW (ref 36.0–46.0)
Hemoglobin: 10.7 g/dL — ABNORMAL LOW (ref 12.0–15.0)
MCH: 31.7 pg (ref 26.0–34.0)
MCHC: 32.4 g/dL (ref 30.0–36.0)
MCV: 97.6 fL (ref 80.0–100.0)
Platelets: 316 10*3/uL (ref 150–400)
RBC: 3.38 MIL/uL — ABNORMAL LOW (ref 3.87–5.11)
RDW: 14.4 % (ref 11.5–15.5)
WBC: 4.5 10*3/uL (ref 4.0–10.5)
nRBC: 0 % (ref 0.0–0.2)

## 2021-03-16 LAB — BASIC METABOLIC PANEL
Anion gap: 6 (ref 5–15)
BUN: 29 mg/dL — ABNORMAL HIGH (ref 8–23)
CO2: 27 mmol/L (ref 22–32)
Calcium: 9.3 mg/dL (ref 8.9–10.3)
Chloride: 103 mmol/L (ref 98–111)
Creatinine, Ser: 0.44 mg/dL (ref 0.44–1.00)
GFR, Estimated: >60 mL/min (ref >60)
Glucose, Bld: 222 mg/dL — ABNORMAL HIGH (ref 70–99)
Potassium: 4.4 mmol/L (ref 3.5–5.1)
Sodium: 136 mmol/L (ref 135–145)

## 2021-03-16 LAB — GLUCOSE, CAPILLARY
Glucose-Capillary: 168 mg/dL — ABNORMAL HIGH (ref 70–99)
Glucose-Capillary: 237 mg/dL — ABNORMAL HIGH (ref 70–99)
Glucose-Capillary: 231 mg/dL — ABNORMAL HIGH (ref 70–99)
Glucose-Capillary: 182 mg/dL — ABNORMAL HIGH (ref 70–99)
Glucose-Capillary: 246 mg/dL — ABNORMAL HIGH (ref 70–99)
Glucose-Capillary: 227 mg/dL — ABNORMAL HIGH (ref 70–99)
Glucose-Capillary: 256 mg/dL — ABNORMAL HIGH (ref 70–99)
Glucose-Capillary: 265 mg/dL — ABNORMAL HIGH (ref 70–99)

## 2021-03-16 MED ORDER — SENNOSIDES-DOCUSATE SODIUM 8.6-50 MG PO TABS
1.0000 | ORAL_TABLET | Freq: Two times a day (BID) | ORAL | Status: DC
  Administered 2021-03-16 – 2021-03-27 (×16): 1 via ORAL
  Filled 2021-03-16 (×18): qty 1

## 2021-03-16 MED ORDER — PRAVASTATIN SODIUM 20 MG PO TABS
40.0000 mg | ORAL_TABLET | Freq: Every day | ORAL | Status: DC
  Administered 2021-03-16 – 2021-03-28 (×12): 40 mg via ORAL
  Filled 2021-03-16 (×12): qty 2

## 2021-03-16 MED ORDER — ASCORBIC ACID 500 MG PO TABS: 500.0000 mg | ORAL_TABLET | Freq: Two times a day (BID) | ORAL | Status: DC

## 2021-03-16 MED ORDER — GLIMEPIRIDE 2 MG PO TABS
2.0000 mg | ORAL_TABLET | Freq: Every day | ORAL | Status: DC
  Administered 2021-03-16 – 2021-03-29 (×10): 2 mg via ORAL
  Filled 2021-03-16 (×14): qty 1

## 2021-03-16 MED ORDER — POLYETHYLENE GLYCOL 3350 17 G PO PACK
17.0000 g | PACK | Freq: Every day | ORAL | Status: DC
  Administered 2021-03-16 – 2021-03-26 (×8): 17 g via ORAL
  Filled 2021-03-16 (×9): qty 1

## 2021-03-16 MED ORDER — VITAMIN D 25 MCG (1000 UNIT) PO TABS
1000.0000 [IU] | ORAL_TABLET | Freq: Every day | ORAL | Status: DC
  Administered 2021-03-16 – 2021-03-29 (×11): 1000 [IU] via ORAL
  Filled 2021-03-16 (×12): qty 1

## 2021-03-16 NOTE — Progress Notes (Signed)
Physical Therapy Treatment Patient Details Name: Erica Mueller MRN: [id number] DOB: 16-Jun-1950 Today's Date: 03/16/2021   History of Present Illness Pt is a 70 y/o F with PMH:  schizophrenia, HTN, GERD, type 2 diabetes mellitus,  depression and CAD. Pt presented on 02/07/21 d/t severe sepsis 2/2 COVID-19 infection. PEG tube placed 9/16 and tube changed 9/19. Pt has been catatonic. Phsychiatry started pt back on her baseline psych meds as well as ECT. Pt has become more interactive.    PT Comments    Pt alert in bed, indicated desire to get out of bed with therapy. Pt is non-communicative except for 'yes, no' responses and follows commands ~ 60% of the time. Overall limited in mobility for ambulation without significant assist + 2 MOD A, RW w/ extensive multimodal cues for stepping pattern. Pt does demonstrate progress in STS x 3 w/ MOD A, RW without signs of fatigue. Overall, pt pleasant and cooperative with primary limitations of decreased strength, coordination, and balance. Discharge recommendations remain SNF. Skilled PT intervention is indicated to address deficits in function, mobility, and to return to PLOF as able.     Recommendations for follow up therapy are one component of a multi-disciplinary discharge planning process, led by the attending physician.  Recommendations may be updated based on patient status, additional functional criteria and insurance authorization.  Follow Up Recommendations  SNF;Supervision for mobility/OOB     Equipment Recommendations  Other (comment) (TBD next venue of care)    Recommendations for Other Services       Precautions / Restrictions Precautions Precautions: Fall Precaution Comments: G-tube Restrictions Weight Bearing Restrictions: No     Mobility  Bed Mobility Overal bed mobility: Needs Assistance Bed Mobility: Supine to Sit     Supine to sit: HOB elevated;Mod assist;+2 for safety/equipment     General bed mobility comments: pt  able to move BLE to EOB, reaches for trunk support for momentum requiring physical assist    Transfers Overall transfer level: Needs assistance Equipment used: Rolling walker (2 wheeled) Transfers: Sit to/from [employer] Sit to Stand: Mod assist         General transfer comment: RW for BUE support, physical assist for task initation and momentum.  Ambulation/Gait Ambulation/Gait assistance: Mod assist;+2 safety/equipment Gait Distance (Feet): 1 Feet Assistive device: Rolling walker (2 wheeled) Gait Pattern/deviations: Step-to pattern     General Gait Details: Largely unable to step away from bed without signficant support w/ staggering and crossing feet over   Stairs             Wheelchair Mobility    Modified Rankin (Stroke Patients Only)       Balance Overall balance assessment: Needs assistance Sitting-balance support: Feet supported;Single extremity supported Sitting balance-Leahy Scale: Poor Sitting balance - Comments: Posterior lean, requires MIN A for support Postural control: Posterior lean Standing balance support: Bilateral upper extremity supported Standing balance-Leahy Scale: Zero Standing balance comment: unable to achieve full hip and knee extension w/ BLE support on bed                            Cognition Arousal/Alertness: Awake/alert Behavior During Therapy: Flat affect;WFL for tasks assessed/performed Overall Cognitive Status: No family/caregiver present to determine baseline cognitive functioning                                 General Comments: Pt  requires ~ 60% assistance to follow 1 step commands w/ multimodal cues; Alert throughout session and overall pleasant and cooperative during tx w/ limited verbalization.      Exercises      General Comments        Pertinent Vitals/Pain Pain Assessment: Faces Faces Pain Scale: No hurt Pain Intervention(s): Monitored during session    Home  Living                      Prior Function            PT Goals (current goals can now be found in the care plan section) Progress towards PT goals: Progressing toward goals    Frequency    Min 2X/week      PT Plan Current plan remains appropriate    Co-evaluation              AM-PAC PT "6 Clicks" Mobility   Outcome Measure  Help needed turning from your back to your side while in a flat bed without using bedrails?: A Little Help needed moving from lying on your back to sitting on the side of a flat bed without using bedrails?: A Lot Help needed moving to and from a bed to a chair (including a wheelchair)?: A Lot Help needed standing up from a chair using your arms (e.g., wheelchair or bedside chair)?: A Lot Help needed to walk in hospital room?: Total Help needed climbing 3-5 steps with a railing? : Total 6 Click Score: 11    End of Session Equipment Utilized During Treatment: Gait belt Activity Tolerance: Patient tolerated treatment well Patient left: in chair;with call bell/phone within reach;with chair alarm set Nurse Communication: Mobility status PT Visit Diagnosis: Unsteadiness on feet (R26.81);Other abnormalities of gait and mobility (R26.89);Muscle weakness (generalized) (M62.81);Difficulty in walking, not elsewhere classified (R26.2)     Time: 6962-9528 PT Time Calculation (min) (ACUTE ONLY): 31 min  Charges:                        [employer], SPT

## 2021-03-16 NOTE — Progress Notes (Signed)
Nutrition Brief Note   48 hour calorie count completed. Pt with poor oral intake and is only meeting about 3-7% of her estimated needs via oral intake. Spoke with MD, will continue tube feed regimen at goal rate. Pt will need to discharge on goal tube feed rate.   INTERVENTION:   Continue Osmolite 1.2 @ 56mL/hr  Free water flushes 54ml q4 hours to maintain tube patency   Regimen provides 1728 kcal/day, 80g/day of protein and 1541 mL of free water   Estimated Nutritional Needs:    Kcal:  1500-1700 kcal/d Protein:  75-85 g/d Fluid:  1.2-1.4L/day  Erica Holiday MS, RD, LDN Please refer to [facility] for RD and/or RD on-call/weekend/after hours pager

## 2021-03-16 NOTE — Consult Note (Signed)
[facility] Face-to-Face Psychiatry Consult   Reason for Consult: Follow-up consult 70 year old woman with a history of schizophrenia recently treated with ECT for catatonia Referring Physician: Allena Katz Patient Identification: Erica Mueller MRN:  [id number] Principal Diagnosis: Catatonia Diagnosis:  Principal Problem:   Catatonia Active Problems:   Type 2 diabetes mellitus with hyperglycemia, without long-term current use of insulin (HCC)   Essential hypertension   Schizophrenia (HCC)   Sepsis (HCC)   Pressure injury of skin   Protein-calorie malnutrition, severe   Failure to thrive in adult   Hypernatremia   Hypokalemia   AKI (acute kidney injury) (HCC)   COVID-19 virus infection   PEG tube malfunction (HCC)   Hypophosphatemia   Total Time spent with patient: 30 minutes  Subjective:   Erica Mueller is a 70 y.o. female patient admitted with patient currently not offering any specific complaint.  HPI: See previous notes.  70 year old woman with a history of schizophrenia would became catatonic after an intensive care unit stay.  This afternoon found the patient sitting up in the chair eyes open but not making any effort to eat the food in front of her.  Patient verbalized a greeting when I said hello to her but after that was not able to vocalize anything at all.  Did not answer any questions verbally.  She did make some effort to follow some commands but not all of them.  Seem to sort of blank out at some point making stereotypical movements with her arms.  Not able to engage in any conversation or tell me where she is.  Past Psychiatric History: Past history of schizophrenia.  Recent catatonic condition seemed to respond to ECT  Risk to Self:   Risk to Others:   Prior Inpatient Therapy:   Prior Outpatient Therapy:    Past Medical History:  Past Medical History:  Diagnosis Date   Coronary artery disease    Depression    Diabetes mellitus without complication (HCC)    Elevated  lipids    GERD (gastroesophageal reflux disease)    Hypertension    Schizophrenia (HCC)    Syphilis (acquired)     Past Surgical History:  Procedure Laterality Date   COLONOSCOPY     COLONOSCOPY WITH PROPOFOL N/A 09/01/2015   Procedure: COLONOSCOPY WITH PROPOFOL;  Surgeon: Wallace Cullens, MD;  Location: [facility] ENDOSCOPY;  Service: Gastroenterology;  Laterality: N/A;   IR FLUORO RM 30-60 MIN  02/23/2021   IR GASTROSTOMY TUBE MOD SED  02/24/2021   IR REPLC GASTRO/COLONIC TUBE PERCUT W/FLUORO  02/27/2021   TUBAL LIGATION     wisdom teeth removal     Family History:  Family History  Problem Relation Age of Onset   Cancer Mother    Alcoholism Mother    Bone cancer Father    Heart disease Father    Diabetes Maternal Grandmother    Suicidality Paternal Uncle    Breast cancer Neg Hx    Family Psychiatric  History: Father suicide Social History:  Social History   Substance and Sexual Activity  Alcohol Use Yes   Alcohol/week: 3.0 standard drinks   Types: 2 Cans of beer, 1 Shots of liquor per week     Social History   Substance and Sexual Activity  Drug Use Not Currently   Types: Marijuana   Comment: PAST     Social History   Socioeconomic History   Marital status: Divorced    Spouse name: Not on file   Number of children:  Not on file   Years of education: Not on file   Highest education level: Not on file  Occupational History   Not on file  Tobacco Use   Smoking status: Every Day    Packs/day: 1.00    Years: 48.00    Pack years: 48.00    Types: Cigarettes    Start date: 12/20/1969   Smokeless tobacco: Never  Vaping Use   Vaping Use: Never used  Substance and Sexual Activity   Alcohol use: Yes    Alcohol/week: 3.0 standard drinks    Types: 2 Cans of beer, 1 Shots of liquor per week   Drug use: Not Currently    Types: Marijuana    Comment: PAST    Sexual activity: Not Currently    Birth control/protection: None  Other Topics Concern   Not on file  Social History  Narrative   Not on file   Social Determinants of Health   Financial Resource Strain: Not on file  Food Insecurity: Not on file  Transportation Needs: Not on file  Physical Activity: Not on file  Stress: Not on file  Social Connections: Not on file   Additional Social History:    Allergies:  No Known Allergies  Labs:  Results for orders placed or performed during the hospital encounter of 02/07/21 (from the past 48 hour(s))  Glucose, capillary     Status: Abnormal   Collection Time: 03/14/21  8:03 PM  Result Value Ref Range   Glucose-Capillary 251 (H) 70 - 99 mg/dL    Comment: Glucose reference range applies only to samples taken after fasting for at least 8 hours.   Comment 1 Notify RN   Glucose, capillary     Status: Abnormal   Collection Time: 03/14/21  8:25 PM  Result Value Ref Range   Glucose-Capillary 249 (H) 70 - 99 mg/dL    Comment: Glucose reference range applies only to samples taken after fasting for at least 8 hours.  Glucose, capillary     Status: Abnormal   Collection Time: 03/15/21 12:24 AM  Result Value Ref Range   Glucose-Capillary 154 (H) 70 - 99 mg/dL    Comment: Glucose reference range applies only to samples taken after fasting for at least 8 hours.  Glucose, capillary     Status: Abnormal   Collection Time: 03/15/21  3:40 AM  Result Value Ref Range   Glucose-Capillary 167 (H) 70 - 99 mg/dL    Comment: Glucose reference range applies only to samples taken after fasting for at least 8 hours.  Glucose, capillary     Status: Abnormal   Collection Time: 03/15/21  8:43 AM  Result Value Ref Range   Glucose-Capillary 212 (H) 70 - 99 mg/dL    Comment: Glucose reference range applies only to samples taken after fasting for at least 8 hours.  Glucose, capillary     Status: Abnormal   Collection Time: 03/15/21 11:54 AM  Result Value Ref Range   Glucose-Capillary 321 (H) 70 - 99 mg/dL    Comment: Glucose reference range applies only to samples taken after  fasting for at least 8 hours.  Glucose, capillary     Status: Abnormal   Collection Time: 03/15/21  3:49 PM  Result Value Ref Range   Glucose-Capillary 237 (H) 70 - 99 mg/dL    Comment: Glucose reference range applies only to samples taken after fasting for at least 8 hours.  Glucose, capillary     Status: Abnormal   Collection  Time: 03/15/21  8:45 PM  Result Value Ref Range   Glucose-Capillary 231 (H) 70 - 99 mg/dL    Comment: Glucose reference range applies only to samples taken after fasting for at least 8 hours.   Comment 1 Notify RN   Glucose, capillary     Status: Abnormal   Collection Time: 03/15/21 11:11 PM  Result Value Ref Range   Glucose-Capillary 182 (H) 70 - 99 mg/dL    Comment: Glucose reference range applies only to samples taken after fasting for at least 8 hours.  Basic metabolic panel     Status: Abnormal   Collection Time: 03/16/21  2:42 AM  Result Value Ref Range   Sodium 136 135 - 145 mmol/L   Potassium 4.4 3.5 - 5.1 mmol/L   Chloride 103 98 - 111 mmol/L   CO2 27 22 - 32 mmol/L   Glucose, Bld 222 (H) 70 - 99 mg/dL    Comment: Glucose reference range applies only to samples taken after fasting for at least 8 hours.   BUN 29 (H) 8 - 23 mg/dL   Creatinine, Ser 7.03 0.44 - 1.00 mg/dL   Calcium 9.3 8.9 - 40.3 mg/dL   GFR, Estimated >52 >48 mL/min    Comment: (NOTE) Calculated using the CKD-EPI Creatinine Equation (2021)    Anion gap 6 5 - 15    Comment: Performed at [facility], [address] [place], [place] [address]  CBC     Status: Abnormal   Collection Time: 03/16/21  2:42 AM  Result Value Ref Range   WBC 4.5 4.0 - 10.5 K/uL   RBC 3.38 (L) 3.87 - 5.11 MIL/uL   Hemoglobin 10.7 (L) 12.0 - 15.0 g/dL   HCT 93.1 (L) 12.1 - 62.4 %   MCV 97.6 80.0 - 100.0 fL   MCH 31.7 26.0 - 34.0 pg   MCHC 32.4 30.0 - 36.0 g/dL   RDW 46.9 50.7 - 22.5 %   Platelets 316 150 - 400 K/uL   nRBC 0.0 0.0 - 0.2 %    Comment: Performed at [facility], [address] [place], [place] [address]  Glucose, capillary     Status: Abnormal   Collection Time: 03/16/21  4:08 AM  Result Value Ref Range   Glucose-Capillary 246 (H) 70 - 99 mg/dL    Comment: Glucose reference range applies only to samples taken after fasting for at least 8 hours.  Glucose, capillary     Status: Abnormal   Collection Time: 03/16/21  8:16 AM  Result Value Ref Range   Glucose-Capillary 227 (H) 70 - 99 mg/dL    Comment: Glucose reference range applies only to samples taken after fasting for at least 8 hours.  Glucose, capillary     Status: Abnormal   Collection Time: 03/16/21 10:27 AM  Result Value Ref Range   Glucose-Capillary 256 (H) 70 - 99 mg/dL    Comment: Glucose reference range applies only to samples taken after fasting for at least 8 hours.  Glucose, capillary     Status: Abnormal   Collection Time: 03/16/21 11:19 AM  Result Value Ref Range   Glucose-Capillary 265 (H) 70 - 99 mg/dL    Comment: Glucose reference range applies only to samples taken after fasting for at least 8 hours.   Comment 1 Notify RN    Comment 2 Document in Chart   Resp Panel by RT-PCR (Flu A&B, Covid) Nasopharyngeal Swab     Status: None  Collection Time: 03/16/21  2:40 PM   Specimen: Nasopharyngeal Swab; Nasopharyngeal(NP) swabs in vial transport medium  Result Value Ref Range   SARS Coronavirus 2 by RT PCR NEGATIVE NEGATIVE    Comment: (NOTE) SARS-CoV-2 target nucleic acids are NOT DETECTED.  The SARS-CoV-2 RNA is generally detectable in upper respiratory specimens during the acute phase of infection. The lowest concentration of SARS-CoV-2 viral copies this assay can detect is 138 copies/mL. A negative result does not preclude SARS-Cov-2 infection and should not be used as the sole basis for treatment or other patient management decisions. A negative result may occur with  improper specimen collection/handling, submission of specimen other than nasopharyngeal swab, presence of  viral mutation(s) within the areas targeted by this assay, and inadequate number of viral copies(<138 copies/mL). A negative result must be combined with clinical observations, patient history, and epidemiological information. The expected result is Negative.  Fact Sheet for Patients:  BloggerCourse.com  Fact Sheet for Healthcare Providers:  SeriousBroker.it  This test is no t yet approved or cleared by the [place] FDA and  has been authorized for detection and/or diagnosis of SARS-CoV-2 by FDA under an Emergency Use Authorization (EUA). This EUA will remain  in effect (meaning this test can be used) for the duration of the COVID-19 declaration under Section 564(b)(1) of the Act, 21 U.S.C.section 360bbb-3(b)(1), unless the authorization is terminated  or revoked sooner.       Influenza A by PCR NEGATIVE NEGATIVE   Influenza B by PCR NEGATIVE NEGATIVE    Comment: (NOTE) The Xpert Xpress SARS-CoV-2/FLU/RSV plus assay is intended as an aid in the diagnosis of influenza from Nasopharyngeal swab specimens and should not be used as a sole basis for treatment. Nasal washings and aspirates are unacceptable for Xpert Xpress SARS-CoV-2/FLU/RSV testing.  Fact Sheet for Patients: BloggerCourse.com  Fact Sheet for Healthcare Providers: SeriousBroker.it  This test is not yet approved or cleared by the [place] FDA and has been authorized for detection and/or diagnosis of SARS-CoV-2 by FDA under an Emergency Use Authorization (EUA). This EUA will remain in effect (meaning this test can be used) for the duration of the COVID-19 declaration under Section 564(b)(1) of the Act, 21 U.S.C. section 360bbb-3(b)(1), unless the authorization is terminated or revoked.  Performed at [facility], [address] [place], [place] [address]     Current Facility-Administered  Medications  Medication Dose Route Frequency Provider Last Rate Last Admin   acetaminophen (TYLENOL) suppository 650 mg  650 mg Rectal Q4H PRN Jimmye Norman, NP       acetaminophen (TYLENOL) tablet 650 mg  650 mg Oral Q4H PRN Harlon Ditty D, NP       ascorbic acid (VITAMIN C) tablet 500 mg  500 mg Per Tube Daily Charise Killian, MD   500 mg at 03/16/21 1033   chlorhexidine (PERIDEX) 0.12 % solution 15 mL  15 mL Mouth Rinse BID Alberteen Sam, MD   15 mL at 03/16/21 1033   Chlorhexidine Gluconate Cloth 2 % PADS 6 each  6 each Topical Daily Erin Fulling, MD   6 each at 03/16/21 1027   cholecalciferol (VITAMIN D3) tablet 1,000 Units  1,000 Units Oral Daily [name], MD   1,000 Units at 03/16/21 1433   docusate sodium (COLACE) capsule 100 mg  100 mg Oral BID PRN Judithe Modest, NP       enoxaparin (LOVENOX) injection 40 mg  40 mg Subcutaneous Q24H Angelique Blonder, [facility]  40 mg at 03/15/21 2306   feeding supplement (NEPRO CARB STEADY) liquid 237 mL  237 mL Oral BID BM Lynn Ito, MD   237 mL at 03/16/21 1434   feeding supplement (OSMOLITE 1.2 CAL) liquid 1,000 mL  1,000 mL Per Tube Continuous [name], MD 60 mL/hr at 03/15/21 1907 1,000 mL at 03/15/21 1907   free water 60 mL  60 mL Per Tube Q4H Lynn Ito, MD   60 mL at 03/16/21 1712   glimepiride (AMARYL) tablet 2 mg  2 mg Oral Daily [name], MD   2 mg at 03/16/21 1433   insulin aspart (novoLOG) injection 0-9 Units  0-9 Units Subcutaneous Q4H Alford Highland, MD   5 Units at 03/16/21 1753   MEDLINE mouth rinse  15 mL Mouth Rinse q12n4p Alberteen Sam, MD   15 mL at 03/16/21 1712   metoprolol tartrate (LOPRESSOR) 25 mg/10 mL oral suspension 12.5 mg  12.5 mg Per Tube BID Alford Highland, MD   12.5 mg at 03/15/21 2324   ondansetron (ZOFRAN) injection 4 mg  4 mg Intravenous Q6H PRN Judithe Modest, NP       polyethylene glycol (MIRALAX / GLYCOLAX) packet 17 g  17 g Oral Daily [name], MD   17 g at  03/16/21 1711   pravastatin (PRAVACHOL) tablet 40 mg  40 mg Oral q1800 [name], MD   40 mg at 03/16/21 1711   senna-docusate (Senokot-S) tablet 1 tablet  1 tablet Oral BID [name], MD   1 tablet at 03/16/21 1711   thiothixene (NAVANE) capsule 2 mg  2 mg Per Tube BID Jodie Cavey, Jackquline Denmark, MD   2 mg at 03/16/21 1033    Musculoskeletal: Strength & Muscle Tone: decreased Gait & Station: unable to stand Patient leans: N/A            Psychiatric Specialty Exam:  Presentation  General Appearance:  No data recorded Eye Contact: No data recorded Speech: No data recorded Speech Volume: No data recorded Handedness: No data recorded  Mood and Affect  Mood: No data recorded Affect: No data recorded  Thought Process  Thought Processes: No data recorded Descriptions of Associations:No data recorded Orientation:No data recorded Thought Content:No data recorded History of Schizophrenia/Schizoaffective disorder:No data recorded Duration of Psychotic Symptoms:No data recorded Hallucinations:No data recorded Ideas of Reference:No data recorded Suicidal Thoughts:No data recorded Homicidal Thoughts:No data recorded  Sensorium  Memory: No data recorded Judgment: No data recorded Insight: No data recorded  Executive Functions  Concentration: No data recorded Attention Span: No data recorded Recall: No data recorded Fund of Knowledge: No data recorded Language: No data recorded  Psychomotor Activity  Psychomotor Activity: No data recorded  Assets  Assets: No data recorded  Sleep  Sleep: No data recorded  Physical Exam: Physical Exam Vitals and nursing note reviewed.  Constitutional:      Appearance: Normal appearance. She is ill-appearing.  HENT:     Head: Normocephalic and atraumatic.     Mouth/Throat:     Pharynx: Oropharynx is clear.  Eyes:     Pupils: Pupils are equal, round, and reactive to light.  Cardiovascular:     Rate and Rhythm:  Normal rate and regular rhythm.  Pulmonary:     Effort: Pulmonary effort is normal.     Breath sounds: Normal breath sounds.  Abdominal:     General: Abdomen is flat.     Palpations: Abdomen is soft.  Musculoskeletal:  General: Normal range of motion.  Skin:    General: Skin is warm and dry.  Neurological:     General: No focal deficit present.     Mental Status: She is alert. Mental status is at baseline.  Psychiatric:        Attention and Perception: She is inattentive.        Mood and Affect: Affect is blunt.        Speech: She is noncommunicative.        Cognition and Memory: Cognition is impaired.   Review of Systems  Unable to perform ROS: Psychiatric disorder  Blood pressure (!) 134/54, pulse 86, temperature 98.6 F (37 C), resp. rate 18, height 5\' 2"  (1.575 m), weight 46.5 kg, SpO2 100 %. Body mass index is 18.75 kg/m.  Treatment Plan Summary: Plan patient today seems to be doing worse than yesterday.  Seems to have slid back into some degree of catatonia.  Not currently able to eat well on her own or take advantage of physical therapy she is not showing signs of being likely to improve her condition.  Because of this I am recommending continued ECT.  I will send a message to the attending hospitalist and have put an order in for n.p.o. after midnight tonight and we will have her on the schedule for ECT tomorrow.  Disposition:  ect  , MD 03/16/2021 5:55 PM

## 2021-03-16 NOTE — Progress Notes (Signed)
48 Hour Calorie Count Day 2  Estimated Nutritional Needs:   Kcal:  1500-1700 kcal/d Protein:  75-85 g/d Fluid:  1.2-1.4L/day  Dinner 10/5: sips/bites only  Total- 25kcal and 1g protein  Breakfast 10/6: pt ate 0% of her breakfast but ate 1/2 applesauce with meds  Total- 80kcal and <1g protein   Lunch 10/6: patient consumed 0% of this meal  Total- 0kcal and 0g protein   Total Intake:  105kcal (7% estimated needs) and  2g protein (3% estimated needs)  Betsey Holiday MS, RD, LDN Please refer to [facility] for RD and/or RD on-call/weekend/after hours pager

## 2021-03-16 NOTE — Progress Notes (Signed)
[facility]  - [place] [facility]   PATIENT NAME: Erica Mueller    MR#:  [id number]  DATE OF BIRTH:  1950/12/18  SUBJECTIVE:  sitting up in the bed  Did have some interaction today. Tolerating Peg feeding Poor po intake  REVIEW OF SYSTEMS:   Review of Systems  Unable to perform ROS: Psychiatric disorder  Tolerating Diet:PEG feeding Tolerating PT:   DRUG ALLERGIES:  No Known Allergies  VITALS:  Blood pressure (!) 147/55, pulse 87, temperature 98.8 F (37.1 C), resp. rate 16, height 5\' 2"  (1.575 m), weight 46.5 kg, SpO2 99 %.  PHYSICAL EXAMINATION:   Physical Exam  GENERAL:  70 y.o.-year-old patient lying in the bed with no acute distress.  LUNGS: Normal breath sounds bilaterally, no wheezing, rales, rhonchi. No use of accessory muscles of respiration.  CARDIOVASCULAR: S1, S2 normal. No murmurs, rubs, or gallops.  ABDOMEN: Soft, nontender, nondistended. Bowel sounds present. No organomegaly or mass. PEG+ EXTREMITIES: No cyanosis, clubbing or edema b/l.    NEUROLOGIC: moves all extremities well PSYCHIATRIC:  patient is alert  SKIN: No obvious rash, lesion, or ulcer.   LABORATORY PANEL:  CBC Recent Labs  Lab 03/16/21 0242  WBC 4.5  HGB 10.7*  HCT 33.0*  PLT 316     Chemistries  Recent Labs  Lab 03/16/21 0242  NA 136  K 4.4  CL 103  CO2 27  GLUCOSE 222*  BUN 29*  CREATININE 0.44  CALCIUM 9.3    Cardiac Enzymes No results for input(s): TROPONINI in the last 168 hours. RADIOLOGY:  No results found. ASSESSMENT AND PLAN:  Patient admitted 02/07/2021 with severe sepsis COVID-19 infection.  Past medical history of schizophrenia, hypertension, GERD, type 2 diabetes mellitus and depression and CAD.  The patient had a prolonged hospital course and never regained mental status.  She was treated for electrolyte abnormalities including hypernatremia, hypokalemia and hypophosphatemia.  She had an NG tube for 2 weeks and then a PEG tube was placed on  02/24/2021.  There was a PEG tube malfunction 9/19. IR was able to change over the guidewire.  Seen by psychiatry and restarted on psychiatric medications but still catatonic.  Psychiatry started ECT treatment.  Schizophrenia with catatonia. --No communication initially now more interactive --s/p ETC on 9/28 and 9/30 --10/5  Recommended holding off on ECT at this time and will reevaluate on daily basis --Continue her current antipsychotic orders --Focus on her eating and PT --PT OT consulted--recommend rehab. Pt will d/c to [facility] once cleared by Psych   Recent COVID infection. Severe sepsis secondary to aspiration pneumonia. Aspiration pneumonia. Improved on room air   Failure to thrive. Severe protein calorie malnutrition. Continue tube feeding per RD and speech recommendation       DVT prophylaxis: Lovenox Code Status: full Family Communication: None at bedside Disposition Plan:      Status is: Inpatient   Remains inpatient appropriate because:Ongoing diagnostic testing needed not appropriate for outpatient work up and Inpatient level of care appropriate due to severity of illness   Dispo: The patient is from: Home              Anticipated d/c is to: [facility] WHEN OK WITH [facility]              Patient currently is not medically stable to d/c.              Difficult to place patient No      TOTAL TIME TAKING CARE  OF THIS PATIENT: 25 minutes.  >50% time spent on counselling and coordination of care  Note: This dictation was prepared with Dragon dictation along with smaller phrase technology. Any transcriptional errors that result from this process are unintentional.  [name] M.D    [facility]   CC: Primary care physician; [facility] Patient ID: ISIS COSTANZA, female   DOB: August 07, 1950, 70 y.o.   MRN: [id number]

## 2021-03-16 NOTE — Progress Notes (Signed)
Patient stable condition at end of shift. No adverse events noted. Will continue to monitor.

## 2021-03-16 NOTE — TOC Progression Note (Signed)
Transition of Care [facility]) - Progression Note    Patient Details  Name: Erica Mueller MRN: [id number] Date of Birth: Feb 12, 1951  Transition of Care [facility]) CM/SW Contact  Marlowe Sax, RN Phone Number: 03/16/2021, 11:20 AM  Clinical Narrative:   Received ins approval to go to [facility] for STR then Transition back to LTC, Auth number [id number], next review date 10/7    Expected Discharge Plan: Long Term Nursing Home Barriers to Discharge: Continued Medical Work up  Expected Discharge Plan and Services Expected Discharge Plan: Long Term Nursing Home In-house Referral: Clinical Social Work   Post Acute Care Choice: Nursing Home Living arrangements for the past 2 months:  ([facility] / Long Term Care Facility)                                       Social Determinants of Health (SDOH) Interventions    Readmission Risk Interventions No flowsheet data found.

## 2021-03-16 NOTE — Progress Notes (Signed)
Inpatient Diabetes Program Recommendations  AACE/ADA: New Consensus Statement on Inpatient Glycemic Control   Target Ranges:  Prepandial:   less than 140 mg/dL      Peak postprandial:   less than 180 mg/dL (1-2 hours)      Critically ill patients:  140 - 180 mg/dL    Results for IDY, RAWLING (MRN [id number]) as of 03/16/2021 12:22  Ref. Range 03/16/2021 04:08 03/16/2021 08:16 03/16/2021 10:27 03/16/2021 11:19  Glucose-Capillary Latest Ref Range: 70 - 99 mg/dL 505 (H) 697 (H) 948 (H) 265 (H)  Results for ANDERA, CRANMER (MRN [id number]) as of 03/16/2021 12:22  Ref. Range 03/15/2021 08:43 03/15/2021 11:54 03/15/2021 15:49 03/15/2021 20:45 03/15/2021 23:11  Glucose-Capillary Latest Ref Range: 70 - 99 mg/dL 270 (H) 786 (H) 754 (H) 231 (H) 182 (H)   Review of Glycemic Control  Diabetes history: DM2 Outpatient Diabetes medications: Metformin 1000 mg BID, Amaryl 2 mg daily Current orders for Inpatient glycemic control: Novolog 0-9 units Q4H, Amaryl 2 mg daily; [place] @ 60 ml/hr   Inpatient Diabetes Program Recommendations:     Insulin-Tube Feeding: Please consider ordering Novolog 3 units Q4H for tube feeding coverage. If tube feeding is stopped or held then Novolog tube feeding coverage should also be stopped or held.   Thanks, Orlando Penner, RN, MSN, CDE Diabetes Coordinator Inpatient Diabetes Program [phone] (Team Pager from 8am to 5pm)

## 2021-03-17 ENCOUNTER — Inpatient Hospital Stay: Payer: Medicare Other

## 2021-03-17 ENCOUNTER — Inpatient Hospital Stay: Payer: Medicare Other | Admitting: Certified Registered"

## 2021-03-17 ENCOUNTER — Encounter: Payer: Self-pay | Admitting: Family Medicine

## 2021-03-17 ENCOUNTER — Other Ambulatory Visit: Payer: Self-pay | Admitting: Psychiatry

## 2021-03-17 DIAGNOSIS — F061 Catatonic disorder due to known physiological condition: Secondary | ICD-10-CM | POA: Diagnosis not present

## 2021-03-17 LAB — GLUCOSE, CAPILLARY
Glucose-Capillary: 276 mg/dL — ABNORMAL HIGH (ref 70–99)
Glucose-Capillary: 108 mg/dL — ABNORMAL HIGH (ref 70–99)
Glucose-Capillary: 145 mg/dL — ABNORMAL HIGH (ref 70–99)
Glucose-Capillary: 115 mg/dL — ABNORMAL HIGH (ref 70–99)
Glucose-Capillary: 126 mg/dL — ABNORMAL HIGH (ref 70–99)

## 2021-03-17 MED ORDER — METHOHEXITAL SODIUM 100 MG/10ML IV SOSY
PREFILLED_SYRINGE | INTRAVENOUS | Status: DC | PRN
  Administered 2021-03-17: 50 mg via INTRAVENOUS

## 2021-03-17 MED ORDER — SODIUM CHLORIDE 0.9 % IV SOLN: INTRAVENOUS | Status: DC | PRN

## 2021-03-17 MED ORDER — KETOROLAC TROMETHAMINE 30 MG/ML IJ SOLN
INTRAMUSCULAR | Status: AC
  Administered 2021-03-17: 30 mg via INTRAVENOUS
  Filled 2021-03-17: qty 1

## 2021-03-17 MED ORDER — SODIUM CHLORIDE 0.9 % IV SOLN
500.0000 mL | Freq: Once | INTRAVENOUS | Status: AC
  Administered 2021-03-17: 500 mL via INTRAVENOUS

## 2021-03-17 MED ORDER — SUCCINYLCHOLINE CHLORIDE 200 MG/10ML IV SOSY
PREFILLED_SYRINGE | INTRAVENOUS | Status: DC | PRN
  Administered 2021-03-17: 70 mg via INTRAVENOUS

## 2021-03-17 MED ORDER — KETOROLAC TROMETHAMINE 30 MG/ML IJ SOLN: 30.0000 mg | Freq: Once | INTRAMUSCULAR | Status: AC

## 2021-03-17 NOTE — Transfer of Care (Signed)
Immediate Anesthesia Transfer of Care Note  Patient: Erica Mueller  Procedure(s) Performed: ECT TX  Patient Location: PACU  Anesthesia Type:General  Level of Consciousness: awake, drowsy and patient cooperative  Airway & Oxygen Therapy: Patient Spontanous Breathing and Patient connected to face mask oxygen  Post-op Assessment: Report given to RN and Post -op Vital signs reviewed and stable  Post vital signs: Reviewed and stable  Last Vitals:  Vitals Value Taken Time  BP 164/59 03/17/21 1343  Temp    Pulse 94 03/17/21 1344  Resp 28 03/17/21 1344  SpO2 100 % 03/17/21 1344  Vitals shown include unvalidated device data.  Last Pain:  Vitals:   03/17/21 1116  TempSrc: Oral  PainSc: 0-No pain         Complications: No notable events documented.

## 2021-03-17 NOTE — Progress Notes (Signed)
[facility]  - [place] at [facility]   PATIENT NAME: Erica Mueller    MR#:  [id number]  DATE OF BIRTH:  03-03-1951  SUBJECTIVE:  patient sleeping. Unable to have any interaction today. Tolerating Peg feeding-- on hold today for scheduled ECT   REVIEW OF SYSTEMS:   Review of Systems  Unable to perform ROS: Psychiatric disorder  Tolerating Diet:PEG feeding Tolerating PT:   DRUG ALLERGIES:  No Known Allergies  VITALS:  Blood pressure 92/62, pulse 75, temperature 98.9 F (37.2 C), temperature source Oral, resp. rate 15, height 5\' 2"  (1.575 m), weight 46.5 kg, SpO2 98 %.  PHYSICAL EXAMINATION:   Physical Exam  GENERAL:  70 y.o.-year-old patient lying in the bed with no acute distress.  LUNGS: Normal breath sounds bilaterally, no wheezing, rales, rhonchi. No use of accessory muscles of respiration.  CARDIOVASCULAR: S1, S2 normal. No murmurs, rubs, or gallops.  ABDOMEN: Soft, nontender, nondistended. Bowel sounds present. No organomegaly or mass. PEG+ EXTREMITIES: No cyanosis, clubbing or edema b/l.    NEUROLOGIC: moves all extremities well spont PSYCHIATRIC:  patient is sleepy SKIN: No obvious rash, lesion, or ulcer.   LABORATORY PANEL:  CBC Recent Labs  Lab 03/16/21 0242  WBC 4.5  HGB 10.7*  HCT 33.0*  PLT 316     Chemistries  Recent Labs  Lab 03/16/21 0242  NA 136  K 4.4  CL 103  CO2 27  GLUCOSE 222*  BUN 29*  CREATININE 0.44  CALCIUM 9.3    Cardiac Enzymes No results for input(s): TROPONINI in the last 168 hours. RADIOLOGY:  No results found. ASSESSMENT AND PLAN:  Patient admitted 02/07/2021 with severe sepsis COVID-19 infection.  Past medical history of schizophrenia, hypertension, GERD, type 2 diabetes mellitus and depression and CAD.  The patient had a prolonged hospital course and never regained mental status.  She was treated for electrolyte abnormalities including hypernatremia, hypokalemia and hypophosphatemia.  She had an NG tube  for 2 weeks and then a PEG tube was placed on 02/24/2021.  There was a PEG tube malfunction 9/19. IR was able to change over the guidewire.  Seen by psychiatry and restarted on psychiatric medications but still catatonic.  Psychiatry started ECT treatment.  Schizophrenia with catatonia. --No communication initially now more interactive --s/p ETC on 9/28 and 9/30 --10/5  Recommended holding off on ECT at this time and will reevaluate on daily basis --Continue her current antipsychotic orders --Focus on her eating and PT --PT OT consulted--recommend rehab. Pt will d/c to [facility] once cleared by Psych - 10/7--ECT today   Recent COVID infection. Severe sepsis secondary to aspiration pneumonia. Aspiration pneumonia. Improved on room air   Failure to thrive. Severe protein calorie malnutrition. Continue tube feeding per RD and speech recommendation       DVT prophylaxis: Lovenox Code Status: full Family Communication: None at bedside Disposition Plan:      Status is: Inpatient   Getting ECT  Dispo: The patient is from: Home              Anticipated d/c is to: [facility] WHEN OK WITH [facility]              Patient currently is not medically stable to d/c.              Difficult to place patient No      TOTAL TIME TAKING CARE OF THIS PATIENT: 25 minutes.  >50% time spent on counselling and coordination of care  Note:  This dictation was prepared with Dragon dictation along with smaller phrase technology. Any transcriptional errors that result from this process are unintentional.  [name] M.D    [facility]   CC: Primary care physician; [facility] Patient ID: DELYLAH STANCZYK, female   DOB: [birth year], 70 y.o.   MRN: [id number]

## 2021-03-17 NOTE — Anesthesia Postprocedure Evaluation (Signed)
Anesthesia Post Note  Patient: Erica Mueller  Procedure(s) Performed: ECT TX  Patient location during evaluation: PACU Anesthesia Type: General Level of consciousness: awake and alert, oriented and patient cooperative Pain management: pain level controlled Vital Signs Assessment: post-procedure vital signs reviewed and stable Respiratory status: spontaneous breathing, nonlabored ventilation and respiratory function stable Cardiovascular status: blood pressure returned to baseline and stable Postop Assessment: adequate PO intake Anesthetic complications: no   No notable events documented.   Last Vitals:  Vitals:   03/17/21 1345 03/17/21 1400  BP: (!) 152/94 (!) 151/66  Pulse: 94 95  Resp: (!) 28 (!) 28  Temp: 36.9 C   SpO2: 100% 98%    Last Pain:  Vitals:   03/17/21 1116  TempSrc: Oral  PainSc: 0-No pain                 Reed Breech

## 2021-03-17 NOTE — Procedures (Signed)
ECT SERVICES Physician's Interval Evaluation & Treatment Note  Patient Identification: Erica Mueller MRN:  [id number] Date of Evaluation:  03/17/2021 TX #: 5  MADRS:   MMSE:   P.E. Findings:  Patient is withdrawn again uncooperative limited attention.  Not responding well or eating normally.  Psychiatric Interval Note:  Had been looking like she was doing well initially with ECT now back to withdrawn almost to the point of catatonia  Subjective:  Patient is a 70 y.o. female seen for evaluation for Electroconvulsive Therapy. Patient not offering any information  Treatment Summary:   []   Right Unilateral             [x]  Bilateral   % Energy : 1.0 ms 60% 1440 ohms   Impedance: 1440 ohms  Seizure Energy Index: 6877 V squared  Postictal Suppression Index: 79%  Seizure Concordance Index: 84%  Medications  Pre Shock: Toradol 30 mg Brevital 50 mg succinylcholine 70 mg  Post Shock:    Seizure Duration: 46 seconds EMG 52 seconds EEG   Comments: Follow-up next treatment Monday add small dose Robinul.  Lungs:  [x]   Clear to auscultation               []  Other:   Heart:    [x]   Regular rhythm             []  irregular rhythm    [x]   Previous H&P reviewed, patient examined and there are NO CHANGES                 []   Previous H&P reviewed, patient examined and there are changes noted.   , MD 10/7/20223:54 PM

## 2021-03-17 NOTE — Plan of Care (Signed)
  Problem: Education: Goal: Knowledge of General Education information will improve Description: Including pain rating scale, medication(s)/side effects and non-pharmacologic comfort measures Outcome: Progressing   Problem: Health Behavior/Discharge Planning: Goal: Ability to manage health-related needs will improve Outcome: Progressing   Problem: Clinical Measurements: Goal: Ability to maintain clinical measurements within normal limits will improve Outcome: Progressing Goal: Will remain free from infection Outcome: Progressing Goal: Diagnostic test results will improve Outcome: Progressing Goal: Respiratory complications will improve Outcome: Progressing Goal: Cardiovascular complication will be avoided Outcome: Progressing   Problem: Activity: Goal: Risk for activity intolerance will decrease Outcome: Progressing   Problem: Nutrition: Goal: Adequate nutrition will be maintained Outcome: Progressing   Problem: Coping: Goal: Level of anxiety will decrease Outcome: Progressing   Problem: Elimination: Goal: Will not experience complications related to bowel motility Outcome: Progressing Goal: Will not experience complications related to urinary retention Outcome: Progressing   Problem: Pain Managment: Goal: General experience of comfort will improve Outcome: Progressing   Problem: Safety: Goal: Ability to remain free from injury will improve Outcome: Progressing   Problem: Skin Integrity: Goal: Risk for impaired skin integrity will decrease Outcome: Progressing   Problem: Respiratory: Goal: Will maintain a patent airway Outcome: Progressing Goal: Complications related to the disease process, condition or treatment will be avoided or minimized Outcome: Progressing   

## 2021-03-17 NOTE — TOC Progression Note (Signed)
Transition of Care [facility]) - Progression Note    Patient Details  Name: Erica Mueller MRN: [id number] Date of Birth: [birth year]  Transition of Care [facility]) CM/SW Contact  Marlowe Sax, RN Phone Number: 03/17/2021, 12:11 PM  Clinical Narrative:    The patient will get ECT today, Insurance approval will expire today, she plans to return to [facility], will need Ins auth restarted to go STR SNF before going back to SNF LTC   Expected Discharge Plan: Long Term Nursing Home Barriers to Discharge: Continued Medical Work up  Expected Discharge Plan and Services Expected Discharge Plan: Long Term Nursing Home In-house Referral: Clinical Social Work   Post Acute Care Choice: Nursing Home Living arrangements for the past 2 months:  Air cabin crew Health Care / Long Term Care Facility)                                       Social Determinants of Health (SDOH) Interventions    Readmission Risk Interventions No flowsheet data found.

## 2021-03-17 NOTE — Anesthesia Preprocedure Evaluation (Signed)
Anesthesia Evaluation  Patient identified by MRN, date of birth, ID band Patient confused    Reviewed: Allergy & Precautions, NPO status , Patient's Chart, lab work & pertinent test results  History of Anesthesia Complications Negative for: history of anesthetic complications  Airway Mallampati: IV   Neck ROM: Full    Dental no notable dental hx.    Pulmonary Current Smoker and Patient abstained from smoking.,    Pulmonary exam normal breath sounds clear to auscultation       Cardiovascular hypertension, + CAD  Normal cardiovascular exam Rhythm:Regular Rate:Normal  ECG 01/11/21: Sinus tachycardia, LVH, repolarization abnormality, prolonged QT   Neuro/Psych PSYCHIATRIC DISORDERS (with catatonia) Depression Schizophrenia negative neurological ROS     GI/Hepatic GERD  ,  Endo/Other  diabetes, Type 2, Insulin Dependent  Renal/GU      Musculoskeletal   Abdominal   Peds  Hematology negative hematology ROS (+)   Anesthesia Other Findings FTT  Reproductive/Obstetrics                             Anesthesia Physical Anesthesia Plan  ASA: 3  Anesthesia Plan: General   Post-op Pain Management:    Induction: Intravenous  PONV Risk Score and Plan: 2 and TIVA and Treatment may vary due to age or medical condition  Airway Management Planned: Natural Airway  Additional Equipment:   Intra-op Plan:   Post-operative Plan:   Informed Consent: I have reviewed the patients History and Physical, chart, labs and discussed the procedure including the risks, benefits and alternatives for the proposed anesthesia with the patient or authorized representative who has indicated his/her understanding and acceptance.       Plan Discussed with: CRNA  Anesthesia Plan Comments:         Anesthesia Quick Evaluation

## 2021-03-17 NOTE — Progress Notes (Signed)
[facility] MD Progress Note  03/17/2021 4:36 PM Erica Mueller  MRN:  [id number] Subjective: Follow-up 70 year old woman past history of schizophrenia current ongoing catatonia.  Patient had ECT today.  Treatment itself was unproblematic.  Both before and after the treatment the patient remains decompensated from how she was last week.  She is no longer responding verbally.  She will not follow even simple commands.  Her eyes are open and makes eye contact sometimes vocalizes slightly but cannot carry on any conversation.  Remains physically contracted not cooperative or eating well. Principal Problem: Catatonia Diagnosis: Principal Problem:   Catatonia Active Problems:   Type 2 diabetes mellitus with hyperglycemia, without long-term current use of insulin (HCC)   Essential hypertension   Schizophrenia (HCC)   Sepsis (HCC)   Pressure injury of skin   Protein-calorie malnutrition, severe   Failure to thrive in adult   Hypernatremia   Hypokalemia   AKI (acute kidney injury) (HCC)   COVID-19 virus infection   PEG tube malfunction (HCC)   Hypophosphatemia  Total Time spent with patient: 30 minutes  Past Psychiatric History: Past history of schizophrenia as far as we know this is the first episode of catatonia and it followed an episode of severe illness  Past Medical History:  Past Medical History:  Diagnosis Date   Coronary artery disease    Depression    Diabetes mellitus without complication (HCC)    Elevated lipids    GERD (gastroesophageal reflux disease)    Hypertension    Schizophrenia (HCC)    Syphilis (acquired)     Past Surgical History:  Procedure Laterality Date   COLONOSCOPY     COLONOSCOPY WITH PROPOFOL N/A 09/01/2015   Procedure: COLONOSCOPY WITH PROPOFOL;  Surgeon: Wallace Cullens, MD;  Location: [facility] ENDOSCOPY;  Service: Gastroenterology;  Laterality: N/A;   IR FLUORO RM 30-60 MIN  02/23/2021   IR GASTROSTOMY TUBE MOD SED  02/24/2021   IR REPLC GASTRO/COLONIC TUBE PERCUT  W/FLUORO  02/27/2021   TUBAL LIGATION     wisdom teeth removal     Family History:  Family History  Problem Relation Age of Onset   Cancer Mother    Alcoholism Mother    Bone cancer Father    Heart disease Father    Diabetes Maternal Grandmother    Suicidality Paternal Uncle    Breast cancer Neg Hx    Family Psychiatric  History: Father killed himself Social History:  Social History   Substance and Sexual Activity  Alcohol Use Yes   Alcohol/week: 3.0 standard drinks   Types: 2 Cans of beer, 1 Shots of liquor per week     Social History   Substance and Sexual Activity  Drug Use Not Currently   Types: Marijuana   Comment: PAST     Social History   Socioeconomic History   Marital status: Divorced    Spouse name: Not on file   Number of children: Not on file   Years of education: Not on file   Highest education level: Not on file  Occupational History   Not on file  Tobacco Use   Smoking status: Every Day    Packs/day: 1.00    Years: 48.00    Pack years: 48.00    Types: Cigarettes    Start date: 12/20/1969   Smokeless tobacco: Never  Vaping Use   Vaping Use: Never used  Substance and Sexual Activity   Alcohol use: Yes    Alcohol/week: 3.0 standard drinks  Types: 2 Cans of beer, 1 Shots of liquor per week   Drug use: Not Currently    Types: Marijuana    Comment: PAST    Sexual activity: Not Currently    Birth control/protection: None  Other Topics Concern   Not on file  Social History Narrative   Not on file   Social Determinants of Health   Financial Resource Strain: Not on file  Food Insecurity: Not on file  Transportation Needs: Not on file  Physical Activity: Not on file  Stress: Not on file  Social Connections: Not on file   Additional Social History:                         Sleep: Fair  Appetite:  Poor  Current Medications: Current Facility-Administered Medications  Medication Dose Route Frequency Provider Last Rate Last  Admin   acetaminophen (TYLENOL) suppository 650 mg  650 mg Rectal Q4H PRN Jimmye Norman, NP       acetaminophen (TYLENOL) tablet 650 mg  650 mg Oral Q4H PRN Harlon Ditty D, NP       ascorbic acid (VITAMIN C) tablet 500 mg  500 mg Per Tube Daily Charise Killian, MD   500 mg at 03/17/21 1446   chlorhexidine (PERIDEX) 0.12 % solution 15 mL  15 mL Mouth Rinse BID Alberteen Sam, MD   15 mL at 03/17/21 0800   Chlorhexidine Gluconate Cloth 2 % PADS 6 each  6 each Topical Daily Erin Fulling, MD   6 each at 03/17/21 1447   cholecalciferol (VITAMIN D3) tablet 1,000 Units  1,000 Units Oral Daily [name], MD   1,000 Units at 03/17/21 1446   docusate sodium (COLACE) capsule 100 mg  100 mg Oral BID PRN Judithe Modest, NP       enoxaparin (LOVENOX) injection 40 mg  40 mg Subcutaneous Q24H Bari Mantis A, RPH   40 mg at 03/16/21 2251   feeding supplement (NEPRO CARB STEADY) liquid 237 mL  237 mL Oral BID BM Lynn Ito, MD   237 mL at 03/17/21 1449   feeding supplement (OSMOLITE 1.2 CAL) liquid 1,000 mL  1,000 mL Per Tube Continuous [name], MD 60 mL/hr at 03/17/21 1459 1,000 mL at 03/17/21 1459   free water 60 mL  60 mL Per Tube Q4H Lynn Ito, MD   60 mL at 03/16/21 2253   glimepiride (AMARYL) tablet 2 mg  2 mg Oral Daily [name], MD   2 mg at 03/17/21 1445   insulin aspart (novoLOG) injection 0-9 Units  0-9 Units Subcutaneous Q4H Alford Highland, MD   1 Units at 03/16/21 2329   MEDLINE mouth rinse  15 mL Mouth Rinse q12n4p Danford, Earl Lites, MD   15 mL at 03/16/21 1712   metoprolol tartrate (LOPRESSOR) 25 mg/10 mL oral suspension 12.5 mg  12.5 mg Per Tube BID Alford Highland, MD   12.5 mg at 03/16/21 2330   ondansetron (ZOFRAN) injection 4 mg  4 mg Intravenous Q6H PRN Judithe Modest, NP       polyethylene glycol (MIRALAX / GLYCOLAX) packet 17 g  17 g Oral Daily [name], MD   17 g at 03/17/21 1445   pravastatin (PRAVACHOL) tablet 40 mg  40 mg Oral  q1800 [name], MD   40 mg at 03/16/21 1711   senna-docusate (Senokot-S) tablet 1 tablet  1 tablet Oral BID [name], MD   1 tablet  at 03/16/21 1711   thiothixene (NAVANE) capsule 2 mg  2 mg Per Tube BID Abbas Beyene, Jackquline Denmark, MD   2 mg at 03/17/21 1445    Lab Results:  Results for orders placed or performed during the hospital encounter of 02/07/21 (from the past 48 hour(s))  Glucose, capillary     Status: Abnormal   Collection Time: 03/15/21  8:45 PM  Result Value Ref Range   Glucose-Capillary 231 (H) 70 - 99 mg/dL    Comment: Glucose reference range applies only to samples taken after fasting for at least 8 hours.   Comment 1 Notify RN   Glucose, capillary     Status: Abnormal   Collection Time: 03/15/21 11:11 PM  Result Value Ref Range   Glucose-Capillary 182 (H) 70 - 99 mg/dL    Comment: Glucose reference range applies only to samples taken after fasting for at least 8 hours.  Basic metabolic panel     Status: Abnormal   Collection Time: 03/16/21  2:42 AM  Result Value Ref Range   Sodium 136 135 - 145 mmol/L   Potassium 4.4 3.5 - 5.1 mmol/L   Chloride 103 98 - 111 mmol/L   CO2 27 22 - 32 mmol/L   Glucose, Bld 222 (H) 70 - 99 mg/dL    Comment: Glucose reference range applies only to samples taken after fasting for at least 8 hours.   BUN 29 (H) 8 - 23 mg/dL   Creatinine, Ser 0.67 0.44 - 1.00 mg/dL   Calcium 9.3 8.9 - 70.3 mg/dL   GFR, Estimated >40 >35 mL/min    Comment: (NOTE) Calculated using the CKD-EPI Creatinine Equation (2021)    Anion gap 6 5 - 15    Comment: Performed at [facility], [address] [place], [place] [address]  CBC     Status: Abnormal   Collection Time: 03/16/21  2:42 AM  Result Value Ref Range   WBC 4.5 4.0 - 10.5 K/uL   RBC 3.38 (L) 3.87 - 5.11 MIL/uL   Hemoglobin 10.7 (L) 12.0 - 15.0 g/dL   HCT 59.0 (L) 93.1 - 12.1 %   MCV 97.6 80.0 - 100.0 fL   MCH 31.7 26.0 - 34.0 pg   MCHC 32.4 30.0 - 36.0 g/dL   RDW 62.4 46.9 - 50.7 %    Platelets 316 150 - 400 K/uL   nRBC 0.0 0.0 - 0.2 %    Comment: Performed at [facility], [address] [place], [place] [address]  Glucose, capillary     Status: Abnormal   Collection Time: 03/16/21  4:08 AM  Result Value Ref Range   Glucose-Capillary 246 (H) 70 - 99 mg/dL    Comment: Glucose reference range applies only to samples taken after fasting for at least 8 hours.  Glucose, capillary     Status: Abnormal   Collection Time: 03/16/21  8:16 AM  Result Value Ref Range   Glucose-Capillary 227 (H) 70 - 99 mg/dL    Comment: Glucose reference range applies only to samples taken after fasting for at least 8 hours.  Glucose, capillary     Status: Abnormal   Collection Time: 03/16/21 10:27 AM  Result Value Ref Range   Glucose-Capillary 256 (H) 70 - 99 mg/dL    Comment: Glucose reference range applies only to samples taken after fasting for at least 8 hours.  Glucose, capillary     Status: Abnormal   Collection Time: 03/16/21 11:19 AM  Result Value Ref  Range   Glucose-Capillary 265 (H) 70 - 99 mg/dL    Comment: Glucose reference range applies only to samples taken after fasting for at least 8 hours.   Comment 1 Notify RN    Comment 2 Document in Chart   Resp Panel by RT-PCR (Flu A&B, Covid) Nasopharyngeal Swab     Status: None   Collection Time: 03/16/21  2:40 PM   Specimen: Nasopharyngeal Swab; Nasopharyngeal(NP) swabs in vial transport medium  Result Value Ref Range   SARS Coronavirus 2 by RT PCR NEGATIVE NEGATIVE    Comment: (NOTE) SARS-CoV-2 target nucleic acids are NOT DETECTED.  The SARS-CoV-2 RNA is generally detectable in upper respiratory specimens during the acute phase of infection. The lowest concentration of SARS-CoV-2 viral copies this assay can detect is 138 copies/mL. A negative result does not preclude SARS-Cov-2 infection and should not be used as the sole basis for treatment or other patient management decisions. A negative result may occur with   improper specimen collection/handling, submission of specimen other than nasopharyngeal swab, presence of viral mutation(s) within the areas targeted by this assay, and inadequate number of viral copies(<138 copies/mL). A negative result must be combined with clinical observations, patient history, and epidemiological information. The expected result is Negative.  Fact Sheet for Patients:  BloggerCourse.com  Fact Sheet for Healthcare Providers:  SeriousBroker.it  This test is no t yet approved or cleared by the [place] FDA and  has been authorized for detection and/or diagnosis of SARS-CoV-2 by FDA under an Emergency Use Authorization (EUA). This EUA will remain  in effect (meaning this test can be used) for the duration of the COVID-19 declaration under Section 564(b)(1) of the Act, 21 U.S.C.section 360bbb-3(b)(1), unless the authorization is terminated  or revoked sooner.       Influenza A by PCR NEGATIVE NEGATIVE   Influenza B by PCR NEGATIVE NEGATIVE    Comment: (NOTE) The Xpert Xpress SARS-CoV-2/FLU/RSV plus assay is intended as an aid in the diagnosis of influenza from Nasopharyngeal swab specimens and should not be used as a sole basis for treatment. Nasal washings and aspirates are unacceptable for Xpert Xpress SARS-CoV-2/FLU/RSV testing.  Fact Sheet for Patients: BloggerCourse.com  Fact Sheet for Healthcare Providers: SeriousBroker.it  This test is not yet approved or cleared by the [place] FDA and has been authorized for detection and/or diagnosis of SARS-CoV-2 by FDA under an Emergency Use Authorization (EUA). This EUA will remain in effect (meaning this test can be used) for the duration of the COVID-19 declaration under Section 564(b)(1) of the Act, 21 U.S.C. section 360bbb-3(b)(1), unless the authorization is terminated or revoked.  Performed at  [facility], [address] [place], [place] [address]   Glucose, capillary     Status: Abnormal   Collection Time: 03/16/21  5:47 PM  Result Value Ref Range   Glucose-Capillary 276 (H) 70 - 99 mg/dL    Comment: Glucose reference range applies only to samples taken after fasting for at least 8 hours.  Glucose, capillary     Status: Abnormal   Collection Time: 03/16/21  9:50 PM  Result Value Ref Range   Glucose-Capillary 108 (H) 70 - 99 mg/dL    Comment: Glucose reference range applies only to samples taken after fasting for at least 8 hours.   Comment 1 Notify RN   Glucose, capillary     Status: Abnormal   Collection Time: 03/16/21 11:17 PM  Result Value Ref Range   Glucose-Capillary 145 (H) 70 -  99 mg/dL    Comment: Glucose reference range applies only to samples taken after fasting for at least 8 hours.   Comment 1 Notify RN   Glucose, capillary     Status: Abnormal   Collection Time: 03/17/21  3:53 AM  Result Value Ref Range   Glucose-Capillary 115 (H) 70 - 99 mg/dL    Comment: Glucose reference range applies only to samples taken after fasting for at least 8 hours.   Comment 1 Notify RN   Glucose, capillary     Status: Abnormal   Collection Time: 03/17/21  7:49 AM  Result Value Ref Range   Glucose-Capillary 126 (H) 70 - 99 mg/dL    Comment: Glucose reference range applies only to samples taken after fasting for at least 8 hours.    Blood Alcohol level:  Lab Results  Component Value Date   ETH <10 10/15/2017   ETH <10 10/01/2017    Metabolic Disorder Labs: Lab Results  Component Value Date   HGBA1C 7.9 (H) 02/07/2021   MPG 180 02/07/2021   MPG 146 11/08/2020   No results found for: PROLACTIN Lab Results  Component Value Date   CHOL 136 10/16/2017   TRIG 90 10/16/2017   HDL 56 10/16/2017   CHOLHDL 2.4 10/16/2017   VLDL 18 10/16/2017   LDLCALC 62 10/16/2017   LDLCALC 62 05/27/2017    Physical Findings: AIMS:  , ,  ,  ,    CIWA:    COWS:      Musculoskeletal: Strength & Muscle Tone: decreased Gait & Station: unable to stand Patient leans: N/A  Psychiatric Specialty Exam:  Presentation  General Appearance:  No data recorded Eye Contact: No data recorded Speech: No data recorded Speech Volume: No data recorded Handedness: No data recorded  Mood and Affect  Mood: No data recorded Affect: No data recorded  Thought Process  Thought Processes: No data recorded Descriptions of Associations:No data recorded Orientation:No data recorded Thought Content:No data recorded History of Schizophrenia/Schizoaffective disorder:No data recorded Duration of Psychotic Symptoms:No data recorded Hallucinations:No data recorded Ideas of Reference:No data recorded Suicidal Thoughts:No data recorded Homicidal Thoughts:No data recorded  Sensorium  Memory: No data recorded Judgment: No data recorded Insight: No data recorded  Executive Functions  Concentration: No data recorded Attention Span: No data recorded Recall: No data recorded Fund of Knowledge: No data recorded Language: No data recorded  Psychomotor Activity  Psychomotor Activity: No data recorded  Assets  Assets: No data recorded  Sleep  Sleep: No data recorded   Physical Exam: Physical Exam Vitals and nursing note reviewed.  Constitutional:      Appearance: She is ill-appearing.  HENT:     Head: Normocephalic and atraumatic.     Mouth/Throat:     Pharynx: Oropharynx is clear.  Eyes:     Pupils: Pupils are equal, round, and reactive to light.  Cardiovascular:     Rate and Rhythm: Normal rate and regular rhythm.  Pulmonary:     Effort: Pulmonary effort is normal.     Breath sounds: Normal breath sounds.  Abdominal:     General: Abdomen is flat.     Palpations: Abdomen is soft.  Musculoskeletal:        General: Normal range of motion.  Skin:    General: Skin is warm and dry.  Neurological:     General: No focal deficit  present.     Mental Status: Mental status is at baseline.  Psychiatric:  Attention and Perception: She is inattentive.        Mood and Affect: Affect is blunt.        Speech: She is noncommunicative.   Review of Systems  Unable to perform ROS: Psychiatric disorder  Blood pressure (!) 156/52, pulse 92, temperature 98.6 F (37 C), resp. rate 18, height 5\' 2"  (1.575 m), weight 46.5 kg, SpO2 100 %. Body mass index is 18.75 kg/m.   Treatment Plan Summary: Plan patient seems to have decompensated again after what looks like some early response to ECT.  As of last weekend I had thought that she might be out of the woods of catatonia and on her way to generally recovering but she has slipped back and is not able to participate in physical therapy and is not eating well.  This is why we decided to initiate ECT again.  I will follow up and we have her on the schedule for Monday.  Still not clear what baseline she will be returning to certainly possible she may have suffered some lasting cognitive impairment from her extended illness in the ICU  Wednesday, MD 03/17/2021, 4:36 PM

## 2021-03-17 NOTE — H&P (Signed)
Erica Mueller is an 70 y.o. female.   Chief Complaint: low energy HPI: schizoaffective with recent catatonia  Past Medical History:  Diagnosis Date   Coronary artery disease    Depression    Diabetes mellitus without complication (HCC)    Elevated lipids    GERD (gastroesophageal reflux disease)    Hypertension    Schizophrenia (HCC)    Syphilis (acquired)     Past Surgical History:  Procedure Laterality Date   COLONOSCOPY     COLONOSCOPY WITH PROPOFOL N/A 09/01/2015   Procedure: COLONOSCOPY WITH PROPOFOL;  Surgeon: Wallace Cullens, MD;  Location: [facility] ENDOSCOPY;  Service: Gastroenterology;  Laterality: N/A;   IR FLUORO RM 30-60 MIN  02/23/2021   IR GASTROSTOMY TUBE MOD SED  02/24/2021   IR REPLC GASTRO/COLONIC TUBE PERCUT W/FLUORO  02/27/2021   TUBAL LIGATION     wisdom teeth removal      Family History  Problem Relation Age of Onset   Cancer Mother    Alcoholism Mother    Bone cancer Father    Heart disease Father    Diabetes Maternal Grandmother    Suicidality Paternal Uncle    Breast cancer Neg Hx    Social History:  reports that she has been smoking cigarettes. She started smoking about 51 years ago. She has a 48.00 pack-year smoking history. She has never used smokeless tobacco. She reports current alcohol use of about 3.0 standard drinks per week. She reports that she does not currently use drugs after having used the following drugs: Marijuana.  Allergies: No Known Allergies  Medications Prior to Admission  Medication Sig Dispense Refill   amitriptyline (ELAVIL) 25 MG tablet Take 1 tablet (25 mg total) by mouth at bedtime. 14 tablet 0   chlorproMAZINE (THORAZINE) 25 MG tablet Take 25 mg by mouth 2 (two) times daily.     cholecalciferol (VITAMIN D3) 25 MCG (1000 UNIT) tablet Take 1,000 Units by mouth daily.     glimepiride (AMARYL) 2 MG tablet Take 2 mg by mouth daily.     lovastatin (MEVACOR) 40 MG tablet Take 1 tablet (40 mg total) by mouth at bedtime. 60 tablet 2    metFORMIN (GLUCOPHAGE) 1000 MG tablet Take 1,000 mg by mouth 2 (two) times daily.     metoprolol tartrate (LOPRESSOR) 25 MG tablet Take 1 tablet (25 mg total) by mouth 2 (two) times daily. 60 tablet 0   paliperidone (INVEGA) 6 MG 24 hr tablet Take 6 mg by mouth daily.     polyethylene glycol (MIRALAX / GLYCOLAX) 17 g packet Take 17 g by mouth daily.     senna-docusate (SENOKOT-S) 8.6-50 MG tablet Take 1 tablet by mouth at bedtime. 30 tablet 0   senna-docusate (SENOKOT-S) 8.6-50 MG tablet Take 1 tablet by mouth 2 (two) times daily. (0800 and 1600)     thiothixene (NAVANE) 2 MG capsule Take 1 capsule (2 mg total) by mouth 2 (two) times daily. 28 capsule 0   vitamin C (ASCORBIC ACID) 500 MG tablet Take 500 mg by mouth 2 (two) times daily.     zinc gluconate 50 MG tablet Take 50 mg by mouth daily.      Results for orders placed or performed during the hospital encounter of 02/07/21 (from the past 48 hour(s))  Glucose, capillary     Status: Abnormal   Collection Time: 03/15/21  3:49 PM  Result Value Ref Range   Glucose-Capillary 237 (H) 70 - 99 mg/dL    Comment:  Glucose reference range applies only to samples taken after fasting for at least 8 hours.  Glucose, capillary     Status: Abnormal   Collection Time: 03/15/21  8:45 PM  Result Value Ref Range   Glucose-Capillary 231 (H) 70 - 99 mg/dL    Comment: Glucose reference range applies only to samples taken after fasting for at least 8 hours.   Comment 1 Notify RN   Glucose, capillary     Status: Abnormal   Collection Time: 03/15/21 11:11 PM  Result Value Ref Range   Glucose-Capillary 182 (H) 70 - 99 mg/dL    Comment: Glucose reference range applies only to samples taken after fasting for at least 8 hours.  Basic metabolic panel     Status: Abnormal   Collection Time: 03/16/21  2:42 AM  Result Value Ref Range   Sodium 136 135 - 145 mmol/L   Potassium 4.4 3.5 - 5.1 mmol/L   Chloride 103 98 - 111 mmol/L   CO2 27 22 - 32 mmol/L   Glucose,  Bld 222 (H) 70 - 99 mg/dL    Comment: Glucose reference range applies only to samples taken after fasting for at least 8 hours.   BUN 29 (H) 8 - 23 mg/dL   Creatinine, Ser 9.32 0.44 - 1.00 mg/dL   Calcium 9.3 8.9 - 35.5 mg/dL   GFR, Estimated >73 >22 mL/min    Comment: (NOTE) Calculated using the CKD-EPI Creatinine Equation (2021)    Anion gap 6 5 - 15    Comment: Performed at [facility], [address] [place], [place] [address]  CBC     Status: Abnormal   Collection Time: 03/16/21  2:42 AM  Result Value Ref Range   WBC 4.5 4.0 - 10.5 K/uL   RBC 3.38 (L) 3.87 - 5.11 MIL/uL   Hemoglobin 10.7 (L) 12.0 - 15.0 g/dL   HCT 70.6 (L) 23.7 - 62.8 %   MCV 97.6 80.0 - 100.0 fL   MCH 31.7 26.0 - 34.0 pg   MCHC 32.4 30.0 - 36.0 g/dL   RDW 31.5 17.6 - 16.0 %   Platelets 316 150 - 400 K/uL   nRBC 0.0 0.0 - 0.2 %    Comment: Performed at [facility], [address] [place], [place] [address]  Glucose, capillary     Status: Abnormal   Collection Time: 03/16/21  4:08 AM  Result Value Ref Range   Glucose-Capillary 246 (H) 70 - 99 mg/dL    Comment: Glucose reference range applies only to samples taken after fasting for at least 8 hours.  Glucose, capillary     Status: Abnormal   Collection Time: 03/16/21  8:16 AM  Result Value Ref Range   Glucose-Capillary 227 (H) 70 - 99 mg/dL    Comment: Glucose reference range applies only to samples taken after fasting for at least 8 hours.  Glucose, capillary     Status: Abnormal   Collection Time: 03/16/21 10:27 AM  Result Value Ref Range   Glucose-Capillary 256 (H) 70 - 99 mg/dL    Comment: Glucose reference range applies only to samples taken after fasting for at least 8 hours.  Glucose, capillary     Status: Abnormal   Collection Time: 03/16/21 11:19 AM  Result Value Ref Range   Glucose-Capillary 265 (H) 70 - 99 mg/dL    Comment: Glucose reference range applies only to samples taken after fasting for at least 8 hours.    Comment 1 Notify  RN    Comment 2 Document in Chart   Resp Panel by RT-PCR (Flu A&B, Covid) Nasopharyngeal Swab     Status: None   Collection Time: 03/16/21  2:40 PM   Specimen: Nasopharyngeal Swab; Nasopharyngeal(NP) swabs in vial transport medium  Result Value Ref Range   SARS Coronavirus 2 by RT PCR NEGATIVE NEGATIVE    Comment: (NOTE) SARS-CoV-2 target nucleic acids are NOT DETECTED.  The SARS-CoV-2 RNA is generally detectable in upper respiratory specimens during the acute phase of infection. The lowest concentration of SARS-CoV-2 viral copies this assay can detect is 138 copies/mL. A negative result does not preclude SARS-Cov-2 infection and should not be used as the sole basis for treatment or other patient management decisions. A negative result may occur with  improper specimen collection/handling, submission of specimen other than nasopharyngeal swab, presence of viral mutation(s) within the areas targeted by this assay, and inadequate number of viral copies(<138 copies/mL). A negative result must be combined with clinical observations, patient history, and epidemiological information. The expected result is Negative.  Fact Sheet for Patients:  BloggerCourse.com  Fact Sheet for Healthcare Providers:  SeriousBroker.it  This test is no t yet approved or cleared by the [place] FDA and  has been authorized for detection and/or diagnosis of SARS-CoV-2 by FDA under an Emergency Use Authorization (EUA). This EUA will remain  in effect (meaning this test can be used) for the duration of the COVID-19 declaration under Section 564(b)(1) of the Act, 21 U.S.C.section 360bbb-3(b)(1), unless the authorization is terminated  or revoked sooner.       Influenza A by PCR NEGATIVE NEGATIVE   Influenza B by PCR NEGATIVE NEGATIVE    Comment: (NOTE) The Xpert Xpress SARS-CoV-2/FLU/RSV plus assay is intended as an aid in the  diagnosis of influenza from Nasopharyngeal swab specimens and should not be used as a sole basis for treatment. Nasal washings and aspirates are unacceptable for Xpert Xpress SARS-CoV-2/FLU/RSV testing.  Fact Sheet for Patients: BloggerCourse.com  Fact Sheet for Healthcare Providers: SeriousBroker.it  This test is not yet approved or cleared by the [place] FDA and has been authorized for detection and/or diagnosis of SARS-CoV-2 by FDA under an Emergency Use Authorization (EUA). This EUA will remain in effect (meaning this test can be used) for the duration of the COVID-19 declaration under Section 564(b)(1) of the Act, 21 U.S.C. section 360bbb-3(b)(1), unless the authorization is terminated or revoked.  Performed at [facility], [address] [place], [place] [address]   Glucose, capillary     Status: Abnormal   Collection Time: 03/16/21  5:47 PM  Result Value Ref Range   Glucose-Capillary 276 (H) 70 - 99 mg/dL    Comment: Glucose reference range applies only to samples taken after fasting for at least 8 hours.  Glucose, capillary     Status: Abnormal   Collection Time: 03/16/21  9:50 PM  Result Value Ref Range   Glucose-Capillary 108 (H) 70 - 99 mg/dL    Comment: Glucose reference range applies only to samples taken after fasting for at least 8 hours.   Comment 1 Notify RN   Glucose, capillary     Status: Abnormal   Collection Time: 03/16/21 11:17 PM  Result Value Ref Range   Glucose-Capillary 145 (H) 70 - 99 mg/dL    Comment: Glucose reference range applies only to samples taken after fasting for at least 8 hours.   Comment 1 Notify RN   Glucose, capillary  Status: Abnormal   Collection Time: 03/17/21  3:53 AM  Result Value Ref Range   Glucose-Capillary 115 (H) 70 - 99 mg/dL    Comment: Glucose reference range applies only to samples taken after fasting for at least 8 hours.   Comment 1 Notify RN    Glucose, capillary     Status: Abnormal   Collection Time: 03/17/21  7:49 AM  Result Value Ref Range   Glucose-Capillary 126 (H) 70 - 99 mg/dL    Comment: Glucose reference range applies only to samples taken after fasting for at least 8 hours.   No results found.  Review of Systems  Constitutional:  Positive for appetite change.  HENT: Negative.    Eyes: Negative.   Respiratory: Negative.    Cardiovascular: Negative.   Gastrointestinal: Negative.   Musculoskeletal: Negative.   Skin: Negative.   Neurological: Negative.   Psychiatric/Behavioral:  Positive for decreased concentration.    Blood pressure 92/62, pulse 75, temperature 98.9 F (37.2 C), temperature source Oral, resp. rate 15, height 5\' 2"  (1.575 m), weight 46.5 kg, SpO2 98 %. Physical Exam Vitals and nursing note reviewed.  Constitutional:      Appearance: She is well-developed. She is ill-appearing.  HENT:     Head: Normocephalic and atraumatic.  Eyes:     Conjunctiva/sclera: Conjunctivae normal.     Pupils: Pupils are equal, round, and reactive to light.  Cardiovascular:     Heart sounds: Normal heart sounds.  Pulmonary:     Effort: Pulmonary effort is normal.  Abdominal:     Palpations: Abdomen is soft.  Musculoskeletal:        General: Normal range of motion.     Cervical back: Normal range of motion.  Skin:    General: Skin is warm and dry.  Neurological:     Mental Status: She is alert.  Psychiatric:        Attention and Perception: She is inattentive.        Mood and Affect: Affect is blunt.        Speech: Speech is delayed.        Behavior: Behavior is slowed.        Thought Content: Thought content does not include suicidal ideation.        Cognition and Memory: Cognition is impaired. Memory is impaired.     Assessment/Plan Restart ect as patient appears worse this week  , MD 03/17/2021, 12:05 PM

## 2021-03-17 NOTE — Progress Notes (Signed)
Occupational Therapy Treatment Patient Details Name: Erica Mueller MRN: [id number] DOB: 08-18-1950 Today's Date: 03/17/2021   History of present illness Pt is a 70 y/o F with PMH:  schizophrenia, HTN, GERD, type 2 diabetes mellitus,  depression and CAD. Pt presented on 02/07/21 d/t severe sepsis 2/2 COVID-19 infection. PEG tube placed 9/16 and tube changed 9/19. Pt has been catatonic. Phsychiatry started pt back on her baseline psych meds as well as ECT. Pt has become more interactive.   OT comments  Pt seen for OT Tx this date to f/u re: safety with ADLs/ADL mobility. OT engages pt in oral care and washing face with multimodal cues, MIN/MOD A for task and MIN A for sitting balance. Pt requires MAX A to come to sitting and MAX to TOTAL A to return to bed to manage BLE. Pt's bed transitioned into chair position and alarm set. Lights and TV left on to stimulate pt during waking hours to continue to progress interactions and develop more appropriate sleep/wake cycle. Bed alarm set, call light in reach. Continue to recommend STR f/u.    Recommendations for follow up therapy are one component of a multi-disciplinary discharge planning process, led by the attending physician.  Recommendations may be updated based on patient status, additional functional criteria and insurance authorization.    Follow Up Recommendations  SNF    Equipment Recommendations  Other (comment) (defer)    Recommendations for Other Services      Precautions / Restrictions Precautions Precautions: Fall Restrictions Weight Bearing Restrictions: No       Mobility Bed Mobility Overal bed mobility: Needs Assistance Bed Mobility: Supine to Sit;Sit to Supine     Supine to sit: Max assist;HOB elevated Sit to supine: Max assist;Total assist   General bed mobility comments: increased time, cues for sequencing use of bed rails. Increased assist to manage LEs for back to bed    Transfers                 General  transfer comment: deferred    Balance Overall balance assessment: Needs assistance Sitting-balance support: Feet supported;Single extremity supported Sitting balance-Leahy Scale: Poor Sitting balance - Comments: Posterior lean, requires MIN A for support       Standing balance comment: deferred                           ADL either performed or assessed with clinical judgement   ADL Overall ADL's : Needs assistance/impaired     Grooming: Wash/dry face;Oral care;Moderate assistance;Sitting;Cueing for sequencing Grooming Details (indicate cue type and reason): MIN/MOD A intermittently for EOB sitting balanace and moderate tactile/verbal cues for oral care (correct use of swab)                                     Vision       Perception     Praxis      Cognition Arousal/Alertness: Awake/alert Behavior During Therapy: Flat affect;WFL for tasks assessed/performed Overall Cognitive Status: No family/caregiver present to determine baseline cognitive functioning                                 General Comments: requires multimodal cueing, follows ~40-50% simple one step commands with increased time. Responds best seemingly to tactile cues. Cooperative, but with waxing and  waning attn to task.        Exercises Other Exercises Other Exercises: OT engages pt in oral care and washing face with multimodal cues, MIN/MOD A for task and MIN A for sitting balance.   Shoulder Instructions       General Comments      Pertinent Vitals/ Pain       Pain Assessment: Faces Faces Pain Scale: No hurt  Home Living                                          Prior Functioning/Environment              Frequency  Min 1X/week        Progress Toward Goals  OT Goals(current goals can now be found in the care plan section)  Progress towards OT goals: Progressing toward goals  Acute Rehab OT Goals Patient Stated Goal: none  stated OT Goal Formulation: Patient unable to participate in goal setting Time For Goal Achievement: 03/28/21 Potential to Achieve Goals: Fair  Plan Discharge plan remains appropriate    Co-evaluation                 AM-PAC OT "6 Clicks" Daily Activity     Outcome Measure   Help from another person eating meals?: A Little Help from another person taking care of personal grooming?: A Little Help from another person toileting, which includes using toliet, bedpan, or urinal?: Total Help from another person bathing (including washing, rinsing, drying)?: A Lot Help from another person to put on and taking off regular upper body clothing?: A Lot Help from another person to put on and taking off regular lower body clothing?: Total 6 Click Score: 12    End of Session Equipment Utilized During Treatment: Gait belt;Rolling walker  OT Visit Diagnosis: Unsteadiness on feet (R26.81);Other symptoms and signs involving cognitive function   Activity Tolerance Patient tolerated treatment well   Patient Left in bed;with call bell/phone within reach;with bed alarm set;Other (comment) (chair position)   Nurse Communication Mobility status        Time: [phone] OT Time Calculation (min): 12 min  Charges: OT General Charges $OT Visit: 1 Visit OT Treatments $Self Care/Home Management : 8-22 mins  Rejeana Brock, MS, OTR/L ascom [phone] 03/17/21, 1:28 PM

## 2021-03-18 DIAGNOSIS — F061 Catatonic disorder due to known physiological condition: Secondary | ICD-10-CM | POA: Diagnosis not present

## 2021-03-18 NOTE — Consult Note (Signed)
[facility] Face-to-Face Psychiatry Consult   Reason for Consult: Follow-up consult 70 year old woman altered mental status. Referring Physician: Allena Katz Patient Identification: Erica Mueller MRN:  [id number] Principal Diagnosis: Catatonia Diagnosis:  Principal Problem:   Catatonia Active Problems:   Type 2 diabetes mellitus with hyperglycemia, without long-term current use of insulin (HCC)   Essential hypertension   Schizophrenia (HCC)   Sepsis (HCC)   Pressure injury of skin   Protein-calorie malnutrition, severe   Failure to thrive in adult   Hypernatremia   Hypokalemia   AKI (acute kidney injury) (HCC)   COVID-19 virus infection   PEG tube malfunction (HCC)   Hypophosphatemia   Total Time spent with patient: 30 minutes  Subjective:   Erica Mueller is a 70 y.o. female patient admitted with patient uncommunicative.  HPI: Patient had ECT yesterday first time in a week.  Tolerated well no complication.  On interview today the patient was arousable.  Vocalized a little but not regularly.  Could not answer questions.  Could not follow commands.  Moved her arms with some intentionality but otherwise not very active.  Unclear if she is eating.  Past Psychiatric History: Past history of schizoaffective disorder recent altered mental status persistent after pneumonia  Risk to Self:   Risk to Others:   Prior Inpatient Therapy:   Prior Outpatient Therapy:    Past Medical History:  Past Medical History:  Diagnosis Date   Coronary artery disease    Depression    Diabetes mellitus without complication (HCC)    Elevated lipids    GERD (gastroesophageal reflux disease)    Hypertension    Schizophrenia (HCC)    Syphilis (acquired)     Past Surgical History:  Procedure Laterality Date   COLONOSCOPY     COLONOSCOPY WITH PROPOFOL N/A 09/01/2015   Procedure: COLONOSCOPY WITH PROPOFOL;  Surgeon: Wallace Cullens, MD;  Location: [facility] ENDOSCOPY;  Service: Gastroenterology;  Laterality: N/A;   IR  FLUORO RM 30-60 MIN  02/23/2021   IR GASTROSTOMY TUBE MOD SED  02/24/2021   IR REPLC GASTRO/COLONIC TUBE PERCUT W/FLUORO  02/27/2021   TUBAL LIGATION     wisdom teeth removal     Family History:  Family History  Problem Relation Age of Onset   Cancer Mother    Alcoholism Mother    Bone cancer Father    Heart disease Father    Diabetes Maternal Grandmother    Suicidality Paternal Uncle    Breast cancer Neg Hx    Family Psychiatric  History: Father suicide Social History:  Social History   Substance and Sexual Activity  Alcohol Use Yes   Alcohol/week: 3.0 standard drinks   Types: 2 Cans of beer, 1 Shots of liquor per week     Social History   Substance and Sexual Activity  Drug Use Not Currently   Types: Marijuana   Comment: PAST     Social History   Socioeconomic History   Marital status: Divorced    Spouse name: Not on file   Number of children: Not on file   Years of education: Not on file   Highest education level: Not on file  Occupational History   Not on file  Tobacco Use   Smoking status: Every Day    Packs/day: 1.00    Years: 48.00    Pack years: 48.00    Types: Cigarettes    Start date: 12/20/1969   Smokeless tobacco: Never  Vaping Use   Vaping Use: Never used  Substance and Sexual Activity   Alcohol use: Yes    Alcohol/week: 3.0 standard drinks    Types: 2 Cans of beer, 1 Shots of liquor per week   Drug use: Not Currently    Types: Marijuana    Comment: PAST    Sexual activity: Not Currently    Birth control/protection: None  Other Topics Concern   Not on file  Social History Narrative   Not on file   Social Determinants of Health   Financial Resource Strain: Not on file  Food Insecurity: Not on file  Transportation Needs: Not on file  Physical Activity: Not on file  Stress: Not on file  Social Connections: Not on file   Additional Social History:    Allergies:  No Known Allergies  Labs:  Results for orders placed or performed  during the hospital encounter of 02/07/21 (from the past 48 hour(s))  Resp Panel by RT-PCR (Flu A&B, Covid) Nasopharyngeal Swab     Status: None   Collection Time: 03/16/21  2:40 PM   Specimen: Nasopharyngeal Swab; Nasopharyngeal(NP) swabs in vial transport medium  Result Value Ref Range   SARS Coronavirus 2 by RT PCR NEGATIVE NEGATIVE    Comment: (NOTE) SARS-CoV-2 target nucleic acids are NOT DETECTED.  The SARS-CoV-2 RNA is generally detectable in upper respiratory specimens during the acute phase of infection. The lowest concentration of SARS-CoV-2 viral copies this assay can detect is 138 copies/mL. A negative result does not preclude SARS-Cov-2 infection and should not be used as the sole basis for treatment or other patient management decisions. A negative result may occur with  improper specimen collection/handling, submission of specimen other than nasopharyngeal swab, presence of viral mutation(s) within the areas targeted by this assay, and inadequate number of viral copies(<138 copies/mL). A negative result must be combined with clinical observations, patient history, and epidemiological information. The expected result is Negative.  Fact Sheet for Patients:  BloggerCourse.com  Fact Sheet for Healthcare Providers:  SeriousBroker.it  This test is no t yet approved or cleared by the [place] FDA and  has been authorized for detection and/or diagnosis of SARS-CoV-2 by FDA under an Emergency Use Authorization (EUA). This EUA will remain  in effect (meaning this test can be used) for the duration of the COVID-19 declaration under Section 564(b)(1) of the Act, 21 U.S.C.section 360bbb-3(b)(1), unless the authorization is terminated  or revoked sooner.       Influenza A by PCR NEGATIVE NEGATIVE   Influenza B by PCR NEGATIVE NEGATIVE    Comment: (NOTE) The Xpert Xpress SARS-CoV-2/FLU/RSV plus assay is intended as an  aid in the diagnosis of influenza from Nasopharyngeal swab specimens and should not be used as a sole basis for treatment. Nasal washings and aspirates are unacceptable for Xpert Xpress SARS-CoV-2/FLU/RSV testing.  Fact Sheet for Patients: BloggerCourse.com  Fact Sheet for Healthcare Providers: SeriousBroker.it  This test is not yet approved or cleared by the [place] FDA and has been authorized for detection and/or diagnosis of SARS-CoV-2 by FDA under an Emergency Use Authorization (EUA). This EUA will remain in effect (meaning this test can be used) for the duration of the COVID-19 declaration under Section 564(b)(1) of the Act, 21 U.S.C. section 360bbb-3(b)(1), unless the authorization is terminated or revoked.  Performed at [facility], [address] [place], [place] [address]   Glucose, capillary     Status: Abnormal   Collection Time: 03/16/21  5:47 PM  Result Value Ref Range   Glucose-Capillary  276 (H) 70 - 99 mg/dL    Comment: Glucose reference range applies only to samples taken after fasting for at least 8 hours.  Glucose, capillary     Status: Abnormal   Collection Time: 03/16/21  9:50 PM  Result Value Ref Range   Glucose-Capillary 108 (H) 70 - 99 mg/dL    Comment: Glucose reference range applies only to samples taken after fasting for at least 8 hours.   Comment 1 Notify RN   Glucose, capillary     Status: Abnormal   Collection Time: 03/16/21 11:17 PM  Result Value Ref Range   Glucose-Capillary 145 (H) 70 - 99 mg/dL    Comment: Glucose reference range applies only to samples taken after fasting for at least 8 hours.   Comment 1 Notify RN   Glucose, capillary     Status: Abnormal   Collection Time: 03/17/21  3:53 AM  Result Value Ref Range   Glucose-Capillary 115 (H) 70 - 99 mg/dL    Comment: Glucose reference range applies only to samples taken after fasting for at least 8 hours.   Comment 1  Notify RN   Glucose, capillary     Status: Abnormal   Collection Time: 03/17/21  7:49 AM  Result Value Ref Range   Glucose-Capillary 126 (H) 70 - 99 mg/dL    Comment: Glucose reference range applies only to samples taken after fasting for at least 8 hours.    Current Facility-Administered Medications  Medication Dose Route Frequency Provider Last Rate Last Admin   acetaminophen (TYLENOL) suppository 650 mg  650 mg Rectal Q4H PRN Jimmye Norman, NP       acetaminophen (TYLENOL) tablet 650 mg  650 mg Oral Q4H PRN Harlon Ditty D, NP       ascorbic acid (VITAMIN C) tablet 500 mg  500 mg Per Tube Daily Charise Killian, MD   500 mg at 03/18/21 1017   chlorhexidine (PERIDEX) 0.12 % solution 15 mL  15 mL Mouth Rinse BID Alberteen Sam, MD   15 mL at 03/18/21 1017   Chlorhexidine Gluconate Cloth 2 % PADS 6 each  6 each Topical Daily Erin Fulling, MD   6 each at 03/18/21 1020   cholecalciferol (VITAMIN D3) tablet 1,000 Units  1,000 Units Oral Daily [name], MD   1,000 Units at 03/18/21 1017   docusate sodium (COLACE) capsule 100 mg  100 mg Oral BID PRN Judithe Modest, NP       enoxaparin (LOVENOX) injection 40 mg  40 mg Subcutaneous Q24H Bari Mantis A, RPH   40 mg at 03/18/21 0039   feeding supplement (NEPRO CARB STEADY) liquid 237 mL  237 mL Oral BID BM Amery, Freddi Che, MD   237 mL at 03/18/21 1020   feeding supplement (OSMOLITE 1.2 CAL) liquid 1,000 mL  1,000 mL Per Tube Continuous [name], MD 60 mL/hr at 03/17/21 1459 1,000 mL at 03/17/21 1459   free water 60 mL  60 mL Per Tube Q4H Lynn Ito, MD   60 mL at 03/18/21 0800   glimepiride (AMARYL) tablet 2 mg  2 mg Oral Daily [name], MD   2 mg at 03/18/21 1018   insulin aspart (novoLOG) injection 0-9 Units  0-9 Units Subcutaneous Q4H Alford Highland, MD   5 Units at 03/18/21 0430   MEDLINE mouth rinse  15 mL Mouth Rinse q12n4p Danford, Earl Lites, MD   15 mL at 03/17/21 1600   metoprolol tartrate  (LOPRESSOR) 25  mg/10 mL oral suspension 12.5 mg  12.5 mg Per Tube BID Alford Highland, MD   12.5 mg at 03/17/21 2229   ondansetron (ZOFRAN) injection 4 mg  4 mg Intravenous Q6H PRN Judithe Modest, NP       polyethylene glycol (MIRALAX / GLYCOLAX) packet 17 g  17 g Oral Daily [name], MD   17 g at 03/18/21 1018   pravastatin (PRAVACHOL) tablet 40 mg  40 mg Oral q1800 [name], MD   40 mg at 03/17/21 1730   senna-docusate (Senokot-S) tablet 1 tablet  1 tablet Oral BID [name], MD   1 tablet at 03/18/21 1018   thiothixene (NAVANE) capsule 2 mg  2 mg Per Tube BID Woodson Macha, Jackquline Denmark, MD   2 mg at 03/18/21 1018    Musculoskeletal: Strength & Muscle Tone: decreased Gait & Station: unable to stand Patient leans: N/A            Psychiatric Specialty Exam:  Presentation  General Appearance:  No data recorded Eye Contact: No data recorded Speech: No data recorded Speech Volume: No data recorded Handedness: No data recorded  Mood and Affect  Mood: No data recorded Affect: No data recorded  Thought Process  Thought Processes: No data recorded Descriptions of Associations:No data recorded Orientation:No data recorded Thought Content:No data recorded History of Schizophrenia/Schizoaffective disorder:No data recorded Duration of Psychotic Symptoms:No data recorded Hallucinations:No data recorded Ideas of Reference:No data recorded Suicidal Thoughts:No data recorded Homicidal Thoughts:No data recorded  Sensorium  Memory: No data recorded Judgment: No data recorded Insight: No data recorded  Executive Functions  Concentration: No data recorded Attention Span: No data recorded Recall: No data recorded Fund of Knowledge: No data recorded Language: No data recorded  Psychomotor Activity  Psychomotor Activity: No data recorded  Assets  Assets: No data recorded  Sleep  Sleep: No data recorded  Physical Exam: Physical Exam Vitals and  nursing note reviewed.  Constitutional:      Appearance: She is ill-appearing.  HENT:     Head: Normocephalic and atraumatic.     Mouth/Throat:     Pharynx: Oropharynx is clear.  Eyes:     Pupils: Pupils are equal, round, and reactive to light.  Cardiovascular:     Rate and Rhythm: Normal rate and regular rhythm.  Pulmonary:     Effort: Pulmonary effort is normal.     Breath sounds: Normal breath sounds.  Abdominal:     General: Abdomen is flat.     Palpations: Abdomen is soft.  Musculoskeletal:        General: Normal range of motion.  Skin:    General: Skin is warm and dry.  Neurological:     General: No focal deficit present.     Mental Status: Mental status is at baseline.  Psychiatric:        Attention and Perception: She is inattentive.        Mood and Affect: Mood normal. Affect is blunt.        Speech: She is noncommunicative.        Behavior: Behavior is withdrawn.        Cognition and Memory: Cognition is impaired.   Review of Systems  Unable to perform ROS: Patient unresponsive  Blood pressure (!) 133/48, pulse 89, temperature 98.1 F (36.7 C), temperature source Axillary, resp. rate 17, height 5\' 2"  (1.575 m), weight 46.5 kg, SpO2 97 %. Body mass index is 18.75 kg/m.  Treatment Plan Summary: Plan patient is  on the schedule for ECT on Monday.  I think it is worth continuing this at this point as she was seeming to show some improvement before.  Tried to encourage patient to eat and reminded her that this was crucial to any eventual recovery.  No change to current medicine.  Disposition:  See note  Mordecai Rasmussen, MD 03/18/2021 12:09 PM

## 2021-03-18 NOTE — Plan of Care (Addendum)
No acute events during the night. VSS. Patient has not been NPO since MN. TF was stopped at 0515.  Manuela Schwartz, FNP notified.  Problem: Education: Goal: Knowledge of General Education information will improve Description: Including pain rating scale, medication(s)/side effects and non-pharmacologic comfort measures Outcome: Progressing   Problem: Health Behavior/Discharge Planning: Goal: Ability to manage health-related needs will improve Outcome: Progressing   Problem: Clinical Measurements: Goal: Ability to maintain clinical measurements within normal limits will improve Outcome: Progressing Goal: Will remain free from infection Outcome: Progressing Goal: Diagnostic test results will improve Outcome: Progressing Goal: Respiratory complications will improve Outcome: Progressing Goal: Cardiovascular complication will be avoided Outcome: Progressing   Problem: Activity: Goal: Risk for activity intolerance will decrease Outcome: Progressing   Problem: Nutrition: Goal: Adequate nutrition will be maintained Outcome: Progressing   Problem: Coping: Goal: Level of anxiety will decrease Outcome: Progressing   Problem: Elimination: Goal: Will not experience complications related to bowel motility Outcome: Progressing Goal: Will not experience complications related to urinary retention Outcome: Progressing   Problem: Pain Managment: Goal: General experience of comfort will improve Outcome: Progressing   Problem: Safety: Goal: Ability to remain free from injury will improve Outcome: Progressing   Problem: Skin Integrity: Goal: Risk for impaired skin integrity will decrease Outcome: Progressing   Problem: Respiratory: Goal: Will maintain a patent airway Outcome: Progressing Goal: Complications related to the disease process, condition or treatment will be avoided or minimized Outcome: Progressing

## 2021-03-18 NOTE — Progress Notes (Signed)
[facility]  - [place] [facility]   PATIENT NAME: Erica Mueller    MR#:  [id number]  DATE OF BIRTH:  [birth year]  SUBJECTIVE:  more awake this morning. Nurse attempting to feed her breakfast. Tolerating tube feeding. REVIEW OF SYSTEMS:   Review of Systems  Unable to perform ROS: Psychiatric disorder  Tolerating Diet:PEG feeding Tolerating PT: snf  DRUG ALLERGIES:  No Known Allergies  VITALS:  Blood pressure (!) 133/48, pulse 89, temperature 98.1 F (36.7 C), temperature source Axillary, resp. rate 17, height 5\' 2"  (1.575 m), weight 46.5 kg, SpO2 97 %.  PHYSICAL EXAMINATION:   Physical Exam  GENERAL:  70 y.o.-year-old patient lying in the bed with no acute distress.  LUNGS: Normal breath sounds bilaterally, no wheezing, rales, rhonchi. No use of accessory muscles of respiration.  CARDIOVASCULAR: S1, S2 normal. No murmurs, rubs, or gallops.  ABDOMEN: Soft, nontender, nondistended. Bowel sounds present. No organomegaly or mass. PEG+ EXTREMITIES: No cyanosis, clubbing or edema b/l.    NEUROLOGIC: moves all extremities well spont PSYCHIATRIC:  patient is sleepy SKIN: No obvious rash, lesion, or ulcer.   LABORATORY PANEL:  CBC Recent Labs  Lab 03/16/21 0242  WBC 4.5  HGB 10.7*  HCT 33.0*  PLT 316     Chemistries  Recent Labs  Lab 03/16/21 0242  NA 136  K 4.4  CL 103  CO2 27  GLUCOSE 222*  BUN 29*  CREATININE 0.44  CALCIUM 9.3    Cardiac Enzymes No results for input(s): TROPONINI in the last 168 hours. RADIOLOGY:  No results found. ASSESSMENT AND PLAN:  Patient admitted 02/07/2021 with severe sepsis COVID-19 infection.  Past medical history of schizophrenia, hypertension, GERD, type 2 diabetes mellitus and depression and CAD.  The patient had a prolonged hospital course and never regained mental status.  She was treated for electrolyte abnormalities including hypernatremia, hypokalemia and hypophosphatemia.  She had an NG tube for 2 weeks and  then a PEG tube was placed on 02/24/2021.  There was a PEG tube malfunction 9/19. IR was able to change over the guidewire.  Seen by psychiatry and restarted on psychiatric medications but still catatonic.  Psychiatry started ECT treatment.  Schizophrenia with catatonia. --No communication initially now more interactive --s/p ETC on 9/28 and 9/30 --10/5  Recommended holding off on ECT at this time and will reevaluate on daily basis --Continue her current antipsychotic orders --Focus on her eating and PT --PT OT consulted--recommend rehab. Pt will d/c to [facility] once cleared by Psych - 10/7--ECT today --10/8--more awake and alert   Recent COVID infection. Severe sepsis secondary to aspiration pneumonia. Aspiration pneumonia. Improved on room air   Failure to thrive. Severe protein calorie malnutrition. Continue tube feeding per RD recommendation       DVT prophylaxis: Lovenox Code Status: full Family Communication: None at bedside Disposition Plan:      Status is: Inpatient   Getting ECT  Dispo: The patient is from: LTC              Anticipated d/c is to: TBD              Patient currently is not medically stable to d/c.              Difficult to place patient No      TOTAL TIME TAKING CARE OF THIS PATIENT: 20 minutes.  >50% time spent on counselling and coordination of care  Note: This dictation was prepared with Dragon dictation  along with smaller phrase technology. Any transcriptional errors that result from this process are unintentional.  Erica Mueller M.D    [facility]   CC: Primary care physician; [facility] Patient ID: Erica Mueller, female   DOB: [birth year], 70 y.o.   MRN: [id number]

## 2021-03-18 NOTE — Progress Notes (Signed)
Physical Therapy Treatment Patient Details Name: Erica Mueller MRN: [id number] DOB: Jan 12, 1951 Today's Date: 03/18/2021   History of Present Illness Pt is a 70 y/o F with PMH:  schizophrenia, HTN, GERD, type 2 diabetes mellitus,  depression and CAD. Pt presented on 02/07/21 d/t severe sepsis 2/2 COVID-19 infection. PEG tube placed 9/16 and tube changed 9/19. Pt has been catatonic. Phsychiatry started pt back on her baseline psych meds as well as ECT. Pt has become more interactive.    PT Comments    Pt seen for PT tx with pt received asleep but easily awakened for session. Pt unable to successfully verbally communicate during session & requires max multimodal cuing to initiate & follow simple commands throughout session. Pt requires max assist for bed mobility & max assist for sit<>stand with RW & with HHA, as well as stand pivot bed>recliner. Pt performs multiple sit<>stand throughout session, demonstrating posterior lean once in standing & inability to correct despite PT's attempts at cuing/facilitation. Pt would benefit from PT/OT co-tx to attempt gait & functional standing tasks.     Recommendations for follow up therapy are one component of a multi-disciplinary discharge planning process, led by the attending physician.  Recommendations may be updated based on patient status, additional functional criteria and insurance authorization.  Follow Up Recommendations  SNF;Supervision for mobility/OOB     Equipment Recommendations  None recommended by PT    Recommendations for Other Services       Precautions / Restrictions Precautions Precautions: Fall Precaution Comments: G-tube Restrictions Weight Bearing Restrictions: No     Mobility  Bed Mobility Overal bed mobility: Needs Assistance Bed Mobility: Supine to Sit     Supine to sit: Max assist     General bed mobility comments: bed rails, max cuing to bring BLE to EOB & upright trunk    Transfers Overall transfer level:  Needs assistance Equipment used: Rolling walker (2 wheeled);1 person hand held assist Transfers: Sit to/from [employer] Sit to Stand: Max assist Stand pivot transfers: Max assist       General transfer comment: PT attempts to provide assistance for hand placement but poor ability to follow commands and hold to RW. Pt requires max assist for all transfers 2/2 posterior lean.  Ambulation/Gait                 Stairs             Wheelchair Mobility    Modified Rankin (Stroke Patients Only)       Balance Overall balance assessment: Needs assistance Sitting-balance support: Feet supported;Bilateral upper extremity supported;Single extremity supported Sitting balance-Leahy Scale: Fair Sitting balance - Comments: close supervision<>min assist for static sitting; pt engaged in reaching forward to promote anterior lean & counteract posterior lean sitting EOB Postural control: Posterior lean Standing balance support: Bilateral upper extremity supported;Single extremity supported;During functional activity Standing balance-Leahy Scale: Zero Standing balance comment: max assist, posterior lean, PT attempts to block knees & facilitate upright posture                            Cognition Arousal/Alertness: Awake/alert Behavior During Therapy: Flat affect Overall Cognitive Status: No family/caregiver present to determine baseline cognitive functioning Area of Impairment: Orientation;Memory;Attention;Safety/judgement;Awareness;Problem solving;Following commands                 Orientation Level: Disoriented to;Place;Time;Situation;Person (answers to name but unable to verbalize name or age/birthday)   Memory: Decreased recall of  precautions;Decreased short-term memory Following Commands: Follows one step commands inconsistently;Follows one step commands with increased time (requires multimodal cuing) Safety/Judgement: Decreased awareness of  safety;Decreased awareness of deficits Awareness: Intellectual Problem Solving: Decreased initiation;Slow processing;Requires verbal cues;Requires tactile cues General Comments: Pt attempts to verbalize during session but incoherent words. Pt requires MAX multimodal cuing to follow commands. Pt fidgeting with anything she touches (washcloth, blanket, peg tube line) with PT redirecting PRN.      Exercises      General Comments General comments (skin integrity, edema, etc.): Pt incontinent of urine & BM & PT & nurse provide dependent assist for peri hygiene. Pt unable to follow multimodal cuing to perform BLE exercises.      Pertinent Vitals/Pain Pain Assessment: Faces Faces Pain Scale: No hurt    Home Living                      Prior Function            PT Goals (current goals can now be found in the care plan section) Acute Rehab PT Goals Patient Stated Goal: none stated PT Goal Formulation: Patient unable to participate in goal setting Progress towards PT goals: Progressing toward goals    Frequency    Min 2X/week      PT Plan Current plan remains appropriate    Co-evaluation              AM-PAC PT "6 Clicks" Mobility   Outcome Measure  Help needed turning from your back to your side while in a flat bed without using bedrails?: A Lot Help needed moving from lying on your back to sitting on the side of a flat bed without using bedrails?: A Lot Help needed moving to and from a bed to a chair (including a wheelchair)?: Total Help needed standing up from a chair using your arms (e.g., wheelchair or bedside chair)?: Total Help needed to walk in hospital room?: Total Help needed climbing 3-5 steps with a railing? : Total 6 Click Score: 8    End of Session   Activity Tolerance: Patient tolerated treatment well Patient left: in chair;with chair alarm set;with call bell/phone within reach Nurse Communication: Mobility status PT Visit Diagnosis:  Unsteadiness on feet (R26.81);Other abnormalities of gait and mobility (R26.89);Muscle weakness (generalized) (M62.81);Difficulty in walking, not elsewhere classified (R26.2)     Time: 8588-5027 PT Time Calculation (min) (ACUTE ONLY): 23 min  Charges:  $Therapeutic Activity: 23-37 mins                     Aleda Grana, PT, DPT 03/18/21, 1:49 PM    Sandi Mariscal 03/18/2021, 1:48 PM

## 2021-03-18 NOTE — Plan of Care (Signed)
  Problem: Education: Goal: Knowledge of General Education information will improve Description: Including pain rating scale, medication(s)/side effects and non-pharmacologic comfort measures Outcome: Progressing   Problem: Health Behavior/Discharge Planning: Goal: Ability to manage health-related needs will improve Outcome: Progressing   Problem: Clinical Measurements: Goal: Ability to maintain clinical measurements within normal limits will improve Outcome: Progressing Goal: Will remain free from infection Outcome: Progressing Goal: Diagnostic test results will improve Outcome: Progressing Goal: Respiratory complications will improve Outcome: Progressing Goal: Cardiovascular complication will be avoided Outcome: Progressing   Problem: Activity: Goal: Risk for activity intolerance will decrease Outcome: Progressing   Problem: Nutrition: Goal: Adequate nutrition will be maintained Outcome: Progressing   Problem: Coping: Goal: Level of anxiety will decrease Outcome: Progressing   Problem: Elimination: Goal: Will not experience complications related to bowel motility Outcome: Progressing Goal: Will not experience complications related to urinary retention Outcome: Progressing   Problem: Pain Managment: Goal: General experience of comfort will improve Outcome: Progressing   Problem: Safety: Goal: Ability to remain free from injury will improve Outcome: Progressing   Problem: Skin Integrity: Goal: Risk for impaired skin integrity will decrease Outcome: Progressing   Problem: Respiratory: Goal: Will maintain a patent airway Outcome: Progressing Goal: Complications related to the disease process, condition or treatment will be avoided or minimized Outcome: Progressing   

## 2021-03-19 NOTE — Consult Note (Signed)
[facility] Face-to-Face Psychiatry Consult   Reason for Consult: Follow-up consult patient with schizophrenia receiving ECT for depression and catatonia Referring Physician: Allena Katz Patient Identification: Erica Mueller MRN:  [id number] Principal Diagnosis: Catatonia Diagnosis:  Principal Problem:   Catatonia Active Problems:   Type 2 diabetes mellitus with hyperglycemia, without long-term current use of insulin (HCC)   Essential hypertension   Schizophrenia (HCC)   Sepsis (HCC)   Pressure injury of skin   Protein-calorie malnutrition, severe   Failure to thrive in adult   Hypernatremia   Hypokalemia   AKI (acute kidney injury) (HCC)   COVID-19 virus infection   PEG tube malfunction (HCC)   Hypophosphatemia   Total Time spent with patient: 30 minutes  Subjective:   Erica Mueller is a 70 y.o. female patient admitted with I am okay.  HPI: Patient seen.  Chart reviewed.  Spoke with nursing.  Patient is a little more cooperative with eating.  She made good eye contact today and responded to questions although still with only a couple of words.  Past Psychiatric History: History of chronic schizophrenia  Risk to Self:   Risk to Others:   Prior Inpatient Therapy:   Prior Outpatient Therapy:    Past Medical History:  Past Medical History:  Diagnosis Date   Coronary artery disease    Depression    Diabetes mellitus without complication (HCC)    Elevated lipids    GERD (gastroesophageal reflux disease)    Hypertension    Schizophrenia (HCC)    Syphilis (acquired)     Past Surgical History:  Procedure Laterality Date   COLONOSCOPY     COLONOSCOPY WITH PROPOFOL N/A 09/01/2015   Procedure: COLONOSCOPY WITH PROPOFOL;  Surgeon: Wallace Cullens, MD;  Location: [facility] ENDOSCOPY;  Service: Gastroenterology;  Laterality: N/A;   IR FLUORO RM 30-60 MIN  02/23/2021   IR GASTROSTOMY TUBE MOD SED  02/24/2021   IR REPLC GASTRO/COLONIC TUBE PERCUT W/FLUORO  02/27/2021   TUBAL LIGATION     wisdom teeth  removal     Family History:  Family History  Problem Relation Age of Onset   Cancer Mother    Alcoholism Mother    Bone cancer Father    Heart disease Father    Diabetes Maternal Grandmother    Suicidality Paternal Uncle    Breast cancer Neg Hx    Family Psychiatric  History: See previous.  Father suicide. Social History:  Social History   Substance and Sexual Activity  Alcohol Use Yes   Alcohol/week: 3.0 standard drinks   Types: 2 Cans of beer, 1 Shots of liquor per week     Social History   Substance and Sexual Activity  Drug Use Not Currently   Types: Marijuana   Comment: PAST     Social History   Socioeconomic History   Marital status: Divorced    Spouse name: Not on file   Number of children: Not on file   Years of education: Not on file   Highest education level: Not on file  Occupational History   Not on file  Tobacco Use   Smoking status: Every Day    Packs/day: 1.00    Years: 48.00    Pack years: 48.00    Types: Cigarettes    Start date: 12/20/1969   Smokeless tobacco: Never  Vaping Use   Vaping Use: Never used  Substance and Sexual Activity   Alcohol use: Yes    Alcohol/week: 3.0 standard drinks  Types: 2 Cans of beer, 1 Shots of liquor per week   Drug use: Not Currently    Types: Marijuana    Comment: PAST    Sexual activity: Not Currently    Birth control/protection: None  Other Topics Concern   Not on file  Social History Narrative   Not on file   Social Determinants of Health   Financial Resource Strain: Not on file  Food Insecurity: Not on file  Transportation Needs: Not on file  Physical Activity: Not on file  Stress: Not on file  Social Connections: Not on file   Additional Social History:    Allergies:  No Known Allergies  Labs: No results found for this or any previous visit (from the past 48 hour(s)).  Current Facility-Administered Medications  Medication Dose Route Frequency Provider Last Rate Last Admin    acetaminophen (TYLENOL) suppository 650 mg  650 mg Rectal Q4H PRN Jimmye Norman, NP       acetaminophen (TYLENOL) tablet 650 mg  650 mg Oral Q4H PRN Harlon Ditty D, NP       ascorbic acid (VITAMIN C) tablet 500 mg  500 mg Per Tube Daily Charise Killian, MD   500 mg at 03/19/21 0917   chlorhexidine (PERIDEX) 0.12 % solution 15 mL  15 mL Mouth Rinse BID Alberteen Sam, MD   15 mL at 03/19/21 0093   Chlorhexidine Gluconate Cloth 2 % PADS 6 each  6 each Topical Daily Erin Fulling, MD   6 each at 03/19/21 0918   cholecalciferol (VITAMIN D3) tablet 1,000 Units  1,000 Units Oral Daily [name], MD   1,000 Units at 03/19/21 0917   docusate sodium (COLACE) capsule 100 mg  100 mg Oral BID PRN Judithe Modest, NP       enoxaparin (LOVENOX) injection 40 mg  40 mg Subcutaneous Q24H Bari Mantis A, RPH   40 mg at 03/19/21 0524   feeding supplement (NEPRO CARB STEADY) liquid 237 mL  237 mL Oral BID BM Lynn Ito, MD   237 mL at 03/19/21 0918   feeding supplement (OSMOLITE 1.2 CAL) liquid 1,000 mL  1,000 mL Per Tube Continuous [name], MD 60 mL/hr at 03/18/21 1410 1,000 mL at 03/18/21 1410   free water 60 mL  60 mL Per Tube Q4H Lynn Ito, MD   60 mL at 03/19/21 1222   glimepiride (AMARYL) tablet 2 mg  2 mg Oral Daily [name], MD   2 mg at 03/19/21 0918   insulin aspart (novoLOG) injection 0-9 Units  0-9 Units Subcutaneous Q4H Alford Highland, MD   2 Units at 03/19/21 1222   MEDLINE mouth rinse  15 mL Mouth Rinse q12n4p Danford, Earl Lites, MD   15 mL at 03/19/21 1222   metoprolol tartrate (LOPRESSOR) 25 mg/10 mL oral suspension 12.5 mg  12.5 mg Per Tube BID Alford Highland, MD   12.5 mg at 03/19/21 0917   ondansetron (ZOFRAN) injection 4 mg  4 mg Intravenous Q6H PRN Harlon Ditty D, NP       polyethylene glycol (MIRALAX / GLYCOLAX) packet 17 g  17 g Oral Daily [name], MD   17 g at 03/19/21 0917   pravastatin (PRAVACHOL) tablet 40 mg  40 mg Oral q1800 [name], MD   40 mg at 03/18/21 1737   senna-docusate (Senokot-S) tablet 1 tablet  1 tablet Oral BID [name], MD   1 tablet at 03/19/21 0818   thiothixene (NAVANE) capsule  2 mg  2 mg Per Tube BID Elba Schaber, Jackquline Denmark, MD   2 mg at 03/19/21 0917    Musculoskeletal: Strength & Muscle Tone: decreased Gait & Station: unable to stand Patient leans: N/A            Psychiatric Specialty Exam:  Presentation  General Appearance:  No data recorded Eye Contact: No data recorded Speech: No data recorded Speech Volume: No data recorded Handedness: No data recorded  Mood and Affect  Mood: No data recorded Affect: No data recorded  Thought Process  Thought Processes: No data recorded Descriptions of Associations:No data recorded Orientation:No data recorded Thought Content:No data recorded History of Schizophrenia/Schizoaffective disorder:No data recorded Duration of Psychotic Symptoms:No data recorded Hallucinations:No data recorded Ideas of Reference:No data recorded Suicidal Thoughts:No data recorded Homicidal Thoughts:No data recorded  Sensorium  Memory: No data recorded Judgment: No data recorded Insight: No data recorded  Executive Functions  Concentration: No data recorded Attention Span: No data recorded Recall: No data recorded Fund of Knowledge: No data recorded Language: No data recorded  Psychomotor Activity  Psychomotor Activity: No data recorded  Assets  Assets: No data recorded  Sleep  Sleep: No data recorded  Physical Exam: Physical Exam Vitals and nursing note reviewed.  Constitutional:      Appearance: Normal appearance. She is ill-appearing.  HENT:     Head: Normocephalic and atraumatic.     Mouth/Throat:     Pharynx: Oropharynx is clear.  Eyes:     Pupils: Pupils are equal, round, and reactive to light.  Cardiovascular:     Rate and Rhythm: Normal rate and regular rhythm.  Pulmonary:     Effort: Pulmonary effort is  normal.     Breath sounds: Normal breath sounds.  Abdominal:     General: Abdomen is flat.     Palpations: Abdomen is soft.  Musculoskeletal:        General: Normal range of motion.  Skin:    General: Skin is warm and dry.  Neurological:     General: No focal deficit present.     Mental Status: She is alert. Mental status is at baseline.  Psychiatric:        Mood and Affect: Mood normal.        Speech: Speech is delayed.        Behavior: Behavior is withdrawn.        Cognition and Memory: Cognition is impaired.   Review of Systems  Constitutional: Negative.   HENT: Negative.    Eyes: Negative.   Respiratory: Negative.    Cardiovascular: Negative.   Gastrointestinal: Negative.   Musculoskeletal: Negative.   Skin: Negative.   Neurological: Negative.   Psychiatric/Behavioral: Negative.    Blood pressure (!) 130/51, pulse 79, temperature 98.3 F (36.8 C), resp. rate 16, height 5\' 2"  (1.575 m), weight 46.5 kg, SpO2 98 %. Body mass index is 18.75 kg/m.  Treatment Plan Summary: Plan looks a little better today than yesterday.  I think it is worth continuing the ECT treatment.  She is on the schedule for tomorrow.  Urged patient to eat and take her medicine and work with the physical therapist  Disposition: Patient does not meet criteria for psychiatric inpatient admission. Supportive therapy provided about ongoing stressors. ect  , MD 03/19/2021 1:28 PM

## 2021-03-19 NOTE — Progress Notes (Signed)
[facility]  - [place] at [facility]   PATIENT NAME: Erica Mueller    MR#:  [id number]  DATE OF BIRTH:  1950/11/10  SUBJECTIVE:  more awake this morning. Not much conversive REVIEW OF SYSTEMS:   Review of Systems  Unable to perform ROS: Psychiatric disorder  Tolerating Diet:PEG feeding Tolerating PT: snf  DRUG ALLERGIES:  No Known Allergies  VITALS:  Blood pressure (!) 130/51, pulse 79, temperature 98.3 F (36.8 C), resp. rate 16, height 5\' 2"  (1.575 m), weight 46.5 kg, SpO2 98 %.  PHYSICAL EXAMINATION:   Physical Exam  GENERAL:  70 y.o.-year-old patient lying in the bed with no acute distress.  LUNGS: Normal breath sounds bilaterally, no wheezing, rales, rhonchi. No use of accessory muscles of respiration.  CARDIOVASCULAR: S1, S2 normal. No murmurs, rubs, or gallops.  ABDOMEN: Soft, nontender, nondistended. Bowel sounds present. No organomegaly or mass. PEG+ EXTREMITIES: No cyanosis, clubbing or edema b/l.    NEUROLOGIC: moves all extremities well spont PSYCHIATRIC:  patient is sleepy SKIN: No obvious rash, lesion, or ulcer.   LABORATORY PANEL:  CBC Recent Labs  Lab 03/16/21 0242  WBC 4.5  HGB 10.7*  HCT 33.0*  PLT 316     Chemistries  Recent Labs  Lab 03/16/21 0242  NA 136  K 4.4  CL 103  CO2 27  GLUCOSE 222*  BUN 29*  CREATININE 0.44  CALCIUM 9.3    Cardiac Enzymes No results for input(s): TROPONINI in the last 168 hours. RADIOLOGY:  No results found. ASSESSMENT AND PLAN:  Patient admitted 02/07/2021 with severe sepsis COVID-19 infection.  Past medical history of schizophrenia, hypertension, GERD, type 2 diabetes mellitus and depression and CAD.  The patient had a prolonged hospital course and never regained mental status.  She was treated for electrolyte abnormalities including hypernatremia, hypokalemia and hypophosphatemia.  She had an NG tube for 2 weeks and then a PEG tube was placed on 02/24/2021.  There was a PEG tube  malfunction 9/19. IR was able to change over the guidewire.  Seen by psychiatry and restarted on psychiatric medications but still catatonic.  Psychiatry started ECT treatment.  Schizophrenia with catatonia. --No communication initially now more interactive --s/p ETC on 9/28 and 9/30 --10/5  Recommended holding off on ECT at this time and will reevaluate on daily basis --Continue her current antipsychotic orders --Focus on her eating and PT --PT OT consulted--recommend rehab. Pt will d/c to [facility] once cleared by Psych - 10/7--ECT today --10/8--more awake and alert --10/9-- plans for ECT on Monday NPO after midnight   Recent COVID infection. Severe sepsis secondary to aspiration pneumonia. Aspiration pneumonia. Improved on room air   Failure to thrive. Severe protein calorie malnutrition. Continue tube feeding per RD recommendation       DVT prophylaxis: Lovenox Code Status: full Family Communication: None at bedside Disposition Plan:      Status is: Inpatient   Getting ECT  Dispo: The patient is from: LTC              Anticipated d/c is to: TBD              Patient currently is not medically stable to d/c.              Difficult to place patient No      TOTAL TIME TAKING CARE OF THIS PATIENT: 20 minutes.  >50% time spent on counselling and coordination of care  Note: This dictation was prepared with Dragon dictation  along with smaller phrase technology. Any transcriptional errors that result from this process are unintentional.  [name] M.D    [facility]   CC: Primary care physician; [facility] Patient ID: Erica Mueller, female   DOB: [birth year], 70 y.o.   MRN: [id number]

## 2021-03-20 ENCOUNTER — Inpatient Hospital Stay (HOSPITAL_COMMUNITY)
Admit: 2021-03-20 | Discharge: 2021-03-20 | Disposition: A | Discharge status: Home / Self Care | Payer: Medicare Other | Attending: Interventional Radiology | Admitting: Interventional Radiology

## 2021-03-20 ENCOUNTER — Inpatient Hospital Stay: Payer: Medicare Other | Admitting: Certified Registered Nurse Anesthetist

## 2021-03-20 ENCOUNTER — Other Ambulatory Visit: Payer: Self-pay | Admitting: Psychiatry

## 2021-03-20 DIAGNOSIS — F332 Major depressive disorder, recurrent severe without psychotic features: Secondary | ICD-10-CM

## 2021-03-20 DIAGNOSIS — F061 Catatonic disorder due to known physiological condition: Secondary | ICD-10-CM | POA: Diagnosis not present

## 2021-03-20 LAB — GLUCOSE, CAPILLARY
Glucose-Capillary: 109 mg/dL — ABNORMAL HIGH (ref 70–99)
Glucose-Capillary: 115 mg/dL — ABNORMAL HIGH (ref 70–99)
Glucose-Capillary: 129 mg/dL — ABNORMAL HIGH (ref 70–99)
Glucose-Capillary: 129 mg/dL — ABNORMAL HIGH (ref 70–99)
Glucose-Capillary: 132 mg/dL — ABNORMAL HIGH (ref 70–99)
Glucose-Capillary: 153 mg/dL — ABNORMAL HIGH (ref 70–99)
Glucose-Capillary: 170 mg/dL — ABNORMAL HIGH (ref 70–99)
Glucose-Capillary: 180 mg/dL — ABNORMAL HIGH (ref 70–99)
Glucose-Capillary: 181 mg/dL — ABNORMAL HIGH (ref 70–99)
Glucose-Capillary: 183 mg/dL — ABNORMAL HIGH (ref 70–99)
Glucose-Capillary: 186 mg/dL — ABNORMAL HIGH (ref 70–99)
Glucose-Capillary: 200 mg/dL — ABNORMAL HIGH (ref 70–99)
Glucose-Capillary: 217 mg/dL — ABNORMAL HIGH (ref 70–99)
Glucose-Capillary: 261 mg/dL — ABNORMAL HIGH (ref 70–99)
Glucose-Capillary: 261 mg/dL — ABNORMAL HIGH (ref 70–99)
Glucose-Capillary: 266 mg/dL — ABNORMAL HIGH (ref 70–99)
Glucose-Capillary: 302 mg/dL — ABNORMAL HIGH (ref 70–99)
Glucose-Capillary: 353 mg/dL — ABNORMAL HIGH (ref 70–99)
Glucose-Capillary: 62 mg/dL — ABNORMAL LOW (ref 70–99)
Glucose-Capillary: 69 mg/dL — ABNORMAL LOW (ref 70–99)
Glucose-Capillary: 136 mg/dL — ABNORMAL HIGH (ref 70–99)
Glucose-Capillary: 212 mg/dL — ABNORMAL HIGH (ref 70–99)
Glucose-Capillary: 259 mg/dL — ABNORMAL HIGH (ref 70–99)

## 2021-03-20 MED ORDER — SODIUM CHLORIDE 0.9 % IV SOLN: INTRAVENOUS | Status: DC | PRN

## 2021-03-20 MED ORDER — GLYCOPYRROLATE 0.2 MG/ML IJ SOLN: 0.2000 mg | Freq: Once | INTRAMUSCULAR | Status: DC

## 2021-03-20 MED ORDER — KETOROLAC TROMETHAMINE 30 MG/ML IJ SOLN: 30.0000 mg | Freq: Once | INTRAMUSCULAR | Status: DC

## 2021-03-20 MED ORDER — SODIUM CHLORIDE 0.9 % IV SOLN: 500.0000 mL | Freq: Once | INTRAVENOUS | Status: DC

## 2021-03-20 MED ORDER — SUCCINYLCHOLINE CHLORIDE 200 MG/10ML IV SOSY
PREFILLED_SYRINGE | INTRAVENOUS | Status: DC | PRN
  Administered 2021-03-20: 70 mg via INTRAVENOUS

## 2021-03-20 MED ORDER — GLYCOPYRROLATE 0.2 MG/ML IJ SOLN: 0.1000 mg | Freq: Once | INTRAMUSCULAR | Status: DC

## 2021-03-20 MED ORDER — METHOHEXITAL SODIUM 100 MG/10ML IV SOSY
PREFILLED_SYRINGE | INTRAVENOUS | Status: DC | PRN
  Administered 2021-03-20: 50 mg via INTRAVENOUS

## 2021-03-20 NOTE — Anesthesia Preprocedure Evaluation (Signed)
Anesthesia Evaluation  Patient identified by MRN, date of birth, ID band Patient awake    Reviewed: Allergy & Precautions, NPO status , Patient's Chart, lab work & pertinent test results  History of Anesthesia Complications Negative for: history of anesthetic complications  Airway Mallampati: II  TM Distance: >3 FB Neck ROM: Full    Dental no notable dental hx. (+) Teeth Intact   Pulmonary neg pulmonary ROS, Current Smoker and Patient abstained from smoking.,    Pulmonary exam normal breath sounds clear to auscultation       Cardiovascular Exercise Tolerance: Good METS: 3 - Mets hypertension, + CAD (Stable- tolerated ECT well)  Normal cardiovascular exam Rhythm:Regular Rate:Normal     Neuro/Psych PSYCHIATRIC DISORDERS Depression Schizophrenia negative neurological ROS     GI/Hepatic Neg liver ROS, GERD  Medicated and Controlled,  Endo/Other  negative endocrine ROSdiabetes, Well Controlled, Type 2  Renal/GU negative Renal ROS  negative genitourinary   Musculoskeletal negative musculoskeletal ROS (+)   Abdominal   Peds  Hematology negative hematology ROS (+)   Anesthesia Other Findings   Reproductive/Obstetrics negative OB ROS                             Anesthesia Physical Anesthesia Plan  ASA: 3  Anesthesia Plan: General   Post-op Pain Management:    Induction: Intravenous  PONV Risk Score and Plan: Ondansetron  Airway Management Planned: Mask  Additional Equipment:   Intra-op Plan:   Post-operative Plan: Extubation in OR  Informed Consent: I have reviewed the patients History and Physical, chart, labs and discussed the procedure including the risks, benefits and alternatives for the proposed anesthesia with the patient or authorized representative who has indicated his/her understanding and acceptance.     Dental advisory given  Plan Discussed with:  CRNA  Anesthesia Plan Comments: (Benefits and risk discussed with patient to include death, MI and CVA.  Pt wishes to proceed.)        Anesthesia Quick Evaluation

## 2021-03-20 NOTE — Anesthesia Procedure Notes (Signed)
Procedure Name: General with mask airway Date/Time: 03/20/2021 1:03 PM Performed by: Joanette Gula, Desirie Minteer, CRNA Pre-anesthesia Checklist: Patient identified, Emergency Drugs available, Suction available, Timeout performed and Patient being monitored Patient Re-evaluated:Patient Re-evaluated prior to induction Oxygen Delivery Method: Circle system utilized Preoxygenation: Pre-oxygenation with 100% oxygen Induction Type: IV induction Ventilation: Mask ventilation throughout procedure

## 2021-03-20 NOTE — Progress Notes (Addendum)
[facility]  - [facility]   PATIENT NAME: Erica Mueller    MR#:  [id number]  DATE OF BIRTH:  December 30, 1950  SUBJECTIVE:  more awake this morning Repeated Good morning after me NPO today REVIEW OF SYSTEMS:   Review of Systems  Unable to perform ROS: Psychiatric disorder  Tolerating Diet:PEG feeding Tolerating PT: snf  DRUG ALLERGIES:  No Known Allergies  VITALS:  Blood pressure 136/61, pulse 92, temperature 97.7 F (36.5 C), temperature source Oral, resp. rate 14, height 5\' 2"  (1.575 m), weight 46.8 kg, SpO2 100 %.  PHYSICAL EXAMINATION:   Physical Exam  GENERAL:  70 y.o.-year-old patient lying in the bed with no acute distress.  LUNGS: Normal breath sounds bilaterally, no wheezing, rales, rhonchi. No use of accessory muscles of respiration.  CARDIOVASCULAR: S1, S2 normal. No murmurs, rubs, or gallops.  ABDOMEN: Soft, nontender, nondistended. Bowel sounds present. No organomegaly or mass. PEG+ EXTREMITIES: No cyanosis, clubbing or edema b/l.    NEUROLOGIC: moves all extremities well spont PSYCHIATRIC:  patient is alert   LABORATORY PANEL:  CBC Recent Labs  Lab 03/16/21 0242  WBC 4.5  HGB 10.7*  HCT 33.0*  PLT 316     Chemistries  Recent Labs  Lab 03/16/21 0242  NA 136  K 4.4  CL 103  CO2 27  GLUCOSE 222*  BUN 29*  CREATININE 0.44  CALCIUM 9.3    Cardiac Enzymes No results for input(s): TROPONINI in the last 168 hours. RADIOLOGY:  No results found. ASSESSMENT AND PLAN:  Patient admitted 02/07/2021 with severe sepsis COVID-19 infection.  Past medical history of schizophrenia, hypertension, GERD, type 2 diabetes mellitus and depression and CAD.  The patient had a prolonged hospital course and never regained mental status.  She was treated for electrolyte abnormalities including hypernatremia, hypokalemia and hypophosphatemia.  She had an NG tube for 2 weeks and then a PEG tube was placed on 02/24/2021.  There was a PEG tube  malfunction 9/19. IR was able to change over the guidewire.  Seen by psychiatry and restarted on psychiatric medications but still catatonic.  Psychiatry started ECT treatment.  Schizophrenia with catatonia. --No communication initially now more interactive --s/p ETC on 9/28 and 9/30 --10/5  Recommended holding off on ECT at this time and will reevaluate on daily basis --Continue her current antipsychotic orders --Focus on her eating and PT --PT OT consulted--recommend rehab. Pt will d/c to [facility] once cleared by Psych - 10/7--ECT today --10/8--more awake and alert --10/9-- plans for ECT on Monday NPO after midnight --10/10--ECT today   Recent COVID infection. Severe sepsis secondary to aspiration pneumonia. Aspiration pneumonia. Improved on room air   Failure to thrive. Severe protein calorie malnutrition. Continue tube feeding per RD recommendation       DVT prophylaxis: Lovenox Code Status: full Family Communication: None at bedside Disposition Plan:      Status is: Inpatient   Getting ECT  Dispo: The patient is from: LTC              Anticipated d/c is to: TBD              Patient currently is not medically stable to d/c.              Difficult to place patient No      TOTAL TIME TAKING CARE OF THIS PATIENT: 20 minutes.  >50% time spent on counselling and coordination of care  Note: This dictation was prepared with Thursday  dictation along with smaller phrase technology. Any transcriptional errors that result from this process are unintentional.  [name] M.D    [facility]   CC: Primary care physician; [facility] Patient ID: WYOLENE WEIMANN, female   DOB: [birth year], 70 y.o.   MRN: [id number]

## 2021-03-20 NOTE — Transfer of Care (Signed)
Immediate Anesthesia Transfer of Care Note  Patient: Erica Mueller  Procedure(s) Performed: ECT TX  Patient Location: PACU  Anesthesia Type:General  Level of Consciousness: drowsy  Airway & Oxygen Therapy: Patient Spontanous Breathing and Patient connected to face mask oxygen  Post-op Assessment: Report given to RN and Post -op Vital signs reviewed and stable  Post vital signs: Reviewed and stable  Last Vitals:  Vitals Value Taken Time  BP 150/75 03/20/21 1322  Temp 36.6 C 03/20/21 1322  Pulse 101 03/20/21 1322  Resp 16 03/20/21 1322  SpO2 100 % 03/20/21 1322    Last Pain:  Vitals:   03/20/21 1322  TempSrc: Temporal  PainSc:          Complications: No notable events documented.

## 2021-03-20 NOTE — Anesthesia Postprocedure Evaluation (Signed)
Anesthesia Post Note  Patient: Erica Mueller  Procedure(s) Performed: ECT TX  Patient location during evaluation: PACU Anesthesia Type: General Level of consciousness: awake and alert Pain management: pain level controlled Vital Signs Assessment: post-procedure vital signs reviewed and stable Respiratory status: spontaneous breathing, nonlabored ventilation, respiratory function stable and patient connected to nasal cannula oxygen Cardiovascular status: blood pressure returned to baseline and stable Postop Assessment: no apparent nausea or vomiting Anesthetic complications: no   No notable events documented.   Last Vitals:  Vitals:   03/20/21 1342 03/20/21 1352  BP: (!) 168/72 (!) 164/72  Pulse: 98 96  Resp: 17 16  Temp:  36.9 C  SpO2: 97% 98%    Last Pain:  Vitals:   03/20/21 1352  TempSrc: Tympanic  PainSc:                  [name]

## 2021-03-20 NOTE — Progress Notes (Addendum)
PT Cancellation Note  Patient Details Name: Erica Mueller MRN: [id number] DOB: 1950/08/15   Cancelled Treatment:    Reason Eval/Treat Not Completed: Patient at procedure or test/unavailable Pt leaving for ECT tx. PT to reassess as able.   [employer], SPT

## 2021-03-20 NOTE — Procedures (Signed)
ECT SERVICES Physician's Interval Evaluation & Treatment Note  Patient Identification: Erica Mueller MRN:  [id number] Date of Evaluation:  03/20/2021 TX #: [id number]  MADRS:   MMSE:   P.E. Findings:  Patient continues to be cachectic withdrawn but no new findings  Psychiatric Interval Note:  A little more interactive able to answer a couple more questions smiling more  Subjective:  Patient is a 70 y.o. female seen for evaluation for Electroconvulsive Therapy. No specific complaint  Treatment Summary:   []   Right Unilateral             [x]  Bilateral   % Energy : 1.0 ms 60%   Impedance: 2670 ohms  Seizure Energy Index: 3300 V squared  Postictal Suppression Index: 51%  Seizure Concordance Index: 67%  Medications  Pre Shock: Toradol 30 mg Robinul 0.2 mg Brevital 50 mg succinylcholine 70 mg  Post Shock:    Seizure Duration: 28 seconds EMG 32 seconds EEG   Comments: Follow-up Wednesday  Lungs:  [x]   Clear to auscultation               []  Other:   Heart:    [x]   Regular rhythm             []  irregular rhythm    [x]   Previous H&P reviewed, patient examined and there are NO CHANGES                 []   Previous H&P reviewed, patient examined and there are changes noted.   2671, MD 10/10/20225:29 PM

## 2021-03-20 NOTE — Progress Notes (Addendum)
Physical Therapy Treatment Patient Details Name: Erica Mueller MRN: [id number] DOB: 16-Aug-1950 Today's Date: 03/20/2021   History of Present Illness Pt is a 70 y/o F with PMH:  schizophrenia, HTN, GERD, type 2 diabetes mellitus,  depression and CAD. Pt presented on 02/07/21 d/t severe sepsis 2/2 COVID-19 infection. PEG tube placed 9/16 and tube changed 9/19. Pt has been catatonic. Phsychiatry started pt back on her baseline psych meds as well as ECT. Pt has become more interactive.    PT Comments    Pt in bed, awakes to tactile and verbal cues responding with "hello." No other verbal communication throughout session. Pt required increased assistance this session compared to previous ones with inability to sit statically w/out MIN-MOD A due to posterior lean and MAX to TOTAL A for bed mobility. OOB mobility deferred 2/2 to inconsistent ability to follow commands and fatigue and pt receiving ECT in AM. Discharge recommendations remain SNF. Skilled PT intervention is indicated to address deficits in function, mobility, and to return to PLOF as able.     Recommendations for follow up therapy are one component of a multi-disciplinary discharge planning process, led by the attending physician.  Recommendations may be updated based on patient status, additional functional criteria and insurance authorization.  Follow Up Recommendations  SNF;Supervision for mobility/OOB     Equipment Recommendations  None recommended by PT    Recommendations for Other Services       Precautions / Restrictions Precautions Precautions: Fall Precaution Comments: G-tube Restrictions Weight Bearing Restrictions: No     Mobility  Bed Mobility Overal bed mobility: Needs Assistance Bed Mobility: Supine to Sit;Sit to Supine     Supine to sit: Max assist Sit to supine: Total assist   General bed mobility comments: Pt able to move BLE end of bed but quickly pulls them back  to bed;  BLE & trunk w/ pt reaching L  arm supine > sit.    Transfers                 General transfer comment: Not attempted due to poor static sitting balance, pt unable to follow commands consistently  Ambulation/Gait                 Stairs             Wheelchair Mobility    Modified Rankin (Stroke Patients Only)       Balance Overall balance assessment: Needs assistance Sitting-balance support: Feet supported;Bilateral upper extremity supported Sitting balance-Leahy Scale: Poor Sitting balance - Comments: Required MIN - MOD A for static sitting, without physical assist posterior trunk lean       Standing balance comment: Not attempted this session                            Cognition Arousal/Alertness: Lethargic Behavior During Therapy: Flat affect Overall Cognitive Status: No family/caregiver present to determine baseline cognitive functioning Area of Impairment: Orientation;Memory;Attention;Safety/judgement;Awareness;Problem solving;Following commands                 Orientation Level: Disoriented to;Place;Time;Situation;Person     Following Commands: Follows one step commands with increased time;Follows one step commands inconsistently Safety/Judgement: Decreased awareness of safety;Decreased awareness of deficits   Problem Solving: Difficulty sequencing;Decreased initiation;Requires verbal cues;Requires tactile cues General Comments: multimodal cues for awakening, once awake pt stated "hello" and responds to voice, but lacks overall ability to follow commands this session.  Exercises      General Comments        Pertinent Vitals/Pain Pain Assessment: Faces Faces Pain Scale: No hurt Pain Intervention(s): Monitored during session    Home Living                      Prior Function            PT Goals (current goals can now be found in the care plan section) Progress towards PT goals: Progressing toward goals    Frequency    Min  2X/week      PT Plan Current plan remains appropriate    Co-evaluation              AM-PAC PT "6 Clicks" Mobility   Outcome Measure  Help needed turning from your back to your side while in a flat bed without using bedrails?: A Lot Help needed moving from lying on your back to sitting on the side of a flat bed without using bedrails?: A Lot Help needed moving to and from a bed to a chair (including a wheelchair)?: Total Help needed standing up from a chair using your arms (e.g., wheelchair or bedside chair)?: Total Help needed to walk in hospital room?: Total Help needed climbing 3-5 steps with a railing? : Total 6 Click Score: 8    End of Session   Activity Tolerance: Patient limited by lethargy Patient left: in bed;with call bell/phone within reach;with bed alarm set;with nursing/sitter in room Nurse Communication: Mobility status PT Visit Diagnosis: Unsteadiness on feet (R26.81);Other abnormalities of gait and mobility (R26.89);Muscle weakness (generalized) (M62.81);Difficulty in walking, not elsewhere classified (R26.2)     Time: 7371-0626 PT Time Calculation (min) (ACUTE ONLY): 24 min  Charges:                       [employer], SPT

## 2021-03-20 NOTE — Progress Notes (Signed)
OT Cancellation Note  Patient Details Name: Erica Mueller MRN: [id number] DOB: 1951/03/08   Cancelled Treatment:    Reason Eval/Treat Not Completed: Patient at procedure or test/ unavailable. Pt is out of the room. Will re-attempt at later date/time as pt is available for OT tx.   Arman Filter., MPH, MS, OTR/L ascom [phone] 03/20/21, 1:53 PM

## 2021-03-20 NOTE — H&P (Signed)
Erica Mueller is an 70 y.o. female.   Chief Complaint: Patient offers no specific chief complaint HPI: 70 year old woman with a history of schizophrenia currently receiving ECT for catatonia.  Patient appears to be functioning better more alert and interactive than she was on Friday.  Still however severely impaired unable to care for herself.  Very limited speech in conversation.  Past Medical History:  Diagnosis Date   Coronary artery disease    Depression    Diabetes mellitus without complication (HCC)    Elevated lipids    GERD (gastroesophageal reflux disease)    Hypertension    Schizophrenia (HCC)    Syphilis (acquired)     Past Surgical History:  Procedure Laterality Date   COLONOSCOPY     COLONOSCOPY WITH PROPOFOL N/A 09/01/2015   Procedure: COLONOSCOPY WITH PROPOFOL;  Surgeon: Wallace Cullens, MD;  Location: [facility] ENDOSCOPY;  Service: Gastroenterology;  Laterality: N/A;   IR FLUORO RM 30-60 MIN  02/23/2021   IR GASTROSTOMY TUBE MOD SED  02/24/2021   IR REPLC GASTRO/COLONIC TUBE PERCUT W/FLUORO  02/27/2021   TUBAL LIGATION     wisdom teeth removal      Family History  Problem Relation Age of Onset   Cancer Mother    Alcoholism Mother    Bone cancer Father    Heart disease Father    Diabetes Maternal Grandmother    Suicidality Paternal Uncle    Breast cancer Neg Hx    Social History:  reports that she has been smoking cigarettes. She started smoking about 51 years ago. She has a 48.00 pack-year smoking history. She has never used smokeless tobacco. She reports current alcohol use of about 3.0 standard drinks per week. She reports that she does not currently use drugs after having used the following drugs: Marijuana.  Allergies: No Known Allergies  (Not in a hospital admission)   Results for orders placed or performed during the hospital encounter of 02/07/21 (from the past 48 hour(s))  Glucose, capillary     Status: Abnormal   Collection Time: 03/18/21  7:38 PM  Result  Value Ref Range   Glucose-Capillary 183 (H) 70 - 99 mg/dL    Comment: Glucose reference range applies only to samples taken after fasting for at least 8 hours.   Comment 1 Notify RN    Comment 2 Document in Chart   Glucose, capillary     Status: Abnormal   Collection Time: 03/18/21 11:33 PM  Result Value Ref Range   Glucose-Capillary 200 (H) 70 - 99 mg/dL    Comment: Glucose reference range applies only to samples taken after fasting for at least 8 hours.  Glucose, capillary     Status: Abnormal   Collection Time: 03/19/21  4:29 AM  Result Value Ref Range   Glucose-Capillary 261 (H) 70 - 99 mg/dL    Comment: Glucose reference range applies only to samples taken after fasting for at least 8 hours.  Glucose, capillary     Status: Abnormal   Collection Time: 03/19/21  7:50 AM  Result Value Ref Range   Glucose-Capillary 180 (H) 70 - 99 mg/dL    Comment: Glucose reference range applies only to samples taken after fasting for at least 8 hours.  Glucose, capillary     Status: Abnormal   Collection Time: 03/19/21 11:42 AM  Result Value Ref Range   Glucose-Capillary 186 (H) 70 - 99 mg/dL    Comment: Glucose reference range applies only to samples taken after fasting  for at least 8 hours.  Glucose, capillary     Status: Abnormal   Collection Time: 03/19/21  4:43 PM  Result Value Ref Range   Glucose-Capillary 261 (H) 70 - 99 mg/dL    Comment: Glucose reference range applies only to samples taken after fasting for at least 8 hours.  Glucose, capillary     Status: Abnormal   Collection Time: 03/19/21  7:45 PM  Result Value Ref Range   Glucose-Capillary 132 (H) 70 - 99 mg/dL    Comment: Glucose reference range applies only to samples taken after fasting for at least 8 hours.  Glucose, capillary     Status: Abnormal   Collection Time: 03/19/21 11:26 PM  Result Value Ref Range   Glucose-Capillary 217 (H) 70 - 99 mg/dL    Comment: Glucose reference range applies only to samples taken after  fasting for at least 8 hours.  Glucose, capillary     Status: Abnormal   Collection Time: 03/20/21  4:35 AM  Result Value Ref Range   Glucose-Capillary 129 (H) 70 - 99 mg/dL    Comment: Glucose reference range applies only to samples taken after fasting for at least 8 hours.   Comment 1 Notify RN    Comment 2 Document in Chart   Glucose, capillary     Status: Abnormal   Collection Time: 03/20/21  7:40 AM  Result Value Ref Range   Glucose-Capillary 115 (H) 70 - 99 mg/dL    Comment: Glucose reference range applies only to samples taken after fasting for at least 8 hours.  Glucose, capillary     Status: Abnormal   Collection Time: 03/20/21 10:34 AM  Result Value Ref Range   Glucose-Capillary 129 (H) 70 - 99 mg/dL    Comment: Glucose reference range applies only to samples taken after fasting for at least 8 hours.  Glucose, capillary     Status: Abnormal   Collection Time: 03/20/21  2:09 PM  Result Value Ref Range   Glucose-Capillary 136 (H) 70 - 99 mg/dL    Comment: Glucose reference range applies only to samples taken after fasting for at least 8 hours.  Glucose, capillary     Status: Abnormal   Collection Time: 03/20/21  4:30 PM  Result Value Ref Range   Glucose-Capillary 212 (H) 70 - 99 mg/dL    Comment: Glucose reference range applies only to samples taken after fasting for at least 8 hours.   No results found.  Review of Systems  Unable to perform ROS: Mental status change  Skin: Negative.    Blood pressure (!) 164/72, pulse 96, temperature 98.4 F (36.9 C), temperature source Tympanic, resp. rate 16, height 5\' 2"  (1.575 m), weight 46.8 kg, SpO2 98 %. Physical Exam Vitals and nursing note reviewed.  Constitutional:      Appearance: She is well-developed. She is ill-appearing.  HENT:     Head: Normocephalic and atraumatic.  Eyes:     Conjunctiva/sclera: Conjunctivae normal.     Pupils: Pupils are equal, round, and reactive to light.  Cardiovascular:     Heart sounds:  Normal heart sounds.  Pulmonary:     Effort: Pulmonary effort is normal.  Abdominal:     Palpations: Abdomen is soft.  Musculoskeletal:        General: Normal range of motion.     Cervical back: Normal range of motion.  Skin:    General: Skin is warm and dry.  Neurological:     Mental Status: She  is alert.  Psychiatric:        Attention and Perception: She is inattentive.        Mood and Affect: Affect is blunt.        Speech: Speech is delayed.        Behavior: Behavior is slowed.        Thought Content: Thought content does not include suicidal ideation.        Cognition and Memory: Cognition is impaired.     Assessment/Plan Recommend continuing ECT at least for a few more treatments since I think were starting to see a pattern of consistent improvement.  Not sure still what the baseline will be.  Mordecai Rasmussen, MD 03/20/2021, 5:15 PM

## 2021-03-20 NOTE — TOC Progression Note (Signed)
Transition of Care [facility]) - Progression Note    Patient Details  Name: Erica Mueller MRN: [id number] Date of Birth: 07/21/1950  [facility] of [facility]) CM/SW Contact  Marlowe Sax, RN Phone Number: 03/20/2021, 10:15 AM  Clinical Narrative:    The patient continues medical treatment, will DC to [facility] when medically stable   Expected Discharge Plan: Long Term Nursing Home Barriers to Discharge: Continued Medical Work up  Expected Discharge Plan and Services Expected Discharge Plan: Long Term Nursing Home In-house Referral: Clinical Social Work   Post Acute Care Choice: Nursing Home Living arrangements for the past 2 months:  Air cabin crew Health Care / Long Term Care Facility)                                       Social Determinants of Health (SDOH) Interventions    Readmission Risk Interventions No flowsheet data found.

## 2021-03-20 NOTE — Progress Notes (Signed)
Nutrition Follow-up  DOCUMENTATION CODES:  Severe malnutrition in context of social or environmental circumstances  INTERVENTION:  Continue diet as ordered per SLP, advance as tolerated Nursing staff to assist with meal set-up and feeding Nursing to record meal intake percentages to assess oral intake. Continue enteral feeds via PEG Osmolite 1.2 @ 27m/h (1.44L/d)  67mfree water q4h Regimen will provide 1728 kcal, 80g of protein, and 154130mf free water (TF+flush) Weight pt weekly  NUTRITION DIAGNOSIS:  Severe Malnutrition related to social / environmental circumstances as evidenced by severe fat depletion, severe muscle depletion. -ongoing  GOAL:  Patient will meet greater than or equal to 90% of their needs - met with TF  MONITOR:  Labs, Weight trends, TF tolerance, Skin, I & O's  REASON FOR ASSESSMENT:  Malnutrition Screening Tool    ASSESSMENT:  [age]. female with medical history of HTN, DM, schizophrenia, depression, CAD, GERD, recent COVID 19 1d HLD who is admitted with aspiration PNA and sepsis.  9/16 PEG placed 9/20 g-tube exchanged due to malfunction 9/21 ECT initiated  9/26 diet advanced per SLP  Pt out of room at the time of assessment for ECT. Calorie count completed last week and intake consistently inadequate to meet needs. TF continues to run at goal (currently held since midnight due to ECT). Will continue current nutrition plan. Pt will return to facility once medically stable.   Average Meal Intake: 9/26-10/3: 63% intake x 2 recorded meals 10/4-10/10: 75% intake x 5 recorded meals  Nutritionally Relevant Medications: Scheduled Meds:  vitamin C  500 mg Per Tube Daily   cholecalciferol  1,000 Units Oral Daily   feeding supplement (NEPRO CARB STEADY)  237 mL Oral BID BM   free water  60 mL Per Tube Q4H   glimepiride  2 mg Oral Daily   insulin aspart  0-9 Units Subcutaneous Q4H   polyethylene glycol  17 g Oral Daily   pravastatin  40 mg Oral  q1800   senna-docusate  1 tablet Oral BID   Continuous Infusions:  feeding supplement (OSMOLITE 1.2 CAL) Stopped (03/19/21 2330)   PRN Meds: docusate sodium, ondansetron  Labs Reviewed: HgbA1c: 7/9% (8/30)  NUTRITION - FOCUSED PHYSICAL EXAM: Flowsheet Row Most Recent Value  Orbital Region Moderate depletion  Upper Arm Region Severe depletion  Thoracic and Lumbar Region Severe depletion  Buccal Region Moderate depletion  Temple Region Moderate depletion  Clavicle Bone Region Moderate depletion  Clavicle and Acromion Bone Region Severe depletion  Scapular Bone Region Severe depletion  Dorsal Hand Moderate depletion  Patellar Region Severe depletion  Anterior Thigh Region Severe depletion  Posterior Calf Region Moderate depletion  Edema (RD Assessment) None  Hair Reviewed  Eyes Reviewed  Mouth Reviewed  Skin Reviewed  Nails Reviewed   Diet Order:   Diet Order             Diet NPO time specified  Diet effective midnight                  EDUCATION NEEDS:  No education needs have been identified at this time  Skin:  Skin Assessment: Skin Integrity Issues: Skin Integrity Issues:: Stage II Stage II: sacrum  Last BM:  10/10 - type 7  Height:  Ht Readings from Last 1 Encounters:  03/17/21 5' 2" (1.575 m)   Weight:  Wt Readings from Last 1 Encounters:  03/20/21 46.8 kg   Ideal Body Weight:  50 kg  BMI:  Body mass index is 18.87  kg/m.  Estimated Nutritional Needs:  Kcal:  1500-1700 kcal/d Protein:  75-85 g/d Fluid:  1.2-1.4L/day  Ranell Patrick, RD, LDN Clinical Dietitian Pager on [place]

## 2021-03-21 ENCOUNTER — Other Ambulatory Visit: Payer: Self-pay | Admitting: Psychiatry

## 2021-03-21 DIAGNOSIS — F061 Catatonic disorder due to known physiological condition: Secondary | ICD-10-CM | POA: Diagnosis not present

## 2021-03-21 LAB — GLUCOSE, CAPILLARY
Glucose-Capillary: 151 mg/dL — ABNORMAL HIGH (ref 70–99)
Glucose-Capillary: 141 mg/dL — ABNORMAL HIGH (ref 70–99)
Glucose-Capillary: 200 mg/dL — ABNORMAL HIGH (ref 70–99)
Glucose-Capillary: 242 mg/dL — ABNORMAL HIGH (ref 70–99)

## 2021-03-21 LAB — CREATININE, SERUM
Creatinine, Ser: 0.57 mg/dL (ref 0.44–1.00)
GFR, Estimated: >60 mL/min (ref >60)

## 2021-03-21 MED ORDER — METFORMIN HCL 500 MG PO TABS: 500.0000 mg | ORAL_TABLET | Freq: Two times a day (BID) | ORAL | Status: DC

## 2021-03-21 MED ORDER — INSULIN GLARGINE-YFGN 100 UNIT/ML ~~LOC~~ SOLN
10.0000 [IU] | Freq: Every day | SUBCUTANEOUS | Status: DC
  Administered 2021-03-21: 10 [IU] via SUBCUTANEOUS
  Filled 2021-03-21 (×3): qty 0.1

## 2021-03-21 NOTE — Plan of Care (Signed)
No acute events this shift. Problem: Clinical Measurements: Goal: Ability to maintain clinical measurements within normal limits will improve Outcome: Progressing Goal: Will remain free from infection Outcome: Progressing Goal: Diagnostic test results will improve Outcome: Progressing Goal: Respiratory complications will improve Outcome: Progressing Goal: Cardiovascular complication will be avoided Outcome: Progressing   Problem: Activity: Goal: Risk for activity intolerance will decrease Outcome: Progressing   Problem: Nutrition: Goal: Adequate nutrition will be maintained Outcome: Progressing   Problem: Coping: Goal: Level of anxiety will decrease Outcome: Progressing   Problem: Elimination: Goal: Will not experience complications related to bowel motility Outcome: Progressing Goal: Will not experience complications related to urinary retention Outcome: Progressing   Problem: Pain Managment: Goal: General experience of comfort will improve Outcome: Progressing   Problem: Safety: Goal: Ability to remain free from injury will improve Outcome: Progressing   Problem: Skin Integrity: Goal: Risk for impaired skin integrity will decrease Outcome: Progressing   

## 2021-03-21 NOTE — Care Management Important Message (Signed)
Important Message  Patient Details  Name: Erica Mueller MRN: [id number] Date of Birth: [birth year]   Medicare Important Message Given:  Yes ((late documentation IM given on 10 10 22))     Bernadette Hoit 03/21/2021, 10:54 AM

## 2021-03-21 NOTE — Progress Notes (Signed)
OT Cancellation Note  Patient Details Name: Erica Mueller MRN: [id number] DOB: 02/15/1951   Cancelled Treatment:    Reason Eval/Treat Not Completed: Patient declined, no reason specified. Upon attempt, pt alert and able to engage in simple chit chat conversation, reports her name is [place]. Pt declines to engage in ADL tasks with OT despite encouragement. Mitts on hands, pt fidgety. Pt does not provide reason for decline but agreeable to OT re-attempting next date.  Arman Filter., MPH, MS, OTR/L ascom [phone] 03/21/21, 5:06 PM

## 2021-03-21 NOTE — Consult Note (Signed)
[facility] Face-to-Face Psychiatry Consult   Reason for Consult: Follow-up consult 70 year old woman with a history of schizophrenia currently getting ECT for catatonia Referring Physician: Allena Katz Patient Identification: DRAVEN LAINE MRN:  [id number] Principal Diagnosis: Catatonia Diagnosis:  Principal Problem:   Catatonia Active Problems:   Type 2 diabetes mellitus with hyperglycemia, without long-term current use of insulin (HCC)   Essential hypertension   Schizophrenia (HCC)   Sepsis (HCC)   Pressure injury of skin   Protein-calorie malnutrition, severe   Failure to thrive in adult   Hypernatremia   Hypokalemia   AKI (acute kidney injury) (HCC)   COVID-19 virus infection   PEG tube malfunction (HCC)   Hypophosphatemia   Total Time spent with patient: 30 minutes  Subjective:   Erica Mueller is a 70 y.o. female patient admitted with "I am feeling okay tonight".  HPI: Patient seen chart reviewed.  She spoke to me the most complete sentence I have heard from her since beginning this course of treatment.  Staff report that she got up out of bed today she is eating better interacting more.  Smiling affect brighter.  Still appears cognitively impaired and slowed.  Starting to make some progress though with physical therapy.  Past Psychiatric History: Past history of longstanding schizophrenia or schizoaffective disorder  Risk to Self:   Risk to Others:   Prior Inpatient Therapy:   Prior Outpatient Therapy:    Past Medical History:  Past Medical History:  Diagnosis Date   Coronary artery disease    Depression    Diabetes mellitus without complication (HCC)    Elevated lipids    GERD (gastroesophageal reflux disease)    Hypertension    Schizophrenia (HCC)    Syphilis (acquired)     Past Surgical History:  Procedure Laterality Date   COLONOSCOPY     COLONOSCOPY WITH PROPOFOL N/A 09/01/2015   Procedure: COLONOSCOPY WITH PROPOFOL;  Surgeon: Wallace Cullens, MD;  Location: [facility]  ENDOSCOPY;  Service: Gastroenterology;  Laterality: N/A;   IR FLUORO RM 30-60 MIN  02/23/2021   IR GASTROSTOMY TUBE MOD SED  02/24/2021   IR REPLC GASTRO/COLONIC TUBE PERCUT W/FLUORO  02/27/2021   TUBAL LIGATION     wisdom teeth removal     Family History:  Family History  Problem Relation Age of Onset   Cancer Mother    Alcoholism Mother    Bone cancer Father    Heart disease Father    Diabetes Maternal Grandmother    Suicidality Paternal Uncle    Breast cancer Neg Hx    Family Psychiatric  History: See previous Social History:  Social History   Substance and Sexual Activity  Alcohol Use Yes   Alcohol/week: 3.0 standard drinks   Types: 2 Cans of beer, 1 Shots of liquor per week     Social History   Substance and Sexual Activity  Drug Use Not Currently   Types: Marijuana   Comment: PAST     Social History   Socioeconomic History   Marital status: Divorced    Spouse name: Not on file   Number of children: Not on file   Years of education: Not on file   Highest education level: Not on file  Occupational History   Not on file  Tobacco Use   Smoking status: Every Day    Packs/day: 1.00    Years: 48.00    Pack years: 48.00    Types: Cigarettes    Start date: 12/20/1969   Smokeless  tobacco: Never  Vaping Use   Vaping Use: Never used  Substance and Sexual Activity   Alcohol use: Yes    Alcohol/week: 3.0 standard drinks    Types: 2 Cans of beer, 1 Shots of liquor per week   Drug use: Not Currently    Types: Marijuana    Comment: PAST    Sexual activity: Not Currently    Birth control/protection: None  Other Topics Concern   Not on file  Social History Narrative   Not on file   Social Determinants of Health   Financial Resource Strain: Not on file  Food Insecurity: Not on file  Transportation Needs: Not on file  Physical Activity: Not on file  Stress: Not on file  Social Connections: Not on file   Additional Social History:    Allergies:  No Known  Allergies  Labs:  Results for orders placed or performed during the hospital encounter of 02/07/21 (from the past 48 hour(s))  Glucose, capillary     Status: Abnormal   Collection Time: 03/19/21  7:45 PM  Result Value Ref Range   Glucose-Capillary 132 (H) 70 - 99 mg/dL    Comment: Glucose reference range applies only to samples taken after fasting for at least 8 hours.  Glucose, capillary     Status: Abnormal   Collection Time: 03/19/21 11:26 PM  Result Value Ref Range   Glucose-Capillary 217 (H) 70 - 99 mg/dL    Comment: Glucose reference range applies only to samples taken after fasting for at least 8 hours.  Glucose, capillary     Status: Abnormal   Collection Time: 03/20/21  4:35 AM  Result Value Ref Range   Glucose-Capillary 129 (H) 70 - 99 mg/dL    Comment: Glucose reference range applies only to samples taken after fasting for at least 8 hours.   Comment 1 Notify RN    Comment 2 Document in Chart   Glucose, capillary     Status: Abnormal   Collection Time: 03/20/21  7:40 AM  Result Value Ref Range   Glucose-Capillary 115 (H) 70 - 99 mg/dL    Comment: Glucose reference range applies only to samples taken after fasting for at least 8 hours.  Glucose, capillary     Status: Abnormal   Collection Time: 03/20/21 10:34 AM  Result Value Ref Range   Glucose-Capillary 129 (H) 70 - 99 mg/dL    Comment: Glucose reference range applies only to samples taken after fasting for at least 8 hours.  Glucose, capillary     Status: Abnormal   Collection Time: 03/20/21  2:09 PM  Result Value Ref Range   Glucose-Capillary 136 (H) 70 - 99 mg/dL    Comment: Glucose reference range applies only to samples taken after fasting for at least 8 hours.  Glucose, capillary     Status: Abnormal   Collection Time: 03/20/21  4:30 PM  Result Value Ref Range   Glucose-Capillary 212 (H) 70 - 99 mg/dL    Comment: Glucose reference range applies only to samples taken after fasting for at least 8 hours.   Glucose, capillary     Status: Abnormal   Collection Time: 03/20/21  9:04 PM  Result Value Ref Range   Glucose-Capillary 259 (H) 70 - 99 mg/dL    Comment: Glucose reference range applies only to samples taken after fasting for at least 8 hours.   Comment 1 Notify RN    Comment 2 Document in Chart   Glucose, capillary  Status: Abnormal   Collection Time: 03/21/21  1:09 AM  Result Value Ref Range   Glucose-Capillary 151 (H) 70 - 99 mg/dL    Comment: Glucose reference range applies only to samples taken after fasting for at least 8 hours.  Glucose, capillary     Status: Abnormal   Collection Time: 03/21/21  4:24 AM  Result Value Ref Range   Glucose-Capillary 141 (H) 70 - 99 mg/dL    Comment: Glucose reference range applies only to samples taken after fasting for at least 8 hours.  Glucose, capillary     Status: Abnormal   Collection Time: 03/21/21  8:11 AM  Result Value Ref Range   Glucose-Capillary 200 (H) 70 - 99 mg/dL    Comment: Glucose reference range applies only to samples taken after fasting for at least 8 hours.  Creatinine, serum     Status: None   Collection Time: 03/21/21  8:49 AM  Result Value Ref Range   Creatinine, Ser 0.57 0.44 - 1.00 mg/dL   GFR, Estimated >35 >00 mL/min    Comment: (NOTE) Calculated using the CKD-EPI Creatinine Equation (2021) Performed at [facility], [address] [place], [place] [address]   Glucose, capillary     Status: Abnormal   Collection Time: 03/21/21 11:53 AM  Result Value Ref Range   Glucose-Capillary 242 (H) 70 - 99 mg/dL    Comment: Glucose reference range applies only to samples taken after fasting for at least 8 hours.    Current Facility-Administered Medications  Medication Dose Route Frequency Provider Last Rate Last Admin   acetaminophen (TYLENOL) suppository 650 mg  650 mg Rectal Q4H PRN Jimmye Norman, NP       acetaminophen (TYLENOL) tablet 650 mg  650 mg Oral Q4H PRN Harlon Ditty D, NP        ascorbic acid (VITAMIN C) tablet 500 mg  500 mg Per Tube Daily Charise Killian, MD   500 mg at 03/21/21 1005   chlorhexidine (PERIDEX) 0.12 % solution 15 mL  15 mL Mouth Rinse BID Alberteen Sam, MD   15 mL at 03/21/21 1005   Chlorhexidine Gluconate Cloth 2 % PADS 6 each  6 each Topical Daily Erin Fulling, MD   6 each at 03/19/21 0918   cholecalciferol (VITAMIN D3) tablet 1,000 Units  1,000 Units Oral Daily [name], MD   1,000 Units at 03/21/21 1004   docusate sodium (COLACE) capsule 100 mg  100 mg Oral BID PRN Judithe Modest, NP       enoxaparin (LOVENOX) injection 40 mg  40 mg Subcutaneous Q24H Bari Mantis A, RPH   40 mg at 03/21/21 0552   feeding supplement (NEPRO CARB STEADY) liquid 237 mL  237 mL Oral BID BM Lynn Ito, MD   237 mL at 03/21/21 1304   feeding supplement (OSMOLITE 1.2 CAL) liquid 1,000 mL  1,000 mL Per Tube Continuous [name], MD   Stopped at 03/19/21 2330   free water 60 mL  60 mL Per Tube Q4H Lynn Ito, MD   60 mL at 03/21/21 1600   glimepiride (AMARYL) tablet 2 mg  2 mg Oral Daily [name], MD   2 mg at 03/21/21 1004   insulin aspart (novoLOG) injection 0-9 Units  0-9 Units Subcutaneous Q4H Alford Highland, MD   3 Units at 03/21/21 1207   insulin glargine-yfgn (SEMGLEE) injection 10 Units  10 Units Subcutaneous Daily [name], MD   10 Units at 03/21/21 1304  MEDLINE mouth rinse  15 mL Mouth Rinse q12n4p Alberteen Sam, MD   15 mL at 03/21/21 1809   metoprolol tartrate (LOPRESSOR) 25 mg/10 mL oral suspension 12.5 mg  12.5 mg Per Tube BID Alford Highland, MD   12.5 mg at 03/21/21 1005   ondansetron (ZOFRAN) injection 4 mg  4 mg Intravenous Q6H PRN Judithe Modest, NP       polyethylene glycol (MIRALAX / GLYCOLAX) packet 17 g  17 g Oral Daily [name], MD   17 g at 03/21/21 1005   pravastatin (PRAVACHOL) tablet 40 mg  40 mg Oral q1800 [name], MD   40 mg at 03/20/21 1710   senna-docusate (Senokot-S) tablet 1 tablet  1  tablet Oral BID [name], MD   1 tablet at 03/21/21 6948   thiothixene (NAVANE) capsule 2 mg  2 mg Per Tube BID Camdin Hegner, Jackquline Denmark, MD   2 mg at 03/21/21 1005    Musculoskeletal: Strength & Muscle Tone: decreased Gait & Station: unable to stand Patient leans: N/A            Psychiatric Specialty Exam:  Presentation  General Appearance:  No data recorded Eye Contact: No data recorded Speech: No data recorded Speech Volume: No data recorded Handedness: No data recorded  Mood and Affect  Mood: No data recorded Affect: No data recorded  Thought Process  Thought Processes: No data recorded Descriptions of Associations:No data recorded Orientation:No data recorded Thought Content:No data recorded History of Schizophrenia/Schizoaffective disorder:No data recorded Duration of Psychotic Symptoms:No data recorded Hallucinations:No data recorded Ideas of Reference:No data recorded Suicidal Thoughts:No data recorded Homicidal Thoughts:No data recorded  Sensorium  Memory: No data recorded Judgment: No data recorded Insight: No data recorded  Executive Functions  Concentration: No data recorded Attention Span: No data recorded Recall: No data recorded Fund of Knowledge: No data recorded Language: No data recorded  Psychomotor Activity  Psychomotor Activity: No data recorded  Assets  Assets: No data recorded  Sleep  Sleep: No data recorded  Physical Exam: Physical Exam Vitals and nursing note reviewed.  Constitutional:      Appearance: Normal appearance.  HENT:     Head: Normocephalic and atraumatic.     Mouth/Throat:     Pharynx: Oropharynx is clear.  Eyes:     Pupils: Pupils are equal, round, and reactive to light.  Cardiovascular:     Rate and Rhythm: Normal rate and regular rhythm.  Pulmonary:     Effort: Pulmonary effort is normal.     Breath sounds: Normal breath sounds.  Abdominal:     General: Abdomen is flat.      Palpations: Abdomen is soft.  Musculoskeletal:        General: Normal range of motion.  Skin:    General: Skin is warm and dry.  Neurological:     General: No focal deficit present.     Mental Status: She is alert. Mental status is at baseline.  Psychiatric:        Attention and Perception: She is inattentive.        Mood and Affect: Mood normal. Affect is blunt.        Speech: Speech is delayed.        Behavior: Behavior is slowed.        Thought Content: Thought content normal. Thought content does not include homicidal or suicidal ideation.        Cognition and Memory: Cognition is impaired. Memory is impaired.  Review of Systems  Constitutional: Negative.   HENT: Negative.    Eyes: Negative.   Respiratory: Negative.    Cardiovascular: Negative.   Gastrointestinal: Negative.   Musculoskeletal: Negative.   Skin: Negative.   Neurological: Negative.   Psychiatric/Behavioral: Negative.    Blood pressure (!) 127/53, pulse 76, temperature 97.7 F (36.5 C), resp. rate 16, height 5\' 2"  (1.575 m), weight 47.5 kg, SpO2 100 %. Body mass index is 19.15 kg/m.  Treatment Plan Summary: Plan patient is on the schedule for ECT tomorrow.  Continue ECT which she has shown significant benefit.  Probably having memory problems but overall mental status is improved.  No change to medicine.  Encourage patient in eating and cooperating with physical therapy  Disposition:  Continue ECT with hopes that we can get her back to an improved baseline before discharge  , MD 03/21/2021 6:36 PM

## 2021-03-21 NOTE — Progress Notes (Addendum)
Physical Therapy Treatment Patient Details Name: Erica Mueller MRN: [id number] DOB: 03-30-1951 Today's Date: 03/21/2021   History of Present Illness Pt is a 70 y/o F with PMH:  schizophrenia, HTN, GERD, type 2 diabetes mellitus,  depression and CAD. Pt presented on 02/07/21 d/t severe sepsis 2/2 COVID-19 infection. PEG tube placed 9/16 and tube changed 9/19. Pt has been catatonic. Phsychiatry started pt back on her baseline psych meds as well as ECT. Pt has become more interactive.    PT Comments    Pt lethargic beginning of session, but awakens easily with multimodal cues, soft mitts in place, doffed for PT, donned at end of session. Pt appears more alert and conversive this treatment session compared to previous ones. She is able to follow one step commands with patience and repeated cues. Sitting balance improved this session and pt able to perform seated weight shifts w/ LOB x1 requiring PT assist. MAX-A for transfers w/ HHA bed > chair but pt agreeable and does not resist transfers. Discharge recommendations remain SNF. Skilled PT intervention is indicated to address deficits in function, mobility, and to return to PLOF as able.    Recommendations for follow up therapy are one component of a multi-disciplinary discharge planning process, led by the attending physician.  Recommendations may be updated based on patient status, additional functional criteria and insurance authorization.  Follow Up Recommendations  SNF;Supervision for mobility/OOB     Equipment Recommendations  None recommended by PT    Recommendations for Other Services       Precautions / Restrictions Precautions Precautions: Fall Precaution Comments: G-tube Restrictions Weight Bearing Restrictions: No     Mobility  Bed Mobility Overal bed mobility: Needs Assistance Bed Mobility: Supine to Sit     Supine to sit: Min assist;HOB elevated     General bed mobility comments: Min A for trunk assist & scooting to  EOB  able to move BLE to EOB and scoots with increased time.    Transfers Overall transfer level: Needs assistance Equipment used: 1 person hand held assist Transfers: Sit to/from [employer] Sit to Stand: Mod assist Stand pivot transfers: Max assist       General transfer comment: Pt prefers to hold onto PT, but demonstrated improvement in forward lean this session during STS; MAX A for stand pivot transfer, pt unable to move BLE without tactile cues and unable to support weight  Ambulation/Gait                 Stairs             Wheelchair Mobility    Modified Rankin (Stroke Patients Only)       Balance Overall balance assessment: Needs assistance Sitting-balance support: Feet supported;Bilateral upper extremity supported Sitting balance-Leahy Scale: Poor Sitting balance - Comments: able to maintain trunk position when scooted to EOB, but does require support intermittently Postural control: Posterior lean Standing balance support: Bilateral upper extremity supported Standing balance-Leahy Scale: Zero Standing balance comment: MAX A for static standing                            Cognition Arousal/Alertness: Awake/alert Behavior During Therapy: WFL for tasks assessed/performed Overall Cognitive Status: History of cognitive impairments - at baseline Area of Impairment: Orientation;Memory;Attention;Safety/judgement;Awareness;Problem solving;Following commands                 Orientation Level: Disoriented to;Place;Time;Situation;Person   Memory: Decreased recall of precautions;Decreased short-term memory  Following Commands: Follows one step commands with increased time;Follows one step commands inconsistently Safety/Judgement: Decreased awareness of safety;Decreased awareness of deficits   Problem Solving: Difficulty sequencing;Decreased initiation;Requires verbal cues;Requires tactile cues General Comments: more alert  and conversive this session      Exercises Other Exercises Other Exercises: Sitting balance w/ weight shift requiring max multimodal cues for participation, pt was able to shift weight with LOB x 1 posteriorly requiring physical assist    General Comments        Pertinent Vitals/Pain Pain Assessment: Faces Faces Pain Scale: No hurt Pain Intervention(s): Monitored during session    Home Living                      Prior Function            PT Goals (current goals can now be found in the care plan section) Progress towards PT goals: Progressing toward goals    Frequency    Min 2X/week      PT Plan Current plan remains appropriate    Co-evaluation              AM-PAC PT "6 Clicks" Mobility   Outcome Measure  Help needed turning from your back to your side while in a flat bed without using bedrails?: A Lot Help needed moving from lying on your back to sitting on the side of a flat bed without using bedrails?: A Lot Help needed moving to and from a bed to a chair (including a wheelchair)?: Total Help needed standing up from a chair using your arms (e.g., wheelchair or bedside chair)?: A Lot Help needed to walk in hospital room?: Total Help needed climbing 3-5 steps with a railing? : Total 6 Click Score: 9    End of Session Equipment Utilized During Treatment: Gait belt Activity Tolerance: Patient tolerated treatment well Patient left: in chair;with call bell/phone within reach;with chair alarm set Nurse Communication: Mobility status PT Visit Diagnosis: Unsteadiness on feet (R26.81);Other abnormalities of gait and mobility (R26.89);Muscle weakness (generalized) (M62.81);Difficulty in walking, not elsewhere classified (R26.2)     Time: 6659-9357 PT Time Calculation (min) (ACUTE ONLY): 28 min  Charges:                        [employer], SPT

## 2021-03-21 NOTE — Progress Notes (Addendum)
[facility]  - [facility] [facility]   PATIENT NAME: Erica Mueller    MR#:  [id number]  DATE OF BIRTH:  September 02, 1950  SUBJECTIVE:  more awake this morning Repeated Good morning after me. Does not hold conversation  REVIEW OF SYSTEMS:   Review of Systems  Unable to perform ROS: Psychiatric disorder  Tolerating Diet:PEG feeding Tolerating PT: snf  DRUG ALLERGIES:  No Known Allergies  VITALS:  Blood pressure (!) 125/51, pulse 82, temperature 97.6 F (36.4 C), temperature source Oral, resp. rate 16, height 5\' 2"  (1.575 m), weight 47.5 kg, SpO2 99 %.  PHYSICAL EXAMINATION:   Physical Exam  GENERAL:  70 y.o.-year-old patient lying in the bed with no acute distress.  LUNGS: Normal breath sounds bilaterally, no wheezing, rales, rhonchi. No use of accessory muscles of respiration.  CARDIOVASCULAR: S1, S2 normal. No murmurs, rubs, or gallops.  ABDOMEN: Soft, nontender, nondistended. Bowel sounds present. No organomegaly or mass. PEG+ EXTREMITIES: No cyanosis, clubbing or edema b/l.    NEUROLOGIC: moves all extremities well spont PSYCHIATRIC:  patient is alert   LABORATORY PANEL:  CBC Recent Labs  Lab 03/16/21 0242  WBC 4.5  HGB 10.7*  HCT 33.0*  PLT 316     Chemistries  Recent Labs  Lab 03/16/21 0242 03/21/21 0849  NA 136  --   K 4.4  --   CL 103  --   CO2 27  --   GLUCOSE 222*  --   BUN 29*  --   CREATININE 0.44 0.57  CALCIUM 9.3  --     Cardiac Enzymes No results for input(s): TROPONINI in the last 168 hours. RADIOLOGY:  No results found. ASSESSMENT AND PLAN:  Patient admitted 02/07/2021 with severe sepsis COVID-19 infection.  Past medical history of schizophrenia, hypertension, GERD, type 2 diabetes mellitus and depression and CAD.  The patient had a prolonged hospital course and never regained mental status.  She was treated for electrolyte abnormalities including hypernatremia, hypokalemia and hypophosphatemia.  She had an NG tube for 2  weeks and then a PEG tube was placed on 02/24/2021.  There was a PEG tube malfunction 9/19. IR was able to change over the guidewire.  Seen by psychiatry and restarted on psychiatric medications but still catatonic.  Psychiatry started ECT treatment.  Schizophrenia with catatonia. --No communication initially now more interactive --s/p ETC on 9/28 and 9/30 --10/5  Recommended holding off on ECT at this time and will reevaluate on daily basis --Continue her current antipsychotic  Thiothexene --PT OT consulted--recommend rehab. Pt will d/c to [facility] once cleared by Psych - 10/7--ECT today --10/8--more awake and alert --10/9-- plans for ECT on Monday NPO after midnight --10/10--ECT today --10/11 remains awake and alert   Recent COVID infection. Severe sepsis secondary to aspiration pneumonia. Aspiration pneumonia. ON RA   Failure to thrive. Severe protein calorie malnutrition. Continue tube feeding per RD recommendation   Dm-2 with hyperglycemia -- cont glimipride --start Semglee 10 unit qd along with SSI --hold metformin      DVT prophylaxis: Lovenox Code Status: full Family Communication:  none Disposition Plan: To be discussed with Dr clapacs when he thinks pt can /dc since currently she is getting ECT   Status is: Inpatient   Getting ECT     TOTAL TIME TAKING CARE OF THIS PATIENT: 20 minutes.  >50% time spent on counselling and coordination of care  Note: This dictation was prepared with Dragon dictation along with smaller phrase technology. Any transcriptional  errors that result from this process are unintentional.  [name] M.D    [facility]   CC: Primary care physician; [facility] Patient ID: CALIANNA KIM, female   DOB: Jan 24, 1951, 70 y.o.   MRN: [id number]

## 2021-03-22 ENCOUNTER — Inpatient Hospital Stay: Payer: Medicare Other | Admitting: Anesthesiology

## 2021-03-22 ENCOUNTER — Encounter: Payer: Self-pay | Admitting: Family Medicine

## 2021-03-22 ENCOUNTER — Inpatient Hospital Stay: Payer: Medicare Other

## 2021-03-22 DIAGNOSIS — E43 Unspecified severe protein-calorie malnutrition: Secondary | ICD-10-CM | POA: Diagnosis not present

## 2021-03-22 DIAGNOSIS — F202 Catatonic schizophrenia: Secondary | ICD-10-CM | POA: Diagnosis not present

## 2021-03-22 DIAGNOSIS — E1165 Type 2 diabetes mellitus with hyperglycemia: Secondary | ICD-10-CM | POA: Diagnosis not present

## 2021-03-22 LAB — GLUCOSE, CAPILLARY
Glucose-Capillary: 88 mg/dL (ref 70–99)
Glucose-Capillary: 267 mg/dL — ABNORMAL HIGH (ref 70–99)
Glucose-Capillary: 115 mg/dL — ABNORMAL HIGH (ref 70–99)
Glucose-Capillary: 126 mg/dL — ABNORMAL HIGH (ref 70–99)

## 2021-03-22 LAB — CBC
HCT: 34.6 % — ABNORMAL LOW (ref 36.0–46.0)
Hemoglobin: 10.9 g/dL — ABNORMAL LOW (ref 12.0–15.0)
MCH: 30.3 pg (ref 26.0–34.0)
MCHC: 31.5 g/dL (ref 30.0–36.0)
MCV: 96.1 fL (ref 80.0–100.0)
Platelets: 251 10*3/uL (ref 150–400)
RBC: 3.6 MIL/uL — ABNORMAL LOW (ref 3.87–5.11)
RDW: 14.5 % (ref 11.5–15.5)
WBC: 4.2 10*3/uL (ref 4.0–10.5)
nRBC: 0 % (ref 0.0–0.2)

## 2021-03-22 LAB — BASIC METABOLIC PANEL
Anion gap: 9 (ref 5–15)
BUN: 20 mg/dL (ref 8–23)
CO2: 28 mmol/L (ref 22–32)
Calcium: 9.6 mg/dL (ref 8.9–10.3)
Chloride: 102 mmol/L (ref 98–111)
Creatinine, Ser: 0.4 mg/dL — ABNORMAL LOW (ref 0.44–1.00)
GFR, Estimated: >60 mL/min (ref >60)
Glucose, Bld: 120 mg/dL — ABNORMAL HIGH (ref 70–99)
Potassium: 4.2 mmol/L (ref 3.5–5.1)
Sodium: 139 mmol/L (ref 135–145)

## 2021-03-22 MED ORDER — SODIUM CHLORIDE 0.9 % IV SOLN: INTRAVENOUS | Status: DC | PRN

## 2021-03-22 MED ORDER — KETOROLAC TROMETHAMINE 30 MG/ML IJ SOLN
INTRAMUSCULAR | Status: AC
  Administered 2021-03-22: 30 mg via INTRAVENOUS
  Filled 2021-03-22: qty 1

## 2021-03-22 MED ORDER — SUCCINYLCHOLINE CHLORIDE 200 MG/10ML IV SOSY
PREFILLED_SYRINGE | INTRAVENOUS | Status: DC | PRN
  Administered 2021-03-22: 70 mg via INTRAVENOUS

## 2021-03-22 MED ORDER — KETOROLAC TROMETHAMINE 30 MG/ML IJ SOLN
30.0000 mg | Freq: Once | INTRAMUSCULAR | Status: AC
Start: 2021-03-22 — End: 2021-03-22

## 2021-03-22 MED ORDER — GLYCOPYRROLATE 0.2 MG/ML IJ SOLN
INTRAMUSCULAR | Status: AC
  Administered 2021-03-22: 0.2 mg via INTRAVENOUS
  Filled 2021-03-22: qty 1

## 2021-03-22 MED ORDER — SODIUM CHLORIDE 0.9 % IV SOLN
500.0000 mL | Freq: Once | INTRAVENOUS | Status: AC
  Administered 2021-03-22: 500 mL via INTRAVENOUS

## 2021-03-22 MED ORDER — GLYCOPYRROLATE 0.2 MG/ML IJ SOLN
0.2000 mg | Freq: Once | INTRAMUSCULAR | Status: AC
  Filled 2021-03-22: qty 1

## 2021-03-22 MED ORDER — METHOHEXITAL SODIUM 100 MG/10ML IV SOSY
PREFILLED_SYRINGE | INTRAVENOUS | Status: DC | PRN
  Administered 2021-03-22: 50 mg via INTRAVENOUS

## 2021-03-22 NOTE — Anesthesia Procedure Notes (Signed)
Date/Time: 03/22/2021 1:17 PM Performed by: Stormy Fabian, CRNA Pre-anesthesia Checklist: Patient identified, Emergency Drugs available, Suction available and Patient being monitored Patient Re-evaluated:Patient Re-evaluated prior to induction Oxygen Delivery Method: Circle system utilized Preoxygenation: Pre-oxygenation with 100% oxygen Induction Type: IV induction Ventilation: Mask ventilation without difficulty and Mask ventilation throughout procedure Airway Equipment and Method: Bite block Placement Confirmation: positive ETCO2 Dental Injury: Teeth and Oropharynx as per pre-operative assessment

## 2021-03-22 NOTE — Progress Notes (Signed)
Patient arrived to pacu post ECT, not responding. #22g PIV RH (will leave per  ECT RN, flushed and locked)  accepting RN aware. Afebrile, vitals at baseline for patient per report. No resp distress. At end of stay in pacu, able to states "yes" to her name only.  Calm, cooperative.  Per Dr. Elberta Leatherwood baseline presently for patient

## 2021-03-22 NOTE — Anesthesia Preprocedure Evaluation (Signed)
Anesthesia Evaluation  Patient identified by MRN, date of birth, ID band Patient confused    Reviewed: Allergy & Precautions, NPO status , Patient's Chart, lab work & pertinent test results  History of Anesthesia Complications Negative for: history of anesthetic complications  Airway Mallampati: IV   Neck ROM: Full    Dental no notable dental hx.    Pulmonary Current Smoker and Patient abstained from smoking.,    Pulmonary exam normal breath sounds clear to auscultation       Cardiovascular hypertension, + CAD  Normal cardiovascular exam Rhythm:Regular Rate:Normal  ECG 01/11/21: Sinus tachycardia, LVH, repolarization abnormality, prolonged QT   Neuro/Psych PSYCHIATRIC DISORDERS (with catatonia) Depression Schizophrenia negative neurological ROS     GI/Hepatic GERD  ,  Endo/Other  diabetes, Type 2, Insulin Dependent  Renal/GU      Musculoskeletal   Abdominal   Peds  Hematology negative hematology ROS (+)   Anesthesia Other Findings FTT  Reproductive/Obstetrics                             Anesthesia Physical  Anesthesia Plan  ASA: 3  Anesthesia Plan: General   Post-op Pain Management:    Induction: Intravenous  PONV Risk Score and Plan: 2 and TIVA and Treatment may vary due to age or medical condition  Airway Management Planned: Natural Airway  Additional Equipment:   Intra-op Plan:   Post-operative Plan:   Informed Consent: I have reviewed the patients History and Physical, chart, labs and discussed the procedure including the risks, benefits and alternatives for the proposed anesthesia with the patient or authorized representative who has indicated his/her understanding and acceptance.     Plan/risks discussed with: serial consent on chart.  Plan Discussed with: CRNA  Anesthesia Plan Comments:         Anesthesia Quick Evaluation

## 2021-03-22 NOTE — Assessment & Plan Note (Signed)
--   Stable.  Continue insulin and oral agent.  Continue to hold metformin.

## 2021-03-22 NOTE — Assessment & Plan Note (Addendum)
--   Secondary to aspiration pneumonia, resolved 

## 2021-03-22 NOTE — Hospital Course (Signed)
70 year old woman PMH schizophrenia admitted August 30 for severe sepsis secondary to aspiration pneumonia, hospitalization prolonged with poor recovery of mental status, subsequently a PEG tube was placed.  Seen by psychiatry and started on medications but remained catatonic.  Started on ECT.

## 2021-03-22 NOTE — H&P (Signed)
Erica Mueller is an 70 y.o. female.   Chief Complaint: Patient has no complaint to express today HPI: History of schizophrenia recurrent now with a catatonic spell and depression starting to recover  Past Medical History:  Diagnosis Date   Coronary artery disease    Depression    Diabetes mellitus without complication (HCC)    Elevated lipids    GERD (gastroesophageal reflux disease)    Hypertension    Schizophrenia (HCC)    Syphilis (acquired)     Past Surgical History:  Procedure Laterality Date   COLONOSCOPY     COLONOSCOPY WITH PROPOFOL N/A 09/01/2015   Procedure: COLONOSCOPY WITH PROPOFOL;  Surgeon: Wallace Cullens, MD;  Location: [facility] ENDOSCOPY;  Service: Gastroenterology;  Laterality: N/A;   IR FLUORO RM 30-60 MIN  02/23/2021   IR GASTROSTOMY TUBE MOD SED  02/24/2021   IR REPLC GASTRO/COLONIC TUBE PERCUT W/FLUORO  02/27/2021   TUBAL LIGATION     wisdom teeth removal      Family History  Problem Relation Age of Onset   Cancer Mother    Alcoholism Mother    Bone cancer Father    Heart disease Father    Diabetes Maternal Grandmother    Suicidality Paternal Uncle    Breast cancer Neg Hx    Social History:  reports that she has been smoking cigarettes. She started smoking about 51 years ago. She has a 48.00 pack-year smoking history. She has never used smokeless tobacco. She reports current alcohol use of about 3.0 standard drinks per week. She reports that she does not currently use drugs after having used the following drugs: Marijuana.  Allergies: No Known Allergies  Medications Prior to Admission  Medication Sig Dispense Refill   amitriptyline (ELAVIL) 25 MG tablet Take 1 tablet (25 mg total) by mouth at bedtime. 14 tablet 0   chlorproMAZINE (THORAZINE) 25 MG tablet Take 25 mg by mouth 2 (two) times daily.     cholecalciferol (VITAMIN D3) 25 MCG (1000 UNIT) tablet Take 1,000 Units by mouth daily.     glimepiride (AMARYL) 2 MG tablet Take 2 mg by mouth daily.      lovastatin (MEVACOR) 40 MG tablet Take 1 tablet (40 mg total) by mouth at bedtime. 60 tablet 2   metFORMIN (GLUCOPHAGE) 1000 MG tablet Take 1,000 mg by mouth 2 (two) times daily.     metoprolol tartrate (LOPRESSOR) 25 MG tablet Take 1 tablet (25 mg total) by mouth 2 (two) times daily. 60 tablet 0   paliperidone (INVEGA) 6 MG 24 hr tablet Take 6 mg by mouth daily.     polyethylene glycol (MIRALAX / GLYCOLAX) 17 g packet Take 17 g by mouth daily.     senna-docusate (SENOKOT-S) 8.6-50 MG tablet Take 1 tablet by mouth at bedtime. 30 tablet 0   senna-docusate (SENOKOT-S) 8.6-50 MG tablet Take 1 tablet by mouth 2 (two) times daily. (0800 and 1600)     thiothixene (NAVANE) 2 MG capsule Take 1 capsule (2 mg total) by mouth 2 (two) times daily. 28 capsule 0   vitamin C (ASCORBIC ACID) 500 MG tablet Take 500 mg by mouth 2 (two) times daily.     zinc gluconate 50 MG tablet Take 50 mg by mouth daily.      Results for orders placed or performed during the hospital encounter of 02/07/21 (from the past 48 hour(s))  Glucose, capillary     Status: Abnormal   Collection Time: 03/20/21  2:09 PM  Result Value  Ref Range   Glucose-Capillary 136 (H) 70 - 99 mg/dL    Comment: Glucose reference range applies only to samples taken after fasting for at least 8 hours.  Glucose, capillary     Status: Abnormal   Collection Time: 03/20/21  4:30 PM  Result Value Ref Range   Glucose-Capillary 212 (H) 70 - 99 mg/dL    Comment: Glucose reference range applies only to samples taken after fasting for at least 8 hours.  Glucose, capillary     Status: Abnormal   Collection Time: 03/20/21  9:04 PM  Result Value Ref Range   Glucose-Capillary 259 (H) 70 - 99 mg/dL    Comment: Glucose reference range applies only to samples taken after fasting for at least 8 hours.   Comment 1 Notify RN    Comment 2 Document in Chart   Glucose, capillary     Status: Abnormal   Collection Time: 03/21/21  1:09 AM  Result Value Ref Range    Glucose-Capillary 151 (H) 70 - 99 mg/dL    Comment: Glucose reference range applies only to samples taken after fasting for at least 8 hours.  Glucose, capillary     Status: Abnormal   Collection Time: 03/21/21  4:24 AM  Result Value Ref Range   Glucose-Capillary 141 (H) 70 - 99 mg/dL    Comment: Glucose reference range applies only to samples taken after fasting for at least 8 hours.  Glucose, capillary     Status: Abnormal   Collection Time: 03/21/21  8:11 AM  Result Value Ref Range   Glucose-Capillary 200 (H) 70 - 99 mg/dL    Comment: Glucose reference range applies only to samples taken after fasting for at least 8 hours.  Creatinine, serum     Status: None   Collection Time: 03/21/21  8:49 AM  Result Value Ref Range   Creatinine, Ser 0.57 0.44 - 1.00 mg/dL   GFR, Estimated >02 >72 mL/min    Comment: (NOTE) Calculated using the CKD-EPI Creatinine Equation (2021) Performed at [facility], [address] [place], [place] [address]   Glucose, capillary     Status: Abnormal   Collection Time: 03/21/21 11:53 AM  Result Value Ref Range   Glucose-Capillary 242 (H) 70 - 99 mg/dL    Comment: Glucose reference range applies only to samples taken after fasting for at least 8 hours.  Glucose, capillary     Status: None   Collection Time: 03/21/21  4:51 PM  Result Value Ref Range   Glucose-Capillary 88 70 - 99 mg/dL    Comment: Glucose reference range applies only to samples taken after fasting for at least 8 hours.  Glucose, capillary     Status: Abnormal   Collection Time: 03/21/21  9:06 PM  Result Value Ref Range   Glucose-Capillary 267 (H) 70 - 99 mg/dL    Comment: Glucose reference range applies only to samples taken after fasting for at least 8 hours.   Comment 1 Notify RN   Glucose, capillary     Status: Abnormal   Collection Time: 03/21/21 11:22 PM  Result Value Ref Range   Glucose-Capillary 115 (H) 70 - 99 mg/dL    Comment: Glucose reference range applies only to  samples taken after fasting for at least 8 hours.   Comment 1 Notify RN   Glucose, capillary     Status: Abnormal   Collection Time: 03/22/21  3:42 AM  Result Value Ref Range   Glucose-Capillary 126 (H) 70 -  99 mg/dL    Comment: Glucose reference range applies only to samples taken after fasting for at least 8 hours.   Comment 1 Notify RN   Basic metabolic panel     Status: Abnormal   Collection Time: 03/22/21  6:48 AM  Result Value Ref Range   Sodium 139 135 - 145 mmol/L   Potassium 4.2 3.5 - 5.1 mmol/L   Chloride 102 98 - 111 mmol/L   CO2 28 22 - 32 mmol/L   Glucose, Bld 120 (H) 70 - 99 mg/dL    Comment: Glucose reference range applies only to samples taken after fasting for at least 8 hours.   BUN 20 8 - 23 mg/dL   Creatinine, Ser 1.32 (L) 0.44 - 1.00 mg/dL   Calcium 9.6 8.9 - 44.0 mg/dL   GFR, Estimated >10 >27 mL/min    Comment: (NOTE) Calculated using the CKD-EPI Creatinine Equation (2021)    Anion gap 9 5 - 15    Comment: Performed at [facility], [address] [place], [place] [address]  CBC     Status: Abnormal   Collection Time: 03/22/21  6:48 AM  Result Value Ref Range   WBC 4.2 4.0 - 10.5 K/uL   RBC 3.60 (L) 3.87 - 5.11 MIL/uL   Hemoglobin 10.9 (L) 12.0 - 15.0 g/dL   HCT 44.0 (L) 34.7 - 42.5 %   MCV 96.1 80.0 - 100.0 fL   MCH 30.3 26.0 - 34.0 pg   MCHC 31.5 30.0 - 36.0 g/dL   RDW 95.6 38.7 - 56.4 %   Platelets 251 150 - 400 K/uL   nRBC 0.0 0.0 - 0.2 %    Comment: Performed at [facility], [address] [place], [place] [address]   No results found.  Review of Systems  Constitutional: Negative.   HENT: Negative.    Eyes: Negative.   Respiratory: Negative.    Cardiovascular: Negative.   Gastrointestinal: Negative.   Musculoskeletal: Negative.   Skin: Negative.   Neurological: Negative.   Psychiatric/Behavioral: Negative.     Blood pressure 120/65, pulse 80, temperature 98.2 F (36.8 C), temperature source Oral, resp. rate 18,  height 5\' 2"  (1.575 m), weight 47.5 kg, SpO2 97 %. Physical Exam Vitals and nursing note reviewed.  Constitutional:      Appearance: She is well-developed. She is ill-appearing.  HENT:     Head: Normocephalic and atraumatic.  Eyes:     Conjunctiva/sclera: Conjunctivae normal.     Pupils: Pupils are equal, round, and reactive to light.  Cardiovascular:     Heart sounds: Normal heart sounds.  Pulmonary:     Effort: Pulmonary effort is normal.  Abdominal:     Palpations: Abdomen is soft.  Musculoskeletal:        General: Normal range of motion.     Cervical back: Normal range of motion.  Skin:    General: Skin is warm and dry.  Psychiatric:        Attention and Perception: She is inattentive.        Mood and Affect: Affect is blunt.        Speech: Speech is delayed.        Behavior: Behavior is slowed.        Thought Content: Thought content is not paranoid. Thought content does not include homicidal or suicidal ideation.        Cognition and Memory: Cognition is impaired. Memory is impaired.     Assessment/Plan Patient has  shown steady improvement and so we will continue this course of ECT while we see steady improvement with each treatment.  Mordecai Rasmussen, MD 03/22/2021, 12:27 PM

## 2021-03-22 NOTE — Assessment & Plan Note (Signed)
--   Continue ECT and medications per psychiatry

## 2021-03-22 NOTE — Anesthesia Postprocedure Evaluation (Signed)
Anesthesia Post Note  Patient: Erica Mueller  Procedure(s) Performed: ECT TX  Patient location during evaluation: PACU Anesthesia Type: General Level of consciousness: awake and alert, oriented and patient cooperative Pain management: pain level controlled Vital Signs Assessment: post-procedure vital signs reviewed and stable Respiratory status: spontaneous breathing, nonlabored ventilation and respiratory function stable Cardiovascular status: blood pressure returned to baseline and stable Postop Assessment: adequate PO intake Anesthetic complications: no   No notable events documented.   Last Vitals:  Vitals:   03/22/21 1332 03/22/21 1340  BP: (!) 183/95 (!) 169/78  Pulse: (!) 121 (!) 111  Resp: (!) 26 (!) 26  Temp: (!) 36.4 C 36.7 C  SpO2: 98% 98%    Last Pain:  Vitals:   03/22/21 1340  TempSrc:   PainSc: Asleep                 Reed Breech

## 2021-03-22 NOTE — Transfer of Care (Signed)
Immediate Anesthesia Transfer of Care Note  Patient: Erica Mueller  Procedure(s) Performed: ECT TX  Patient Location: PACU  Anesthesia Type:General  Level of Consciousness: drowsy  Airway & Oxygen Therapy: Patient Spontanous Breathing  Post-op Assessment: Report given to RN and Post -op Vital signs reviewed and stable  Post vital signs: Reviewed and stable  Last Vitals:  Vitals Value Taken Time  BP 183/95 03/22/21 1332  Temp 36.4 C 03/22/21 1332  Pulse 121 03/22/21 1332  Resp 26 03/22/21 1332  SpO2 98 % 03/22/21 1332    Last Pain:  Vitals:   03/22/21 1332  TempSrc:   PainSc: Asleep         Complications: No notable events documented.

## 2021-03-22 NOTE — Progress Notes (Signed)
  Progress Note    Erica Mueller   YKD:[id number]  DOB: 1951/02/12  DOA: 02/07/2021     43 Date of Service: 03/22/2021   70 year old woman PMH schizophrenia admitted August 30 for severe sepsis secondary to aspiration pneumonia, hospitalization prolonged with poor recovery of mental status, subsequently a PEG tube was placed.  Seen by psychiatry and started on medications but remained catatonic.  Started on ECT.  Subjective:  Speaks sometimes, but not currently  Hospital Problems * Sepsis (HCC) -- Secondary to aspiration pneumonia, resolved  COVID-19 virus infection -- Resolved  Schizophrenia (HCC) -- Continue ECT and medications per psychiatry  Type 2 diabetes mellitus with hyperglycemia, without long-term current use of insulin (HCC) -- Stable.  Continue insulin and oral agent.  Continue to hold metformin.    Objective Vital signs were reviewed and unremarkable.  Vitals:   03/22/21 1345 03/22/21 1400 03/22/21 1412 03/22/21 1614  BP: (!) 168/74 (!) 173/78 (!) 147/110 (!) 157/66  Pulse:  (!) 106 (!) 103 (!) 103  Resp:  (!) 29 18 15   Temp:  98.1 F (36.7 C) 97.9 F (36.6 C) 98.8 F (37.1 C)  TempSrc:   Axillary   SpO2:  98% 100% 100%  Weight:      Height:       47.5 kg  Exam Physical Exam Vitals reviewed.  Constitutional:      General: She is not in acute distress. Cardiovascular:     Rate and Rhythm: Normal rate and regular rhythm.     Heart sounds: No murmur heard. Pulmonary:     Effort: Pulmonary effort is normal.     Breath sounds: Normal breath sounds. No wheezing.  Musculoskeletal:     Right lower leg: No edema.     Left lower leg: No edema.  Neurological:     Mental Status: She is alert.  Psychiatric:     Comments: Looks at examiner but does not respond with intelligible speech    Labs / Other Information My review of labs, imaging, notes and other tests is significant for stable BMP, Hgb     Time spent: 20 minutes [facility] 03/22/2021, 6:54 PM

## 2021-03-22 NOTE — TOC Progression Note (Signed)
Transition of Care [facility]) - Progression Note    Patient Details  Name: Erica Mueller MRN: [id number] Date of Birth: 1950-09-27  [facility] CM/SW Contact  Marlowe Sax, RN Phone Number: 03/22/2021, 10:31 AM  Clinical Narrative:   Plan remains to return to [facility] at DC, continues to get ECT treatments, showing improvement, TOC will continue to follow the patient and assist in DC    Expected Discharge Plan: Long Term Nursing Home Barriers to Discharge: Continued Medical Work up  Expected Discharge Plan and Services Expected Discharge Plan: Long Term Nursing Home In-house Referral: Clinical Social Work   Post Acute Care Choice: Nursing Home Living arrangements for the past 2 months:  ([facility] / Long Term Care Facility)                                       Social Determinants of Health (SDOH) Interventions    Readmission Risk Interventions No flowsheet data found.

## 2021-03-22 NOTE — Assessment & Plan Note (Signed)
Resolved

## 2021-03-23 DIAGNOSIS — S91009A Unspecified open wound, unspecified ankle, initial encounter: Secondary | ICD-10-CM | POA: Diagnosis present

## 2021-03-23 DIAGNOSIS — E1165 Type 2 diabetes mellitus with hyperglycemia: Secondary | ICD-10-CM | POA: Diagnosis not present

## 2021-03-23 LAB — GLUCOSE, CAPILLARY
Glucose-Capillary: 127 mg/dL — ABNORMAL HIGH (ref 70–99)
Glucose-Capillary: 85 mg/dL (ref 70–99)
Glucose-Capillary: 140 mg/dL — ABNORMAL HIGH (ref 70–99)
Glucose-Capillary: 139 mg/dL — ABNORMAL HIGH (ref 70–99)
Glucose-Capillary: 64 mg/dL — ABNORMAL LOW (ref 70–99)
Glucose-Capillary: 134 mg/dL — ABNORMAL HIGH (ref 70–99)
Glucose-Capillary: 92 mg/dL (ref 70–99)

## 2021-03-23 MED ORDER — METOPROLOL TARTRATE 25 MG PO TABS
12.5000 mg | ORAL_TABLET | Freq: Two times a day (BID) | ORAL | Status: DC
  Administered 2021-03-23 – 2021-03-29 (×9): 12.5 mg via ORAL
  Filled 2021-03-23 (×11): qty 1

## 2021-03-23 MED ORDER — DEXTROSE 50 % IV SOLN
50.0000 mL | INTRAVENOUS | Status: AC
  Administered 2021-03-23: 50 mL via INTRAVENOUS
  Filled 2021-03-23: qty 50

## 2021-03-23 NOTE — Consult Note (Signed)
[facility] Face-to-Face Psychiatry Consult   Reason for Consult: Consult to follow-up with a 70 year old woman with history of schizophrenia now recovering from catatonia Referring Physician: Irene Limbo Patient Identification: Erica Mueller MRN:  [id number] Principal Diagnosis: Sepsis [facility]) Diagnosis:  Principal Problem:   Sepsis (HCC) Active Problems:   Type 2 diabetes mellitus with hyperglycemia, without long-term current use of insulin (HCC)   Essential hypertension   Schizophrenia (HCC)   Pressure injury of skin   Protein-calorie malnutrition, severe   Failure to thrive in adult   Hypernatremia   Hypokalemia   Catatonia   AKI (acute kidney injury) (HCC)   COVID-19 virus infection   PEG tube malfunction (HCC)   Hypophosphatemia   Ankle wound   Total Time spent with patient: 30 minutes  Subjective:   Erica Mueller is a 70 y.o. female patient admitted with "I am okay".  HPI: Patient seems about the same as yesterday.  Awake alert partially reacted.  Able to answer very basic questions but does not speak much on her own.  Blunted little activity from baseline and less forced.  Nevertheless does not appear to be catatonic.  Past Psychiatric History: Past history of schizophrenia or schizoaffective disorder  Risk to Self:   Risk to Others:   Prior Inpatient Therapy:   Prior Outpatient Therapy:    Past Medical History:  Past Medical History:  Diagnosis Date   Coronary artery disease    Depression    Diabetes mellitus without complication (HCC)    Elevated lipids    GERD (gastroesophageal reflux disease)    Hypertension    Schizophrenia (HCC)    Syphilis (acquired)     Past Surgical History:  Procedure Laterality Date   COLONOSCOPY     COLONOSCOPY WITH PROPOFOL N/A 09/01/2015   Procedure: COLONOSCOPY WITH PROPOFOL;  Surgeon: Wallace Cullens, MD;  Location: [facility] ENDOSCOPY;  Service: Gastroenterology;  Laterality: N/A;   IR FLUORO RM 30-60 MIN  02/23/2021   IR GASTROSTOMY TUBE MOD  SED  02/24/2021   IR REPLC GASTRO/COLONIC TUBE PERCUT W/FLUORO  02/27/2021   TUBAL LIGATION     wisdom teeth removal     Family History:  Family History  Problem Relation Age of Onset   Cancer Mother    Alcoholism Mother    Bone cancer Father    Heart disease Father    Diabetes Maternal Grandmother    Suicidality Paternal Uncle    Breast cancer Neg Hx    Family Psychiatric  History: Suicidality and father Social History:  Social History   Substance and Sexual Activity  Alcohol Use Yes   Alcohol/week: 3.0 standard drinks   Types: 2 Cans of beer, 1 Shots of liquor per week     Social History   Substance and Sexual Activity  Drug Use Not Currently   Types: Marijuana   Comment: PAST     Social History   Socioeconomic History   Marital status: Divorced    Spouse name: Not on file   Number of children: Not on file   Years of education: Not on file   Highest education level: Not on file  Occupational History   Not on file  Tobacco Use   Smoking status: Every Day    Packs/day: 1.00    Years: 48.00    Pack years: 48.00    Types: Cigarettes    Start date: 12/20/1969   Smokeless tobacco: Never  Vaping Use   Vaping Use: Never used  Substance and  Sexual Activity   Alcohol use: Yes    Alcohol/week: 3.0 standard drinks    Types: 2 Cans of beer, 1 Shots of liquor per week   Drug use: Not Currently    Types: Marijuana    Comment: PAST    Sexual activity: Not Currently    Birth control/protection: None  Other Topics Concern   Not on file  Social History Narrative   Not on file   Social Determinants of Health   Financial Resource Strain: Not on file  Food Insecurity: Not on file  Transportation Needs: Not on file  Physical Activity: Not on file  Stress: Not on file  Social Connections: Not on file   Additional Social History:    Allergies:  No Known Allergies  Labs:  Results for orders placed or performed during the hospital encounter of 02/07/21 (from the  past 48 hour(s))  Glucose, capillary     Status: Abnormal   Collection Time: 03/21/21  9:06 PM  Result Value Ref Range   Glucose-Capillary 267 (H) 70 - 99 mg/dL    Comment: Glucose reference range applies only to samples taken after fasting for at least 8 hours.   Comment 1 Notify RN   Glucose, capillary     Status: Abnormal   Collection Time: 03/21/21 11:22 PM  Result Value Ref Range   Glucose-Capillary 115 (H) 70 - 99 mg/dL    Comment: Glucose reference range applies only to samples taken after fasting for at least 8 hours.   Comment 1 Notify RN   Glucose, capillary     Status: Abnormal   Collection Time: 03/22/21  3:42 AM  Result Value Ref Range   Glucose-Capillary 126 (H) 70 - 99 mg/dL    Comment: Glucose reference range applies only to samples taken after fasting for at least 8 hours.   Comment 1 Notify RN   Basic metabolic panel     Status: Abnormal   Collection Time: 03/22/21  6:48 AM  Result Value Ref Range   Sodium 139 135 - 145 mmol/L   Potassium 4.2 3.5 - 5.1 mmol/L   Chloride 102 98 - 111 mmol/L   CO2 28 22 - 32 mmol/L   Glucose, Bld 120 (H) 70 - 99 mg/dL    Comment: Glucose reference range applies only to samples taken after fasting for at least 8 hours.   BUN 20 8 - 23 mg/dL   Creatinine, Ser 1.61 (L) 0.44 - 1.00 mg/dL   Calcium 9.6 8.9 - 09.6 mg/dL   GFR, Estimated >04 >54 mL/min    Comment: (NOTE) Calculated using the CKD-EPI Creatinine Equation (2021)    Anion gap 9 5 - 15    Comment: Performed at [facility], [address] [place], [place] [address]  CBC     Status: Abnormal   Collection Time: 03/22/21  6:48 AM  Result Value Ref Range   WBC 4.2 4.0 - 10.5 K/uL   RBC 3.60 (L) 3.87 - 5.11 MIL/uL   Hemoglobin 10.9 (L) 12.0 - 15.0 g/dL   HCT 91.4 (L) 78.2 - 95.6 %   MCV 96.1 80.0 - 100.0 fL   MCH 30.3 26.0 - 34.0 pg   MCHC 31.5 30.0 - 36.0 g/dL   RDW 21.3 08.6 - 57.8 %   Platelets 251 150 - 400 K/uL   nRBC 0.0 0.0 - 0.2 %    Comment:  Performed at [facility], [address] [place], [place] [address]  Glucose, capillary  Status: Abnormal   Collection Time: 03/22/21  4:38 PM  Result Value Ref Range   Glucose-Capillary 127 (H) 70 - 99 mg/dL    Comment: Glucose reference range applies only to samples taken after fasting for at least 8 hours.  Glucose, capillary     Status: None   Collection Time: 03/22/21  8:29 PM  Result Value Ref Range   Glucose-Capillary 85 70 - 99 mg/dL    Comment: Glucose reference range applies only to samples taken after fasting for at least 8 hours.   Comment 1 Notify RN   Glucose, capillary     Status: Abnormal   Collection Time: 03/22/21 11:20 PM  Result Value Ref Range   Glucose-Capillary 140 (H) 70 - 99 mg/dL    Comment: Glucose reference range applies only to samples taken after fasting for at least 8 hours.   Comment 1 Notify RN   Glucose, capillary     Status: Abnormal   Collection Time: 03/23/21  1:02 AM  Result Value Ref Range   Glucose-Capillary 139 (H) 70 - 99 mg/dL    Comment: Glucose reference range applies only to samples taken after fasting for at least 8 hours.  Glucose, capillary     Status: Abnormal   Collection Time: 03/23/21  4:45 AM  Result Value Ref Range   Glucose-Capillary 64 (L) 70 - 99 mg/dL    Comment: Glucose reference range applies only to samples taken after fasting for at least 8 hours.   Comment 1 Notify RN   Glucose, capillary     Status: Abnormal   Collection Time: 03/23/21  5:53 AM  Result Value Ref Range   Glucose-Capillary 134 (H) 70 - 99 mg/dL    Comment: Glucose reference range applies only to samples taken after fasting for at least 8 hours.  Glucose, capillary     Status: None   Collection Time: 03/23/21  8:16 AM  Result Value Ref Range   Glucose-Capillary 92 70 - 99 mg/dL    Comment: Glucose reference range applies only to samples taken after fasting for at least 8 hours.    Current Facility-Administered Medications   Medication Dose Route Frequency Provider Last Rate Last Admin   acetaminophen (TYLENOL) suppository 650 mg  650 mg Rectal Q4H PRN Jimmye Norman, NP       acetaminophen (TYLENOL) tablet 650 mg  650 mg Oral Q4H PRN Harlon Ditty D, NP       ascorbic acid (VITAMIN C) tablet 500 mg  500 mg Per Tube Daily Charise Killian, MD   500 mg at 03/23/21 1405   chlorhexidine (PERIDEX) 0.12 % solution 15 mL  15 mL Mouth Rinse BID Alberteen Sam, MD   15 mL at 03/23/21 1404   Chlorhexidine Gluconate Cloth 2 % PADS 6 each  6 each Topical Daily Erin Fulling, MD   6 each at 03/23/21 1405   cholecalciferol (VITAMIN D3) tablet 1,000 Units  1,000 Units Oral Daily [name], MD   1,000 Units at 03/23/21 1405   docusate sodium (COLACE) capsule 100 mg  100 mg Oral BID PRN Judithe Modest, NP       enoxaparin (LOVENOX) injection 40 mg  40 mg Subcutaneous Q24H Bari Mantis A, RPH   40 mg at 03/23/21 0505   feeding supplement (NEPRO CARB STEADY) liquid 237 mL  237 mL Oral BID BM Lynn Ito, MD   237 mL at 03/21/21 1304   feeding supplement (OSMOLITE 1.2 CAL) liquid 1,000  mL  1,000 mL Per Tube Continuous [name], MD   Stopped at 03/23/21 0055   free water 60 mL  60 mL Per Tube Q4H Lynn Ito, MD   60 mL at 03/23/21 1411   glimepiride (AMARYL) tablet 2 mg  2 mg Oral Daily [name], MD   2 mg at 03/21/21 1004   insulin aspart (novoLOG) injection 0-9 Units  0-9 Units Subcutaneous Q4H Alford Highland, MD   1 Units at 03/23/21 0115   MEDLINE mouth rinse  15 mL Mouth Rinse q12n4p Danford, Earl Lites, MD   15 mL at 03/23/21 1200   metoprolol tartrate (LOPRESSOR) 25 mg/10 mL oral suspension 12.5 mg  12.5 mg Per Tube BID Alford Highland, MD   12.5 mg at 03/22/21 2118   ondansetron (ZOFRAN) injection 4 mg  4 mg Intravenous Q6H PRN Judithe Modest, NP       polyethylene glycol (MIRALAX / GLYCOLAX) packet 17 g  17 g Oral Daily [name], MD   17 g at 03/23/21 1404   pravastatin  (PRAVACHOL) tablet 40 mg  40 mg Oral q1800 [name], MD   40 mg at 03/22/21 1723   senna-docusate (Senokot-S) tablet 1 tablet  1 tablet Oral BID [name], MD   1 tablet at 03/23/21 1405   thiothixene (NAVANE) capsule 2 mg  2 mg Per Tube BID Danean Marner, Jackquline Denmark, MD   2 mg at 03/23/21 1406    Musculoskeletal: Strength & Muscle Tone: decreased Gait & Station: unsteady Patient leans: N/A            Psychiatric Specialty Exam:  Presentation  General Appearance:  No data recorded Eye Contact: No data recorded Speech: No data recorded Speech Volume: No data recorded Handedness: No data recorded  Mood and Affect  Mood: No data recorded Affect: No data recorded  Thought Process  Thought Processes: No data recorded Descriptions of Associations:No data recorded Orientation:No data recorded Thought Content:No data recorded History of Schizophrenia/Schizoaffective disorder:No data recorded Duration of Psychotic Symptoms:No data recorded Hallucinations:No data recorded Ideas of Reference:No data recorded Suicidal Thoughts:No data recorded Homicidal Thoughts:No data recorded  Sensorium  Memory: No data recorded Judgment: No data recorded Insight: No data recorded  Executive Functions  Concentration: No data recorded Attention Span: No data recorded Recall: No data recorded Fund of Knowledge: No data recorded Language: No data recorded  Psychomotor Activity  Psychomotor Activity: No data recorded  Assets  Assets: No data recorded  Sleep  Sleep: No data recorded  Physical Exam: Physical Exam Vitals and nursing note reviewed.  Constitutional:      Appearance: Normal appearance.  HENT:     Head: Normocephalic and atraumatic.     Mouth/Throat:     Pharynx: Oropharynx is clear.  Eyes:     Pupils: Pupils are equal, round, and reactive to light.  Cardiovascular:     Rate and Rhythm: Normal rate and regular rhythm.  Pulmonary:     Effort:  Pulmonary effort is normal.     Breath sounds: Normal breath sounds.  Abdominal:     General: Abdomen is flat.     Palpations: Abdomen is soft.  Musculoskeletal:        General: Normal range of motion.  Skin:    General: Skin is warm and dry.  Neurological:     General: No focal deficit present.     Mental Status: She is alert. Mental status is at baseline.  Psychiatric:  Mood and Affect: Mood normal.        Thought Content: Thought content normal.   Review of Systems  Unable to perform ROS: Psychiatric disorder  Blood pressure 118/62, pulse 69, temperature 98 F (36.7 C), resp. rate 16, height 5\' 2"  (1.575 m), weight 47.5 kg, SpO2 96 %. Body mass index is 19.15 kg/m.  Treatment Plan Summary: Plan we will be doing ECT tomorrow but after that if she does not seem to be improving much more we may be at the point of maximum benefit.  Will reassess outpatient psychiatric medicine at that point.  Disposition: Supportive therapy provided about ongoing stressors. See note  , MD 03/23/2021 5:36 PM

## 2021-03-23 NOTE — Plan of Care (Addendum)
PMT note:  PMT was consulted previously during this hospitalization for GOC. Last discussion with son back in September, he stated he felt he could never make a decision to withhold any care that would prolong life, and that God would make those decisions through his sovereignty. He wanted full code/full scope treatment. PMT has peripherally shadowed the chart to see if patient would regain ability to participate in a GOC conversation herself, as well as to look for decline in order to offer support. Per notes patient has been receiving ECT treatments and appears to be making improvement. PMT will formally sign off at this time.  Please reconsult PMT if needed.

## 2021-03-23 NOTE — Assessment & Plan Note (Signed)
Resolved

## 2021-03-23 NOTE — Assessment & Plan Note (Addendum)
resolved 

## 2021-03-23 NOTE — Assessment & Plan Note (Signed)
--   Secondary to aspiration pneumonia, resolved

## 2021-03-23 NOTE — Progress Notes (Signed)
Hypoglycemic Event  CBG: 62  Treatment: D50 25 mL (12.5 gm)  Symptoms: None  Follow-up CBG: Time:0545 CBG Result:134  Possible Reasons for Event: Inadequate meal intake  Comments/MD notified:No    Erica Mueller D Erica Mueller

## 2021-03-23 NOTE — Assessment & Plan Note (Addendum)
--   With catatonic state earlier which has resolved with ECT. Remains awake, alert.   -- Per Dr. Toni Amend, no further ECT planned, he recommended outpatient follow-up in 2 weeks, continue Navane.

## 2021-03-23 NOTE — Assessment & Plan Note (Addendum)
--   Stable, continue Amaryl, sliding scale insulin.

## 2021-03-23 NOTE — Progress Notes (Signed)
Physical Therapy Treatment Patient Details Name: Erica Mueller MRN: [id number] DOB: April 28, 1951 Today's Date: 03/23/2021   History of Present Illness Pt is a 70 y/o F with PMH:  schizophrenia, HTN, GERD, type 2 diabetes mellitus,  depression and CAD. Pt presented on 02/07/21 d/t severe sepsis 2/2 COVID-19 infection. PEG tube placed 9/16 and tube changed 9/19. Pt has been catatonic. Phsychiatry started pt back on her baseline psych meds as well as ECT. Pt has become more interactive.    PT Comments    Pt asleep, awakens to gentle tactile and verbal cues. Pt demonstrated much improvement in conversation this session. Pt was able to reiterate PT name when asked and stated  "I would like to watch a story," when asked what  she wanted to watch on TV. Overall pt is able to follow one step commands with increased time for processing and tactile cues for task initiation. Today's session focused on taking steps to walk MAX-A + 2 HHA but pt unable to move LE without physical assist and significant support. Pt does show progress with bed mobility level of assistance requiring MIN A for task initiation w/ HOB elevated. Discharge recommendations remain SNF. Skilled PT intervention is indicated to address deficits in function, mobility, and to return to PLOF as able.      Recommendations for follow up therapy are one component of a multi-disciplinary discharge planning process, led by the attending physician.  Recommendations may be updated based on patient status, additional functional criteria and insurance authorization.  Follow Up Recommendations  SNF;Supervision for mobility/OOB;Supervision/Assistance - 24 hour     Equipment Recommendations  None recommended by PT    Recommendations for Other Services       Precautions / Restrictions Precautions Precautions: Fall Precaution Comments: G-tube Restrictions Weight Bearing Restrictions: No     Mobility  Bed Mobility Overal bed mobility: Needs  Assistance Bed Mobility: Supine to Sit     Supine to sit: Min assist;HOB elevated     General bed mobility comments: MIN A to initiate movement to [facility] but w/ PT sitting at EOB pt is able to position herself next to PT w/o physical assist    Transfers Overall transfer level: Needs assistance Equipment used: 2 person hand held assist   Sit to Stand: Max assist;+2 physical assistance;From elevated surface Stand pivot transfers: Max assist       General transfer comment: Knee buckle and tactile cues for feet positioning and tactile and physical assist for full upright standing.  Ambulation/Gait                 Stairs             Wheelchair Mobility    Modified Rankin (Stroke Patients Only)       Balance Overall balance assessment: Needs assistance Sitting-balance support: Feet supported;Single extremity supported Sitting balance-Leahy Scale: Fair Sitting balance - Comments: improvement in sitting balance requiring no physical assist and able to lift UE off bed without LOB; Postural control: Posterior lean Standing balance support: Bilateral upper extremity supported Standing balance-Leahy Scale: Zero Standing balance comment: MAX A for static standing w/ + 2 HHA                            Cognition Arousal/Alertness: Awake/alert Behavior During Therapy: WFL for tasks assessed/performed Overall Cognitive Status: History of cognitive impairments - at baseline Area of Impairment: Orientation;Memory;Attention;Safety/judgement;Awareness;Problem solving;Following commands  Orientation Level: Disoriented to;Place;Time;Situation;Person   Memory: Decreased short-term memory Following Commands: Follows one step commands with increased time;Follows one step commands inconsistently Safety/Judgement: Decreased awareness of safety;Decreased awareness of deficits   Problem Solving: Difficulty sequencing;Decreased initiation;Requires  verbal cues;Requires tactile cues General Comments: More conversive and interactive this session both verbally and physically. Pt demonstrates increased energy this session.      Exercises Other Exercises Other Exercises: Pt incontient of urine & BM. PT provided clean up in recliner with MAX A for rolling    General Comments        Pertinent Vitals/Pain Pain Assessment: Faces Faces Pain Scale: No hurt Pain Intervention(s): Monitored during session    Home Living                      Prior Function            PT Goals (current goals can now be found in the care plan section) Progress towards PT goals: Progressing toward goals    Frequency    Min 2X/week      PT Plan Current plan remains appropriate    Co-evaluation              AM-PAC PT "6 Clicks" Mobility   Outcome Measure  Help needed turning from your back to your side while in a flat bed without using bedrails?: A Little Help needed moving from lying on your back to sitting on the side of a flat bed without using bedrails?: A Little Help needed moving to and from a bed to a chair (including a wheelchair)?: A Lot Help needed standing up from a chair using your arms (e.g., wheelchair or bedside chair)?: Total Help needed to walk in hospital room?: Total Help needed climbing 3-5 steps with a railing? : Total 6 Click Score: 11    End of Session Equipment Utilized During Treatment: Gait belt Activity Tolerance: Patient tolerated treatment well Patient left: in chair;with call bell/phone within reach;with chair alarm set;Other (comment) (Mitts donned, fall mat in place) Nurse Communication: Mobility status PT Visit Diagnosis: Unsteadiness on feet (R26.81);Other abnormalities of gait and mobility (R26.89);Muscle weakness (generalized) (M62.81);Difficulty in walking, not elsewhere classified (R26.2)     Time: 3428-7681 PT Time Calculation (min) (ACUTE ONLY): 38 min  Charges:                         [employer], SPT

## 2021-03-23 NOTE — Progress Notes (Signed)
  Progress Note    Erica Mueller   IRS:[id number]  DOB: 07-Apr-1951  DOA: 02/07/2021     44 Date of Service: 03/23/2021   70 year old woman PMH schizophrenia admitted August 30 for severe sepsis secondary to aspiration pneumonia, hospitalization prolonged with poor recovery of mental status, subsequently a PEG tube was placed.  Seen by psychiatry and started on medications but remained catatonic.  Started on ECT.  Hospital Problems * Sepsis [facility]) -- Secondary to aspiration pneumonia, resolved  Ankle wound --wound care per WOC, present on admission  COVID-19 virus infection -- Resolved  Schizophrenia (HCC) -- more alert and talkative today. Continue ECT and medications per psychiatry  Type 2 diabetes mellitus with hyperglycemia, without long-term current use of insulin (HCC) -- Stable.  Continue insulin and oral agent.  Continue to hold metformin.  Subjective:  Feels ok today, answers some simple questions  Objective Vitals:   03/22/21 2317 03/23/21 0443 03/23/21 0736 03/23/21 1137  BP: (!) 149/53 (!) 132/51 125/68 123/78  Pulse: 70 63 79 73  Resp: 14 14 16 16   Temp: 98.8 F (37.1 C) 98.9 F (37.2 C) (!) 97.4 F (36.3 C) 97.6 F (36.4 C)  TempSrc:      SpO2: 100% 98% 93% 100%  Weight:      Height:       Vital signs were reviewed and unremarkable.  Exam Physical Exam Vitals reviewed.  Constitutional:      General: She is not in acute distress.    Appearance: She is not ill-appearing.     Comments: More talkative today  Cardiovascular:     Rate and Rhythm: Normal rate and regular rhythm.     Heart sounds: No murmur heard. Pulmonary:     Effort: Pulmonary effort is normal. No respiratory distress.     Breath sounds: No wheezing or rales.  Psychiatric:     Comments: Follows some simple commands, speech clear    Labs / Other Information My review of labs, imaging, notes and other tests is significant for    CBG low 64   Family Communication:  none Disposition Plan: Status is: Inpatient  Remains inpatient appropriate because: ongoing ECT  Time spent: 20 minutes [facility] 03/23/2021, 3:08 PM

## 2021-03-23 NOTE — Progress Notes (Signed)
Occupational Therapy Treatment Patient Details Name: Erica Mueller MRN: [id number] DOB: December 23, 1950 Today's Date: 03/23/2021   History of present illness Pt is a 70 y/o F with PMH:  schizophrenia, HTN, GERD, type 2 diabetes mellitus,  depression and CAD. Pt presented on 02/07/21 d/t severe sepsis 2/2 COVID-19 infection. PEG tube placed 9/16 and tube changed 9/19. Pt has been catatonic. Phsychiatry started pt back on her baseline psych meds as well as ECT. Pt has become more interactive.   OT comments  Pt seen for OT tx this date to f/u re: safety with ADLs/ADL mobility. OT engages pt in oral care with MIN A and cues for sequencing. Pt demos better participation and sequencing this date. She is generally more concessive and more able to follow commands. OT engages pt in 2 STS trials from recliner with moderate verbal/tactile cues to push to stand. Requires MAX/TOTAL A to achieve stand with hips still noted to be slightly in flexion and heavy posterior lean. Pt left in chair with B LE elevated, chair alarm set, and all needs met and in reach. Will continue to follow acutely.    Recommendations for follow up therapy are one component of a multi-disciplinary discharge planning process, led by the attending physician.  Recommendations may be updated based on patient status, additional functional criteria and insurance authorization.    Follow Up Recommendations  SNF    Equipment Recommendations  Other (comment) (defer)    Recommendations for Other Services      Precautions / Restrictions Precautions Precautions: Fall Precaution Comments: G-tube Restrictions Weight Bearing Restrictions: No       Mobility Bed Mobility               General bed mobility comments: up to chair pre/post OT    Transfers Overall transfer level: Needs assistance Equipment used: Rolling walker (2 wheeled) Transfers: Sit to/from Stand Sit to Stand: Max assist;Total assist         General transfer  comment: 2 trials with MAX A/TOTAL A to CTS from chair with MOD tactile/verbal  cues for hand placement and significant posterior support as she is noted to brace LEs on chair.    Balance         Postural control: Posterior lean Standing balance support: Bilateral upper extremity supported Standing balance-Leahy Scale: Zero Standing balance comment: MAX A to sustain static stand for <5 seconds with hips slightly flexed entire stand as well despite posterior support and cues                           ADL either performed or assessed with clinical judgement   ADL Overall ADL's : Needs assistance/impaired     Grooming: Oral care;Sitting;Cueing for sequencing;Minimal assistance Grooming Details (indicate cue type and reason): improved task sequencing demonstrated with oral care this date with only minimal cueing             Lower Body Dressing: Minimal assistance;Sitting/lateral leans Lower Body Dressing Details (indicate cue type and reason): to don socks/shoes                     Vision       Perception     Praxis      Cognition Arousal/Alertness: Awake/alert Behavior During Therapy: WFL for tasks assessed/performed Overall Cognitive Status: History of cognitive impairments - at baseline Area of Impairment: Orientation;Memory;Safety/judgement;Problem solving;Following commands  Orientation Level: Disoriented to;Place;Time;Situation;Person   Memory: Decreased short-term memory Following Commands: Follows one step commands with increased time;Follows one step commands inconsistently Safety/Judgement: Decreased awareness of safety;Decreased awareness of deficits   Problem Solving: Difficulty sequencing;Decreased initiation;Requires verbal cues;Requires tactile cues General Comments: more alert and conversive throughout session, still only oriented to self        Exercises     Shoulder Instructions       General Comments       Pertinent Vitals/ Pain       Pain Assessment: No/denies pain  Home Living                                          Prior Functioning/Environment              Frequency  Min 1X/week        Progress Toward Goals  OT Goals(current goals can now be found in the care plan section)  Progress towards OT goals: Progressing toward goals  Acute Rehab OT Goals Patient Stated Goal: none stated OT Goal Formulation: Patient unable to participate in goal setting Time For Goal Achievement: 03/28/21 Potential to Achieve Goals: [place] Discharge plan remains appropriate    Co-evaluation                 AM-PAC OT "6 Clicks" Daily Activity     Outcome Measure   Help from another person eating meals?: A Little Help from another person taking care of personal grooming?: A Little Help from another person toileting, which includes using toliet, bedpan, or urinal?: A Lot Help from another person bathing (including washing, rinsing, drying)?: A Lot Help from another person to put on and taking off regular upper body clothing?: A Little Help from another person to put on and taking off regular lower body clothing?: A Little 6 Click Score: 16    End of Session Equipment Utilized During Treatment: Gait belt;Rolling walker  OT Visit Diagnosis: Unsteadiness on feet (R26.81);Other symptoms and signs involving cognitive function   Activity Tolerance Patient tolerated treatment well   Patient Left with call bell/phone within reach;in chair;with chair alarm set;Other (comment) (fall mat in place)   Nurse Communication Mobility status;Other (comment) (notified CNA that pt scratched her skin in an attempt to get her mitts off and had successfully taken 1 off by time OT presented. Pt's cut on L hand with dried blood at this point and appears to be clotting appropriately. request to leave mitts off)        Time: 1040-1056 OT Time Calculation (min): 16  min  Charges: OT General Charges $OT Visit: 1 Visit OT Treatments $Self Care/Home Management : 8-22 mins  Gerrianne Scale, MS, OTR/L ascom [phone] 03/23/21, 5:31 PM

## 2021-03-23 NOTE — Plan of Care (Signed)

## 2021-03-24 ENCOUNTER — Encounter: Payer: Self-pay | Admitting: Registered Nurse

## 2021-03-24 ENCOUNTER — Inpatient Hospital Stay: Admit: 2021-03-24 | Discharge: 2021-03-24 | Disposition: A | Discharge status: Home / Self Care | Payer: Medicare Other

## 2021-03-24 DIAGNOSIS — F202 Catatonic schizophrenia: Secondary | ICD-10-CM | POA: Diagnosis not present

## 2021-03-24 DIAGNOSIS — E1165 Type 2 diabetes mellitus with hyperglycemia: Secondary | ICD-10-CM | POA: Diagnosis not present

## 2021-03-24 LAB — GLUCOSE, CAPILLARY
Glucose-Capillary: 130 mg/dL — ABNORMAL HIGH (ref 70–99)
Glucose-Capillary: 126 mg/dL — ABNORMAL HIGH (ref 70–99)
Glucose-Capillary: 183 mg/dL — ABNORMAL HIGH (ref 70–99)
Glucose-Capillary: 190 mg/dL — ABNORMAL HIGH (ref 70–99)
Glucose-Capillary: 164 mg/dL — ABNORMAL HIGH (ref 70–99)
Glucose-Capillary: 225 mg/dL — ABNORMAL HIGH (ref 70–99)
Glucose-Capillary: 146 mg/dL — ABNORMAL HIGH (ref 70–99)

## 2021-03-24 NOTE — Progress Notes (Signed)
  Progress Note    Erica Mueller   MPN:[id number]  DOB: March 04, 1951  DOA: 02/07/2021     45 Date of Service: 03/24/2021   Clinical Course 70 year old woman PMH schizophrenia admitted August 30 for severe sepsis secondary to aspiration pneumonia, hospitalization prolonged with poor recovery of mental status, subsequently a PEG tube was placed.  Seen by psychiatry and started on medications but remained catatonic.  Started on ECT.  Assessment and Plan * Sepsis (HCC) -- Secondary to aspiration pneumonia, resolved  Ankle wound --wound care per WOC, present on admission  COVID-19 virus infection -- Resolved  Schizophrenia (HCC) -- Continues to be more alert and talkative.  Next ECT 10/17.  To long-term care when cleared by psychiatry  Type 2 diabetes mellitus with hyperglycemia, without long-term current use of insulin (HCC) -- [place] episode of hypoglycemia.  Scheduled insulin stopped.  Continue Amaryl, sliding scale if needed.  Can resume metformin on discharge.   Subjective:  Feels ok  Objective Vitals:   03/24/21 0416 03/24/21 0734 03/24/21 1132 03/24/21 1515  BP: (!) 111/93 (!) 146/56 102/83 (!) 136/53  Pulse: 83 80 71 80  Resp: 18 16 16 15   Temp: 97.8 F (36.6 C) 97.6 F (36.4 C) (!) 97.2 F (36.2 C) 98.6 F (37 C)  TempSrc:      SpO2: 100% 100% 98% 100%  Weight:      Height:       47.5 kg  Vital signs were reviewed and unremarkable.   Exam Physical Exam Vitals reviewed.  Constitutional:      General: She is not in acute distress.    Appearance: She is not ill-appearing or toxic-appearing.  Cardiovascular:     Rate and Rhythm: Normal rate and regular rhythm.     Heart sounds: No murmur heard. Pulmonary:     Effort: Pulmonary effort is normal. No respiratory distress.     Breath sounds: No wheezing or rales.  Neurological:     Mental Status: She is alert.  Psychiatric:        Mood and Affect: Mood normal.        Behavior: Behavior normal.    Labs  / Other Information My review of labs, imaging, notes and other tests is significant for     CBG stable  Disposition Plan: Status is: Inpatient  Remains inpatient appropriate because: ongoing ECT for decompensated schizophrenia  Time spent: 15 minutes [facility] 03/24/2021, 4:21 PM

## 2021-03-24 NOTE — Hospital Course (Addendum)
70 year old woman from [facility] PMH schizophrenia, admitted for severe sepsis thought initially secondary to UTI, but subsequently determined to be secondary to aspiration pneumonia.  Quickly transferred out of the ICU.  Early course complicated by UTI, acute kidney injury, all of which rapidly improved.  Toxic metabolic encephalopathy and failure to thrive persisted, resulting in the feeding tube placement secondary to cognitive impairment.  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 -- 10/18 discharge to SNF was planned, however PEG tube connector malfunctioned, after discussion with interventional radiology nursing, tube has been taped, tube feeds restarted and will monitor overnight.  If stable tomorrow without difficulty with PEG tube feeds, can discharge.  Consults Admitted by critical care Palliative medicine Psychiatry

## 2021-03-24 NOTE — Consult Note (Signed)
[facility] Face-to-Face Psychiatry Consult   Reason for Consult: Consult follow-up 70 year old woman with schizophrenia who had been getting ECT Referring Physician: Irene Limbo Patient Identification: Erica Mueller MRN:  [id number] Principal Diagnosis: Sepsis [facility]) Diagnosis:  Principal Problem:   Sepsis (HCC) Active Problems:   Type 2 diabetes mellitus with hyperglycemia, without long-term current use of insulin (HCC)   Essential hypertension   Schizophrenia (HCC)   Pressure injury of skin   Protein-calorie malnutrition, severe   Failure to thrive in adult   Hypernatremia   Hypokalemia   Catatonia   AKI (acute kidney injury) (HCC)   COVID-19 virus infection   PEG tube malfunction (HCC)   Hypophosphatemia   Ankle wound   Total Time spent with patient: 20 minutes  Subjective:   Erica Mueller is a 70 y.o. female patient admitted with "I am feeling good"..  HPI: Patient seen today.  She had been scheduled for ECT but because of my mistake she was not kept n.p.o. at the correct time and therefore we could not do ECT.*This afternoon she is awake and alert.  Actually more interactive than before.  Smiling.  Asking some more meaningful questions.  Past Psychiatric History: History of schizophrenia  Risk to Self:   Risk to Others:   Prior Inpatient Therapy:   Prior Outpatient Therapy:    Past Medical History:  Past Medical History:  Diagnosis Date   Coronary artery disease    Depression    Diabetes mellitus without complication (HCC)    Elevated lipids    GERD (gastroesophageal reflux disease)    Hypertension    Schizophrenia (HCC)    Syphilis (acquired)     Past Surgical History:  Procedure Laterality Date   COLONOSCOPY     COLONOSCOPY WITH PROPOFOL N/A 09/01/2015   Procedure: COLONOSCOPY WITH PROPOFOL;  Surgeon: Wallace Cullens, MD;  Location: [facility] ENDOSCOPY;  Service: Gastroenterology;  Laterality: N/A;   IR FLUORO RM 30-60 MIN  02/23/2021   IR GASTROSTOMY TUBE MOD SED  02/24/2021    IR REPLC GASTRO/COLONIC TUBE PERCUT W/FLUORO  02/27/2021   TUBAL LIGATION     wisdom teeth removal     Family History:  Family History  Problem Relation Age of Onset   Cancer Mother    Alcoholism Mother    Bone cancer Father    Heart disease Father    Diabetes Maternal Grandmother    Suicidality Paternal Uncle    Breast cancer Neg Hx    Family Psychiatric  History: None except for suicidality on father's side of the family Social History:  Social History   Substance and Sexual Activity  Alcohol Use Yes   Alcohol/week: 3.0 standard drinks   Types: 2 Cans of beer, 1 Shots of liquor per week     Social History   Substance and Sexual Activity  Drug Use Not Currently   Types: Marijuana   Comment: PAST     Social History   Socioeconomic History   Marital status: Divorced    Spouse name: Not on file   Number of children: Not on file   Years of education: Not on file   Highest education level: Not on file  Occupational History   Not on file  Tobacco Use   Smoking status: Every Day    Packs/day: 1.00    Years: 48.00    Pack years: 48.00    Types: Cigarettes    Start date: 12/20/1969   Smokeless tobacco: Never  Vaping Use  Vaping Use: Never used  Substance and Sexual Activity   Alcohol use: Yes    Alcohol/week: 3.0 standard drinks    Types: 2 Cans of beer, 1 Shots of liquor per week   Drug use: Not Currently    Types: Marijuana    Comment: PAST    Sexual activity: Not Currently    Birth control/protection: None  Other Topics Concern   Not on file  Social History Narrative   Not on file   Social Determinants of Health   Financial Resource Strain: Not on file  Food Insecurity: Not on file  Transportation Needs: Not on file  Physical Activity: Not on file  Stress: Not on file  Social Connections: Not on file   Additional Social History:    Allergies:  No Known Allergies  Labs:  Results for orders placed or performed during the hospital encounter of  02/07/21 (from the past 48 hour(s))  Glucose, capillary     Status: None   Collection Time: 03/22/21  8:29 PM  Result Value Ref Range   Glucose-Capillary 85 70 - 99 mg/dL    Comment: Glucose reference range applies only to samples taken after fasting for at least 8 hours.   Comment 1 Notify RN   Glucose, capillary     Status: Abnormal   Collection Time: 03/22/21 11:20 PM  Result Value Ref Range   Glucose-Capillary 140 (H) 70 - 99 mg/dL    Comment: Glucose reference range applies only to samples taken after fasting for at least 8 hours.   Comment 1 Notify RN   Glucose, capillary     Status: Abnormal   Collection Time: 03/23/21  1:02 AM  Result Value Ref Range   Glucose-Capillary 139 (H) 70 - 99 mg/dL    Comment: Glucose reference range applies only to samples taken after fasting for at least 8 hours.  Glucose, capillary     Status: Abnormal   Collection Time: 03/23/21  4:45 AM  Result Value Ref Range   Glucose-Capillary 64 (L) 70 - 99 mg/dL    Comment: Glucose reference range applies only to samples taken after fasting for at least 8 hours.   Comment 1 Notify RN   Glucose, capillary     Status: Abnormal   Collection Time: 03/23/21  5:53 AM  Result Value Ref Range   Glucose-Capillary 134 (H) 70 - 99 mg/dL    Comment: Glucose reference range applies only to samples taken after fasting for at least 8 hours.  Glucose, capillary     Status: None   Collection Time: 03/23/21  8:16 AM  Result Value Ref Range   Glucose-Capillary 92 70 - 99 mg/dL    Comment: Glucose reference range applies only to samples taken after fasting for at least 8 hours.  Glucose, capillary     Status: Abnormal   Collection Time: 03/23/21 12:28 PM  Result Value Ref Range   Glucose-Capillary 130 (H) 70 - 99 mg/dL    Comment: Glucose reference range applies only to samples taken after fasting for at least 8 hours.  Glucose, capillary     Status: Abnormal   Collection Time: 03/23/21  4:31 PM  Result Value Ref  Range   Glucose-Capillary 126 (H) 70 - 99 mg/dL    Comment: Glucose reference range applies only to samples taken after fasting for at least 8 hours.  Glucose, capillary     Status: Abnormal   Collection Time: 03/23/21  8:17 PM  Result Value Ref Range  Glucose-Capillary 183 (H) 70 - 99 mg/dL    Comment: Glucose reference range applies only to samples taken after fasting for at least 8 hours.   Comment 1 Notify RN    Comment 2 Document in Chart   Glucose, capillary     Status: Abnormal   Collection Time: 03/23/21  9:28 PM  Result Value Ref Range   Glucose-Capillary 190 (H) 70 - 99 mg/dL    Comment: Glucose reference range applies only to samples taken after fasting for at least 8 hours.  Glucose, capillary     Status: Abnormal   Collection Time: 03/24/21  4:12 AM  Result Value Ref Range   Glucose-Capillary 164 (H) 70 - 99 mg/dL    Comment: Glucose reference range applies only to samples taken after fasting for at least 8 hours.   Comment 1 Notify RN    Comment 2 Document in Chart   Glucose, capillary     Status: Abnormal   Collection Time: 03/24/21  8:00 AM  Result Value Ref Range   Glucose-Capillary 225 (H) 70 - 99 mg/dL    Comment: Glucose reference range applies only to samples taken after fasting for at least 8 hours.  Glucose, capillary     Status: Abnormal   Collection Time: 03/24/21 11:39 AM  Result Value Ref Range   Glucose-Capillary 146 (H) 70 - 99 mg/dL    Comment: Glucose reference range applies only to samples taken after fasting for at least 8 hours.    Current Facility-Administered Medications  Medication Dose Route Frequency Provider Last Rate Last Admin   acetaminophen (TYLENOL) tablet 650 mg  650 mg Oral Q4H PRN Harlon Ditty D, NP       ascorbic acid (VITAMIN C) tablet 500 mg  500 mg Per Tube Daily Charise Killian, MD   500 mg at 03/23/21 1405   chlorhexidine (PERIDEX) 0.12 % solution 15 mL  15 mL Mouth Rinse BID Alberteen Sam, MD   15 mL at  03/23/21 2317   Chlorhexidine Gluconate Cloth 2 % PADS 6 each  6 each Topical Daily Erin Fulling, MD   6 each at 03/24/21 0902   cholecalciferol (VITAMIN D3) tablet 1,000 Units  1,000 Units Oral Daily [name], MD   1,000 Units at 03/23/21 1405   docusate sodium (COLACE) capsule 100 mg  100 mg Oral BID PRN Judithe Modest, NP       enoxaparin (LOVENOX) injection 40 mg  40 mg Subcutaneous Q24H Bari Mantis A, RPH   40 mg at 03/24/21 6734   feeding supplement (NEPRO CARB STEADY) liquid 237 mL  237 mL Oral BID BM Lynn Ito, MD   237 mL at 03/24/21 1358   feeding supplement (OSMOLITE 1.2 CAL) liquid 1,000 mL  1,000 mL Per Tube Continuous [name], MD 60 mL/hr at 03/24/21 1200 Infusion Verify at 03/24/21 1200   free water 60 mL  60 mL Per Tube Q4H Lynn Ito, MD   60 mL at 03/24/21 1551   glimepiride (AMARYL) tablet 2 mg  2 mg Oral Daily [name], MD   2 mg at 03/21/21 1004   insulin aspart (novoLOG) injection 0-9 Units  0-9 Units Subcutaneous Q4H Alford Highland, MD   2 Units at 03/24/21 1541   MEDLINE mouth rinse  15 mL Mouth Rinse q12n4p Danford, Earl Lites, MD   15 mL at 03/24/21 1551   metoprolol tartrate (LOPRESSOR) tablet 12.5 mg  12.5 mg Oral BID Standley Brooking, MD  12.5 mg at 03/23/21 2316   ondansetron (ZOFRAN) injection 4 mg  4 mg Intravenous Q6H PRN Judithe Modest, NP       polyethylene glycol (MIRALAX / GLYCOLAX) packet 17 g  17 g Oral Daily [name], MD   17 g at 03/23/21 1404   pravastatin (PRAVACHOL) tablet 40 mg  40 mg Oral q1800 [name], MD   40 mg at 03/23/21 1800   senna-docusate (Senokot-S) tablet 1 tablet  1 tablet Oral BID [name], MD   1 tablet at 03/23/21 1405   thiothixene (NAVANE) capsule 2 mg  2 mg Per Tube BID Darcell Yacoub, Jackquline Denmark, MD   2 mg at 03/23/21 2316    Musculoskeletal: Strength & Muscle Tone: decreased Gait & Station: unable to stand Patient leans: N/A            Psychiatric Specialty Exam:  Presentation   General Appearance:  No data recorded Eye Contact: No data recorded Speech: No data recorded Speech Volume: No data recorded Handedness: No data recorded  Mood and Affect  Mood: No data recorded Affect: No data recorded  Thought Process  Thought Processes: No data recorded Descriptions of Associations:No data recorded Orientation:No data recorded Thought Content:No data recorded History of Schizophrenia/Schizoaffective disorder:No data recorded Duration of Psychotic Symptoms:No data recorded Hallucinations:No data recorded Ideas of Reference:No data recorded Suicidal Thoughts:No data recorded Homicidal Thoughts:No data recorded  Sensorium  Memory: No data recorded Judgment: No data recorded Insight: No data recorded  Executive Functions  Concentration: No data recorded Attention Span: No data recorded Recall: No data recorded Fund of Knowledge: No data recorded Language: No data recorded  Psychomotor Activity  Psychomotor Activity: No data recorded  Assets  Assets: No data recorded  Sleep  Sleep: No data recorded  Physical Exam: Physical Exam Vitals and nursing note reviewed.  Constitutional:      Appearance: Normal appearance.  HENT:     Head: Normocephalic and atraumatic.     Mouth/Throat:     Pharynx: Oropharynx is clear.  Eyes:     Pupils: Pupils are equal, round, and reactive to light.  Cardiovascular:     Rate and Rhythm: Normal rate and regular rhythm.  Pulmonary:     Effort: Pulmonary effort is normal.     Breath sounds: Normal breath sounds.  Abdominal:     General: Abdomen is flat.     Palpations: Abdomen is soft.  Musculoskeletal:        General: Normal range of motion.  Skin:    General: Skin is warm and dry.  Neurological:     General: No focal deficit present.     Mental Status: She is alert. Mental status is at baseline.  Psychiatric:        Attention and Perception: Attention normal.        Mood and Affect:  Mood normal. Affect is blunt.        Speech: Speech is delayed.        Behavior: Behavior is slowed.        Thought Content: Thought content normal.   Review of Systems  Constitutional: Negative.   HENT: Negative.    Eyes: Negative.   Respiratory: Negative.    Cardiovascular: Negative.   Gastrointestinal: Negative.   Musculoskeletal: Negative.   Skin: Negative.   Neurological: Negative.   Psychiatric/Behavioral: Negative.    Blood pressure (!) 136/53, pulse 80, temperature 98.6 F (37 C), resp. rate 15, height 5\' 2"  (1.575 m), weight 47.5  kg, SpO2 100 %. Body mass index is 19.15 kg/m.  Treatment Plan Summary: Plan patient does not appear catatonic and is no longer necessarily appearing depressed and her mental status is improving.  I will follow up over the weekend.  I am not sure whether we need to restart ECT and continue on Monday or not.  Reviewed plan with patient.  Disposition: No evidence of imminent risk to self or others at present.   Patient does not meet criteria for psychiatric inpatient admission. Supportive therapy provided about ongoing stressors.  Mordecai Rasmussen, MD 03/24/2021 4:54 PM

## 2021-03-25 ENCOUNTER — Inpatient Hospital Stay: Payer: Medicare Other

## 2021-03-25 DIAGNOSIS — F061 Catatonic disorder due to known physiological condition: Secondary | ICD-10-CM | POA: Diagnosis not present

## 2021-03-25 DIAGNOSIS — F202 Catatonic schizophrenia: Secondary | ICD-10-CM | POA: Diagnosis not present

## 2021-03-25 DIAGNOSIS — D696 Thrombocytopenia, unspecified: Secondary | ICD-10-CM

## 2021-03-25 DIAGNOSIS — R2231 Localized swelling, mass and lump, right upper limb: Secondary | ICD-10-CM | POA: Diagnosis not present

## 2021-03-25 DIAGNOSIS — J690 Pneumonitis due to inhalation of food and vomit: Secondary | ICD-10-CM

## 2021-03-25 DIAGNOSIS — J9601 Acute respiratory failure with hypoxia: Secondary | ICD-10-CM

## 2021-03-25 DIAGNOSIS — D638 Anemia in other chronic diseases classified elsewhere: Secondary | ICD-10-CM

## 2021-03-25 DIAGNOSIS — I5042 Chronic combined systolic (congestive) and diastolic (congestive) heart failure: Secondary | ICD-10-CM

## 2021-03-25 DIAGNOSIS — I700 Atherosclerosis of aorta: Secondary | ICD-10-CM

## 2021-03-25 DIAGNOSIS — J439 Emphysema, unspecified: Secondary | ICD-10-CM

## 2021-03-25 DIAGNOSIS — L723 Sebaceous cyst: Secondary | ICD-10-CM

## 2021-03-25 MED ORDER — LISINOPRIL 5 MG PO TABS
5.0000 mg | ORAL_TABLET | Freq: Every day | ORAL | Status: DC
  Administered 2021-03-26 – 2021-03-29 (×2): 5 mg
  Filled 2021-03-25 (×4): qty 1

## 2021-03-25 NOTE — Progress Notes (Signed)
Progress Note    Erica Mueller   OMV:[id number]  DOB: 1950-08-25  DOA: 02/07/2021     46 Date of Service: 03/25/2021   Clinical Course 70 year old woman from [facility] PMH schizophrenia, admitted for severe sepsis thought initially secondary to UTI, but subsequently determined to be secondary to aspiration pneumonia.  Quickly transferred out of the ICU.  Early course complicated by UTI, acute kidney injury, all of which rapidly improved.  Toxic metabolic encephalopathy and failure to thrive persisted, resulting in the feeding tube placement secondary to cognitive impairment.  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 --Continues with ECT, when complete, transferred to long-term care.  Assessment and Plan * Sepsis (HCC) -- Secondary to aspiration pneumonia, resolved  Schizophrenia (HCC) -- With catatonic state.  Continues to be more alert and talkative.  Next ECT 10/17.  To long-term care when cleared by psychiatry  Anemia of chronic disease -- Stable.  Chronic combined systolic and diastolic CHF (congestive heart failure) (HCC) -- New diagnosis this admission.  Appears well compensated.  Conservative management at this point given overall clinical status.  If patient continues to improve, outpatient follow-up with cardiology. -- Continue beta-blocker.  Start ACE inhibitor.  Appears euvolemic, no indication for diuretic presently.  Failure to thrive in adult -- Continue tube feeds.  Protein-calorie malnutrition, severe -- Continue tube feeds  Stage II pressure ulcer (HCC) -- Wound care  Type 2 diabetes mellitus with hyperglycemia, without long-term current use of insulin (HCC) -- Stable, continue Amaryl, sliding scale insulin.  Emphysema (HCC) -- Asymptomatic.  No hypoxia.  Monitor clinically.  Ankle wound --wound care per WOC, present on admission  Thrombocytopenia (HCC) --secondary  to sepsis, resolved  Acute respiratory failure with hypoxia (HCC) -- Resolved.  Secondary to aspiration pneumonia.  Nonspecific complex cystic structure superficial soft tissues right axilla. --Nonspecific complex cystic structure in the superficial soft tissues of the right axilla seen on upper extremity venous Doppler. "This may represent a hematoma, or possibly an abscess in the appropriate clinical setting. Pathologic lymphadenopathy is considered less likely given the absence of vascularity within the structure." -- We will repeat study to reassess  Aspiration pneumonia (HCC) -- Resolved  COVID-19 virus infection -- Resolved  AKI (acute kidney injury) (HCC) -- Resolved  Hypernatremia -- Resolved  Acute metabolic encephalopathy -- Improving with ECT.  Subjective:  Feels ok  Objective Vitals:   03/24/21 2024 03/25/21 0345 03/25/21 0736 03/25/21 1712  BP: (!) 146/61 (!) 139/54 (!) 146/53 (!) 128/49  Pulse: 87 83 79 78  Resp: 18 18 14 15   Temp: 97.8 F (36.6 C) 98.5 F (36.9 C) 98.3 F (36.8 C) 98.3 F (36.8 C)  TempSrc:      SpO2: 100% 100% 100% 100%  Weight:      Height:       47.5 kg  Vital signs were reviewed and unremarkable.  Exam Physical Exam Constitutional:      General: She is not in acute distress. Cardiovascular:     Rate and Rhythm: Normal rate and regular rhythm.     Heart sounds: No murmur heard. Pulmonary:     Effort: Pulmonary effort is normal. No respiratory distress.     Breath sounds: No wheezing or rales.  Neurological:     Mental Status: She is alert.  Psychiatric:     Comments: Awake, alert, responds to simple questions, follows simple commands.    Labs / Other Information  My review of labs, imaging, notes and other tests is significant for    CBG stable  Disposition Plan: Status is: Inpatient  Remains inpatient appropriate because: Catatonic state improving, ECT planned for 10/17.  Full code  Time spent: 35  minutes [facility] 03/25/2021, 5:41 PM

## 2021-03-25 NOTE — Assessment & Plan Note (Addendum)
Resolved

## 2021-03-25 NOTE — Assessment & Plan Note (Signed)
Resolved

## 2021-03-25 NOTE — Assessment & Plan Note (Signed)
--  secondary to sepsis, resolved 

## 2021-03-25 NOTE — Assessment & Plan Note (Addendum)
--   Continue tube feeds but see above -- Due to pt's current acute state and overall presentation w/ cognitive decline baseline, recommend continue w/ current diet plan of Primary PEG TFs for needs (as per Dietician) and Dysphagia diet of level 1(PUREE) w/ Nectar liquids for Pleasure; general aspiration precautions and support/Supervision at meals.  Pt can f/u w/ST services forDysphagia tx at next venue of care in hopes of continuing to safely upgrade heroral diet to least restrictive consistency -- this can be done most appropriately when pt has completed all ECT txs, and improved medically, and is back inher structured, knownsetting. NSG/CM updated

## 2021-03-25 NOTE — Assessment & Plan Note (Addendum)
--   Improved with ECT.  Further improvement will likely be quite slow.

## 2021-03-25 NOTE — Assessment & Plan Note (Addendum)
--  Nonspecific complex cystic structure in the superficial soft tissues of the right axilla likely benign cyst. Follow-up as outpatient.

## 2021-03-25 NOTE — Progress Notes (Signed)
CBG q 4 hr monitoring results this shift were 205, 127, and 182.

## 2021-03-25 NOTE — Assessment & Plan Note (Signed)
--   Asymptomatic.  No hypoxia.  Monitor clinically.

## 2021-03-25 NOTE — Assessment & Plan Note (Signed)
Stable

## 2021-03-25 NOTE — Assessment & Plan Note (Addendum)
--   New diagnosis this admission.  Appears well compensated.  Conservative management at this point given overall clinical status.  If patient continues to improve, outpatient follow-up with cardiology. -- Continue beta-blocker, ACE inhibitor.  Appears euvolemic, no indication for diuretic presently.

## 2021-03-25 NOTE — Assessment & Plan Note (Signed)
--   Resolved.  Secondary to aspiration pneumonia.

## 2021-03-25 NOTE — Assessment & Plan Note (Addendum)
--   Continue tube feeds.  May be able to wean off tube feeds if oral intake improves.

## 2021-03-25 NOTE — Consult Note (Signed)
[facility] Face-to-Face Psychiatry Consult   Reason for Consult: Follow-up consult 70 year old woman with schizophrenia receiving ECT Referring Physician: Irene Limbo Patient Identification: Erica Mueller MRN:  [id number] Principal Diagnosis: Sepsis [facility]) Diagnosis:  Principal Problem:   Sepsis (HCC) Active Problems:   Type 2 diabetes mellitus with hyperglycemia, without long-term current use of insulin (HCC)   Essential hypertension   Schizophrenia (HCC)   Pressure injury of skin   Protein-calorie malnutrition, severe   Failure to thrive in adult   Hypernatremia   Hypokalemia   Catatonia   AKI (acute kidney injury) (HCC)   COVID-19 virus infection   PEG tube malfunction (HCC)   Hypophosphatemia   Ankle wound   Total Time spent with patient: 15 minutes  Subjective:   Erica Mueller is a 70 y.o. female patient admitted with "I am doing okay".  HPI: Patient seen.  Patient is awake and alert watching television.  She engaged in a little conversation.  Not very much.  She was able to ask a couple questions about what ECT was.  Able to answer questions about her son not having come to visit.  Still not eating adequately.  Tube feeds still on.  Past Psychiatric History: History of schizophrenia with recent ECT 2 improve catatonia  Risk to Self:   Risk to Others:   Prior Inpatient Therapy:   Prior Outpatient Therapy:    Past Medical History:  Past Medical History:  Diagnosis Date   Coronary artery disease    Depression    Diabetes mellitus without complication (HCC)    Elevated lipids    GERD (gastroesophageal reflux disease)    Hypertension    Schizophrenia (HCC)    Syphilis (acquired)     Past Surgical History:  Procedure Laterality Date   COLONOSCOPY     COLONOSCOPY WITH PROPOFOL N/A 09/01/2015   Procedure: COLONOSCOPY WITH PROPOFOL;  Surgeon: Wallace Cullens, MD;  Location: [facility] ENDOSCOPY;  Service: Gastroenterology;  Laterality: N/A;   IR FLUORO RM 30-60 MIN  02/23/2021   IR  GASTROSTOMY TUBE MOD SED  02/24/2021   IR REPLC GASTRO/COLONIC TUBE PERCUT W/FLUORO  02/27/2021   TUBAL LIGATION     wisdom teeth removal     Family History:  Family History  Problem Relation Age of Onset   Cancer Mother    Alcoholism Mother    Bone cancer Father    Heart disease Father    Diabetes Maternal Grandmother    Suicidality Paternal Uncle    Breast cancer Neg Hx    Family Psychiatric  History: Suicidality in father's side of the family Social History:  Social History   Substance and Sexual Activity  Alcohol Use Yes   Alcohol/week: 3.0 standard drinks   Types: 2 Cans of beer, 1 Shots of liquor per week     Social History   Substance and Sexual Activity  Drug Use Not Currently   Types: Marijuana   Comment: PAST     Social History   Socioeconomic History   Marital status: Divorced    Spouse name: Not on file   Number of children: Not on file   Years of education: Not on file   Highest education level: Not on file  Occupational History   Not on file  Tobacco Use   Smoking status: Every Day    Packs/day: 1.00    Years: 48.00    Pack years: 48.00    Types: Cigarettes    Start date: 12/20/1969   Smokeless tobacco:  Never  Vaping Use   Vaping Use: Never used  Substance and Sexual Activity   Alcohol use: Yes    Alcohol/week: 3.0 standard drinks    Types: 2 Cans of beer, 1 Shots of liquor per week   Drug use: Not Currently    Types: Marijuana    Comment: PAST    Sexual activity: Not Currently    Birth control/protection: None  Other Topics Concern   Not on file  Social History Narrative   Not on file   Social Determinants of Health   Financial Resource Strain: Not on file  Food Insecurity: Not on file  Transportation Needs: Not on file  Physical Activity: Not on file  Stress: Not on file  Social Connections: Not on file   Additional Social History:    Allergies:  No Known Allergies  Labs:  Results for orders placed or performed during the  hospital encounter of 02/07/21 (from the past 48 hour(s))  Glucose, capillary     Status: Abnormal   Collection Time: 03/23/21  8:17 PM  Result Value Ref Range   Glucose-Capillary 183 (H) 70 - 99 mg/dL    Comment: Glucose reference range applies only to samples taken after fasting for at least 8 hours.   Comment 1 Notify RN    Comment 2 Document in Chart   Glucose, capillary     Status: Abnormal   Collection Time: 03/23/21  9:28 PM  Result Value Ref Range   Glucose-Capillary 190 (H) 70 - 99 mg/dL    Comment: Glucose reference range applies only to samples taken after fasting for at least 8 hours.  Glucose, capillary     Status: Abnormal   Collection Time: 03/24/21  4:12 AM  Result Value Ref Range   Glucose-Capillary 164 (H) 70 - 99 mg/dL    Comment: Glucose reference range applies only to samples taken after fasting for at least 8 hours.   Comment 1 Notify RN    Comment 2 Document in Chart   Glucose, capillary     Status: Abnormal   Collection Time: 03/24/21  8:00 AM  Result Value Ref Range   Glucose-Capillary 225 (H) 70 - 99 mg/dL    Comment: Glucose reference range applies only to samples taken after fasting for at least 8 hours.  Glucose, capillary     Status: Abnormal   Collection Time: 03/24/21 11:39 AM  Result Value Ref Range   Glucose-Capillary 146 (H) 70 - 99 mg/dL    Comment: Glucose reference range applies only to samples taken after fasting for at least 8 hours.    Current Facility-Administered Medications  Medication Dose Route Frequency Provider Last Rate Last Admin   acetaminophen (TYLENOL) tablet 650 mg  650 mg Oral Q4H PRN Harlon Ditty D, NP       ascorbic acid (VITAMIN C) tablet 500 mg  500 mg Per Tube Daily Charise Killian, MD   500 mg at 03/25/21 0817   chlorhexidine (PERIDEX) 0.12 % solution 15 mL  15 mL Mouth Rinse BID Alberteen Sam, MD   15 mL at 03/25/21 0817   Chlorhexidine Gluconate Cloth 2 % PADS 6 each  6 each Topical Daily Erin Fulling,  MD   6 each at 03/25/21 0818   cholecalciferol (VITAMIN D3) tablet 1,000 Units  1,000 Units Oral Daily [name], MD   1,000 Units at 03/25/21 0817   docusate sodium (COLACE) capsule 100 mg  100 mg Oral BID PRN Judithe Modest, NP  enoxaparin (LOVENOX) injection 40 mg  40 mg Subcutaneous Q24H Angelique Blonder, RPH   40 mg at 03/25/21 0263   feeding supplement (NEPRO CARB STEADY) liquid 237 mL  237 mL Oral BID BM Lynn Ito, MD   237 mL at 03/25/21 1332   feeding supplement (OSMOLITE 1.2 CAL) liquid 1,000 mL  1,000 mL Per Tube Continuous [name], MD 60 mL/hr at 03/25/21 0415 1,000 mL at 03/25/21 0415   free water 60 mL  60 mL Per Tube Q4H Lynn Ito, MD   60 mL at 03/25/21 1522   glimepiride (AMARYL) tablet 2 mg  2 mg Oral Daily [name], MD   2 mg at 03/25/21 7858   insulin aspart (novoLOG) injection 0-9 Units  0-9 Units Subcutaneous Q4H Alford Highland, MD   3 Units at 03/25/21 1211   MEDLINE mouth rinse  15 mL Mouth Rinse q12n4p Alberteen Sam, MD   15 mL at 03/25/21 1522   metoprolol tartrate (LOPRESSOR) tablet 12.5 mg  12.5 mg Oral BID Standley Brooking, MD   12.5 mg at 03/25/21 0817   ondansetron (ZOFRAN) injection 4 mg  4 mg Intravenous Q6H PRN Judithe Modest, NP       polyethylene glycol (MIRALAX / GLYCOLAX) packet 17 g  17 g Oral Daily [name], MD   17 g at 03/25/21 0817   pravastatin (PRAVACHOL) tablet 40 mg  40 mg Oral q1800 [name], MD   40 mg at 03/24/21 1740   senna-docusate (Senokot-S) tablet 1 tablet  1 tablet Oral BID [name], MD   1 tablet at 03/25/21 8502   thiothixene (NAVANE) capsule 2 mg  2 mg Per Tube BID Judia Arnott, Jackquline Denmark, MD   2 mg at 03/25/21 7741    Musculoskeletal: Strength & Muscle Tone: decreased Gait & Station: unsteady Patient leans: N/A            Psychiatric Specialty Exam:  Presentation  General Appearance:  No data recorded Eye Contact: No data recorded Speech: No data recorded Speech Volume: No  data recorded Handedness: No data recorded  Mood and Affect  Mood: No data recorded Affect: No data recorded  Thought Process  Thought Processes: No data recorded Descriptions of Associations:No data recorded Orientation:No data recorded Thought Content:No data recorded History of Schizophrenia/Schizoaffective disorder:No data recorded Duration of Psychotic Symptoms:No data recorded Hallucinations:No data recorded Ideas of Reference:No data recorded Suicidal Thoughts:No data recorded Homicidal Thoughts:No data recorded  Sensorium  Memory: No data recorded Judgment: No data recorded Insight: No data recorded  Executive Functions  Concentration: No data recorded Attention Span: No data recorded Recall: No data recorded Fund of Knowledge: No data recorded Language: No data recorded  Psychomotor Activity  Psychomotor Activity: No data recorded  Assets  Assets: No data recorded  Sleep  Sleep: No data recorded  Physical Exam: Physical Exam Vitals and nursing note reviewed.  Constitutional:      Appearance: Normal appearance. She is ill-appearing.  HENT:     Head: Normocephalic and atraumatic.     Mouth/Throat:     Pharynx: Oropharynx is clear.  Eyes:     Pupils: Pupils are equal, round, and reactive to light.  Cardiovascular:     Rate and Rhythm: Normal rate and regular rhythm.  Pulmonary:     Effort: Pulmonary effort is normal.     Breath sounds: Normal breath sounds.  Abdominal:     General: Abdomen is flat.     Palpations: Abdomen is  soft.  Musculoskeletal:        General: Normal range of motion.  Skin:    General: Skin is warm and dry.  Neurological:     General: No focal deficit present.     Mental Status: She is alert. Mental status is at baseline.  Psychiatric:        Mood and Affect: Mood normal. Affect is blunt.        Speech: Speech is delayed.        Behavior: Behavior is slowed.   Review of Systems  Unable to perform ROS:  Psychiatric disorder  Blood pressure (!) 146/53, pulse 79, temperature 98.3 F (36.8 C), resp. rate 14, height 5\' 2"  (1.575 m), weight 47.5 kg, SpO2 100 %. Body mass index is 19.15 kg/m.  Treatment Plan Summary: Medication management and Plan I will reassess her tomorrow.  I am starting to think she may be showing as much improvement as she is going to with ECT and that we might not restart but I will make that decision then.  Psychiatric medicines reviewed.  Disposition: Patient does not meet criteria for psychiatric inpatient admission.  , MD 03/25/2021 4:53 PM

## 2021-03-26 DIAGNOSIS — E1165 Type 2 diabetes mellitus with hyperglycemia: Secondary | ICD-10-CM | POA: Diagnosis not present

## 2021-03-26 DIAGNOSIS — F202 Catatonic schizophrenia: Secondary | ICD-10-CM | POA: Diagnosis not present

## 2021-03-26 DIAGNOSIS — G9341 Metabolic encephalopathy: Secondary | ICD-10-CM | POA: Diagnosis not present

## 2021-03-26 NOTE — Assessment & Plan Note (Signed)
continue statin

## 2021-03-26 NOTE — Progress Notes (Signed)
  Progress Note  Erica Mueller   IEP:[id number]  DOB: Jun 02, 1951  DOA: 02/07/2021     47 Date of Service: 03/26/2021   Clinical Course 70 year old woman from [facility] PMH schizophrenia, admitted for severe sepsis thought initially secondary to UTI, but subsequently determined to be secondary to aspiration pneumonia.  Quickly transferred out of the ICU.  Early course complicated by UTI, acute kidney injury, all of which rapidly improved.  Toxic metabolic encephalopathy and failure to thrive persisted, resulting in the feeding tube placement secondary to cognitive impairment.  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 --Continues with ECT, when complete, transferred to long-term care.  Assessment and Plan * Sepsis (HCC) -- Secondary to aspiration pneumonia, resolved  Schizophrenia (HCC) -- With catatonic state. Remains awake, alert, next ECT 10/17.  To long-term care when cleared by psychiatry  Anemia of chronic disease -- Stable.  Chronic combined systolic and diastolic CHF (congestive heart failure) (HCC) -- New diagnosis this admission.  Appears well compensated.  Conservative management at this point given overall clinical status.  If patient continues to improve, outpatient follow-up with cardiology. -- Continue beta-blocker.  Start ACE inhibitor.  Appears euvolemic, no indication for diuretic presently.  Failure to thrive in adult -- Continue tube feeds.  Protein-calorie malnutrition, severe -- Continue tube feeds  Stage II pressure ulcer (HCC) -- Wound care  Type 2 diabetes mellitus with hyperglycemia, without long-term current use of insulin (HCC) -- Stable, continue Amaryl, sliding scale insulin.  Emphysema (HCC) -- Asymptomatic.  No hypoxia.  Monitor clinically.  Aortic atherosclerosis (HCC) --continue statin  Ankle wound --wound care per WOC, present on  admission  Thrombocytopenia (HCC) --secondary to sepsis, resolved  Acute respiratory failure with hypoxia (HCC) -- Resolved.  Secondary to aspiration pneumonia.  Nonspecific complex cystic structure superficial soft tissues right axilla. --Nonspecific complex cystic structure in the superficial soft tissues of the right axilla likely benign cyst. Follow-up as outpatient.  Aspiration pneumonia (HCC) -- Resolved  COVID-19 virus infection -- Resolved  AKI (acute kidney injury) (HCC) -- Resolved  Hypernatremia -- Resolved  Acute metabolic encephalopathy -- Improving with ECT.  Subjective:  Seems to feel ok  Objective Vitals:   03/25/21 2003 03/26/21 0355 03/26/21 0825 03/26/21 1224  BP: (!) 151/47 (!) 137/35 125/63 (!) 116/37  Pulse: 70 63 76 65  Resp: 16 14 17 17   Temp: 98.2 F (36.8 C) 98.5 F (36.9 C) 97.7 F (36.5 C)   TempSrc:      SpO2: 98% 100% 98% 99%  Weight:      Height:       47.5 kg  Vital signs were reviewed and unremarkable.  Exam Physical Exam Vitals reviewed.  Constitutional:      General: She is not in acute distress.    Appearance: She is not ill-appearing.  Cardiovascular:     Rate and Rhythm: Normal rate and regular rhythm.     Heart sounds: No murmur heard. Pulmonary:     Effort: Pulmonary effort is normal.     Breath sounds: No wheezing, rhonchi or rales.  Psychiatric:     Comments: Appears stable    Labs / Other Information There are no new results to review at this time.  Disposition Plan: Status is: Inpatient  Remains inpatient appropriate because: ECT  Called son, no answer  Full code enoxaparin  Time spent: 20 minutes [facility] 03/26/2021, 2:45 PM

## 2021-03-26 NOTE — Consult Note (Signed)
Erica Mueller Face-to-Face Psychiatry Consult   Reason for Consult: Follow-up for this patient with schizophrenia who received ECT for catatonia and depression Referring Physician: Irene Limbo Patient Identification: Erica Mueller MRN:  [id number] Principal Diagnosis: Sepsis [facility]) Diagnosis:  Principal Problem:   Sepsis (HCC) Active Problems:   Type 2 diabetes mellitus with hyperglycemia, without long-term current use of insulin (HCC)   Schizophrenia (HCC)   Acute metabolic encephalopathy   Stage II pressure ulcer (HCC)   Protein-calorie malnutrition, severe   Failure to thrive in adult   Hypernatremia   Catatonia   AKI (acute kidney injury) (HCC)   COVID-19 virus infection   Ankle wound   Aspiration pneumonia (HCC)   Chronic combined systolic and diastolic CHF (congestive heart failure) (HCC)   Anemia of chronic disease   Aortic atherosclerosis (HCC)   Emphysema (HCC)   Nonspecific complex cystic structure superficial soft tissues right axilla.   Acute respiratory failure with hypoxia (HCC)   Thrombocytopenia (HCC)   Total Time spent with patient: 30 minutes  Subjective:   Erica Mueller is a 70 y.o. female patient admitted with "I am feeling pretty good".  HPI: Patient was sitting up in bed.  She had food in front of her.  She had a fork and spoon out but seemed a little confused as to how to use them.  Was just tapping one against the other.  She was able however to make eye contact and had lucid back-and-forth speech.  She denied any hallucinations and did not appear to be clearly responding to internal stimuli.  She was smiling did not appear depressed at all.  Past Psychiatric History: Past history of schizophrenia  Risk to Self:   Risk to Others:   Prior Inpatient Therapy:   Prior Outpatient Therapy:    Past Medical History:  Past Medical History:  Diagnosis Date   Coronary artery disease    Depression    Diabetes mellitus without complication (HCC)    Elevated lipids     GERD (gastroesophageal reflux disease)    Hypertension    Schizophrenia (HCC)    Syphilis (acquired)     Past Surgical History:  Procedure Laterality Date   COLONOSCOPY     COLONOSCOPY WITH PROPOFOL N/A 09/01/2015   Procedure: COLONOSCOPY WITH PROPOFOL;  Surgeon: Wallace Cullens, MD;  Location: [facility] ENDOSCOPY;  Service: Gastroenterology;  Laterality: N/A;   IR FLUORO RM 30-60 MIN  02/23/2021   IR GASTROSTOMY TUBE MOD SED  02/24/2021   IR REPLC GASTRO/COLONIC TUBE PERCUT W/FLUORO  02/27/2021   TUBAL LIGATION     wisdom teeth removal     Family History:  Family History  Problem Relation Age of Onset   Cancer Mother    Alcoholism Mother    Bone cancer Father    Heart disease Father    Diabetes Maternal Grandmother    Suicidality Paternal Uncle    Breast cancer Neg Hx    Family Psychiatric  History: See previous Social History:  Social History   Substance and Sexual Activity  Alcohol Use Yes   Alcohol/week: 3.0 standard drinks   Types: 2 Cans of beer, 1 Shots of liquor per week     Social History   Substance and Sexual Activity  Drug Use Not Currently   Types: Marijuana   Comment: PAST     Social History   Socioeconomic History   Marital status: Divorced    Spouse name: Not on file   Number of children: Not on  file   Years of education: Not on file   Highest education level: Not on file  Occupational History   Not on file  Tobacco Use   Smoking status: Every Day    Packs/day: 1.00    Years: 48.00    Pack years: 48.00    Types: Cigarettes    Start date: 12/20/1969   Smokeless tobacco: Never  Vaping Use   Vaping Use: Never used  Substance and Sexual Activity   Alcohol use: Yes    Alcohol/week: 3.0 standard drinks    Types: 2 Cans of beer, 1 Shots of liquor per week   Drug use: Not Currently    Types: Marijuana    Comment: PAST    Sexual activity: Not Currently    Birth control/protection: None  Other Topics Concern   Not on file  Social History Narrative    Not on file   Social Determinants of Health   Financial Resource Strain: Not on file  Food Insecurity: Not on file  Transportation Needs: Not on file  Physical Activity: Not on file  Stress: Not on file  Social Connections: Not on file   Additional Social History:    Allergies:  No Known Allergies  Labs: No results found for this or any previous visit (from the past 48 hour(s)).  Current Facility-Administered Medications  Medication Dose Route Frequency Provider Last Rate Last Admin   acetaminophen (TYLENOL) tablet 650 mg  650 mg Oral Q4H PRN Harlon Ditty D, NP       ascorbic acid (VITAMIN C) tablet 500 mg  500 mg Per Tube Daily Charise Killian, MD   500 mg at 03/26/21 0940   chlorhexidine (PERIDEX) 0.12 % solution 15 mL  15 mL Mouth Rinse BID Alberteen Sam, MD   15 mL at 03/26/21 1000   Chlorhexidine Gluconate Cloth 2 % PADS 6 each  6 each Topical Daily Erin Fulling, MD   6 each at 03/26/21 1123   cholecalciferol (VITAMIN D3) tablet 1,000 Units  1,000 Units Oral Daily [name], MD   1,000 Units at 03/26/21 0940   docusate sodium (COLACE) capsule 100 mg  100 mg Oral BID PRN Judithe Modest, NP       enoxaparin (LOVENOX) injection 40 mg  40 mg Subcutaneous Q24H Bari Mantis A, RPH   40 mg at 03/26/21 0541   feeding supplement (NEPRO CARB STEADY) liquid 237 mL  237 mL Oral BID BM Lynn Ito, MD 0 mL/hr at 03/25/21 1400 237 mL at 03/26/21 0942   feeding supplement (OSMOLITE 1.2 CAL) liquid 1,000 mL  1,000 mL Per Tube Continuous [name], MD 60 mL/hr at 03/25/21 1728 1,000 mL at 03/25/21 1728   free water 60 mL  60 mL Per Tube Q4H Lynn Ito, MD   60 mL at 03/26/21 1124   glimepiride (AMARYL) tablet 2 mg  2 mg Oral Daily [name], MD   2 mg at 03/26/21 0941   insulin aspart (novoLOG) injection 0-9 Units  0-9 Units Subcutaneous Q4H Alford Highland, MD   1 Units at 03/26/21 1240   lisinopril (ZESTRIL) tablet 5 mg  5 mg Per Tube Daily Standley Brooking,  MD   5 mg at 03/26/21 0941   MEDLINE mouth rinse  15 mL Mouth Rinse q12n4p Danford, Earl Lites, MD   15 mL at 03/26/21 1124   metoprolol tartrate (LOPRESSOR) tablet 12.5 mg  12.5 mg Oral BID Standley Brooking, MD   12.5 mg at  03/26/21 0940   ondansetron (ZOFRAN) injection 4 mg  4 mg Intravenous Q6H PRN Harlon Ditty D, NP       polyethylene glycol (MIRALAX / GLYCOLAX) packet 17 g  17 g Oral Daily [name], MD   17 g at 03/26/21 0941   pravastatin (PRAVACHOL) tablet 40 mg  40 mg Oral q1800 [name], MD   40 mg at 03/25/21 1723   senna-docusate (Senokot-S) tablet 1 tablet  1 tablet Oral BID [name], MD   1 tablet at 03/26/21 0940   thiothixene (NAVANE) capsule 2 mg  2 mg Per Tube BID Chloie Loney, Jackquline Denmark, MD   2 mg at 03/26/21 1610    Musculoskeletal: Strength & Muscle Tone: within normal limits Gait & Station: unsteady Patient leans: N/A            Psychiatric Specialty Exam:  Presentation  General Appearance:  No data recorded Eye Contact: No data recorded Speech: No data recorded Speech Volume: No data recorded Handedness: No data recorded  Mood and Affect  Mood: No data recorded Affect: No data recorded  Thought Process  Thought Processes: No data recorded Descriptions of Associations:No data recorded Orientation:No data recorded Thought Content:No data recorded History of Schizophrenia/Schizoaffective disorder:No data recorded Duration of Psychotic Symptoms:No data recorded Hallucinations:No data recorded Ideas of Reference:No data recorded Suicidal Thoughts:No data recorded Homicidal Thoughts:No data recorded  Sensorium  Memory: No data recorded Judgment: No data recorded Insight: No data recorded  Executive Functions  Concentration: No data recorded Attention Span: No data recorded Recall: No data recorded Fund of Knowledge: No data recorded Language: No data recorded  Psychomotor Activity  Psychomotor Activity: No data  recorded  Assets  Assets: No data recorded  Sleep  Sleep: No data recorded  Physical Exam: Physical Exam Vitals and nursing note reviewed.  Constitutional:      Appearance: Normal appearance. She is ill-appearing.  HENT:     Head: Normocephalic and atraumatic.     Mouth/Throat:     Pharynx: Oropharynx is clear.  Eyes:     Pupils: Pupils are equal, round, and reactive to light.  Cardiovascular:     Rate and Rhythm: Normal rate and regular rhythm.  Pulmonary:     Effort: Pulmonary effort is normal.     Breath sounds: Normal breath sounds.  Abdominal:     General: Abdomen is flat.     Palpations: Abdomen is soft.  Musculoskeletal:        General: Normal range of motion.  Skin:    General: Skin is warm and dry.  Neurological:     General: No focal deficit present.     Mental Status: She is alert. Mental status is at baseline.  Psychiatric:        Attention and Perception: She is inattentive.        Mood and Affect: Mood normal. Affect is blunt.        Speech: Speech is delayed.        Behavior: Behavior is slowed.        Thought Content: Thought content normal.        Cognition and Memory: Cognition is impaired.   Review of Systems  Constitutional: Negative.   HENT: Negative.    Eyes: Negative.   Respiratory: Negative.    Cardiovascular: Negative.   Gastrointestinal: Negative.   Musculoskeletal: Negative.   Skin: Negative.   Neurological: Negative.   Psychiatric/Behavioral: Negative.    Blood pressure (!) 116/37, pulse 65, temperature 97.7  F (36.5 C), resp. rate 17, height 5\' 2"  (1.575 m), weight 47.5 kg, SpO2 99 %. Body mass index is 19.15 kg/m.  Treatment Plan Summary: Plan at this point she is certainly not catatonic and I think she is not showing clear signs of depression.  Therefore I am not going to recommend continuing ECT.  I do think she seems to have some cognitive problems some of those might be from the ECT and so it is better that we hold off on  treatment and see how those recover.  ECT induced cognitive problems should resolve within a couple weeks of finishing treatment.  Some of them could be a result of her extended illness but I think that will remain to be seen.  At this point I am going to not change any further the current psych meds.  We will follow up as needed.  Patient may be starting to get to a phase where placement issues would be paramount.  Disposition: No evidence of imminent risk to self or others at present.   Patient does not meet criteria for psychiatric inpatient admission. Supportive therapy provided about ongoing stressors.  , MD 03/26/2021 3:26 PM

## 2021-03-27 DIAGNOSIS — G9341 Metabolic encephalopathy: Secondary | ICD-10-CM | POA: Diagnosis not present

## 2021-03-27 DIAGNOSIS — F202 Catatonic schizophrenia: Secondary | ICD-10-CM | POA: Diagnosis not present

## 2021-03-27 DIAGNOSIS — E1165 Type 2 diabetes mellitus with hyperglycemia: Secondary | ICD-10-CM | POA: Diagnosis not present

## 2021-03-27 LAB — GLUCOSE, CAPILLARY
Glucose-Capillary: 112 mg/dL — ABNORMAL HIGH (ref 70–99)
Glucose-Capillary: 118 mg/dL — ABNORMAL HIGH (ref 70–99)
Glucose-Capillary: 127 mg/dL — ABNORMAL HIGH (ref 70–99)
Glucose-Capillary: 129 mg/dL — ABNORMAL HIGH (ref 70–99)
Glucose-Capillary: 131 mg/dL — ABNORMAL HIGH (ref 70–99)
Glucose-Capillary: 159 mg/dL — ABNORMAL HIGH (ref 70–99)
Glucose-Capillary: 159 mg/dL — ABNORMAL HIGH (ref 70–99)
Glucose-Capillary: 162 mg/dL — ABNORMAL HIGH (ref 70–99)
Glucose-Capillary: 180 mg/dL — ABNORMAL HIGH (ref 70–99)
Glucose-Capillary: 182 mg/dL — ABNORMAL HIGH (ref 70–99)
Glucose-Capillary: 187 mg/dL — ABNORMAL HIGH (ref 70–99)
Glucose-Capillary: 193 mg/dL — ABNORMAL HIGH (ref 70–99)
Glucose-Capillary: 215 mg/dL — ABNORMAL HIGH (ref 70–99)
Glucose-Capillary: 224 mg/dL — ABNORMAL HIGH (ref 70–99)
Glucose-Capillary: 239 mg/dL — ABNORMAL HIGH (ref 70–99)
Glucose-Capillary: 266 mg/dL — ABNORMAL HIGH (ref 70–99)
Glucose-Capillary: 192 mg/dL — ABNORMAL HIGH (ref 70–99)
Glucose-Capillary: 256 mg/dL — ABNORMAL HIGH (ref 70–99)
Glucose-Capillary: 83 mg/dL (ref 70–99)

## 2021-03-27 LAB — RESP PANEL BY RT-PCR (FLU A&B, COVID) ARPGX2
Influenza A by PCR: NEGATIVE
Influenza B by PCR: NEGATIVE
SARS Coronavirus 2 by RT PCR: NEGATIVE

## 2021-03-27 NOTE — Progress Notes (Addendum)
Physical Therapy Treatment Patient Details Name: Erica Mueller MRN: [id number] DOB: July 01, 1950 Today's Date: 03/27/2021   History of Present Illness Pt is a 70 y/o F with PMH:  schizophrenia, HTN, GERD, type 2 diabetes mellitus,  depression and CAD. Pt presented on 02/07/21 d/t severe sepsis 2/2 COVID-19 infection. PEG tube placed 9/16 and tube changed 9/19. Pt has been catatonic. Phsychiatry started pt back on her baseline psych meds as well as ECT. Pt has become more interactive.    PT Comments    Pt alert in bed working with OT. Co- treat performed today to address ADLs/functional mobility and physical assist during transfers. Pt more unwilling for therapy this session demonstrating decreased task initiation and stating "no" when asked to mobilize. Pt denied all pain and stated she was "ok" to sit upright in recliner. However, pt does not physically resist assistance when provided. MAX A for bed mobility, transfers, and bed > recliner transfer. Discharge recommendations remain SNF. Skilled PT intervention is indicated to address deficits in function, mobility, and to return to PLOF as able.     Recommendations for follow up therapy are one component of a multi-disciplinary discharge planning process, led by the attending physician.  Recommendations may be updated based on patient status, additional functional criteria and insurance authorization.  Follow Up Recommendations  SNF;Supervision for mobility/OOB;Supervision/Assistance - 24 hour     Equipment Recommendations  None recommended by PT    Recommendations for Other Services       Precautions / Restrictions Precautions Precautions: Fall Precaution Comments: G-tube Restrictions Weight Bearing Restrictions: No     Mobility  Bed Mobility Overal bed mobility: Needs Assistance Bed Mobility: Supine to Sit     Supine to sit: HOB elevated;Max assist     General bed mobility comments: MAX A for task initiation, BLE and trunk     Transfers Overall transfer level: Needs assistance Equipment used: 2 person hand held assist Transfers: Sit to/from Stand Sit to Stand: +2 physical assistance;Max assist;From elevated surface;+2 safety/equipment         General transfer comment: BLE braced against bed w/ posterior lean w/ cues for forward lean (pt largely unwilling to lean fwd); pt did attempt to stand without PT support and unable to clear bottom from bed  Ambulation/Gait Ambulation/Gait assistance: +2 physical assistance;Max assist Gait Distance (Feet): 1 Feet Assistive device: 2 person hand held assist Gait Pattern/deviations: Step-to pattern Gait velocity: Decreased   General Gait Details: Able to move feet when unweighted by sliding over to chair but requires MAX A for safety, balance   Stairs             Wheelchair Mobility    Modified Rankin (Stroke Patients Only)       Balance Overall balance assessment: Needs assistance Sitting-balance support: Feet supported;Single extremity supported Sitting balance-Leahy Scale: Fair Sitting balance - Comments: posterior lean initially but able to support trunk w/ single UE   Standing balance support: Bilateral upper extremity supported Standing balance-Leahy Scale: Zero Standing balance comment: requires MAX A for static stance                            Cognition Arousal/Alertness: Awake/alert Behavior During Therapy: WFL for tasks assessed/performed Overall Cognitive Status: History of cognitive impairments - at baseline Area of Impairment: Orientation;Memory;Safety/judgement;Problem solving;Following commands                 Orientation Level: Disoriented to;Place;Time;Situation;Person   Memory:  Decreased short-term memory Following Commands: Follows one step commands with increased time;Follows one step commands inconsistently Safety/Judgement: Decreased awareness of safety;Decreased awareness of deficits   Problem  Solving: Difficulty sequencing;Decreased initiation;Requires verbal cues;Requires tactile cues;Slow processing General Comments: Pt alert, less willing to work with therapy this session but pleasant overall      Exercises Other Exercises Other Exercises: Pt incontient of urine & BM, PT/OT/nursing assisted w/ clean up; MAX A for rolling with cues for participation    General Comments        Pertinent Vitals/Pain Pain Assessment: Faces Faces Pain Scale: No hurt Pain Intervention(s): Monitored during session    Home Living                      Prior Function            PT Goals (current goals can now be found in the care plan section) Progress towards PT goals: Progressing toward goals    Frequency    Min 2X/week      PT Plan Current plan remains appropriate    Co-evaluation PT/OT/SLP Co-Evaluation/Treatment: Yes Reason for Co-Treatment: Necessary to address cognition/behavior during functional activity;To address functional/ADL transfers PT goals addressed during session: Mobility/safety with mobility OT goals addressed during session: ADL's and self-care      AM-PAC PT "6 Clicks" Mobility   Outcome Measure  Help needed turning from your back to your side while in a flat bed without using bedrails?: A Lot Help needed moving from lying on your back to sitting on the side of a flat bed without using bedrails?: A Lot Help needed moving to and from a bed to a chair (including a wheelchair)?: A Lot Help needed standing up from a chair using your arms (e.g., wheelchair or bedside chair)?: Total Help needed to walk in hospital room?: Total Help needed climbing 3-5 steps with a railing? : Total 6 Click Score: 9    End of Session Equipment Utilized During Treatment: Gait belt Activity Tolerance: Patient tolerated treatment well Patient left: in chair;with call bell/phone within reach;with chair alarm set;Other (comment) Nurse Communication: Mobility status PT  Visit Diagnosis: Unsteadiness on feet (R26.81);Other abnormalities of gait and mobility (R26.89);Muscle weakness (generalized) (M62.81);Difficulty in walking, not elsewhere classified (R26.2)     Time: 7628-3151 PT Time Calculation (min) (ACUTE ONLY): 32 min  Charges:                        [employer], SPT

## 2021-03-27 NOTE — Progress Notes (Signed)
Nutrition Follow-up  DOCUMENTATION CODES:  Severe malnutrition in context of social or environmental circumstances  INTERVENTION:  Continue diet as ordered per SLP, advance as tolerated Nursing staff to assist with meal set-up and feeding Nursing to record meal intake percentages to assess oral intake. Continue enteral feeds via PEG Osmolite 1.2 @ 47m/h (1.44L/d)  667mfree water q4h Regimen will provide 1728 kcal, 80g of protein, and 154145mf free water (TF+flush) Weight pt weekly  NUTRITION DIAGNOSIS:  Severe Malnutrition related to social / environmental circumstances as evidenced by severe fat depletion, severe muscle depletion. -ongoing  GOAL:  Patient will meet greater than or equal to 90% of their needs - met with TF  MONITOR:  Labs, Weight trends, TF tolerance, Skin, I & O's  REASON FOR ASSESSMENT:  Malnutrition Screening Tool    ASSESSMENT:  [age]o. female with medical history of HTN, DM, schizophrenia, depression, CAD, GERD, recent COVID 19 65d HLD who is admitted with aspiration PNA and sepsis.  9/16 PEG placed 9/20 g-tube exchanged due to malfunction 9/21 ECT initiated  9/26 diet advanced per SLP  TF continue to run at goal with good tolerance. Continues to eat some meals orally. Noted that psychiatry does not plan further ECT at this time. Will return to LTC facility when cleared medically.  Average Meal Intake: 9/26-10/3: 63% intake x 2 recorded meals 10/4-10/10: 75% intake x 5 recorded meals 10/11-10/17: 44% intake x 7 recorded meals  Nutritionally Relevant Medications: Scheduled Meds:  vitamin C  500 mg Per Tube Daily   cholecalciferol  1,000 Units Oral Daily   NEPRO CARB STEADY  237 mL Oral BID BM   free water  60 mL Per Tube Q4H   glimepiride  2 mg Oral Daily   insulin aspart  0-9 Units Subcutaneous Q4H   polyethylene glycol  17 g Oral Daily   pravastatin  40 mg Oral q1800   senna-docusate  1 tablet Oral BID   Continuous Infusions:   feeding supplement (OSMOLITE 1.2 CAL) 1,000 mL (03/25/21 1728)   PRN Meds: docusate sodium, ondansetron  Labs Reviewed: HgbA1c: 7/9% (8/30)  NUTRITION - FOCUSED PHYSICAL EXAM: Flowsheet Row Most Recent Value  Orbital Region Moderate depletion  Upper Arm Region Severe depletion  Thoracic and Lumbar Region Severe depletion  Buccal Region Moderate depletion  Temple Region Moderate depletion  Clavicle Bone Region Moderate depletion  Clavicle and Acromion Bone Region Severe depletion  Scapular Bone Region Severe depletion  Dorsal Hand Moderate depletion  Patellar Region Severe depletion  Anterior Thigh Region Severe depletion  Posterior Calf Region Moderate depletion  Edema (RD Assessment) None  Hair Reviewed  Eyes Reviewed  Mouth Reviewed  Skin Reviewed  Nails Reviewed   Diet Order:   Diet Order             DIET - DYS 1 Room service appropriate? Yes with Assist; Fluid consistency: Nectar Thick  Diet effective now                  EDUCATION NEEDS:  No education needs have been identified at this time  Skin:  Skin Assessment: Skin Integrity Issues: Skin Integrity Issues:: Stage II Stage II: sacrum  Last BM:  10/17 - type 6  Height:  Ht Readings from Last 1 Encounters:  03/22/21 5' 2" (1.575 m)   Weight:  Wt Readings from Last 1 Encounters:  03/22/21 47.5 kg   Ideal Body Weight:  50 kg  BMI:  Body mass index is 19.15  kg/m.  Estimated Nutritional Needs:  Kcal:  1500-1700 kcal/d Protein:  75-85 g/d Fluid:  1.2-1.4L/day  Ranell Patrick, RD, LDN Clinical Dietitian Pager on [place]

## 2021-03-27 NOTE — Final Progress Note (Addendum)
BS not linking to results review. Midnight BS 131mg /dl. 1 unit insulin administered.  BS 183mg /dl at  Patient stable during shift, no adverse events. Tube feeding continuous tolerated medications via tube with no residuals. Site clean, dry and intact. Vitals stable, no respiratory distress on room air. Will continue to monitor.

## 2021-03-27 NOTE — Progress Notes (Signed)
Progress Note Erica Mueller   DDU:[id number]  DOB: 1951-02-10  DOA: 02/07/2021     48 Date of Service: 03/27/2021   Clinical Course 70 year old woman from [facility] PMH schizophrenia, admitted for severe sepsis thought initially secondary to UTI, but subsequently determined to be secondary to aspiration pneumonia.  Quickly transferred out of the ICU.  Early course complicated by UTI, acute kidney injury, all of which rapidly improved.  Toxic metabolic encephalopathy and failure to thrive persisted, resulting in the feeding tube placement secondary to cognitive impairment.  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 -- Medically stable for transfer  Assessment and Plan * Sepsis (HCC) -- Secondary to aspiration pneumonia, resolved  Catatonia -- Resolved  Schizophrenia (HCC) -- With catatonic state. Remains awake, alert.  Discussed with Dr. Toni Amend, no further ECT planned, he recommends outpatient follow-up in 2 weeks, continue Navane.  Anemia of chronic disease -- Stable.  Chronic combined systolic and diastolic CHF (congestive heart failure) (HCC) -- New diagnosis this admission.  Appears well compensated.  Conservative management at this point given overall clinical status.  If patient continues to improve, outpatient follow-up with cardiology. -- Continue beta-blocker, ACE inhibitor.  Appears euvolemic, no indication for diuretic presently.  Failure to thrive in adult -- Continue tube feeds but see above  Protein-calorie malnutrition, severe -- Continue tube feeds, however mentation continues to remain stable and oral intake is sufficient, may be able to remove tube in the next 4 weeks  Stage II pressure ulcer (HCC) -- Wound care  Type 2 diabetes mellitus with hyperglycemia, without long-term current use of insulin (HCC) -- Stable, continue Amaryl, sliding scale insulin.  Emphysema (HCC) --  Asymptomatic.  No hypoxia.  Monitor clinically.  Aortic atherosclerosis (HCC) --continue statin  Ankle wound --wound care per WOC, present on admission  Thrombocytopenia (HCC) --secondary to sepsis, resolved  Acute respiratory failure with hypoxia (HCC) -- Resolved.  Secondary to aspiration pneumonia.  Nonspecific complex cystic structure superficial soft tissues right axilla. --Nonspecific complex cystic structure in the superficial soft tissues of the right axilla likely benign cyst. Follow-up as outpatient.  Aspiration pneumonia (HCC) -- Resolved  COVID-19 virus infection -- Resolved  AKI (acute kidney injury) (HCC) -- Resolved  Hypernatremia -- Resolved  Acute metabolic encephalopathy -- Improved with ECT.  Further improvement will likely be quite slow.  Subjective:  Seems to feel ok  Objective Vitals:   03/26/21 2018 03/27/21 0454 03/27/21 0740 03/27/21 1208  BP: (!) 115/42 (!) 120/45 (!) 122/35 (!) 142/45  Pulse: 83 70 71 71  Resp: 16 17 15 14   Temp: 97.7 F (36.5 C) 97.7 F (36.5 C) 97.8 F (36.6 C) 98.1 F (36.7 C)  TempSrc:   Oral   SpO2: 100% 100% 94% 97%  Weight:      Height:       47.5 kg  Vital signs were reviewed and unremarkable.  Exam Physical Exam Cardiovascular:     Rate and Rhythm: Normal rate and regular rhythm.     Heart sounds: No murmur heard. Pulmonary:     Effort: Pulmonary effort is normal. No respiratory distress.     Breath sounds: No wheezing, rhonchi or rales.  Neurological:     Mental Status: She is alert.  Psychiatric:     Comments: Answers simple questions    Labs / Other Information My review of labs, imaging, notes and other tests is significant for    CBG  stable  Disposition Plan: Status is: Inpatient  Remains inpatient appropriate because: needs nursing care, awaiting authorization to return to facility   Updated son JAZZLYNN RAWE by telephone  Time spent: 20 minutes [facility] 03/27/2021, 3:39 PM

## 2021-03-27 NOTE — TOC Progression Note (Signed)
Transition of Care [facility]) - Progression Note    Patient Details  Name: Erica Mueller MRN: [id number] Date of Birth: 1950/09/15  Transition of Care [facility]) CM/SW Contact  Marlowe Sax, RN Phone Number: 03/27/2021, 2:12 PM  Clinical Narrative:    [name] at [facility], they are able to accept the patient back tomorrow, I sent clinical notes to [facility] thru the Portal to obtained STR auth approval Ref number [id number]   Expected Discharge Plan: Long Term Nursing Home Barriers to Discharge: Continued Medical Work up  Expected Discharge Plan and Services Expected Discharge Plan: Long Term Nursing Home In-house Referral: Clinical Social Work   Post Acute Care Choice: Nursing Home Living arrangements for the past 2 months:  ([facility])                                       Social Determinants of Health (SDOH) Interventions    Readmission Risk Interventions No flowsheet data found.

## 2021-03-27 NOTE — Assessment & Plan Note (Addendum)
--   Resolved with ECT

## 2021-03-27 NOTE — Progress Notes (Signed)
Occupational Therapy Treatment Patient Details Name: Erica Mueller MRN: [id number] DOB: 1950/10/14 Today's Date: 03/27/2021   History of present illness Pt is a 70 y/o F with PMH:  schizophrenia, HTN, GERD, type 2 diabetes mellitus,  depression and CAD. Pt presented on 02/07/21 d/t severe sepsis 2/2 COVID-19 infection. PEG tube placed 9/16 and tube changed 9/19. Pt has been catatonic. Phsychiatry started pt back on her baseline psych meds as well as ECT. Pt has become more interactive.   OT comments  [name] presents today with generalized weakness, limited endurance, disorientation, and limited engagement. Required Max A + 2 for bed mobility, peri hygiene, transfers; Max A +1 for LB dressing; Min A +1 for UB dressing. Pt vacillates throughout session between short moments of alertness/engagement and longer periods of disengagement/confusion. Pt unable to provide responses to any questions, able to follow one-step directions inconsistently with increased time. Does not respond to questioning re: pain, but does not display any behaviors indicative of pain. Continue to recommend DC to SNF, to assist pt in addressing deficits in ADL performance and fxl mobility.   Recommendations for follow up therapy are one component of a multi-disciplinary discharge planning process, led by the attending physician.  Recommendations may be updated based on patient status, additional functional criteria and insurance authorization.    Follow Up Recommendations  SNF    Equipment Recommendations       Recommendations for Other Services      Precautions / Restrictions Precautions Precautions: Fall Precaution Comments: G-tube Restrictions Weight Bearing Restrictions: No       Mobility Bed Mobility Overal bed mobility: Needs Assistance Bed Mobility: Supine to Sit     Supine to sit: HOB elevated;Max assist;+2 for physical assistance     General bed mobility comments: MAX A for task initiation, BLE and  trunk    Transfers Overall transfer level: Needs assistance Equipment used: 2 person hand held assist Transfers: Sit to/from Stand Sit to Stand: +2 physical assistance;Max assist;From elevated surface;+2 safety/equipment Stand pivot transfers: Max assist       General transfer comment: BLE braced against bed w/ posterior lean w/ cues for forward lean (pt largely unwilling to lean fwd); pt did attempt to stand without PT support and unable to clear bottom from bed    Balance Overall balance assessment: Needs assistance Sitting-balance support: Feet supported;Single extremity supported Sitting balance-Leahy Scale: Fair Sitting balance - Comments: posterior lean initially but able to support trunk w/ single UE Postural control: Posterior lean Standing balance support: Bilateral upper extremity supported Standing balance-Leahy Scale: Zero Standing balance comment: requires MAX A for static stand                           ADL either performed or assessed with clinical judgement   ADL Overall ADL's : Needs assistance/impaired                 Upper Body Dressing : Min guard;Sitting Upper Body Dressing Details (indicate cue type and reason): to don gown Lower Body Dressing: Maximal assistance Lower Body Dressing Details (indicate cue type and reason): to don socks/shoes     Toileting- Clothing Manipulation and Hygiene: Maximal assistance;Bed level               Vision       Perception     Praxis      Cognition Arousal/Alertness: Awake/alert Behavior During Therapy: WFL for tasks assessed/performed Overall Cognitive Status:  History of cognitive impairments - at baseline Area of Impairment: Orientation;Memory;Safety/judgement;Problem solving;Following commands                 Orientation Level: Disoriented to;Place;Time;Situation;Person   Memory: Decreased short-term memory Following Commands: Follows one step commands with increased  time;Follows one step commands inconsistently Safety/Judgement: Decreased awareness of safety;Decreased awareness of deficits   Problem Solving: Difficulty sequencing;Decreased initiation;Requires verbal cues;Requires tactile cues;Slow processing General Comments: Pt alert, less willing to work with therapy this session but pleasant overall        Exercises Other Exercises Other Exercises: Pt incontient of urine & BM, PT/OT/nursing assisted w/ clean up; MAX A for rolling with cues for participation   Shoulder Instructions       General Comments      Pertinent Vitals/ Pain       Pain Assessment: No/denies pain Faces Pain Scale: No hurt Pain Intervention(s): Monitored during session  Home Living                                          Prior Functioning/Environment              Frequency           Progress Toward Goals  OT Goals(current goals can now be found in the care plan section)  Progress towards OT goals: Progressing toward goals  Acute Rehab OT Goals Patient Stated Goal: none stated OT Goal Formulation: Patient unable to participate in goal setting Time For Goal Achievement: 03/28/21 Potential to Achieve Goals: Fair  Plan Discharge plan remains appropriate    Co-evaluation    PT/OT/SLP Co-Evaluation/Treatment: Yes Reason for Co-Treatment: To address functional/ADL transfers;For patient/therapist safety PT goals addressed during session: Mobility/safety with mobility OT goals addressed during session: ADL's and self-care      AM-PAC OT "6 Clicks" Daily Activity     Outcome Measure   Help from another person eating meals?: A Little Help from another person taking care of personal grooming?: A Lot Help from another person toileting, which includes using toliet, bedpan, or urinal?: A Lot Help from another person bathing (including washing, rinsing, drying)?: A Lot Help from another person to put on and taking off regular upper  body clothing?: A Little Help from another person to put on and taking off regular lower body clothing?: A Lot 6 Click Score: 14    End of Session Equipment Utilized During Treatment: Gait belt  OT Visit Diagnosis: Other symptoms and signs involving cognitive function;Muscle weakness (generalized) (M62.81)   Activity Tolerance Patient tolerated treatment well   Patient Left in chair;with call bell/phone within reach;with chair alarm set   Nurse Communication          Time: 0109-3235 OT Time Calculation (min): 33 min  Charges: OT General Charges $OT Visit: 1 Visit OT Treatments $Self Care/Home Management : 8-22 mins  Latina Craver, PhD, MS, OTR/L 03/27/21, 1:28 PM

## 2021-03-28 ENCOUNTER — Inpatient Hospital Stay: Payer: Medicare Other

## 2021-03-28 DIAGNOSIS — E43 Unspecified severe protein-calorie malnutrition: Secondary | ICD-10-CM | POA: Diagnosis not present

## 2021-03-28 DIAGNOSIS — E1165 Type 2 diabetes mellitus with hyperglycemia: Secondary | ICD-10-CM | POA: Diagnosis not present

## 2021-03-28 DIAGNOSIS — G9341 Metabolic encephalopathy: Secondary | ICD-10-CM | POA: Diagnosis not present

## 2021-03-28 DIAGNOSIS — F202 Catatonic schizophrenia: Secondary | ICD-10-CM | POA: Diagnosis not present

## 2021-03-28 LAB — GLUCOSE, CAPILLARY
Glucose-Capillary: 134 mg/dL — ABNORMAL HIGH (ref 70–99)
Glucose-Capillary: 170 mg/dL — ABNORMAL HIGH (ref 70–99)
Glucose-Capillary: 174 mg/dL — ABNORMAL HIGH (ref 70–99)
Glucose-Capillary: 192 mg/dL — ABNORMAL HIGH (ref 70–99)
Glucose-Capillary: 205 mg/dL — ABNORMAL HIGH (ref 70–99)
Glucose-Capillary: 225 mg/dL — ABNORMAL HIGH (ref 70–99)
Glucose-Capillary: 78 mg/dL (ref 70–99)

## 2021-03-28 NOTE — TOC Progression Note (Addendum)
Transition of Care [facility]) - Progression Note    Patient Details  Name: MAYARI MATUS MRN: [id number] Date of Birth: 04/25/1951  Transition of Care [facility]) CM/SW Contact  Marlowe Sax, RN Phone Number: 03/28/2021, 10:33 AM  Clinical Narrative:   Received Notification of insurance approval to go to [facility], [id number], Notified Tonya at [facility],     Expected Discharge Plan: Long Term Nursing Home Barriers to Discharge: Continued Medical Work up  Expected Discharge Plan and Services Expected Discharge Plan: Long Term Nursing Home In-house Referral: Clinical Social Work   Post Acute Care Choice: Nursing Home Living arrangements for the past 2 months:  ([facility])                                       Social Determinants of Health (SDOH) Interventions    Readmission Risk Interventions No flowsheet data found.

## 2021-03-28 NOTE — Consult Note (Signed)
WOC consulted for "wounds", reviewed chart no wounds documented outside of some skin cracking and fissure.  Discussed with bedside nurse reported skin in good condition, no need for [facility] nurse consult  Tacarra Justo [facility], CNS, CWON-AP [phone]

## 2021-03-28 NOTE — Progress Notes (Signed)
SLP Cancellation Note  Patient Details Name: Erica Mueller MRN: [id number] DOB: [birth year]   Cancelled treatment:       Reason Eval/Treat Not Completed:  (chart reviewed; consulted NSG and the Dietician re: pt's status).  Per chart review and NSG/Dietician discussions, pt continues on full PEG TFs for nutrition/hydration; the dysphagia level 1 (puree) diet w/ Nectar liquids is also consumed daily TID meals but more for Pleasure at this time while pt has been in Acute care w/ ECT txs. No reports of aspiration have been noted/reported. NSG reported pt at ~50% of meals w/ Nectar liquids w/out swallowing deficits reported. On RA; afebrile. WBC on 03/22/21 was 4.2(normal). Pt requires support w/ po intake d/t Cognitive decline.    Due to pt's current Acute state and overall presentation w/ Cognitive decline Baseline, recommend continue w/ current diet plan of Primary PEG TFs for needs (as per Dietician) and Dysphagia diet of level 1(PUREE) w/ Nectar liquids for Pleasure; general aspiration precautions and support/Supervision at meals.  Pt can f/u w/ ST services for Dysphagia tx at next venue of care in hopes of continuing to safely upgrade her oral diet to least restrictive consistency -- this can be done most appropriately when pt has completed all ECT txs, and improved medically, and is back in her structured, known setting. NSG/CM updated       Jerilynn Som, MS, CCC-SLP Speech Language Pathologist Rehab Services [phone] [facility] 03/28/2021, 8:45 AM

## 2021-03-28 NOTE — Progress Notes (Signed)
Progress Note Erica Mueller   PIR:[id number]  DOB: January 24, 1951  DOA: 02/07/2021     49 Date of Service: 03/28/2021   Clinical Course 70 year old woman from [facility] PMH schizophrenia, admitted for severe sepsis thought initially secondary to UTI, but subsequently determined to be secondary to aspiration pneumonia.  Quickly transferred out of the ICU.  Early course complicated by UTI, acute kidney injury, all of which rapidly improved.  Toxic metabolic encephalopathy and failure to thrive persisted, resulting in the feeding tube placement secondary to cognitive impairment.  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 -- 10/18 discharge to SNF was planned, however PEG tube connector malfunctioned, after discussion with interventional radiology nursing, tube has been taped, tube feeds restarted and will monitor overnight.  If stable tomorrow without difficulty with PEG tube feeds, can discharge.  Consults Admitted by critical care Palliative medicine Psychiatry  Assessment and Plan * Sepsis (HCC)-resolved as of 03/28/2021 -- Secondary to aspiration pneumonia, resolved  Catatonia -- Resolved with ECT  Schizophrenia (HCC) -- With catatonic state earlier which has resolved with ECT. Remains awake, alert.   -- Per Dr. Toni Amend, no further ECT planned, he recommended outpatient follow-up in 2 weeks, continue Navane.  Anemia of chronic disease -- Stable.  Chronic combined systolic and diastolic CHF (congestive heart failure) (HCC) -- New diagnosis this admission.  Appears well compensated.  Conservative management at this point given overall clinical status.  If patient continues to improve, outpatient follow-up with cardiology. -- Continue beta-blocker, ACE inhibitor.  Appears euvolemic, no indication for diuretic presently.  Failure to thrive in adult -- Continue tube feeds but see above -- Due to pt's  current acute state and overall presentation w/ cognitive decline baseline, recommend continue w/ current diet plan of Primary PEG TFs for needs (as per Dietician) and Dysphagia diet of level 1(PUREE) w/ Nectar liquids for Pleasure; general aspiration precautions and support/Supervision at meals.  Pt can f/u w/ ST services for Dysphagia tx at next venue of care in hopes of continuing to safely upgrade her oral diet to least restrictive consistency -- this can be done most appropriately when pt has completed all ECT txs, and improved medically, and is back in her structured, known setting. NSG/CM updated  Protein-calorie malnutrition, severe -- Continue tube feeds.  May be able to wean off tube feeds if oral intake improves.  Stage II pressure ulcer (HCC) -- Resolved  Type 2 diabetes mellitus with hyperglycemia, without long-term current use of insulin (HCC) -- Stable, continue Amaryl, sliding scale insulin.  Emphysema (HCC) -- Asymptomatic.  No hypoxia.  Monitor clinically.  Aortic atherosclerosis (HCC) --continue statin  Ankle wound --resolved  Thrombocytopenia (HCC) --secondary to sepsis, resolved  Acute respiratory failure with hypoxia (HCC) -- Resolved.  Secondary to aspiration pneumonia.  Nonspecific complex cystic structure superficial soft tissues right axilla. --Nonspecific complex cystic structure in the superficial soft tissues of the right axilla likely benign cyst. Follow-up as outpatient.  Aspiration pneumonia (HCC) -- Resolved  COVID-19 virus infection -- Resolved  AKI (acute kidney injury) (HCC) -- Resolved  Acute metabolic encephalopathy -- Improved with ECT.  Further improvement will likely be quite slow.  Hypernatremia-resolved as of 03/28/2021 -- Resolved  Subjective:  Seems ok  Objective Vitals:   03/28/21 0030 03/28/21 0558 03/28/21 0715 03/28/21 1234  BP: 124/69 (!) 119/50 117/66 (!) 134/46  Pulse: 69 68 70 73  Resp: 16 16 15 15   Temp: 98 F  (  36.7 C) 97.6 F (36.4 C) 98.4 F (36.9 C) 98.2 F (36.8 C)  TempSrc: Oral Axillary    SpO2: 97% 97% 100% 100%  Weight:      Height:       47.5 kg  Vital signs were reviewed and unremarkable.  Exam Physical Exam Constitutional:      General: She is not in acute distress. Neurological:     Mental Status: She is alert.  Psychiatric:     Comments: Mood and affect unchanged    Labs / Other Information My review of labs, imaging, notes and other tests is significant for    CBG stable  Disposition Plan: Status is: Inpatient  Remains inpatient appropriate because: Need to ensure PEG tube is functioning properly prior to discharge.  Time spent: 20 minutes [facility] 03/28/2021, 4:10 PM

## 2021-03-29 DIAGNOSIS — R6889 Other general symptoms and signs: Secondary | ICD-10-CM | POA: Diagnosis not present

## 2021-03-29 DIAGNOSIS — K219 Gastro-esophageal reflux disease without esophagitis: Secondary | ICD-10-CM | POA: Diagnosis not present

## 2021-03-29 DIAGNOSIS — L723 Sebaceous cyst: Secondary | ICD-10-CM | POA: Diagnosis not present

## 2021-03-29 DIAGNOSIS — B351 Tinea unguium: Secondary | ICD-10-CM | POA: Diagnosis not present

## 2021-03-29 DIAGNOSIS — R5381 Other malaise: Secondary | ICD-10-CM | POA: Diagnosis not present

## 2021-03-29 DIAGNOSIS — U071 COVID-19: Secondary | ICD-10-CM | POA: Diagnosis not present

## 2021-03-29 DIAGNOSIS — D638 Anemia in other chronic diseases classified elsewhere: Secondary | ICD-10-CM | POA: Diagnosis not present

## 2021-03-29 DIAGNOSIS — I5042 Chronic combined systolic (congestive) and diastolic (congestive) heart failure: Secondary | ICD-10-CM | POA: Diagnosis not present

## 2021-03-29 DIAGNOSIS — N179 Acute kidney failure, unspecified: Secondary | ICD-10-CM | POA: Diagnosis not present

## 2021-03-29 DIAGNOSIS — I10 Essential (primary) hypertension: Secondary | ICD-10-CM | POA: Diagnosis not present

## 2021-03-29 DIAGNOSIS — Z23 Encounter for immunization: Secondary | ICD-10-CM | POA: Diagnosis not present

## 2021-03-29 DIAGNOSIS — Z743 Need for continuous supervision: Secondary | ICD-10-CM | POA: Diagnosis not present

## 2021-03-29 DIAGNOSIS — L603 Nail dystrophy: Secondary | ICD-10-CM | POA: Diagnosis not present

## 2021-03-29 DIAGNOSIS — J439 Emphysema, unspecified: Secondary | ICD-10-CM | POA: Diagnosis not present

## 2021-03-29 DIAGNOSIS — R404 Transient alteration of awareness: Secondary | ICD-10-CM | POA: Diagnosis not present

## 2021-03-29 DIAGNOSIS — L89152 Pressure ulcer of sacral region, stage 2: Secondary | ICD-10-CM | POA: Diagnosis not present

## 2021-03-29 DIAGNOSIS — R4182 Altered mental status, unspecified: Secondary | ICD-10-CM

## 2021-03-29 DIAGNOSIS — A419 Sepsis, unspecified organism: Secondary | ICD-10-CM | POA: Diagnosis not present

## 2021-03-29 DIAGNOSIS — R627 Adult failure to thrive: Secondary | ICD-10-CM | POA: Diagnosis not present

## 2021-03-29 DIAGNOSIS — Z1159 Encounter for screening for other viral diseases: Secondary | ICD-10-CM | POA: Diagnosis not present

## 2021-03-29 DIAGNOSIS — D696 Thrombocytopenia, unspecified: Secondary | ICD-10-CM | POA: Diagnosis not present

## 2021-03-29 DIAGNOSIS — Z7401 Bed confinement status: Secondary | ICD-10-CM | POA: Diagnosis not present

## 2021-03-29 DIAGNOSIS — E119 Type 2 diabetes mellitus without complications: Secondary | ICD-10-CM | POA: Diagnosis not present

## 2021-03-29 DIAGNOSIS — Z515 Encounter for palliative care: Secondary | ICD-10-CM | POA: Diagnosis not present

## 2021-03-29 DIAGNOSIS — I701 Atherosclerosis of renal artery: Secondary | ICD-10-CM | POA: Diagnosis not present

## 2021-03-29 DIAGNOSIS — J449 Chronic obstructive pulmonary disease, unspecified: Secondary | ICD-10-CM | POA: Diagnosis not present

## 2021-03-29 DIAGNOSIS — E785 Hyperlipidemia, unspecified: Secondary | ICD-10-CM | POA: Diagnosis not present

## 2021-03-29 DIAGNOSIS — Z741 Need for assistance with personal care: Secondary | ICD-10-CM | POA: Diagnosis not present

## 2021-03-29 DIAGNOSIS — M6281 Muscle weakness (generalized): Secondary | ICD-10-CM | POA: Diagnosis not present

## 2021-03-29 DIAGNOSIS — G9341 Metabolic encephalopathy: Secondary | ICD-10-CM | POA: Diagnosis not present

## 2021-03-29 DIAGNOSIS — T85598D Other mechanical complication of other gastrointestinal prosthetic devices, implants and grafts, subsequent encounter: Secondary | ICD-10-CM | POA: Diagnosis not present

## 2021-03-29 DIAGNOSIS — J9601 Acute respiratory failure with hypoxia: Secondary | ICD-10-CM | POA: Diagnosis not present

## 2021-03-29 DIAGNOSIS — E43 Unspecified severe protein-calorie malnutrition: Secondary | ICD-10-CM | POA: Diagnosis not present

## 2021-03-29 DIAGNOSIS — E1151 Type 2 diabetes mellitus with diabetic peripheral angiopathy without gangrene: Secondary | ICD-10-CM | POA: Diagnosis not present

## 2021-03-29 DIAGNOSIS — T85598A Other mechanical complication of other gastrointestinal prosthetic devices, implants and grafts, initial encounter: Secondary | ICD-10-CM

## 2021-03-29 DIAGNOSIS — R1312 Dysphagia, oropharyngeal phase: Secondary | ICD-10-CM | POA: Diagnosis not present

## 2021-03-29 DIAGNOSIS — I5040 Unspecified combined systolic (congestive) and diastolic (congestive) heart failure: Secondary | ICD-10-CM | POA: Diagnosis not present

## 2021-03-29 LAB — GLUCOSE, CAPILLARY
Glucose-Capillary: 233 mg/dL — ABNORMAL HIGH (ref 70–99)
Glucose-Capillary: 179 mg/dL — ABNORMAL HIGH (ref 70–99)
Glucose-Capillary: 205 mg/dL — ABNORMAL HIGH (ref 70–99)

## 2021-03-29 MED ORDER — LISINOPRIL 5 MG PO TABS
5.0000 mg | ORAL_TABLET | Freq: Every day | ORAL | Status: AC
Start: 2021-03-29 — End: ?

## 2021-03-29 MED ORDER — FREE WATER: 60.0000 mL | Status: AC

## 2021-03-29 MED ORDER — METOPROLOL TARTRATE 25 MG PO TABS: 12.5000 mg | ORAL_TABLET | Freq: Two times a day (BID) | ORAL | Status: DC

## 2021-03-29 MED ORDER — DOCUSATE SODIUM 100 MG PO CAPS: 100.0000 mg | ORAL_CAPSULE | Freq: Two times a day (BID) | ORAL | 0 refills | Status: DC | PRN

## 2021-03-29 MED ORDER — ACETAMINOPHEN 325 MG PO TABS: 650.0000 mg | ORAL_TABLET | ORAL | Status: DC | PRN

## 2021-03-29 MED ORDER — OSMOLITE 1.2 CAL PO LIQD: 1000.0000 mL | ORAL | 0 refills | Status: DC

## 2021-03-29 NOTE — Progress Notes (Signed)
Physical Therapy Treatment Patient Details Name: Erica Mueller MRN: [id number] DOB: 13-Jul-1950 Today's Date: 03/29/2021   History of Present Illness Pt is a 70 y/o F with PMH:  schizophrenia, HTN, GERD, type 2 diabetes mellitus,  depression and CAD. Pt presented on 02/07/21 d/t severe sepsis 2/2 COVID-19 infection. PEG tube placed 9/16 and tube changed 9/19. Pt has been catatonic. Phsychiatry started pt back on her baseline psych meds as well as ECT. Pt has become more interactive.    PT Comments    Patient alert, verbalized several times, able to answer some questions but unsure if accurate information. Pt often repeated what PT stated. With extended time and one step commands pt with improved ability to participate; good progress made with ambulation today. Supine to sit with minA, tactile and verbal cues to improve sitting balance. Sit <> stand with HHA, and then with RW. With RW pt able to transfer to recliner with minA due to posterior lean. She ambulated an additional 38ft with very close chair follow, RW and modA. Shuffled steps in a narrow base of support noted. Returned to chair, all needs in reach. The patient would benefit from further skilled PT intervention to continue to progress towards goals. Recommendation remains appropriate.       Recommendations for follow up therapy are one component of a multi-disciplinary discharge planning process, led by the attending physician.  Recommendations may be updated based on patient status, additional functional criteria and insurance authorization.  Follow Up Recommendations  SNF;Supervision for mobility/OOB;Supervision/Assistance - 24 hour     Equipment Recommendations  None recommended by PT    Recommendations for Other Services       Precautions / Restrictions Precautions Precautions: Fall Restrictions Weight Bearing Restrictions: No     Mobility  Bed Mobility Overal bed mobility: Needs Assistance Bed Mobility: Supine to  Sit     Supine to sit: HOB elevated Sit to supine: Min assist;Min guard;HOB elevated        Transfers Overall transfer level: Needs assistance Equipment used: 2 person hand held assist;Rolling walker (2 wheeled) Transfers: Sit to/from Stand Sit to Stand: +2 physical assistance;Max assist;From elevated surface;+2 safety/equipment            Ambulation/Gait Ambulation/Gait assistance: +2 safety/equipment;Mod assist Gait Distance (Feet): 10 Feet Assistive device: Rolling walker (2 wheeled)   Gait velocity: Decreased   General Gait Details: pt with significant posterior lean, but able to ambulate ~21ft with very short, shuffled steps in a narrow base of support.   Stairs             Wheelchair Mobility    Modified Rankin (Stroke Patients Only)       Balance Overall balance assessment: Needs assistance Sitting-balance support: Feet supported;Single extremity supported Sitting balance-Leahy Scale: Fair Sitting balance - Comments: posterior lean intermittently improved with cues and feet support cues Postural control: Posterior lean Standing balance support: Bilateral upper extremity supported Standing balance-Leahy Scale: Poor Standing balance comment: one instance of being able to maintain standing balance with light minA, remainder of session pt with significant posterior lean                            Cognition Arousal/Alertness: Awake/alert Behavior During Therapy: WFL for tasks assessed/performed Overall Cognitive Status: History of cognitive impairments - at baseline Area of Impairment: Orientation;Memory;Safety/judgement;Problem solving;Following commands                 Orientation Level:  Disoriented to;Place;Time;Situation;Person   Memory: Decreased short-term memory Following Commands: Follows one step commands with increased time;Follows one step commands inconsistently Safety/Judgement: Decreased awareness of safety;Decreased  awareness of deficits Awareness: Intellectual Problem Solving: Difficulty sequencing;Decreased initiation;Requires verbal cues;Requires tactile cues;Slow processing        Exercises      General Comments        Pertinent Vitals/Pain Pain Assessment: Faces Faces Pain Scale: No hurt Pain Intervention(s): Monitored during session;Repositioned    Home Living                      Prior Function            PT Goals (current goals can now be found in the care plan section) Progress towards PT goals: Progressing toward goals    Frequency    Min 2X/week      PT Plan Current plan remains appropriate    Co-evaluation              AM-PAC PT "6 Clicks" Mobility   Outcome Measure  Help needed turning from your back to your side while in a flat bed without using bedrails?: A Little Help needed moving from lying on your back to sitting on the side of a flat bed without using bedrails?: A Lot Help needed moving to and from a bed to a chair (including a wheelchair)?: A Lot Help needed standing up from a chair using your arms (e.g., wheelchair or bedside chair)?: A Lot Help needed to walk in hospital room?: A Lot Help needed climbing 3-5 steps with a railing? : Total 6 Click Score: 12    End of Session Equipment Utilized During Treatment: Gait belt Activity Tolerance: Patient tolerated treatment well Patient left: in chair;with call bell/phone within reach;with chair alarm set;Other (comment) Nurse Communication: Mobility status PT Visit Diagnosis: Unsteadiness on feet (R26.81);Other abnormalities of gait and mobility (R26.89);Muscle weakness (generalized) (M62.81);Difficulty in walking, not elsewhere classified (R26.2)     Time: 5329-9242 PT Time Calculation (min) (ACUTE ONLY): 20 min  Charges:  $Therapeutic Activity: 8-22 mins                    Olga Coaster PT, DPT 9:03 AM,03/29/21

## 2021-03-29 NOTE — TOC Progression Note (Addendum)
Transition of Care [facility]) - Progression Note    Patient Details  Name: KALIANNA VERBEKE MRN: [id number] Date of Birth: 1950-11-29  Transition of Care [facility]) CM/SW Contact  Marlowe Sax, RN Phone Number: 03/29/2021, 1:47 PM  Clinical Narrative:   Spoke to the patient's son and provided him with the information that the patient will be going to room 51 B EMS will transport, he stated understanding, The bedside nurse attempted multiple times to call report without success, I notified Tonya at [facility] and provided The Bedside Nurse phone number so they may call her for report.EMS has been called and she is next on the list to pick up, notified the bedside nurse  I notified Tonya that I have contacted EMS to transport, [place] replied with "ok"    Expected Discharge Plan: Long Term Nursing Home Barriers to Discharge: Continued Medical Work up  Expected Discharge Plan and Services Expected Discharge Plan: Long Term Nursing Home In-house Referral: Clinical Social Work   Post Acute Care Choice: Nursing Home Living arrangements for the past 2 months:  [facility]) Expected Discharge Date: 03/29/21                                     Social Determinants of Health (SDOH) Interventions    Readmission Risk Interventions No flowsheet data found.

## 2021-03-29 NOTE — Progress Notes (Signed)
I attempted to call report to the facility. I waited on the phone for 15 mins without being able to get in touch with a nurse. RN CM aware and will give the facility my number so they can call me to get report.

## 2021-03-29 NOTE — Discharge Summary (Signed)
Physician Discharge Summary  Erica Mueller DPT:[id number] DOB: 1951/02/18 DOA: 02/07/2021  PCP: [facility]  Admit date: 02/07/2021 Discharge date: 03/29/2021  Admitted From: [facility] Disposition: SNF  Recommendations for Outpatient Follow-up:  Follow up with PCP in 1-2 weeks Follow-up with cardiology Follow-up with psychiatry Follow-up with speech and swallow therapy Please obtain BMP/CBC in one week Please follow up on the following pending results: None  Home Health: No Equipment/Devices: Discharge Condition: Stable CODE STATUS: Full Diet recommendation:  Dysphagia   Brief/Interim Summary: 70 year old woman from [facility] PMH schizophrenia, admitted for severe sepsis thought initially secondary to UTI, but subsequently determined to be secondary to aspiration pneumonia.  Quickly transferred out of the ICU.  Early course complicated by UTI, acute kidney injury, all of which rapidly improved.  Toxic metabolic encephalopathy and failure to thrive persisted, resulting in the feeding tube placement secondary to cognitive impairment.  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 Psychiatry is recommending outpatient follow-up for further management.  Patient was also found to have a new diagnosis of combined systolic and diastolic heart failure.  Remained stable and appears euvolemic.  Cardiology was consulted and she needs to follow-up with cardiology as an outpatient.  We will continue beta-blocker, ACE inhibitor.  Due to failure to thrive and poor p.o. intake, PEG tube was inserted and she was started on tube feed. Dietitian was consulted and they were recommending continue with tube feed along with dysphagia 1 (PUREE) w/ Nectar liquids for Pleasure; general aspiration precautions and support/Supervision at meals.  Patient is currently on continuous feed at 60 mL/h.  Dietitian at rehab  can transition him to bolus or nightly feeds if her weight remains stable.  Pt can f/u w/ ST services for Dysphagia tx at next venue of care in hopes of continuing to safely upgrade her oral diet to least restrictive consistency -- this can be done most appropriately when pt has completed all ECT txs, and improved medically, and is back in her structured, known setting. NSG/CM updated.  Patient also has an history of emphysema, no hypoxia and will need outpatient monitoring.  Patient developed stage II pressure ulcers and an ankle wound which has been resolved.  Patient has a nonspecific complex cystic structure in the superficial soft tissue of right axilla, likely benign cyst which can be followed up as an outpatient.  Her hospital course was complicated with COVID-19 virus infection, AKI and hyponatremia which has been resolved.  Patient will continue with current management and follow-up with her providers.  Discharge Diagnoses:  Active Problems:   Type 2 diabetes mellitus with hyperglycemia, without long-term current use of insulin (HCC)   Schizophrenia (HCC)   Acute metabolic encephalopathy   Stage II pressure ulcer (HCC)   Protein-calorie malnutrition, severe   Failure to thrive in adult   Catatonia   AKI (acute kidney injury) (HCC)   COVID-19 virus infection   Ankle wound   Aspiration pneumonia (HCC)   Chronic combined systolic and diastolic CHF (congestive heart failure) (HCC)   Anemia of chronic disease   Aortic atherosclerosis (HCC)   Emphysema (HCC)   Nonspecific complex cystic structure superficial soft tissues right axilla.   Acute respiratory failure with hypoxia (HCC)   Thrombocytopenia (HCC)   Altered mental status   Feeding tube dysfunction   Discharge Instructions  Discharge Instructions     Diet - low sodium heart healthy   Complete by: As directed  Increase activity slowly   Complete by: As directed    No dressing needed   Complete by: As directed        Allergies as of 03/29/2021   No Known Allergies      Medication List     STOP taking these medications    amitriptyline 25 MG tablet Commonly known as: ELAVIL   chlorproMAZINE 25 MG tablet Commonly known as: THORAZINE   paliperidone 6 MG 24 hr tablet Commonly known as: INVEGA       TAKE these medications    acetaminophen 325 MG tablet Commonly known as: TYLENOL Take 2 tablets (650 mg total) by mouth every 4 (four) hours as needed for mild pain (temp > 101.5).   cholecalciferol 25 MCG (1000 UNIT) tablet Commonly known as: VITAMIN D3 Take 1,000 Units by mouth daily.   docusate sodium 100 MG capsule Commonly known as: COLACE Take 1 capsule (100 mg total) by mouth 2 (two) times daily as needed for mild constipation.   feeding supplement (OSMOLITE 1.2 CAL) Liqd Place 1,000 mLs into feeding tube continuous.   free water Soln Place 60 mLs into feeding tube every 4 (four) hours.   glimepiride 2 MG tablet Commonly known as: AMARYL Take 2 mg by mouth daily.   lisinopril 5 MG tablet Commonly known as: ZESTRIL Place 1 tablet (5 mg total) into feeding tube daily.   lovastatin 40 MG tablet Commonly known as: MEVACOR Take 1 tablet (40 mg total) by mouth at bedtime.   metFORMIN 1000 MG tablet Commonly known as: GLUCOPHAGE Take 1,000 mg by mouth 2 (two) times daily.   metoprolol tartrate 25 MG tablet Commonly known as: LOPRESSOR Take 0.5 tablets (12.5 mg total) by mouth 2 (two) times daily. What changed: how much to take   polyethylene glycol 17 g packet Commonly known as: MIRALAX / GLYCOLAX Take 17 g by mouth daily.   senna-docusate 8.6-50 MG tablet Commonly known as: Senokot-S Take 1 tablet by mouth 2 (two) times daily. (0800 and 1600) What changed: Another medication with the same name was removed. Continue taking this medication, and follow the directions you see here.   thiothixene 2 MG capsule Commonly known as: NAVANE Take 1 capsule (2 mg  total) by mouth 2 (two) times daily.   vitamin C 500 MG tablet Commonly known as: ASCORBIC ACID Take 500 mg by mouth 2 (two) times daily.   zinc gluconate 50 MG tablet Take 50 mg by mouth daily.               Discharge Care Instructions  (From admission, onward)           Start     Ordered   03/29/21 0000  No dressing needed        03/29/21 1123            Contact information for follow-up providers     [facility] Schedule an appointment as soon as possible for a visit in 1 week(s).   Contact information: [address] [place] [address] [phone]              Contact information for after-discharge care     Destination     [facility] Preferred SNF .   Service: Skilled Nursing Contact information: [address] [place] [address] [phone]                    No Known Allergies  Consultations: PCCM  Palliative care Psychiatry  Procedures/Studies: DG Abd 1 View  Result Date: 03/28/2021 CLINICAL DATA:  Feeding tube dysfunction. EXAM: ABDOMEN - 1 VIEW COMPARISON:  One-view abdomen 02/16/2021 FINDINGS: Heart size normal. Lung volumes scratched at the lung bases are clear. Elevation of the right hemidiaphragm stable. Peg tube is in place. Bowel gas pattern is unremarkable. IMPRESSION: Gastrostomy tube in place. No acute abnormality. Electronically Signed   By: Marin Roberts M.D.   On: 03/28/2021 11:29   IR Replc Gastro/Colonic Tube Percut W/Fluoro  Result Date: 02/28/2021 INDICATION: Cracked gastrostomy tube. Please perform fluoroscopic guided exchange. EXAM: FLUOROSCOPIC GUIDED REPLACEMENT OF GASTROSTOMY TUBE COMPARISON:  Image guided percutaneous gastrostomy tube placement-02/24/2021 MEDICATIONS: None. CONTRAST:  76mL OMNIPAQUE IOHEXOL 300 MG/ML SOLN - administered into the gastric lumen FLUOROSCOPY TIME:  24 seconds (1.1 mGy) COMPLICATIONS: None immediate. PROCEDURE: A  timeout was performed prior to the initiation of the procedure. On physical inspection, the external portion of the gastrostomy tube was indeed split causing leakage during attempted percutaneous feedings. As such, the decision was made to proceed with fluoroscopic guided exchange. The existing gastrostomy tube was injected with a small amount contrast demonstrated appropriate position functionality The external portion of the gastrostomy tube was cut and cannulated with a short Amplatz wire which was coiled within the gastric lumen. Under intermittent fluoroscopic guidance, the existing gastrostomy tube was exchanged for a new 18 French balloon retention gastrostomy tube. The retention balloon was inflated with 10 cc of saline and a minimal amount of dilute contrast, pulled against the inner wall of the gastric lumen and the external disc was cinched. Postprocedural injection demonstrates appropriate position functionality of the gastrostomy tube. A dressing was applied. The patient tolerated the procedure well without immediate postprocedural complication. IMPRESSION: Successful fluoroscopic guided replacement of a new 18-French gastrostomy tube. The gastrostomy tube is ready for immediate use. Electronically Signed   By: Simonne Come M.D.   On: 02/28/2021 08:15   [place] RT UPPER EXTREM LTD SOFT TISSUE NON VASCULAR  Result Date: 03/25/2021 CLINICAL DATA:  Reassess right axillary cyst EXAM: ULTRASOUND RIGHT UPPER EXTREMITY LIMITED TECHNIQUE: Ultrasound examination of the upper extremity soft tissues was performed in the area of clinical concern. COMPARISON:  CT 02/07/2021, MRI 02/17/2021 FINDINGS: Targeted ultrasound was performed of the soft tissues of the right axilla at site of patient's clinical concern. Redemonstrated complex cystic structure within the superficial soft tissues measuring approximately 2.2 x 0.8 x 1.4 cm with posterior acoustic enhancement. Internal areas of hypoechogenicity without internal  vascularity on color Doppler. This lesion previously measured approximately 2.5 x 0.7 x 1.5 cm on 02/17/2021 and appearance is similar. IMPRESSION: Similar size and appearance of 2.2 cm complex cystic lesion within the superficial soft tissues of the right axilla. Appearance is most suggestive of a complex sebaceous or epidermoid cyst. Continued clinical follow-up is recommended. Electronically Signed   By: Duanne Guess D.O.   On: 03/25/2021 19:49    Subjective: Patient was seen and examined today.  Sitting comfortably in chair.  Did not spoke with me but did say no when asked about pain.  Appears at baseline.  Discharge Exam: Vitals:   03/29/21 0736 03/29/21 1049  BP: (!) 146/52   Pulse: 74   Resp: 13   Temp:  98.4 F (36.9 C)  SpO2: 99%    Vitals:   03/28/21 2145 03/29/21 0500 03/29/21 0736 03/29/21 1049  BP: (!) 127/59 140/67 (!) 146/52   Pulse: 80 74 74   Resp: 16  13  Temp: 97.6 F (36.4 C) 97.8 F (36.6 C)  98.4 F (36.9 C)  TempSrc:    Oral  SpO2: 96% 98% 99%   Weight:      Height:        General: Pt is alert, awake, not in acute distress Cardiovascular: RRR, S1/S2 +, no rubs, no gallops Respiratory: CTA bilaterally, no wheezing, no rhonchi Abdominal: Soft, NT, ND, bowel sounds +, PEG tube in place Extremities: no edema, no cyanosis   The results of significant diagnostics from this hospitalization (including imaging, microbiology, ancillary and laboratory) are listed below for reference.    Microbiology: Recent Results (from the past 240 hour(s))  Resp Panel by RT-PCR (Flu A&B, Covid) Nasopharyngeal Swab     Status: None   Collection Time: 03/27/21  8:20 PM   Specimen: Nasopharyngeal Swab; Nasopharyngeal(NP) swabs in vial transport medium  Result Value Ref Range Status   SARS Coronavirus 2 by RT PCR NEGATIVE NEGATIVE Final    Comment: (NOTE) SARS-CoV-2 target nucleic acids are NOT DETECTED.  The SARS-CoV-2 RNA is generally detectable in upper  respiratory specimens during the acute phase of infection. The lowest concentration of SARS-CoV-2 viral copies this assay can detect is 138 copies/mL. A negative result does not preclude SARS-Cov-2 infection and should not be used as the sole basis for treatment or other patient management decisions. A negative result may occur with  improper specimen collection/handling, submission of specimen other than nasopharyngeal swab, presence of viral mutation(s) within the areas targeted by this assay, and inadequate number of viral copies(<138 copies/mL). A negative result must be combined with clinical observations, patient history, and epidemiological information. The expected result is Negative.  Fact Sheet for Patients:  BloggerCourse.com  Fact Sheet for Healthcare Providers:  SeriousBroker.it  This test is no t yet approved or cleared by the [place] FDA and  has been authorized for detection and/or diagnosis of SARS-CoV-2 by FDA under an Emergency Use Authorization (EUA). This EUA will remain  in effect (meaning this test can be used) for the duration of the COVID-19 declaration under Section 564(b)(1) of the Act, 21 U.S.C.section 360bbb-3(b)(1), unless the authorization is terminated  or revoked sooner.       Influenza A by PCR NEGATIVE NEGATIVE Final   Influenza B by PCR NEGATIVE NEGATIVE Final    Comment: (NOTE) The Xpert Xpress SARS-CoV-2/FLU/RSV plus assay is intended as an aid in the diagnosis of influenza from Nasopharyngeal swab specimens and should not be used as a sole basis for treatment. Nasal washings and aspirates are unacceptable for Xpert Xpress SARS-CoV-2/FLU/RSV testing.  Fact Sheet for Patients: BloggerCourse.com  Fact Sheet for Healthcare Providers: SeriousBroker.it  This test is not yet approved or cleared by the [place] FDA and has been  authorized for detection and/or diagnosis of SARS-CoV-2 by FDA under an Emergency Use Authorization (EUA). This EUA will remain in effect (meaning this test can be used) for the duration of the COVID-19 declaration under Section 564(b)(1) of the Act, 21 U.S.C. section 360bbb-3(b)(1), unless the authorization is terminated or revoked.  Performed at [facility], [address] [place], [place] [address]      Labs: BNP (last 3 results) No results for input(s): BNP in the last 8760 hours. Basic Metabolic Panel: No results for input(s): NA, K, CL, CO2, GLUCOSE, BUN, CREATININE, CALCIUM, MG, PHOS in the last 168 hours. Liver Function Tests: No results for input(s): AST, ALT, ALKPHOS, BILITOT, PROT, ALBUMIN in the last 168 hours. No results  for input(s): LIPASE, AMYLASE in the last 168 hours. No results for input(s): AMMONIA in the last 168 hours. CBC: No results for input(s): WBC, NEUTROABS, HGB, HCT, MCV, PLT in the last 168 hours. Cardiac Enzymes: No results for input(s): CKTOTAL, CKMB, CKMBINDEX, TROPONINI in the last 168 hours. BNP: Invalid input(s): POCBNP CBG: Recent Labs  Lab 03/27/21 2013 03/28/21 0015 03/28/21 0028 03/28/21 0418 03/28/21 0718  GLUCAP 78 170* 225* 174* 134*   D-Dimer No results for input(s): DDIMER in the last 72 hours. Hgb A1c No results for input(s): HGBA1C in the last 72 hours. Lipid Profile No results for input(s): CHOL, HDL, LDLCALC, TRIG, CHOLHDL, LDLDIRECT in the last 72 hours. Thyroid function studies No results for input(s): TSH, T4TOTAL, T3FREE, THYROIDAB in the last 72 hours.  Invalid input(s): FREET3 Anemia work up No results for input(s): VITAMINB12, FOLATE, FERRITIN, TIBC, IRON, RETICCTPCT in the last 72 hours. Urinalysis    Component Value Date/Time   COLORURINE YELLOW (A) 02/07/2021 1650   APPEARANCEUR CLOUDY (A) 02/07/2021 1650   LABSPEC 1.026 02/07/2021 1650   PHURINE 5.0 02/07/2021 1650   GLUCOSEU 50 (A)  02/07/2021 1650   HGBUR MODERATE (A) 02/07/2021 1650   BILIRUBINUR NEGATIVE 02/07/2021 1650   KETONESUR 5 (A) 02/07/2021 1650   PROTEINUR 30 (A) 02/07/2021 1650   NITRITE NEGATIVE 02/07/2021 1650   LEUKOCYTESUR MODERATE (A) 02/07/2021 1650   Sepsis Labs Invalid input(s): PROCALCITONIN,  WBC,  LACTICIDVEN Microbiology Recent Results (from the past 240 hour(s))  Resp Panel by RT-PCR (Flu A&B, Covid) Nasopharyngeal Swab     Status: None   Collection Time: 03/27/21  8:20 PM   Specimen: Nasopharyngeal Swab; Nasopharyngeal(NP) swabs in vial transport medium  Result Value Ref Range Status   SARS Coronavirus 2 by RT PCR NEGATIVE NEGATIVE Final    Comment: (NOTE) SARS-CoV-2 target nucleic acids are NOT DETECTED.  The SARS-CoV-2 RNA is generally detectable in upper respiratory specimens during the acute phase of infection. The lowest concentration of SARS-CoV-2 viral copies this assay can detect is 138 copies/mL. A negative result does not preclude SARS-Cov-2 infection and should not be used as the sole basis for treatment or other patient management decisions. A negative result may occur with  improper specimen collection/handling, submission of specimen other than nasopharyngeal swab, presence of viral mutation(s) within the areas targeted by this assay, and inadequate number of viral copies(<138 copies/mL). A negative result must be combined with clinical observations, patient history, and epidemiological information. The expected result is Negative.  Fact Sheet for Patients:  BloggerCourse.com  Fact Sheet for Healthcare Providers:  SeriousBroker.it  This test is no t yet approved or cleared by the [place] FDA and  has been authorized for detection and/or diagnosis of SARS-CoV-2 by FDA under an Emergency Use Authorization (EUA). This EUA will remain  in effect (meaning this test can be used) for the duration of the COVID-19  declaration under Section 564(b)(1) of the Act, 21 U.S.C.section 360bbb-3(b)(1), unless the authorization is terminated  or revoked sooner.       Influenza A by PCR NEGATIVE NEGATIVE Final   Influenza B by PCR NEGATIVE NEGATIVE Final    Comment: (NOTE) The Xpert Xpress SARS-CoV-2/FLU/RSV plus assay is intended as an aid in the diagnosis of influenza from Nasopharyngeal swab specimens and should not be used as a sole basis for treatment. Nasal washings and aspirates are unacceptable for Xpert Xpress SARS-CoV-2/FLU/RSV testing.  Fact Sheet for Patients: BloggerCourse.com  Fact Sheet for Healthcare Providers: SeriousBroker.it  This test is not yet approved or cleared by the [place] and has been authorized for detection and/or diagnosis of SARS-CoV-2 by FDA under an Emergency Use Authorization (EUA). This EUA will remain in effect (meaning this test can be used) for the duration of the COVID-19 declaration under Section 564(b)(1) of the Act, 21 U.S.C. section 360bbb-3(b)(1), unless the authorization is terminated or revoked.  Performed at [facility], [address] [place], [place] [address]     Time coordinating discharge: Over 30 minutes  SIGNED:  Arnetha Courser, MD  [facility] 03/29/2021, 11:25 AM  If 7PM-7AM, please contact night-coverage [email or web address]  This record has been created using Conservation officer, historic buildings. Errors have been sought and corrected,but may not always be located. Such creation errors do not reflect on the standard of care.

## 2021-03-29 NOTE — Care Management Important Message (Signed)
Important Message  Patient Details  Name: Erica Mueller MRN: [id number] Date of Birth: Oct 24, 1950   Medicare Important Message Given:  Yes  I reviewed the Important Message from Medicare with the patient's son, Erica Mueller by phone [phone]). I asked if he would like a copy and he replied yes. I sent a copy via secure e-mail to [email or web address] as requested and thanked him for his time.   [name] 03/29/2021, 12:01 PM

## 2021-03-30 LAB — GLUCOSE, CAPILLARY
Glucose-Capillary: 139 mg/dL — ABNORMAL HIGH (ref 70–99)
Glucose-Capillary: 140 mg/dL — ABNORMAL HIGH (ref 70–99)
Glucose-Capillary: 175 mg/dL — ABNORMAL HIGH (ref 70–99)
Glucose-Capillary: 215 mg/dL — ABNORMAL HIGH (ref 70–99)

## 2021-04-03 ENCOUNTER — Non-Acute Institutional Stay: Payer: Medicare Other | Admitting: Nurse Practitioner

## 2021-04-03 ENCOUNTER — Other Ambulatory Visit: Payer: Self-pay

## 2021-04-03 ENCOUNTER — Encounter: Payer: Self-pay | Admitting: Nurse Practitioner

## 2021-04-03 VITALS — BP 133/55 | HR 70 | Temp 97.4°F | Resp 18 | Wt 104.0 lb

## 2021-04-03 DIAGNOSIS — R630 Anorexia: Secondary | ICD-10-CM

## 2021-04-03 DIAGNOSIS — Z515 Encounter for palliative care: Secondary | ICD-10-CM | POA: Diagnosis not present

## 2021-04-03 DIAGNOSIS — R5381 Other malaise: Secondary | ICD-10-CM

## 2021-04-03 NOTE — Progress Notes (Signed)
[place] Consult Note Telephone: [phone]  Fax: [phone]    Date of encounter: 04/03/21 4:25 PM PATIENT NAME: Erica Mueller [address] Erica [place] [address]   [phone] (home)  DOB: 1950-12-26 MRN: [id number] PRIMARY CARE PROVIDER:    [facility]  RESPONSIBLE PARTY:    Contact Information     Name Relation Home Work Mobile   [place] Son   [phone]   [facility] Brother   [phone]   Drinda Butts [phone]     Alaijah, [place] Granddaughter   [phone]      I met face to face with patient in facility. Palliative Care was asked to follow this patient by consultation request of  Dr Nicole Kindred to address advance care planning and complex medical decision making. This is a follow up visit.                                  ASSESSMENT AND PLAN / RECOMMENDATIONS: Symptom Management/Plan: 1. Advance Care Planning; Full code pending further discussions with family for clarification of medical goals   2. Goals of Care: Goals include to maximize quality of life and symptom management. Our advance care planning conversation included a discussion about:    The value and importance of advance care planning  Exploration of personal, cultural or spiritual beliefs that might influence medical decisions  Exploration of goals of care in the event of a sudden injury or illness  Identification and preparation of a healthcare agent  Review and updating or creation of an advance directive document.   3. Palliative care encounter; Palliative care encounter; Palliative medicine team will continue to support patient, patient's family, and medical team. Visit consisted of counseling and education dealing with the complex and emotionally intense issues of symptom management and palliative care in the setting of serious and potentially life-threatening illness   4. f/u 4 weeks for ongoing monitoring chronic disease  progression, ongoing discussions complex medical decision making   5. Anorexia secondary to schizophrenia, depression; continue to encourage to eat, weights; receives tube feedings with Regular diet, Level 4 - Pureed texture, Level 2 - Mildly Thick consistency.   6. Debility secondary to CAD, Schizophrenia; continue to encourage Erica Mueller participation in therapy, encouraged staff to get Erica Mueller oob, activities as able to participate, emotional support   Follow up Palliative Care Visit: Palliative care will continue to follow for complex medical decision making, advance care planning, and clarification of goals. Return 4 weeks or prn.  I spent 37 minutes providing this consultation start at 3:00pm. More than 50% of the time in this consultation was spent in counseling and care coordination.  PPS: 30%  Chief Complaint: Follow up palliative consult for complex medical decision making  HISTORY OF PRESENT ILLNESS:  Erica Mueller is a 70 y.o. year old female  with multiple medical problems including CAD, DM, HTN, GERD, syphilis, elevated lips, schizophrenia, depression. Erica Mueller was cleared and returned to [facility] where she currently resides. Erica Mueller does require assistance with bathing, dressing prompting; toileting. Erica Mueller is ambulatory. Erica Mueller does feed herself with varied appetite. Current weight 104 lbs with BMI 18.4 with 5.6 lb weight loss. Erica Mueller receives tube feedings with Regular diet, Level 4 - Pureed texture, Level 2 - Mildly Thick consistency. 03/31/2021 fall, lost balance, no injury. Staff endorses no other changes. Erica Mueller is a full  code. At present, Erica Mueller is lying in bed, sleeping. Erica Mueller awoke to verbal cues, made eye contact. Limited discussion with no verbal response to questions by Erica Mueller. She did make eye contact, follow commands. Erica Mueller per therapy endorses she is able to stand with assistance. Erica Mueller was cooperative with assessment. Emotional support provided. I have  attempted to contact son. Medical goals reviewed. I updated staff. Will continue to try to reach son for discussions of medical goals, code status.  01/25/2021 sodium 145, potassium 4.0, chloride 104, CO226, calcium 9.2, BUN 17.7, creatinine 0.59, glucose 152, total protein 7.4, albumin 4.1, ALT 18, AST 32, WBC 3.6, hemoglobin 12.7, hematocrit 39.4, platelets 221  History obtained from review of EMR, discussion with facility and  Erica Mueller.  I reviewed available labs, medications, imaging, studies and related documents from the EMR.  Records reviewed and summarized above.   ROS Full 10 system review of systems performed and negative with exception of: as per HPI.   Physical Exam: Current and past weights: Constitutional: NAD General: frail appearing, thin, debilitated, chronically ill patient  EYES:  lids intact ENMT: oral mucous membranes moist CV: S1S2, RRR Pulmonary: decrease bases, no increased work of breathing, no cough Abdomen: normo-active BS + 4 quadrants, soft and non tender; g-tube MSK: bed-bound, functionally quadriplegic, muscle wasting Skin: warm and dry, no rashes or wounds on visible skin Neuro:  + generalized weakness,  + cognitive impairment Psych: non-anxious affect, A and O x 3  Questions and concerns were addressed. Provided general support and encouragement, no other unmet needs identified   Thank you for the opportunity to participate in the care of Erica Mueller.  The palliative care team will continue to follow. Please call our office at [phone] if we can be of additional assistance.   This chart was dictated using voice recognition software.  Despite best efforts to proofread,  errors can occur which can change the documentation meaning.   Brittin Janik Ihor Gully, NP

## 2021-04-06 DIAGNOSIS — R627 Adult failure to thrive: Secondary | ICD-10-CM | POA: Diagnosis not present

## 2021-04-06 DIAGNOSIS — E43 Unspecified severe protein-calorie malnutrition: Secondary | ICD-10-CM | POA: Diagnosis not present

## 2021-04-06 DIAGNOSIS — J449 Chronic obstructive pulmonary disease, unspecified: Secondary | ICD-10-CM | POA: Diagnosis not present

## 2021-04-06 DIAGNOSIS — I5040 Unspecified combined systolic (congestive) and diastolic (congestive) heart failure: Secondary | ICD-10-CM | POA: Diagnosis not present

## 2021-04-11 DIAGNOSIS — I5020 Unspecified systolic (congestive) heart failure: Secondary | ICD-10-CM | POA: Diagnosis not present

## 2021-04-24 ENCOUNTER — Other Ambulatory Visit: Payer: Self-pay

## 2021-04-24 ENCOUNTER — Encounter: Payer: Self-pay | Admitting: Nurse Practitioner

## 2021-04-24 ENCOUNTER — Non-Acute Institutional Stay: Payer: Medicare Other | Admitting: Nurse Practitioner

## 2021-04-24 VITALS — BP 140/72 | HR 88 | Temp 98.1°F | Resp 18 | Wt 102.0 lb

## 2021-04-24 DIAGNOSIS — R630 Anorexia: Secondary | ICD-10-CM

## 2021-04-24 DIAGNOSIS — R5381 Other malaise: Secondary | ICD-10-CM

## 2021-04-24 DIAGNOSIS — Z515 Encounter for palliative care: Secondary | ICD-10-CM

## 2021-04-24 DIAGNOSIS — R0602 Shortness of breath: Secondary | ICD-10-CM

## 2021-04-24 NOTE — Progress Notes (Signed)
[place] Consult Note Telephone: [phone]  Fax: [phone]    Date of encounter: 04/24/21 2:14 PM PATIENT NAME: Erica Mueller [address] [place] [address]   [phone] (home)  DOB: [birth year] MRN: [id number] PRIMARY CARE PROVIDER:    Dr [facility]  RESPONSIBLE PARTY:    Contact Information     Name Relation Home Work Mobile   [place] Son   [phone]   [facility] Brother   [phone]   Drinda Butts [phone]     Sissi, Padia Granddaughter   [phone]      I met face to face with patient in facility. Palliative Care was asked to follow this patient by consultation request of  Dr Nicole Kindred to address advance care planning and complex medical decision making. This is a follow up visit.                                  ASSESSMENT AND PLAN / RECOMMENDATIONS:  Symptom Management/Plan: 1. Advance Care Planning; Full code pending further discussions with family for clarification of medical goals   2. Shortness of breath secondary to CHF, currently stable. Continue to monitor respiratory symptoms, continue weights, monitoring edema. Continue outpatient cardiology appointments. Currently not on diuretic  3. Anorexia; possible 5.3 lb weight loss x1 week. Re-weigh requested to verify current weight. Patient is eating varying PO intake. Receiving bolus TF BID, recently decreased from five times daily to promote increase PO intake. Continue to monitor weights, encourage to eat, emotional support    4. Palliative care encounter; Palliative care encounter; Palliative medicine team will continue to support patient, patient's family, and medical team. Visit consisted of counseling and education dealing with the complex and emotionally intense issues of symptom management and palliative care in the setting of serious and potentially life-threatening illness   5. f/u 4 weeks for ongoing monitoring chronic  disease progression, ongoing discussions complex medical decision making  Follow up Palliative Care Visit: Palliative care will continue to follow for complex medical decision making, advance care planning, and clarification of goals. Return 4 weeks or prn.  I spent 38 minutes providing this consultation. More than 50% of the time in this consultation was spent in counseling and care coordination. PPS: 40%  HOSPICE ELIGIBILITY/DIAGNOSIS: TBD  Chief Complaint: Follow up palliative consult for complex medical decision making  HISTORY OF PRESENT ILLNESS:  Erica Mueller is a 70 y.o. year old female  with  multiple medical problems including CAD, DM, HTN, GERD, syphilis, schizophrenia, depression. Ms. Bernardini resides at SNF at [facility]. Erica Mueller does require assistance with bathing, dressing prompting; toileting. Erica Mueller is fed through tube feedings essential to sustain life, recently started on a diet.  Erica Mueller noted with possible 5.3 lb weight loss x1 week. Re-weigh requested to verify current weight. Patient is eating varying PO intake. Receiving bolus TF BID, recently decreased from five times daily to promote increased PO intake. Will continue to monitor and intervene PRN. 04/18/2021 care plan meeting held with Erica Mueller advocating for herself. Erica Mueller had recent hospitalized 02/07/2021 to 03/29/2021 for severe sepsis thought initially secondary to UTI, but subsequently determined to be secondary to aspiration pneumonia.  Toxic metabolic encephalopathy and failure to thrive persisted, resulting in the feeding tube placement secondary to cognitive impairment.  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  Psychiatry is recommending outpatient follow-up for further management. 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 Followed at Cardiology 04/11/2021 for Heart failure with reduced EF with plan to  continue current therapy, repeat echo 2 to 3 months as no s/s CHF and not suitable to undergo stress testing or other ischemic evaluation due to inability to participate in the test. Echo- 01/2021- 1. Left ventricular ejection fraction, by estimation, is 30 to 35%. The left ventricle has moderate to severely decreased function. The left ventricle demonstrates global hypokinesis. Left ventricular diastolic parameters are consistent with Grade I diastolic dysfunction (impaired relaxation). Staff endorses no other changes. At present, Erica Mueller is lying in bed asleep. Erica Mueller did awake to verbal cues. Erica Mueller and I talked about symptoms ros negative. We talked about quality of life. Erica Mueller was cooperative with assessment. We talked about residing at facility. We talked about her needs. We talked about challenges residing at [facility]. We talked about debility, getting oob. We talked about sleep patterns, napping. Ms. Klawitter endorses she wanted to take a nap, ended pc visit in agreement to be continue to be followed by pc. Next visit in 4 weeks to monitor weights. Updated staff.   History obtained from review of EMR, discussion with facility staff and  Erica Mueller.  I reviewed available labs, medications, imaging, studies and related documents from the EMR.  Records reviewed and summarized above.   ROS Full 10 system review of systems performed and negative with exception of: as per HPI.   Physical Exam: Constitutional: NAD General: frail appearing, thin, debilitated, chronically ill, confused female EYES: lids intact ENMT: oral mucous membranes moist CV: S1S2, RRR, no LE edema Pulmonary: LCTA, no increased work of breathing, no cough, room air Abdomen: normo-active BS + 4 quadrants, soft and non tender, MSK: w/c; muscle wasting Skin: warm and dry Neuro:  + generalized weakness,  + cognitive impairment Psych: flat affect, Alert, confused Questions and concerns were addressed. Provided general support and  encouragement, no other unmet needs identified   Thank you for the opportunity to participate in the care of Erica Mueller.  The palliative care team will continue to follow. Please call our office at [phone] if we can be of additional assistance.   This chart was dictated using voice recognition software.  Despite best efforts to proofread,  errors can occur which can change the documentation meaning.   Yaniris Braddock Ihor Gully, NP

## 2021-05-18 DIAGNOSIS — R197 Diarrhea, unspecified: Secondary | ICD-10-CM | POA: Diagnosis not present

## 2021-05-19 ENCOUNTER — Encounter: Payer: Self-pay | Admitting: Nurse Practitioner

## 2021-05-19 ENCOUNTER — Non-Acute Institutional Stay: Payer: Medicare Other | Admitting: Nurse Practitioner

## 2021-05-19 VITALS — BP 140/72 | HR 68 | Temp 97.2°F | Resp 18 | Wt 97.5 lb

## 2021-05-19 DIAGNOSIS — Z515 Encounter for palliative care: Secondary | ICD-10-CM | POA: Diagnosis not present

## 2021-05-19 DIAGNOSIS — R5381 Other malaise: Secondary | ICD-10-CM | POA: Diagnosis not present

## 2021-05-19 NOTE — Progress Notes (Addendum)
[place] Consult Note Telephone: [phone]  Fax: [phone]    Date of encounter: 05/19/21 8:37 PM PATIENT NAME: Erica Mueller [address] [place] [address]   [phone] (home)  DOB: 1951/05/11 MRN: [id number] PRIMARY CARE PROVIDER:    [facility]  RESPONSIBLE PARTY:    Contact Information     Name Relation Home Work Mobile   Erica Mueller Son   [phone]   [facility] Brother   [phone]   Erica Mueller [phone]     Erica Mueller, Erica Mueller Granddaughter   [phone]      I met face to face with patient in facility. Palliative Care was asked to follow this patient by consultation request of  [place] to address advance care planning and complex medical decision making. This is a follow up visit.                                  ASSESSMENT AND PLAN / RECOMMENDATIONS:  Symptom Management/Plan: 1. Advance Care Planning; Full code pending further discussions with family for clarification of medical goals   2. Anorexia secondary to schizophrenia, depression; continue to encourage to eat, weights; Diabetic diet, L7-Easy Chew w/minced meats texture, Level 2 - Mildly Thick consistency with med plus supplements.   01/10/2021 weight 109.6 lbs 03/29/2021 weight 104 lbs 04/24/2021 weight 102 lbs 05/15/2021 weight 97.5 lbs BMI 17.6  04/05/2021 WBC 4.6, hemoglobin 12.5, hematocrit 37.5, platelets 207, sodium 138, potassium hemolyzed, chloride 101, Co 225, calcium 9.9, BUN 29, creatinine 0.69   3. Palliative care encounter; Palliative care encounter; Palliative medicine team will continue to support patient, patient's family, and medical team. Visit consisted of counseling and education dealing with the complex and emotionally intense issues of symptom management and palliative care in the setting of serious and potentially life-threatening illness   4. f/u 4 weeks for ongoing monitoring chronic disease  progression, ongoing discussions complex medical decision making Follow up Palliative Care Visit: Palliative care will continue to follow for complex medical decision making, advance care planning, and clarification of goals. Return 4 weeks or prn.  I spent 66 minutes providing this consultation. More than 50% of the time in this consultation was spent in counseling and care coordination PPS: 30%  Chief Complaint: Follow up palliative consult for complex medical decision making  HISTORY OF PRESENT ILLNESS:  Erica Mueller is a 70 y.o. year old female  with multiple medical problems including CAD, DM, HTN, GERD, syphilis, elevated lips, schizophrenia, depression. Erica Mueller was hospitalized 11/07/2020 to 11/11/2020 for severe sepsis with altered mental status secondary to acute cystitis. Erica Mueller resides at SNF at [facility]. Erica Mueller does require assistance with bathing, dressing prompting; toileting. Erica Mueller is ambulatory. [name] does feed herself with varied appetite. [name] is a full code. At present, Erica Mueller is lying in bed, making eye contact, interactive today. Erica Mueller has continued to receive ECT. We talked about how she has been feeling, limited with cognitive impairment. Reviewed medical goals. We talked about role pc in poc. We talked about quality of life. Emotional support provided. I updated staff requesting to have activities interact with Erica Mueller, try to bring her to cafe for meals, social interaction.   History obtained from review of EMR, discussion with  Facility staff and Ms. Erica Mueller.  I reviewed available labs, medications, imaging, studies and related documents from  the EMR.  Records reviewed and summarized above.   ROS Full 10 system review of systems performed and negative with exception of: as per HPI.   Physical Exam: Constitutional: NAD General: frail appearing, thin, chronically ill, female EYES: lids intact ENMT: oral mucous membranes moist CV: S1S2,  RRR Pulmonary: LCTA, no increased work of breathing, no cough, room air Abdomen: normo-active BS + 4 quadrants, soft and non tender MSK: bed-bound Skin: warm and dry Neuro:  + generalized weakness,  + cognitive impairment Psych: non-anxious affect, A and Oriented to self, place  Thank you for the opportunity to participate in the care of Ms. Erica Mueller.  The palliative care team will continue to follow. Please call our office at [phone] if we can be of additional assistance.   This chart was dictated using voice recognition software.  Despite best efforts to proofread,  errors can occur which can change the documentation meaning.  Questions and concerns were addressed. Provided general support and encouragement, no other unmet needs identified   Erica Pleitez Ihor Gully, NP

## 2021-05-26 DIAGNOSIS — R1310 Dysphagia, unspecified: Secondary | ICD-10-CM | POA: Diagnosis not present

## 2021-05-26 DIAGNOSIS — I5040 Unspecified combined systolic (congestive) and diastolic (congestive) heart failure: Secondary | ICD-10-CM | POA: Diagnosis not present

## 2021-05-26 DIAGNOSIS — I10 Essential (primary) hypertension: Secondary | ICD-10-CM | POA: Diagnosis not present

## 2021-05-26 DIAGNOSIS — R627 Adult failure to thrive: Secondary | ICD-10-CM | POA: Diagnosis not present

## 2021-05-29 DIAGNOSIS — R059 Cough, unspecified: Secondary | ICD-10-CM | POA: Diagnosis not present

## 2021-06-13 DIAGNOSIS — R1310 Dysphagia, unspecified: Secondary | ICD-10-CM | POA: Diagnosis not present

## 2021-06-13 DIAGNOSIS — R059 Cough, unspecified: Secondary | ICD-10-CM | POA: Diagnosis not present

## 2021-06-30 ENCOUNTER — Encounter: Payer: Self-pay | Admitting: Nurse Practitioner

## 2021-06-30 ENCOUNTER — Non-Acute Institutional Stay: Payer: Medicare Other | Admitting: Nurse Practitioner

## 2021-06-30 VITALS — BP 140/72 | HR 74 | Temp 97.0°F | Resp 18 | Wt 105.9 lb

## 2021-06-30 DIAGNOSIS — R0602 Shortness of breath: Secondary | ICD-10-CM

## 2021-06-30 DIAGNOSIS — R630 Anorexia: Secondary | ICD-10-CM

## 2021-06-30 DIAGNOSIS — Z515 Encounter for palliative care: Secondary | ICD-10-CM | POA: Diagnosis not present

## 2021-06-30 DIAGNOSIS — I509 Heart failure, unspecified: Secondary | ICD-10-CM | POA: Diagnosis not present

## 2021-06-30 NOTE — Progress Notes (Signed)
[place] Consult Note Telephone: [phone]  Fax: [phone]    Date of encounter: 06/30/21 8:34 PM PATIENT NAME: Erica Mueller [address] [place] [address]   [phone] (home)  DOB: Aug 09, 1950 MRN: [id number] PRIMARY CARE PROVIDER:    [facility]  RESPONSIBLE PARTY:    Contact Information     Name Relation Home Work Mobile   [place] Son   [phone]   [facility] Brother   [phone]   Drinda Butts [phone]     Abrish, Erny Granddaughter   [phone]      I met face to face with patient in facility. Palliative Care was asked to follow this patient by consultation request of  [employer] to address advance care planning and complex medical decision making. This is a follow up visit.                                ASSESSMENT AND PLAN / RECOMMENDATIONS:  Symptom Management/Plan: 1. Advance Care Planning; Full code pending further discussions with family for clarification of medical goals   2. Shortness of breath stable secondary to CHF, currently stable. Continue to monitor respiratory symptoms, continue weights, monitoring edema. Continue outpatient cardiology appointments. Currently not on diuretic   3. Anorexia; weight has 1 lb gain, been relatively stable. Patient is eating varying PO intake. Receiving bolus TF BID, recently decreased from five times daily to promote increase PO intake. Continue to monitor weights, encourage to eat, emotional support    4. Palliative care encounter; Palliative care encounter; Palliative medicine team will continue to support patient, patient's family, and medical team. Visit consisted of counseling and education dealing with the complex and emotionally intense issues of symptom management and palliative care in the setting of serious and potentially life-threatening illness   5. f/u 8 weeks for ongoing monitoring chronic disease progression,  ongoing discussions complex medical decision making  Follow up Palliative Care Visit: Palliative care will continue to follow for complex medical decision making, advance care planning, and clarification of goals. Return 8  weeks or prn.  I spent 68 minutes providing this consultation starting at 10:30 am. More than 50% of the time in this consultation was spent in counseling and care coordination. PPS: 40%  Chief Complaint: Follow up palliative consult for complex medical decision making  HISTORY OF PRESENT ILLNESS:  ABBYGALE Mueller is a 71 y.o. year old female  with  multiple medical problems including CAD, DM, HTN, GERD, syphilis, schizophrenia, depression. Erica Mueller resides at SNF at [facility]. Erica Mueller does require assistance with bathing, dressing prompting; toileting. Staff endorses Erica Mueller is her own RP. Erica Mueller does ambulate when she "feels like it". Staff find her ambulating in the hall. Other times she won't get oob. Erica Mueller is fed through tube feedings essential to sustain life, recently started on a diet.  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 Psychiatry is recommending outpatient follow-up for further management. 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 Followed at Cardiology for Heart failure with reduced EF with plan to continue current therapy, no s/s CHF and not suitable to undergo stress testing or other ischemic evaluation due to inability to participate in the test. Echo- 01/2021- 1. Left ventricular ejection fraction, by estimation, is 30 to 35%. The left ventricle has moderate  to severely decreased function. The left ventricle demonstrates global hypokinesis. Left ventricular diastolic parameters are consistent with Grade I diastolic dysfunction (impaired relaxation). Staff endorses no other changes. At present, Erica Mueller is lying in bed asleep. Erica Mueller did awake to verbal  cues. Erica Mueller and I talked about symptoms, ros negative. We talked about quality of life. Erica Mueller was cooperative with assessment. We talked about residing at facility. We talked about her needs. We talked about challenges residing at [facility]. We talked about debility, getting oob. We talked about sleep patterns, napping. Erica Mueller endorses like residing at [facility]. We talked about her daily routine, coping strategies though limited. Erica Mueller was cooperative with assessment. Medical goals reviewed. Weight stable. Will continue to follow. I Updated staff.  Therapeutic listening, emotional support provided. Questions answered.   History obtained from review of EMR, discussion with facility staff and Erica Mueller.  I reviewed available labs, medications, imaging, studies and related documents from the EMR.  Records reviewed and summarized above.   ROS 10 point system reviewed with facility staff and [name] all negative exept HPI  Physical Exam: Constitutional: NAD General: frail appearing, pleasant female EYES: lids intact ENMT: oral mucous membranes moist CV: S1S2, RRR Pulmonary: LCTA, no increased work of breathing, no cough, room air Abdomen: normo-active BS + 4 quadrants, soft and non tender MSK: ambulatory Skin: warm and dry Neuro:  + generalized weakness,  + cognitive impairment Psych: non-anxious affect, A and O x 2 Thank you for the opportunity to participate in the care of Erica Mueller.  The palliative care team will continue to follow. Please call our office at [phone] if we can be of additional assistance.   Questions and concerns were addressed.  Provided general support and encouragement, no other unmet needs identified   This chart was dictated using voice recognition software.  Despite best efforts to proofread,  errors can occur which can change the documentation meaning.   Jeremiyah Cullens Ihor Gully, NP

## 2021-07-03 ENCOUNTER — Other Ambulatory Visit: Payer: Self-pay

## 2021-07-04 DIAGNOSIS — I5020 Unspecified systolic (congestive) heart failure: Secondary | ICD-10-CM | POA: Diagnosis not present

## 2021-07-17 DIAGNOSIS — R059 Cough, unspecified: Secondary | ICD-10-CM | POA: Diagnosis not present

## 2021-07-18 DIAGNOSIS — I351 Nonrheumatic aortic (valve) insufficiency: Secondary | ICD-10-CM | POA: Diagnosis not present

## 2021-07-18 DIAGNOSIS — I5020 Unspecified systolic (congestive) heart failure: Secondary | ICD-10-CM | POA: Diagnosis not present

## 2021-07-18 DIAGNOSIS — I700 Atherosclerosis of aorta: Secondary | ICD-10-CM | POA: Diagnosis not present

## 2021-07-31 ENCOUNTER — Encounter: Payer: Self-pay | Admitting: Nurse Practitioner

## 2021-07-31 ENCOUNTER — Non-Acute Institutional Stay: Payer: Medicare Other | Admitting: Nurse Practitioner

## 2021-07-31 ENCOUNTER — Other Ambulatory Visit: Payer: Self-pay

## 2021-07-31 VITALS — BP 110/62 | HR 86 | Temp 97.7°F | Resp 18 | Wt 97.3 lb

## 2021-07-31 DIAGNOSIS — R634 Abnormal weight loss: Secondary | ICD-10-CM

## 2021-07-31 DIAGNOSIS — Z515 Encounter for palliative care: Secondary | ICD-10-CM

## 2021-07-31 DIAGNOSIS — R630 Anorexia: Secondary | ICD-10-CM

## 2021-07-31 DIAGNOSIS — R0602 Shortness of breath: Secondary | ICD-10-CM | POA: Diagnosis not present

## 2021-07-31 DIAGNOSIS — I509 Heart failure, unspecified: Secondary | ICD-10-CM | POA: Diagnosis not present

## 2021-07-31 NOTE — Progress Notes (Addendum)
[facility] Consult Note Telephone: [phone]  Fax: [phone]    Date of encounter: 07/31/21 2:22 PM PATIENT NAME: Erica Mueller [address] [place] [address]   [phone] (home)  DOB: 04-01-1951 MRN: [id number] PRIMARY CARE PROVIDER:    [place],  [place] [address] [phone]  REFERRING PROVIDER:   [place],  [place] [address] [phone]  RESPONSIBLE PARTY:    Contact Information     Name Relation Home Work Mobile   [facility] Son   [phone]   [facility] Brother   [phone]   Drinda Butts [phone]     Yaeli, Hartung Granddaughter   [phone]      I met face to face with patient in facility. Palliative Care was asked to follow this patient by consultation request of  [place] to address advance care planning and complex medical decision making. This is a follow up visit.                                  ASSESSMENT AND PLAN / RECOMMENDATIONS:  Symptom Management/Plan: 1. Advance Care Planning; Full code pending further discussions with family for clarification of medical goals   2. Shortness of breath stable secondary to CHF, currently stable. Continue to monitor respiratory symptoms, continue weights, monitoring edema. Continue outpatient cardiology appointments. Currently not on diuretic   3. Anorexia; weight loss of 6.2 lbs/6 weeks with 5.99% current weight 97.3 lbs. Receiving bolus TF BID, recently decreased from five times daily to promote increase PO intake. Continue to monitor weights, encourage to eat, emotional support   06/07/2021 wbc 3.6, hgb 12, hct 36.5, platelets 213, sodium 136, potassium 5.0, chloride 99, Co2 24, calcium 10.0, bun 10.5, creatinine 0.70, glucose 169, albumin 4.4, total protein 7.1   4. Palliative care encounter; Palliative care encounter; Palliative medicine team will continue to  support patient, patient's family, and medical team. Visit consisted of counseling and education dealing with the complex and emotionally intense issues of symptom management and palliative care in the setting of serious and potentially life-threatening illness   5. f/u 8 weeks for ongoing monitoring chronic disease progression, ongoing discussions complex medical decision making  Follow up Palliative Care Visit: Palliative care will continue to follow for complex medical decision making, advance care planning, and clarification of goals. Return 8 weeks or prn.  I spent 46 minutes providing this consultation started at 12:15pm. More than 50% of the time in this consultation was spent in counseling and care coordination. PPS: 50%  Chief Complaint: Follow up palliative consult for complex medical decision making  HISTORY OF PRESENT ILLNESS:  Erica Mueller is a 71 y.o. year old female  with multiple medical problems including CAD, DM, HTN, GERD, syphilis, schizophrenia, depression. [name] resides at SNF at [facility]. [name] does require assistance with bathing, dressing prompting; toileting. [name] does ambulate in the halls, no recent falls, wounds, hospitalizations, infections.  [name] is fed through tube feedings essential to sustain life, dietician to consult; weight loss of 6.2 lbs/6 weeks with 5.99% current weight 97.3 lbs. Followed by psychiatry; 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 Followed at Cardiology for Heart failure with reduced EF with plan to continue current therapy, no s/s CHF and not suitable to undergo stress testing or other ischemic evaluation  due to inability to participate in the test. Staff endorses no other changes. At present, [name] is lying in bed awake, makes eye contact, interactive. [name] and I talked about symptoms, ros negative. We talked about quality of life. [name] was cooperative with assessment. We talked about  residing at facility. We talked about ambulating, fall precaution. We talked about appetite, foods she likes. We talked about sleep patterns, napping. [name] endorses like residing at [facility]. We talked about her daily routine, coping strategies though limited. [name] was cooperative with assessment. Medical goals reviewed. Will continue to follow. I updated staff. I attempted to contact [name] sister for update. Therapeutic listening, emotional support provided. Questions answered.    Echo- 01/2021- 1. Left ventricular ejection fraction, by estimation, is 30 to 35%. The left ventricle has moderate to severely decreased function. The left ventricle demonstrates global hypokinesis. Left ventricular diastolic parameters are consistent with Grade I diastolic dysfunction (impaired relaxation).   History obtained from review of EMR, discussion with facility staff and  [name]  I reviewed available labs, medications, imaging, studies and related documents from the EMR.  Records reviewed and summarized above.   ROS 10 point system reviewed with staff, [name] all negative except HPI  Physical Exam: Constitutional: NAD General: frail appearing, pleasant female EYES: lids intact ENMT: oral mucous membranes moist CV: S1S2, RRR Pulmonary: LCTA, no increased work of breathing, no cough, room air Abdomen:  normo-active BS + 4 quadrants, soft and non tender Skin: warm and dry Neuro:  + generalized weakness,  + cognitive impairment Psych: non-anxious affect, A and Oriented to self Thank you for the opportunity to participate in the care of [name]  The palliative care team will continue to follow. Please call our office at [phone] if we can be of additional assistance.   Jaci Desanto Ihor Gully, NP

## 2021-08-03 DIAGNOSIS — Z741 Need for assistance with personal care: Secondary | ICD-10-CM | POA: Diagnosis not present

## 2021-08-03 DIAGNOSIS — I5042 Chronic combined systolic (congestive) and diastolic (congestive) heart failure: Secondary | ICD-10-CM | POA: Diagnosis not present

## 2021-08-03 DIAGNOSIS — R1312 Dysphagia, oropharyngeal phase: Secondary | ICD-10-CM | POA: Diagnosis not present

## 2021-08-04 DIAGNOSIS — R1312 Dysphagia, oropharyngeal phase: Secondary | ICD-10-CM | POA: Diagnosis not present

## 2021-08-04 DIAGNOSIS — Z741 Need for assistance with personal care: Secondary | ICD-10-CM | POA: Diagnosis not present

## 2021-08-04 DIAGNOSIS — I5042 Chronic combined systolic (congestive) and diastolic (congestive) heart failure: Secondary | ICD-10-CM | POA: Diagnosis not present

## 2021-08-07 DIAGNOSIS — Z741 Need for assistance with personal care: Secondary | ICD-10-CM | POA: Diagnosis not present

## 2021-08-07 DIAGNOSIS — R1312 Dysphagia, oropharyngeal phase: Secondary | ICD-10-CM | POA: Diagnosis not present

## 2021-08-07 DIAGNOSIS — I5042 Chronic combined systolic (congestive) and diastolic (congestive) heart failure: Secondary | ICD-10-CM | POA: Diagnosis not present

## 2021-08-08 DIAGNOSIS — R1312 Dysphagia, oropharyngeal phase: Secondary | ICD-10-CM | POA: Diagnosis not present

## 2021-08-08 DIAGNOSIS — Z741 Need for assistance with personal care: Secondary | ICD-10-CM | POA: Diagnosis not present

## 2021-08-08 DIAGNOSIS — I5042 Chronic combined systolic (congestive) and diastolic (congestive) heart failure: Secondary | ICD-10-CM | POA: Diagnosis not present

## 2021-08-09 DIAGNOSIS — M6281 Muscle weakness (generalized): Secondary | ICD-10-CM | POA: Diagnosis not present

## 2021-08-09 DIAGNOSIS — I5042 Chronic combined systolic (congestive) and diastolic (congestive) heart failure: Secondary | ICD-10-CM | POA: Diagnosis not present

## 2021-08-09 DIAGNOSIS — R1312 Dysphagia, oropharyngeal phase: Secondary | ICD-10-CM | POA: Diagnosis not present

## 2021-08-11 DIAGNOSIS — R5383 Other fatigue: Secondary | ICD-10-CM | POA: Diagnosis not present

## 2021-08-24 DIAGNOSIS — R053 Chronic cough: Secondary | ICD-10-CM | POA: Diagnosis not present

## 2021-08-28 DIAGNOSIS — E1165 Type 2 diabetes mellitus with hyperglycemia: Secondary | ICD-10-CM | POA: Diagnosis not present

## 2021-08-30 DIAGNOSIS — I10 Essential (primary) hypertension: Secondary | ICD-10-CM | POA: Diagnosis not present

## 2021-08-30 DIAGNOSIS — R059 Cough, unspecified: Secondary | ICD-10-CM | POA: Diagnosis not present

## 2021-09-07 DIAGNOSIS — R109 Unspecified abdominal pain: Secondary | ICD-10-CM | POA: Diagnosis not present

## 2021-09-07 DIAGNOSIS — R5383 Other fatigue: Secondary | ICD-10-CM | POA: Diagnosis not present

## 2021-09-09 DIAGNOSIS — M6281 Muscle weakness (generalized): Secondary | ICD-10-CM | POA: Diagnosis not present

## 2021-09-09 DIAGNOSIS — I5042 Chronic combined systolic (congestive) and diastolic (congestive) heart failure: Secondary | ICD-10-CM | POA: Diagnosis not present

## 2021-09-10 DIAGNOSIS — I5042 Chronic combined systolic (congestive) and diastolic (congestive) heart failure: Secondary | ICD-10-CM | POA: Diagnosis not present

## 2021-09-10 DIAGNOSIS — M6281 Muscle weakness (generalized): Secondary | ICD-10-CM | POA: Diagnosis not present

## 2021-09-12 DIAGNOSIS — I5042 Chronic combined systolic (congestive) and diastolic (congestive) heart failure: Secondary | ICD-10-CM | POA: Diagnosis not present

## 2021-09-12 DIAGNOSIS — E119 Type 2 diabetes mellitus without complications: Secondary | ICD-10-CM | POA: Diagnosis not present

## 2021-09-12 DIAGNOSIS — N390 Urinary tract infection, site not specified: Secondary | ICD-10-CM | POA: Diagnosis not present

## 2021-09-12 DIAGNOSIS — M6281 Muscle weakness (generalized): Secondary | ICD-10-CM | POA: Diagnosis not present

## 2021-09-13 DIAGNOSIS — I5042 Chronic combined systolic (congestive) and diastolic (congestive) heart failure: Secondary | ICD-10-CM | POA: Diagnosis not present

## 2021-09-13 DIAGNOSIS — M6281 Muscle weakness (generalized): Secondary | ICD-10-CM | POA: Diagnosis not present

## 2021-09-14 DIAGNOSIS — M6281 Muscle weakness (generalized): Secondary | ICD-10-CM | POA: Diagnosis not present

## 2021-09-14 DIAGNOSIS — I5042 Chronic combined systolic (congestive) and diastolic (congestive) heart failure: Secondary | ICD-10-CM | POA: Diagnosis not present

## 2021-09-16 DIAGNOSIS — I5042 Chronic combined systolic (congestive) and diastolic (congestive) heart failure: Secondary | ICD-10-CM | POA: Diagnosis not present

## 2021-09-16 DIAGNOSIS — M6281 Muscle weakness (generalized): Secondary | ICD-10-CM | POA: Diagnosis not present

## 2021-09-17 DIAGNOSIS — I5042 Chronic combined systolic (congestive) and diastolic (congestive) heart failure: Secondary | ICD-10-CM | POA: Diagnosis not present

## 2021-09-17 DIAGNOSIS — M6281 Muscle weakness (generalized): Secondary | ICD-10-CM | POA: Diagnosis not present

## 2021-09-18 DIAGNOSIS — I5042 Chronic combined systolic (congestive) and diastolic (congestive) heart failure: Secondary | ICD-10-CM | POA: Diagnosis not present

## 2021-09-18 DIAGNOSIS — M6281 Muscle weakness (generalized): Secondary | ICD-10-CM | POA: Diagnosis not present

## 2021-09-19 DIAGNOSIS — M6281 Muscle weakness (generalized): Secondary | ICD-10-CM | POA: Diagnosis not present

## 2021-09-19 DIAGNOSIS — I5042 Chronic combined systolic (congestive) and diastolic (congestive) heart failure: Secondary | ICD-10-CM | POA: Diagnosis not present

## 2021-09-21 DIAGNOSIS — I5042 Chronic combined systolic (congestive) and diastolic (congestive) heart failure: Secondary | ICD-10-CM | POA: Diagnosis not present

## 2021-09-21 DIAGNOSIS — M6281 Muscle weakness (generalized): Secondary | ICD-10-CM | POA: Diagnosis not present

## 2021-09-22 DIAGNOSIS — I5042 Chronic combined systolic (congestive) and diastolic (congestive) heart failure: Secondary | ICD-10-CM | POA: Diagnosis not present

## 2021-09-22 DIAGNOSIS — M6281 Muscle weakness (generalized): Secondary | ICD-10-CM | POA: Diagnosis not present

## 2021-09-25 DIAGNOSIS — M6281 Muscle weakness (generalized): Secondary | ICD-10-CM | POA: Diagnosis not present

## 2021-09-25 DIAGNOSIS — I5042 Chronic combined systolic (congestive) and diastolic (congestive) heart failure: Secondary | ICD-10-CM | POA: Diagnosis not present

## 2021-09-26 DIAGNOSIS — M6281 Muscle weakness (generalized): Secondary | ICD-10-CM | POA: Diagnosis not present

## 2021-09-26 DIAGNOSIS — I10 Essential (primary) hypertension: Secondary | ICD-10-CM | POA: Diagnosis not present

## 2021-09-26 DIAGNOSIS — I5042 Chronic combined systolic (congestive) and diastolic (congestive) heart failure: Secondary | ICD-10-CM | POA: Diagnosis not present

## 2021-09-26 DIAGNOSIS — R2231 Localized swelling, mass and lump, right upper limb: Secondary | ICD-10-CM | POA: Diagnosis not present

## 2021-09-27 DIAGNOSIS — I5042 Chronic combined systolic (congestive) and diastolic (congestive) heart failure: Secondary | ICD-10-CM | POA: Diagnosis not present

## 2021-09-27 DIAGNOSIS — R229 Localized swelling, mass and lump, unspecified: Secondary | ICD-10-CM | POA: Diagnosis not present

## 2021-09-27 DIAGNOSIS — M6281 Muscle weakness (generalized): Secondary | ICD-10-CM | POA: Diagnosis not present

## 2021-09-28 DIAGNOSIS — M6281 Muscle weakness (generalized): Secondary | ICD-10-CM | POA: Diagnosis not present

## 2021-09-28 DIAGNOSIS — I5042 Chronic combined systolic (congestive) and diastolic (congestive) heart failure: Secondary | ICD-10-CM | POA: Diagnosis not present

## 2021-09-29 DIAGNOSIS — I10 Essential (primary) hypertension: Secondary | ICD-10-CM | POA: Diagnosis not present

## 2021-09-29 DIAGNOSIS — I5042 Chronic combined systolic (congestive) and diastolic (congestive) heart failure: Secondary | ICD-10-CM | POA: Diagnosis not present

## 2021-09-29 DIAGNOSIS — E1165 Type 2 diabetes mellitus with hyperglycemia: Secondary | ICD-10-CM | POA: Diagnosis not present

## 2021-09-29 DIAGNOSIS — D638 Anemia in other chronic diseases classified elsewhere: Secondary | ICD-10-CM | POA: Diagnosis not present

## 2021-09-29 DIAGNOSIS — K219 Gastro-esophageal reflux disease without esophagitis: Secondary | ICD-10-CM | POA: Diagnosis not present

## 2021-09-29 DIAGNOSIS — E785 Hyperlipidemia, unspecified: Secondary | ICD-10-CM | POA: Diagnosis not present

## 2021-09-29 DIAGNOSIS — R634 Abnormal weight loss: Secondary | ICD-10-CM | POA: Diagnosis not present

## 2021-09-29 DIAGNOSIS — M6281 Muscle weakness (generalized): Secondary | ICD-10-CM | POA: Diagnosis not present

## 2021-10-02 DIAGNOSIS — I5042 Chronic combined systolic (congestive) and diastolic (congestive) heart failure: Secondary | ICD-10-CM | POA: Diagnosis not present

## 2021-10-02 DIAGNOSIS — M6281 Muscle weakness (generalized): Secondary | ICD-10-CM | POA: Diagnosis not present

## 2021-10-03 DIAGNOSIS — M6281 Muscle weakness (generalized): Secondary | ICD-10-CM | POA: Diagnosis not present

## 2021-10-03 DIAGNOSIS — I5042 Chronic combined systolic (congestive) and diastolic (congestive) heart failure: Secondary | ICD-10-CM | POA: Diagnosis not present

## 2021-10-03 DIAGNOSIS — R634 Abnormal weight loss: Secondary | ICD-10-CM | POA: Diagnosis not present

## 2021-10-04 DIAGNOSIS — M6281 Muscle weakness (generalized): Secondary | ICD-10-CM | POA: Diagnosis not present

## 2021-10-04 DIAGNOSIS — I5042 Chronic combined systolic (congestive) and diastolic (congestive) heart failure: Secondary | ICD-10-CM | POA: Diagnosis not present

## 2021-10-05 ENCOUNTER — Other Ambulatory Visit: Payer: Self-pay | Admitting: Physician Assistant

## 2021-10-05 DIAGNOSIS — M6281 Muscle weakness (generalized): Secondary | ICD-10-CM | POA: Diagnosis not present

## 2021-10-05 DIAGNOSIS — I5042 Chronic combined systolic (congestive) and diastolic (congestive) heart failure: Secondary | ICD-10-CM | POA: Diagnosis not present

## 2021-10-05 DIAGNOSIS — R590 Localized enlarged lymph nodes: Secondary | ICD-10-CM

## 2021-10-06 DIAGNOSIS — I5042 Chronic combined systolic (congestive) and diastolic (congestive) heart failure: Secondary | ICD-10-CM | POA: Diagnosis not present

## 2021-10-06 DIAGNOSIS — M6281 Muscle weakness (generalized): Secondary | ICD-10-CM | POA: Diagnosis not present

## 2021-10-16 ENCOUNTER — Encounter: Payer: Self-pay | Admitting: Nurse Practitioner

## 2021-10-16 ENCOUNTER — Non-Acute Institutional Stay: Payer: Medicare Other | Admitting: Nurse Practitioner

## 2021-10-16 VITALS — BP 148/70 | HR 82 | Temp 97.7°F | Resp 18 | Wt 110.5 lb

## 2021-10-16 DIAGNOSIS — I509 Heart failure, unspecified: Secondary | ICD-10-CM | POA: Diagnosis not present

## 2021-10-16 DIAGNOSIS — R630 Anorexia: Secondary | ICD-10-CM

## 2021-10-16 DIAGNOSIS — I739 Peripheral vascular disease, unspecified: Secondary | ICD-10-CM | POA: Diagnosis not present

## 2021-10-16 DIAGNOSIS — Z515 Encounter for palliative care: Secondary | ICD-10-CM | POA: Diagnosis not present

## 2021-10-16 DIAGNOSIS — R0602 Shortness of breath: Secondary | ICD-10-CM

## 2021-10-16 DIAGNOSIS — L603 Nail dystrophy: Secondary | ICD-10-CM | POA: Diagnosis not present

## 2021-10-16 NOTE — Progress Notes (Signed)
? ? ?[facility] ?[facility] Consult Note ?Telephone: [phone]  ?Fax: [phone]  ? ? ?Date of encounter: 10/16/21 ?5:18 PM ?PATIENT NAME: Erica Mueller ?[address] ?Erica [place] [address]   [phone] (home)  ?DOB: [birth year] ?MRN: [id number] ?PRIMARY CARE PROVIDER:    ?[employer] ? ?RESPONSIBLE PARTY:    ?Contact Information   ? ? Name Relation Home Work Mobile  ? [facility] Son   [phone]  ? Sheral Flow Brother   [phone]  ? Ina Homes Sister [phone]    ? Seham, [place] Granddaughter   [phone]  ? ?  ? ?I met face to face with patient in facility. Palliative Care was asked to follow this patient by consultation request of  [place] to address advance care planning and complex medical decision making. This is a follow up visit.                                  ?ASSESSMENT AND PLAN / RECOMMENDATIONS:  ?Symptom Management/Plan: ?1. Advance Care Planning; Full code pending further discussions with family for clarification of medical goals ?  ?2. Shortness of breath stable secondary to CHF, currently stable. Continue to monitor respiratory symptoms, continue weights, monitoring edema. Continue outpatient cardiology appointments. Currently not on diuretic ?  ?3. Anorexia; Continue to monitor weights, encourage to eat, emotional support  ?  ?4. Palliative care encounter; Palliative care encounter; Palliative medicine team will continue to support patient, patient's family, and medical team. Visit consisted of counseling and education dealing with the complex and emotionally intense issues of symptom management and palliative care in the setting of serious and potentially life-threatening illness ?  ?5. f/u 8 weeks for ongoing monitoring chronic disease progression, ongoing discussions complex medical decision making ? ?Follow up Palliative Care Visit: Palliative care will continue to follow for complex medical decision making, advance care  planning, and clarification of goals. Return 4 weeks or prn. ? ?I spent 38 minutes providing this consultation starting at 2:15 pm. More than 50% of the time in this consultation was spent in counseling and care coordination. ?PPS: 40% ?Chief Complaint: Follow up palliative consult for complex medical decision making ? ?HISTORY OF PRESENT ILLNESS:  Erica Mueller is a 71 y.o. year old female  with multiple medical problems including CAD, DM, HTN, GERD, syphilis, schizophrenia, depression. Erica Mueller resides at SNF at [facility]. Erica Mueller does require assistance with bathing, dressing prompting; toileting. No recent falls, wounds, hospitalizations per staff. At present Erica Mueller is sitting in the chair in front of the nurses station, appears comfortable.  Erica Mueller and I talked about symptoms, ros negative, though limited today, does not remember eating lunch, or breakfast, did not remember where her room was.  Erica Mueller was cooperative with assessment. Limited discussion with cognitive impairment, attempted to reach sister for update pc visit, no changes recommended. Medical goals reviewed. Will continue to follow. Therapeutic listening, emotional support provided. Questions answered.  ?  ?History obtained from review of EMR, discussion with facility staff and Erica Mueller.  ?I reviewed available labs, medications, imaging, studies and related documents from the EMR.  Records reviewed and summarized above.  ?  ?ROS ?10 point system reviewed with facility staff and Erica Mueller all negative exept HPI ?  ?Physical Exam: ?Constitutional: NAD ?General: frail appearing, pleasant female ?EYES: lids intact ?ENMT: oral mucous membranes moist ?CV: S1S2, RRR ?Pulmonary: LCTA, no increased  work of breathing, no cough, room air ?Abdomen: normo-active BS + 4 quadrants, soft and non tender ?MSK: ambulatory ?Skin: warm and dry ?Neuro:  + generalized weakness,  + cognitive impairment ?Psych: non-anxious affect, A and O x 2 ?Thank you for the  opportunity to participate in the care of Erica Mueller.  The palliative care team will continue to follow. Please call our office at [phone] if we can be of additional assistance.  ? ?Gerilyn Stargell Z Raniya Golembeski, NP  ?

## 2021-10-19 ENCOUNTER — Inpatient Hospital Stay: Admission: RE | Admit: 2021-10-19 | Payer: Medicare Other | Source: Ambulatory Visit

## 2021-10-19 ENCOUNTER — Other Ambulatory Visit: Payer: Medicare Other

## 2021-10-26 DIAGNOSIS — E785 Hyperlipidemia, unspecified: Secondary | ICD-10-CM | POA: Diagnosis not present

## 2021-10-26 DIAGNOSIS — I10 Essential (primary) hypertension: Secondary | ICD-10-CM | POA: Diagnosis not present

## 2021-10-26 DIAGNOSIS — K219 Gastro-esophageal reflux disease without esophagitis: Secondary | ICD-10-CM | POA: Diagnosis not present

## 2021-10-26 DIAGNOSIS — E1165 Type 2 diabetes mellitus with hyperglycemia: Secondary | ICD-10-CM | POA: Diagnosis not present

## 2021-11-02 DIAGNOSIS — Z931 Gastrostomy status: Secondary | ICD-10-CM | POA: Diagnosis not present

## 2021-11-02 DIAGNOSIS — E43 Unspecified severe protein-calorie malnutrition: Secondary | ICD-10-CM | POA: Diagnosis not present

## 2021-11-03 ENCOUNTER — Other Ambulatory Visit: Payer: Medicare Other

## 2021-11-03 ENCOUNTER — Inpatient Hospital Stay: Admission: RE | Admit: 2021-11-03 | Payer: Medicare Other | Source: Ambulatory Visit

## 2021-11-17 ENCOUNTER — Encounter: Payer: Self-pay | Admitting: Nurse Practitioner

## 2021-11-17 ENCOUNTER — Non-Acute Institutional Stay: Payer: Medicare Other | Admitting: Nurse Practitioner

## 2021-11-17 VITALS — BP 141/64 | HR 90 | Temp 97.7°F | Resp 18 | Wt 112.1 lb

## 2021-11-17 DIAGNOSIS — R0602 Shortness of breath: Secondary | ICD-10-CM

## 2021-11-17 DIAGNOSIS — I509 Heart failure, unspecified: Secondary | ICD-10-CM

## 2021-11-17 DIAGNOSIS — Z515 Encounter for palliative care: Secondary | ICD-10-CM

## 2021-11-17 DIAGNOSIS — R630 Anorexia: Secondary | ICD-10-CM

## 2021-11-17 NOTE — Progress Notes (Signed)
[facility] Consult Note Telephone: [phone]  Fax: [phone]    Date of encounter: 11/17/21 12:42 PM PATIENT NAME: Erica Mueller [address] [place] [address]   [phone] (home)  DOB: 1951/06/03 MRN: [id number] PRIMARY CARE PROVIDER:    [facility]  RESPONSIBLE PARTY:    Contact Information     Name Relation Home Work Mobile   [place] Son   [phone]   [facility] Brother   [phone]   Drinda Butts [phone]     Julane, Crock Granddaughter   [phone]      I met face to face with patient in facility. Palliative Care was asked to follow this patient by consultation request of  [place] to address advance care planning and complex medical decision making. This is a follow up visit.                                   ASSESSMENT AND PLAN / RECOMMENDATIONS:  Symptom Management/Plan: 1. Advance Care Planning; Full code pending further discussions with family for clarification of medical goals   2. Shortness of breath stable secondary to CHF, currently stable. Continue to monitor respiratory symptoms, continue weights, monitoring edema. Continue outpatient cardiology appointments. Currently not on diuretic   3. Anorexia; Weight gain, continue to monitor weights, encourage to eat, emotional support  08/15/2021 weight 108.2 lbs 11/08/2021 weight 110.8 lbs 11/15/2021 weight 112.1 lbs   4. Palliative care encounter; Palliative care encounter; Palliative medicine team will continue to support patient, patient's family, and medical team. Visit consisted of counseling and education dealing with the complex and emotionally intense issues of symptom management and palliative care in the setting of serious and potentially life-threatening illness   5. f/u 4 weeks for ongoing monitoring chronic disease progression, ongoing discussions complex medical decision making Follow up Palliative Care  Visit: Palliative care will continue to follow for complex medical decision making, advance care planning, and clarification of goals. Return 4 weeks or prn.   I spent 38 minutes providing this consultation starting at 11:00 am. More than 50% of the time in this consultation was spent in counseling and care coordination. PPS: 40% Chief Complaint: Follow up palliative consult for complex medical decision making   HISTORY OF PRESENT ILLNESS:  ELENA DAVIA is a 71 y.o. year old female  with multiple medical problems including CAD, DM, HTN, GERD, syphilis, schizophrenia, depression. Ms. Depolo resides at SNF at [facility]. Ms. Theriault does require assistance with bathing, dressing prompting; toileting. No recent falls, wounds, hospitalizations per staff. At present Ms. Leflore is ambulating around the nursing nurses station, appears comfortable.  Ms. Vieyra limited with interaction with cognitive impairment. Ms. Marcelino had steady gait. Per nursing no recent falls, wounds, hospitalizations, infections.  Ms. Pancoast was cooperative with assessment. Limited discussion with cognitive impairment, attempted to reach sister for update pc visit, no changes recommended. Medical goals reviewed. Will continue to follow. Therapeutic listening, emotional support provided. Questions answered.    History obtained from review of EMR, discussion with facility staff and [name]  I reviewed available labs, medications, imaging, studies and related documents from the EMR.  Records reviewed and summarized above.    ROS 10 point system reviewed with facility staff and Ms. Lakeman all negative exept HPI   Physical Exam: Constitutional: NAD General: frail appearing, pleasant female EYES: lids intact ENMT: oral mucous membranes moist  CV: S1S2, RRR Pulmonary: LCTA, no increased work of breathing, no cough, room air Abdomen: normo-active BS + 4 quadrants, soft and non tender MSK: ambulatory Skin: warm and dry Neuro:  + generalized weakness,   + cognitive impairment Psych: non-anxious affect, A and O x 2 Thank you for the opportunity to participate in the care of [name]  Thank you for the opportunity to participate in the care of [name]  The palliative care team will continue to follow. Please call our office at [phone] if we can be of additional assistance.   Asia Dusenbury Ihor Gully, NP

## 2021-11-28 DIAGNOSIS — I5042 Chronic combined systolic (congestive) and diastolic (congestive) heart failure: Secondary | ICD-10-CM | POA: Diagnosis not present

## 2021-11-28 DIAGNOSIS — E1165 Type 2 diabetes mellitus with hyperglycemia: Secondary | ICD-10-CM | POA: Diagnosis not present

## 2021-11-28 DIAGNOSIS — R627 Adult failure to thrive: Secondary | ICD-10-CM | POA: Diagnosis not present

## 2021-11-28 DIAGNOSIS — Z741 Need for assistance with personal care: Secondary | ICD-10-CM | POA: Diagnosis not present

## 2021-11-29 DIAGNOSIS — Z23 Encounter for immunization: Secondary | ICD-10-CM

## 2021-11-30 DIAGNOSIS — E785 Hyperlipidemia, unspecified: Secondary | ICD-10-CM | POA: Diagnosis not present

## 2021-11-30 DIAGNOSIS — E119 Type 2 diabetes mellitus without complications: Secondary | ICD-10-CM | POA: Diagnosis not present

## 2021-11-30 DIAGNOSIS — I10 Essential (primary) hypertension: Secondary | ICD-10-CM | POA: Diagnosis not present

## 2021-12-05 DIAGNOSIS — I10 Essential (primary) hypertension: Secondary | ICD-10-CM | POA: Diagnosis not present

## 2021-12-05 DIAGNOSIS — E1165 Type 2 diabetes mellitus with hyperglycemia: Secondary | ICD-10-CM | POA: Diagnosis not present

## 2021-12-05 DIAGNOSIS — R627 Adult failure to thrive: Secondary | ICD-10-CM | POA: Diagnosis not present

## 2021-12-05 DIAGNOSIS — I5042 Chronic combined systolic (congestive) and diastolic (congestive) heart failure: Secondary | ICD-10-CM | POA: Diagnosis not present

## 2021-12-07 DIAGNOSIS — R197 Diarrhea, unspecified: Secondary | ICD-10-CM | POA: Diagnosis not present

## 2021-12-26 DIAGNOSIS — R1909 Other intra-abdominal and pelvic swelling, mass and lump: Secondary | ICD-10-CM | POA: Diagnosis not present

## 2021-12-27 DIAGNOSIS — I10 Essential (primary) hypertension: Secondary | ICD-10-CM | POA: Diagnosis not present

## 2021-12-27 DIAGNOSIS — E119 Type 2 diabetes mellitus without complications: Secondary | ICD-10-CM | POA: Diagnosis not present

## 2022-01-11 ENCOUNTER — Ambulatory Visit
Admission: RE | Admit: 2022-01-11 | Discharge: 2022-01-11 | Disposition: A | Discharge status: Home / Self Care | Payer: Medicare Other | Source: Ambulatory Visit | Attending: Physician Assistant | Admitting: Physician Assistant

## 2022-01-11 ENCOUNTER — Other Ambulatory Visit: Payer: Self-pay | Admitting: Physician Assistant

## 2022-01-11 DIAGNOSIS — R590 Localized enlarged lymph nodes: Secondary | ICD-10-CM

## 2022-01-11 DIAGNOSIS — R928 Other abnormal and inconclusive findings on diagnostic imaging of breast: Secondary | ICD-10-CM | POA: Diagnosis not present

## 2022-01-11 DIAGNOSIS — N6012 Diffuse cystic mastopathy of left breast: Secondary | ICD-10-CM | POA: Diagnosis not present

## 2022-01-11 DIAGNOSIS — N6011 Diffuse cystic mastopathy of right breast: Secondary | ICD-10-CM | POA: Diagnosis not present

## 2022-01-16 DIAGNOSIS — R1909 Other intra-abdominal and pelvic swelling, mass and lump: Secondary | ICD-10-CM | POA: Diagnosis not present

## 2022-01-22 ENCOUNTER — Encounter: Payer: Self-pay | Admitting: Nurse Practitioner

## 2022-01-22 ENCOUNTER — Non-Acute Institutional Stay: Payer: Medicare Other | Admitting: Nurse Practitioner

## 2022-01-22 VITALS — BP 132/81 | HR 61 | Temp 97.6°F | Resp 18 | Wt 111.7 lb

## 2022-01-22 DIAGNOSIS — R0602 Shortness of breath: Secondary | ICD-10-CM | POA: Diagnosis not present

## 2022-01-22 DIAGNOSIS — R634 Abnormal weight loss: Secondary | ICD-10-CM | POA: Diagnosis not present

## 2022-01-22 DIAGNOSIS — R630 Anorexia: Secondary | ICD-10-CM

## 2022-01-22 DIAGNOSIS — R5381 Other malaise: Secondary | ICD-10-CM

## 2022-01-22 DIAGNOSIS — Z515 Encounter for palliative care: Secondary | ICD-10-CM | POA: Diagnosis not present

## 2022-01-22 NOTE — Progress Notes (Signed)
[facility] Consult Note Telephone: [phone]  Fax: [phone]    Date of encounter: 01/22/22 3:15 PM PATIENT NAME: Erica Mueller [address] [place] [address]   [phone] (home)  DOB: [birth year] MRN: [id number] PRIMARY CARE PROVIDER:    [place],  [place] [address] [phone]  RESPONSIBLE PARTY:    Contact Information     Name Relation Home Work Mobile   [facility] Mueller Son   [phone]   [facility] Brother   [phone]   Erica Mueller [phone]     Erica Mueller, Erica Mueller Granddaughter   [phone]       I met face to face with patient in facility. Palliative Care was asked to follow this patient by consultation request of  Erica Mueller to address advance care planning and complex medical decision making. This is a follow up visit.                                   ASSESSMENT AND PLAN / RECOMMENDATIONS:  Symptom Management/Plan: 1. Advance Care Planning; Full code pending further discussions with family for clarification of medical goals   2. Shortness of breath stable secondary to CHF, currently stable. Continue to monitor respiratory symptoms, continue weights, monitoring edema. Continue outpatient cardiology appointments. Currently not on diuretic   3. Anorexia; Weight gain, continue to monitor weights, encourage to eat, emotional support  08/15/2021 weight 108.2 lbs 11/08/2021 weight 110.8 lbs 11/15/2021 weight 112.1 lbs 01/12/2022 weight 111.7 lbs   4. Palliative care encounter; Palliative care encounter; Palliative medicine team will continue to support patient, patient's family, and medical team. Visit consisted of counseling and education dealing with the complex and emotionally intense issues of symptom management and palliative care in the setting of serious and potentially life-threatening illness   5. f/u 4 weeks for ongoing monitoring chronic disease progression,  ongoing discussions complex medical decision making Follow up Palliative Care Visit: Palliative care will continue to follow for complex medical decision making, advance care planning, and clarification of goals. Return 4 weeks or prn.   I spent 36 minutes providing this consultation starting at 2:45pm. More than 50% of the time in this consultation was spent in counseling and care coordination. PPS: 40% Chief Complaint: Follow up palliative consult for complex medical decision making   HISTORY OF PRESENT ILLNESS:  Erica Mueller is a 71 y.o. year old female  with multiple medical problems including CAD, DM, HTN, GERD, syphilis, schizophrenia, depression. Erica Mueller resides at SNF at [facility]. Erica Mueller does require assistance with bathing, dressing prompting; toileting. No recent falls, wounds, hospitalizations per staff. At present Erica Mueller is ambulating  in the hall, trying to push another patient in recliner. Appears comfortable.  Erica Mueller limited with interaction with cognitive impairment. Erica Mueller had steady gait. Per nursing no recent falls, wounds, hospitalizations, infections.  Erica Mueller was cooperative with assessment. Limited discussion with cognitive impairment, attempted to reach sister for update pc visit, no changes recommended. Medical goals reviewed. Will continue to follow. Therapeutic listening, emotional support provided. Questions answered.    History obtained from review of EMR, discussion with facility staff and Erica Mueller.  I reviewed available labs, medications, imaging, studies and related documents from the EMR.  Records reviewed and summarized above.    ROS 10 point system reviewed with facility staff and Erica Mueller all negative exept HPI  Physical Exam: Constitutional: NAD General: frail appearing, pleasant female EYES: lids intact ENMT: oral mucous membranes moist CV: S1S2, RRR Pulmonary: LCTA, no increased work of breathing, no cough, room air Abdomen: soft and non  tender MSK: ambulatory Skin: warm and dry Neuro:  + generalized weakness,  + cognitive impairment Psych: non-anxious affect, Alert   Thank you for the opportunity to participate in the care of Erica Mueller.  The palliative care team will continue to follow. Please call our office at [phone] if we can be of additional assistance.    Ihor Gully, NP

## 2022-02-09 DIAGNOSIS — Z7984 Long term (current) use of oral hypoglycemic drugs: Secondary | ICD-10-CM | POA: Diagnosis not present

## 2022-02-09 DIAGNOSIS — L603 Nail dystrophy: Secondary | ICD-10-CM | POA: Diagnosis not present

## 2022-02-09 DIAGNOSIS — B351 Tinea unguium: Secondary | ICD-10-CM | POA: Diagnosis not present

## 2022-02-09 DIAGNOSIS — E1151 Type 2 diabetes mellitus with diabetic peripheral angiopathy without gangrene: Secondary | ICD-10-CM | POA: Diagnosis not present

## 2022-02-22 DIAGNOSIS — F32A Depression, unspecified: Secondary | ICD-10-CM | POA: Diagnosis not present

## 2022-02-26 ENCOUNTER — Non-Acute Institutional Stay: Payer: Medicare Other | Admitting: Nurse Practitioner

## 2022-02-26 ENCOUNTER — Encounter: Payer: Self-pay | Admitting: Nurse Practitioner

## 2022-02-26 VITALS — BP 132/81 | HR 61 | Temp 97.6°F | Resp 18 | Wt 111.3 lb

## 2022-02-26 DIAGNOSIS — I509 Heart failure, unspecified: Secondary | ICD-10-CM

## 2022-02-26 DIAGNOSIS — R634 Abnormal weight loss: Secondary | ICD-10-CM | POA: Diagnosis not present

## 2022-02-26 DIAGNOSIS — R0602 Shortness of breath: Secondary | ICD-10-CM | POA: Diagnosis not present

## 2022-02-26 DIAGNOSIS — Z515 Encounter for palliative care: Secondary | ICD-10-CM | POA: Diagnosis not present

## 2022-02-26 DIAGNOSIS — R630 Anorexia: Secondary | ICD-10-CM

## 2022-02-26 NOTE — Progress Notes (Signed)
[facility] Consult Note Telephone: [phone]  Fax: [phone]    Date of encounter: 02/26/22 1:29 PM PATIENT NAME: Erica Mueller [address] [place] [address]   [phone] (home)  DOB: 1951-04-04 MRN: [id number] PRIMARY CARE PROVIDER:    [facility]  RESPONSIBLE PARTY:    Contact Information     Name Relation Home Work Mobile   [place] Son   [phone]   [facility] Brother   [phone]   Drinda Butts [phone]     Kollins, Fenter Granddaughter   [phone]         I met face to face with patient in facility. Palliative Care was asked to follow this patient by consultation request of  [place] to address advance care planning and complex medical decision making. This is a follow up visit.                                   ASSESSMENT AND PLAN / RECOMMENDATIONS:  Symptom Management/Plan: 1. Advance Care Planning; Full code pending further discussions with family for clarification of medical goals   2. Shortness of breath stable secondary to CHF, currently stable. Continue to monitor respiratory symptoms, continue weights, monitoring edema. Continue outpatient cardiology appointments. Currently not on diuretic   3. Anorexia; Weight gain, continue to monitor weights, encourage to eat, emotional support  08/15/2021 weight 108.2 lbs 11/08/2021 weight 110.8 lbs 11/15/2021 weight 112.1 lbs 01/12/2022 weight 111.7 lbs 02/13/2022 weight 111.3 lbs 4. Palliative care encounter; Palliative care encounter; Palliative medicine team will continue to support patient, patient's family, and medical team. Visit consisted of counseling and education dealing with the complex and emotionally intense issues of symptom management and palliative care in the setting of serious and potentially life-threatening illness   5. f/u 4 weeks for ongoing monitoring chronic disease progression, ongoing discussions complex  medical decision making Follow up Palliative Care Visit: Palliative care will continue to follow for complex medical decision making, advance care planning, and clarification of goals. Return 4 weeks or prn.   I spent 35 minutes providing this consultation starting at 1:15 pm. More than 50% of the time in this consultation was spent in counseling and care coordination. PPS: 40% Chief Complaint: Follow up palliative consult for complex medical decision making   HISTORY OF PRESENT ILLNESS:  Erica Mueller is a 71 y.o. year old female  with multiple medical problems including CAD, DM, HTN, GERD, syphilis, schizophrenia, depression. Erica Mueller resides at SNF at [facility]. Erica Mueller does require assistance with bathing, dressing prompting; toileting. Erica Mueller per staff no new changes, no falls, wounds, infections, hospitalization. Erica Mueller is ambulatory, re-directable. At present Erica Mueller is sitting in the dining area waiting for her lunch with other residents, not interacting. Erica Mueller does make eye contact, says words but not answering questions. Erica Mueller was quiet. Observed Erica Mueller feeding herself. Erica Mueller was cooperative with assessment though no meaningful discussion with cognitive impairment. Support provided. Medical goals reviewed. I attempted to contact sister, updated staff no new changes to poc.    History obtained from review of EMR, discussion with facility staff and Erica Mueller.  I reviewed available labs, medications, imaging, studies and related documents from the EMR.  Records reviewed and summarized above.    ROS 10 point system reviewed with facility staff and Erica Mueller all negative exept HPI  Physical Exam: Constitutional: NAD General: frail appearing, pleasant female EYES: lids intact ENMT: oral mucous membranes moist CV: S1S2, RRR Pulmonary: LCTA, no increased work of breathing, no cough, room air Abdomen: soft and non tender MSK: ambulatory Skin: warm and dry Neuro:  + generalized  weakness,  + cognitive impairment Psych: non-anxious affect, Alert   Thank you for the opportunity to participate in the care of Erica Mueller.  The palliative care team will continue to follow. Please call our office at [phone] if we can be of additional assistance.   Erica [place] Ihor Gully, NP

## 2022-02-27 DIAGNOSIS — I5042 Chronic combined systolic (congestive) and diastolic (congestive) heart failure: Secondary | ICD-10-CM | POA: Diagnosis not present

## 2022-02-28 DIAGNOSIS — I5042 Chronic combined systolic (congestive) and diastolic (congestive) heart failure: Secondary | ICD-10-CM | POA: Diagnosis not present

## 2022-03-01 DIAGNOSIS — I5042 Chronic combined systolic (congestive) and diastolic (congestive) heart failure: Secondary | ICD-10-CM | POA: Diagnosis not present

## 2022-03-02 DIAGNOSIS — I5042 Chronic combined systolic (congestive) and diastolic (congestive) heart failure: Secondary | ICD-10-CM | POA: Diagnosis not present

## 2022-03-05 DIAGNOSIS — I5042 Chronic combined systolic (congestive) and diastolic (congestive) heart failure: Secondary | ICD-10-CM | POA: Diagnosis not present

## 2022-03-06 DIAGNOSIS — E1165 Type 2 diabetes mellitus with hyperglycemia: Secondary | ICD-10-CM | POA: Diagnosis not present

## 2022-03-06 DIAGNOSIS — I5042 Chronic combined systolic (congestive) and diastolic (congestive) heart failure: Secondary | ICD-10-CM | POA: Diagnosis not present

## 2022-03-06 DIAGNOSIS — I10 Essential (primary) hypertension: Secondary | ICD-10-CM | POA: Diagnosis not present

## 2022-03-06 DIAGNOSIS — E785 Hyperlipidemia, unspecified: Secondary | ICD-10-CM | POA: Diagnosis not present

## 2022-03-07 DIAGNOSIS — I5042 Chronic combined systolic (congestive) and diastolic (congestive) heart failure: Secondary | ICD-10-CM | POA: Diagnosis not present

## 2022-03-08 DIAGNOSIS — G3184 Mild cognitive impairment, so stated: Secondary | ICD-10-CM | POA: Diagnosis not present

## 2022-03-08 DIAGNOSIS — I5042 Chronic combined systolic (congestive) and diastolic (congestive) heart failure: Secondary | ICD-10-CM | POA: Diagnosis not present

## 2022-03-09 DIAGNOSIS — I5042 Chronic combined systolic (congestive) and diastolic (congestive) heart failure: Secondary | ICD-10-CM | POA: Diagnosis not present

## 2022-03-12 DIAGNOSIS — A419 Sepsis, unspecified organism: Secondary | ICD-10-CM | POA: Diagnosis not present

## 2022-03-12 DIAGNOSIS — Z23 Encounter for immunization: Secondary | ICD-10-CM | POA: Diagnosis not present

## 2022-03-12 DIAGNOSIS — M6281 Muscle weakness (generalized): Secondary | ICD-10-CM | POA: Diagnosis not present

## 2022-03-12 DIAGNOSIS — R1312 Dysphagia, oropharyngeal phase: Secondary | ICD-10-CM | POA: Diagnosis not present

## 2022-03-13 DIAGNOSIS — R1312 Dysphagia, oropharyngeal phase: Secondary | ICD-10-CM | POA: Diagnosis not present

## 2022-03-13 DIAGNOSIS — M6281 Muscle weakness (generalized): Secondary | ICD-10-CM | POA: Diagnosis not present

## 2022-03-13 DIAGNOSIS — A419 Sepsis, unspecified organism: Secondary | ICD-10-CM | POA: Diagnosis not present

## 2022-03-13 DIAGNOSIS — Z23 Encounter for immunization: Secondary | ICD-10-CM | POA: Diagnosis not present

## 2022-03-14 DIAGNOSIS — Z23 Encounter for immunization: Secondary | ICD-10-CM | POA: Diagnosis not present

## 2022-03-14 DIAGNOSIS — A419 Sepsis, unspecified organism: Secondary | ICD-10-CM | POA: Diagnosis not present

## 2022-03-14 DIAGNOSIS — M6281 Muscle weakness (generalized): Secondary | ICD-10-CM | POA: Diagnosis not present

## 2022-03-14 DIAGNOSIS — R1312 Dysphagia, oropharyngeal phase: Secondary | ICD-10-CM | POA: Diagnosis not present

## 2022-03-15 DIAGNOSIS — Z23 Encounter for immunization: Secondary | ICD-10-CM | POA: Diagnosis not present

## 2022-03-15 DIAGNOSIS — A419 Sepsis, unspecified organism: Secondary | ICD-10-CM | POA: Diagnosis not present

## 2022-03-15 DIAGNOSIS — M6281 Muscle weakness (generalized): Secondary | ICD-10-CM | POA: Diagnosis not present

## 2022-03-15 DIAGNOSIS — R1312 Dysphagia, oropharyngeal phase: Secondary | ICD-10-CM | POA: Diagnosis not present

## 2022-03-16 DIAGNOSIS — R1312 Dysphagia, oropharyngeal phase: Secondary | ICD-10-CM | POA: Diagnosis not present

## 2022-03-16 DIAGNOSIS — M6281 Muscle weakness (generalized): Secondary | ICD-10-CM | POA: Diagnosis not present

## 2022-03-16 DIAGNOSIS — A419 Sepsis, unspecified organism: Secondary | ICD-10-CM | POA: Diagnosis not present

## 2022-03-16 DIAGNOSIS — Z23 Encounter for immunization: Secondary | ICD-10-CM | POA: Diagnosis not present

## 2022-03-19 DIAGNOSIS — Z23 Encounter for immunization: Secondary | ICD-10-CM | POA: Diagnosis not present

## 2022-03-19 DIAGNOSIS — M6281 Muscle weakness (generalized): Secondary | ICD-10-CM | POA: Diagnosis not present

## 2022-03-19 DIAGNOSIS — A419 Sepsis, unspecified organism: Secondary | ICD-10-CM | POA: Diagnosis not present

## 2022-03-19 DIAGNOSIS — R1312 Dysphagia, oropharyngeal phase: Secondary | ICD-10-CM | POA: Diagnosis not present

## 2022-03-20 DIAGNOSIS — Z23 Encounter for immunization: Secondary | ICD-10-CM | POA: Diagnosis not present

## 2022-03-20 DIAGNOSIS — R1312 Dysphagia, oropharyngeal phase: Secondary | ICD-10-CM | POA: Diagnosis not present

## 2022-03-20 DIAGNOSIS — A419 Sepsis, unspecified organism: Secondary | ICD-10-CM | POA: Diagnosis not present

## 2022-03-20 DIAGNOSIS — M6281 Muscle weakness (generalized): Secondary | ICD-10-CM | POA: Diagnosis not present

## 2022-03-21 DIAGNOSIS — A419 Sepsis, unspecified organism: Secondary | ICD-10-CM | POA: Diagnosis not present

## 2022-03-21 DIAGNOSIS — R1312 Dysphagia, oropharyngeal phase: Secondary | ICD-10-CM | POA: Diagnosis not present

## 2022-03-21 DIAGNOSIS — M6281 Muscle weakness (generalized): Secondary | ICD-10-CM | POA: Diagnosis not present

## 2022-03-21 DIAGNOSIS — Z23 Encounter for immunization: Secondary | ICD-10-CM | POA: Diagnosis not present

## 2022-03-22 DIAGNOSIS — Z23 Encounter for immunization: Secondary | ICD-10-CM | POA: Diagnosis not present

## 2022-03-22 DIAGNOSIS — R2689 Other abnormalities of gait and mobility: Secondary | ICD-10-CM | POA: Diagnosis not present

## 2022-03-22 DIAGNOSIS — R1312 Dysphagia, oropharyngeal phase: Secondary | ICD-10-CM | POA: Diagnosis not present

## 2022-03-22 DIAGNOSIS — M6281 Muscle weakness (generalized): Secondary | ICD-10-CM | POA: Diagnosis not present

## 2022-03-22 DIAGNOSIS — A419 Sepsis, unspecified organism: Secondary | ICD-10-CM | POA: Diagnosis not present

## 2022-03-23 DIAGNOSIS — M25551 Pain in right hip: Secondary | ICD-10-CM | POA: Diagnosis not present

## 2022-03-23 DIAGNOSIS — M25552 Pain in left hip: Secondary | ICD-10-CM | POA: Diagnosis not present

## 2022-03-27 DIAGNOSIS — D638 Anemia in other chronic diseases classified elsewhere: Secondary | ICD-10-CM | POA: Diagnosis not present

## 2022-03-27 DIAGNOSIS — E1169 Type 2 diabetes mellitus with other specified complication: Secondary | ICD-10-CM | POA: Diagnosis not present

## 2022-03-28 DIAGNOSIS — Z23 Encounter for immunization: Secondary | ICD-10-CM | POA: Diagnosis not present

## 2022-03-28 DIAGNOSIS — M6281 Muscle weakness (generalized): Secondary | ICD-10-CM | POA: Diagnosis not present

## 2022-03-28 DIAGNOSIS — I10 Essential (primary) hypertension: Secondary | ICD-10-CM | POA: Diagnosis not present

## 2022-03-28 DIAGNOSIS — R1312 Dysphagia, oropharyngeal phase: Secondary | ICD-10-CM | POA: Diagnosis not present

## 2022-03-28 DIAGNOSIS — A419 Sepsis, unspecified organism: Secondary | ICD-10-CM | POA: Diagnosis not present

## 2022-03-28 DIAGNOSIS — E119 Type 2 diabetes mellitus without complications: Secondary | ICD-10-CM | POA: Diagnosis not present

## 2022-04-06 DIAGNOSIS — R140 Abdominal distension (gaseous): Secondary | ICD-10-CM | POA: Diagnosis not present

## 2022-04-06 DIAGNOSIS — K5900 Constipation, unspecified: Secondary | ICD-10-CM | POA: Diagnosis not present

## 2022-04-06 DIAGNOSIS — R109 Unspecified abdominal pain: Secondary | ICD-10-CM | POA: Diagnosis not present

## 2022-04-06 DIAGNOSIS — R1915 Other abnormal bowel sounds: Secondary | ICD-10-CM | POA: Diagnosis not present

## 2022-04-09 DIAGNOSIS — K5900 Constipation, unspecified: Secondary | ICD-10-CM | POA: Diagnosis not present

## 2022-04-12 DIAGNOSIS — I5042 Chronic combined systolic (congestive) and diastolic (congestive) heart failure: Secondary | ICD-10-CM | POA: Diagnosis not present

## 2022-04-12 DIAGNOSIS — Z741 Need for assistance with personal care: Secondary | ICD-10-CM | POA: Diagnosis not present

## 2022-04-12 DIAGNOSIS — M6281 Muscle weakness (generalized): Secondary | ICD-10-CM | POA: Diagnosis not present

## 2022-04-13 DIAGNOSIS — M6281 Muscle weakness (generalized): Secondary | ICD-10-CM | POA: Diagnosis not present

## 2022-04-13 DIAGNOSIS — I5042 Chronic combined systolic (congestive) and diastolic (congestive) heart failure: Secondary | ICD-10-CM | POA: Diagnosis not present

## 2022-04-13 DIAGNOSIS — Z741 Need for assistance with personal care: Secondary | ICD-10-CM | POA: Diagnosis not present

## 2022-04-16 DIAGNOSIS — M6281 Muscle weakness (generalized): Secondary | ICD-10-CM | POA: Diagnosis not present

## 2022-04-16 DIAGNOSIS — I5042 Chronic combined systolic (congestive) and diastolic (congestive) heart failure: Secondary | ICD-10-CM | POA: Diagnosis not present

## 2022-04-16 DIAGNOSIS — Z741 Need for assistance with personal care: Secondary | ICD-10-CM | POA: Diagnosis not present

## 2022-04-17 DIAGNOSIS — Z741 Need for assistance with personal care: Secondary | ICD-10-CM | POA: Diagnosis not present

## 2022-04-17 DIAGNOSIS — M6281 Muscle weakness (generalized): Secondary | ICD-10-CM | POA: Diagnosis not present

## 2022-04-17 DIAGNOSIS — I5042 Chronic combined systolic (congestive) and diastolic (congestive) heart failure: Secondary | ICD-10-CM | POA: Diagnosis not present

## 2022-04-18 DIAGNOSIS — Z741 Need for assistance with personal care: Secondary | ICD-10-CM | POA: Diagnosis not present

## 2022-04-18 DIAGNOSIS — M6281 Muscle weakness (generalized): Secondary | ICD-10-CM | POA: Diagnosis not present

## 2022-04-18 DIAGNOSIS — I5042 Chronic combined systolic (congestive) and diastolic (congestive) heart failure: Secondary | ICD-10-CM | POA: Diagnosis not present

## 2022-04-19 DIAGNOSIS — Z741 Need for assistance with personal care: Secondary | ICD-10-CM | POA: Diagnosis not present

## 2022-04-19 DIAGNOSIS — I5042 Chronic combined systolic (congestive) and diastolic (congestive) heart failure: Secondary | ICD-10-CM | POA: Diagnosis not present

## 2022-04-19 DIAGNOSIS — M6281 Muscle weakness (generalized): Secondary | ICD-10-CM | POA: Diagnosis not present

## 2022-04-20 DIAGNOSIS — Z741 Need for assistance with personal care: Secondary | ICD-10-CM | POA: Diagnosis not present

## 2022-04-20 DIAGNOSIS — I5042 Chronic combined systolic (congestive) and diastolic (congestive) heart failure: Secondary | ICD-10-CM | POA: Diagnosis not present

## 2022-04-20 DIAGNOSIS — M6281 Muscle weakness (generalized): Secondary | ICD-10-CM | POA: Diagnosis not present

## 2022-04-23 DIAGNOSIS — Z741 Need for assistance with personal care: Secondary | ICD-10-CM | POA: Diagnosis not present

## 2022-04-23 DIAGNOSIS — I5042 Chronic combined systolic (congestive) and diastolic (congestive) heart failure: Secondary | ICD-10-CM | POA: Diagnosis not present

## 2022-04-23 DIAGNOSIS — M6281 Muscle weakness (generalized): Secondary | ICD-10-CM | POA: Diagnosis not present

## 2022-04-24 DIAGNOSIS — M6281 Muscle weakness (generalized): Secondary | ICD-10-CM | POA: Diagnosis not present

## 2022-04-24 DIAGNOSIS — I5042 Chronic combined systolic (congestive) and diastolic (congestive) heart failure: Secondary | ICD-10-CM | POA: Diagnosis not present

## 2022-04-24 DIAGNOSIS — Z741 Need for assistance with personal care: Secondary | ICD-10-CM | POA: Diagnosis not present

## 2022-04-25 DIAGNOSIS — M6281 Muscle weakness (generalized): Secondary | ICD-10-CM | POA: Diagnosis not present

## 2022-04-25 DIAGNOSIS — Z741 Need for assistance with personal care: Secondary | ICD-10-CM | POA: Diagnosis not present

## 2022-04-25 DIAGNOSIS — I5042 Chronic combined systolic (congestive) and diastolic (congestive) heart failure: Secondary | ICD-10-CM | POA: Diagnosis not present

## 2022-04-26 DIAGNOSIS — Z741 Need for assistance with personal care: Secondary | ICD-10-CM | POA: Diagnosis not present

## 2022-04-26 DIAGNOSIS — M6281 Muscle weakness (generalized): Secondary | ICD-10-CM | POA: Diagnosis not present

## 2022-04-26 DIAGNOSIS — I5042 Chronic combined systolic (congestive) and diastolic (congestive) heart failure: Secondary | ICD-10-CM | POA: Diagnosis not present

## 2022-04-27 DIAGNOSIS — M6281 Muscle weakness (generalized): Secondary | ICD-10-CM | POA: Diagnosis not present

## 2022-04-27 DIAGNOSIS — E1151 Type 2 diabetes mellitus with diabetic peripheral angiopathy without gangrene: Secondary | ICD-10-CM | POA: Diagnosis not present

## 2022-04-27 DIAGNOSIS — B351 Tinea unguium: Secondary | ICD-10-CM | POA: Diagnosis not present

## 2022-04-27 DIAGNOSIS — Z741 Need for assistance with personal care: Secondary | ICD-10-CM | POA: Diagnosis not present

## 2022-04-27 DIAGNOSIS — L603 Nail dystrophy: Secondary | ICD-10-CM | POA: Diagnosis not present

## 2022-04-27 DIAGNOSIS — I5042 Chronic combined systolic (congestive) and diastolic (congestive) heart failure: Secondary | ICD-10-CM | POA: Diagnosis not present

## 2022-04-27 DIAGNOSIS — Z7984 Long term (current) use of oral hypoglycemic drugs: Secondary | ICD-10-CM | POA: Diagnosis not present

## 2022-04-30 DIAGNOSIS — Z741 Need for assistance with personal care: Secondary | ICD-10-CM | POA: Diagnosis not present

## 2022-04-30 DIAGNOSIS — I5042 Chronic combined systolic (congestive) and diastolic (congestive) heart failure: Secondary | ICD-10-CM | POA: Diagnosis not present

## 2022-04-30 DIAGNOSIS — M6281 Muscle weakness (generalized): Secondary | ICD-10-CM | POA: Diagnosis not present

## 2022-05-01 DIAGNOSIS — M6281 Muscle weakness (generalized): Secondary | ICD-10-CM | POA: Diagnosis not present

## 2022-05-01 DIAGNOSIS — Z741 Need for assistance with personal care: Secondary | ICD-10-CM | POA: Diagnosis not present

## 2022-05-01 DIAGNOSIS — I5042 Chronic combined systolic (congestive) and diastolic (congestive) heart failure: Secondary | ICD-10-CM | POA: Diagnosis not present

## 2022-05-02 DIAGNOSIS — M6281 Muscle weakness (generalized): Secondary | ICD-10-CM | POA: Diagnosis not present

## 2022-05-02 DIAGNOSIS — Z741 Need for assistance with personal care: Secondary | ICD-10-CM | POA: Diagnosis not present

## 2022-05-02 DIAGNOSIS — I5042 Chronic combined systolic (congestive) and diastolic (congestive) heart failure: Secondary | ICD-10-CM | POA: Diagnosis not present

## 2022-05-03 DIAGNOSIS — Z741 Need for assistance with personal care: Secondary | ICD-10-CM | POA: Diagnosis not present

## 2022-05-03 DIAGNOSIS — M6281 Muscle weakness (generalized): Secondary | ICD-10-CM | POA: Diagnosis not present

## 2022-05-03 DIAGNOSIS — I5042 Chronic combined systolic (congestive) and diastolic (congestive) heart failure: Secondary | ICD-10-CM | POA: Diagnosis not present

## 2022-05-04 DIAGNOSIS — Z741 Need for assistance with personal care: Secondary | ICD-10-CM | POA: Diagnosis not present

## 2022-05-04 DIAGNOSIS — I5042 Chronic combined systolic (congestive) and diastolic (congestive) heart failure: Secondary | ICD-10-CM | POA: Diagnosis not present

## 2022-05-04 DIAGNOSIS — M6281 Muscle weakness (generalized): Secondary | ICD-10-CM | POA: Diagnosis not present

## 2022-05-07 DIAGNOSIS — I5042 Chronic combined systolic (congestive) and diastolic (congestive) heart failure: Secondary | ICD-10-CM | POA: Diagnosis not present

## 2022-05-07 DIAGNOSIS — Z741 Need for assistance with personal care: Secondary | ICD-10-CM | POA: Diagnosis not present

## 2022-05-07 DIAGNOSIS — M6281 Muscle weakness (generalized): Secondary | ICD-10-CM | POA: Diagnosis not present

## 2022-05-08 DIAGNOSIS — M6281 Muscle weakness (generalized): Secondary | ICD-10-CM | POA: Diagnosis not present

## 2022-05-08 DIAGNOSIS — I5042 Chronic combined systolic (congestive) and diastolic (congestive) heart failure: Secondary | ICD-10-CM | POA: Diagnosis not present

## 2022-05-08 DIAGNOSIS — Z741 Need for assistance with personal care: Secondary | ICD-10-CM | POA: Diagnosis not present

## 2022-05-09 DIAGNOSIS — M6281 Muscle weakness (generalized): Secondary | ICD-10-CM | POA: Diagnosis not present

## 2022-05-09 DIAGNOSIS — I5042 Chronic combined systolic (congestive) and diastolic (congestive) heart failure: Secondary | ICD-10-CM | POA: Diagnosis not present

## 2022-05-09 DIAGNOSIS — Z741 Need for assistance with personal care: Secondary | ICD-10-CM | POA: Diagnosis not present

## 2022-05-10 DIAGNOSIS — Z741 Need for assistance with personal care: Secondary | ICD-10-CM | POA: Diagnosis not present

## 2022-05-10 DIAGNOSIS — I5042 Chronic combined systolic (congestive) and diastolic (congestive) heart failure: Secondary | ICD-10-CM | POA: Diagnosis not present

## 2022-05-10 DIAGNOSIS — M6281 Muscle weakness (generalized): Secondary | ICD-10-CM | POA: Diagnosis not present

## 2022-05-11 DIAGNOSIS — I5042 Chronic combined systolic (congestive) and diastolic (congestive) heart failure: Secondary | ICD-10-CM | POA: Diagnosis not present

## 2022-05-11 DIAGNOSIS — Z741 Need for assistance with personal care: Secondary | ICD-10-CM | POA: Diagnosis not present

## 2022-05-11 DIAGNOSIS — M6281 Muscle weakness (generalized): Secondary | ICD-10-CM | POA: Diagnosis not present

## 2022-05-14 DIAGNOSIS — Z741 Need for assistance with personal care: Secondary | ICD-10-CM | POA: Diagnosis not present

## 2022-05-14 DIAGNOSIS — I5042 Chronic combined systolic (congestive) and diastolic (congestive) heart failure: Secondary | ICD-10-CM | POA: Diagnosis not present

## 2022-05-14 DIAGNOSIS — M6281 Muscle weakness (generalized): Secondary | ICD-10-CM | POA: Diagnosis not present

## 2022-05-15 DIAGNOSIS — Z741 Need for assistance with personal care: Secondary | ICD-10-CM | POA: Diagnosis not present

## 2022-05-15 DIAGNOSIS — I5042 Chronic combined systolic (congestive) and diastolic (congestive) heart failure: Secondary | ICD-10-CM | POA: Diagnosis not present

## 2022-05-15 DIAGNOSIS — M6281 Muscle weakness (generalized): Secondary | ICD-10-CM | POA: Diagnosis not present

## 2022-05-16 DIAGNOSIS — I5042 Chronic combined systolic (congestive) and diastolic (congestive) heart failure: Secondary | ICD-10-CM | POA: Diagnosis not present

## 2022-05-16 DIAGNOSIS — Z741 Need for assistance with personal care: Secondary | ICD-10-CM | POA: Diagnosis not present

## 2022-05-16 DIAGNOSIS — M6281 Muscle weakness (generalized): Secondary | ICD-10-CM | POA: Diagnosis not present

## 2022-05-17 DIAGNOSIS — Z741 Need for assistance with personal care: Secondary | ICD-10-CM | POA: Diagnosis not present

## 2022-05-17 DIAGNOSIS — I5042 Chronic combined systolic (congestive) and diastolic (congestive) heart failure: Secondary | ICD-10-CM | POA: Diagnosis not present

## 2022-05-17 DIAGNOSIS — I5020 Unspecified systolic (congestive) heart failure: Secondary | ICD-10-CM | POA: Diagnosis not present

## 2022-05-17 DIAGNOSIS — I351 Nonrheumatic aortic (valve) insufficiency: Secondary | ICD-10-CM | POA: Diagnosis not present

## 2022-05-17 DIAGNOSIS — E785 Hyperlipidemia, unspecified: Secondary | ICD-10-CM | POA: Diagnosis not present

## 2022-05-17 DIAGNOSIS — M6281 Muscle weakness (generalized): Secondary | ICD-10-CM | POA: Diagnosis not present

## 2022-05-17 DIAGNOSIS — E1169 Type 2 diabetes mellitus with other specified complication: Secondary | ICD-10-CM | POA: Diagnosis not present

## 2022-06-18 DIAGNOSIS — R2689 Other abnormalities of gait and mobility: Secondary | ICD-10-CM | POA: Diagnosis not present

## 2022-06-18 DIAGNOSIS — M6281 Muscle weakness (generalized): Secondary | ICD-10-CM | POA: Diagnosis not present

## 2022-06-18 DIAGNOSIS — I5042 Chronic combined systolic (congestive) and diastolic (congestive) heart failure: Secondary | ICD-10-CM | POA: Diagnosis not present

## 2022-06-19 DIAGNOSIS — I5042 Chronic combined systolic (congestive) and diastolic (congestive) heart failure: Secondary | ICD-10-CM | POA: Diagnosis not present

## 2022-06-19 DIAGNOSIS — R2689 Other abnormalities of gait and mobility: Secondary | ICD-10-CM | POA: Diagnosis not present

## 2022-06-19 DIAGNOSIS — M6281 Muscle weakness (generalized): Secondary | ICD-10-CM | POA: Diagnosis not present

## 2022-06-20 DIAGNOSIS — M6281 Muscle weakness (generalized): Secondary | ICD-10-CM | POA: Diagnosis not present

## 2022-06-20 DIAGNOSIS — I5042 Chronic combined systolic (congestive) and diastolic (congestive) heart failure: Secondary | ICD-10-CM | POA: Diagnosis not present

## 2022-06-20 DIAGNOSIS — R2689 Other abnormalities of gait and mobility: Secondary | ICD-10-CM | POA: Diagnosis not present

## 2022-06-21 DIAGNOSIS — I5042 Chronic combined systolic (congestive) and diastolic (congestive) heart failure: Secondary | ICD-10-CM | POA: Diagnosis not present

## 2022-06-21 DIAGNOSIS — R2689 Other abnormalities of gait and mobility: Secondary | ICD-10-CM | POA: Diagnosis not present

## 2022-06-21 DIAGNOSIS — M6281 Muscle weakness (generalized): Secondary | ICD-10-CM | POA: Diagnosis not present

## 2022-06-22 DIAGNOSIS — M6281 Muscle weakness (generalized): Secondary | ICD-10-CM | POA: Diagnosis not present

## 2022-06-22 DIAGNOSIS — I5042 Chronic combined systolic (congestive) and diastolic (congestive) heart failure: Secondary | ICD-10-CM | POA: Diagnosis not present

## 2022-06-22 DIAGNOSIS — R2689 Other abnormalities of gait and mobility: Secondary | ICD-10-CM | POA: Diagnosis not present

## 2022-06-25 DIAGNOSIS — R2689 Other abnormalities of gait and mobility: Secondary | ICD-10-CM | POA: Diagnosis not present

## 2022-06-25 DIAGNOSIS — I5042 Chronic combined systolic (congestive) and diastolic (congestive) heart failure: Secondary | ICD-10-CM | POA: Diagnosis not present

## 2022-06-25 DIAGNOSIS — M6281 Muscle weakness (generalized): Secondary | ICD-10-CM | POA: Diagnosis not present

## 2022-06-26 DIAGNOSIS — R2689 Other abnormalities of gait and mobility: Secondary | ICD-10-CM | POA: Diagnosis not present

## 2022-06-26 DIAGNOSIS — I5042 Chronic combined systolic (congestive) and diastolic (congestive) heart failure: Secondary | ICD-10-CM | POA: Diagnosis not present

## 2022-06-26 DIAGNOSIS — M6281 Muscle weakness (generalized): Secondary | ICD-10-CM | POA: Diagnosis not present

## 2022-06-27 DIAGNOSIS — I5042 Chronic combined systolic (congestive) and diastolic (congestive) heart failure: Secondary | ICD-10-CM | POA: Diagnosis not present

## 2022-06-27 DIAGNOSIS — M2042 Other hammer toe(s) (acquired), left foot: Secondary | ICD-10-CM | POA: Diagnosis not present

## 2022-06-27 DIAGNOSIS — M6281 Muscle weakness (generalized): Secondary | ICD-10-CM | POA: Diagnosis not present

## 2022-06-27 DIAGNOSIS — M2041 Other hammer toe(s) (acquired), right foot: Secondary | ICD-10-CM | POA: Diagnosis not present

## 2022-06-27 DIAGNOSIS — L603 Nail dystrophy: Secondary | ICD-10-CM | POA: Diagnosis not present

## 2022-06-27 DIAGNOSIS — E1151 Type 2 diabetes mellitus with diabetic peripheral angiopathy without gangrene: Secondary | ICD-10-CM | POA: Diagnosis not present

## 2022-06-27 DIAGNOSIS — B351 Tinea unguium: Secondary | ICD-10-CM | POA: Diagnosis not present

## 2022-06-27 DIAGNOSIS — R2689 Other abnormalities of gait and mobility: Secondary | ICD-10-CM | POA: Diagnosis not present

## 2022-06-27 DIAGNOSIS — Z7984 Long term (current) use of oral hypoglycemic drugs: Secondary | ICD-10-CM | POA: Diagnosis not present

## 2022-06-28 DIAGNOSIS — I5042 Chronic combined systolic (congestive) and diastolic (congestive) heart failure: Secondary | ICD-10-CM | POA: Diagnosis not present

## 2022-06-28 DIAGNOSIS — R2689 Other abnormalities of gait and mobility: Secondary | ICD-10-CM | POA: Diagnosis not present

## 2022-06-28 DIAGNOSIS — M6281 Muscle weakness (generalized): Secondary | ICD-10-CM | POA: Diagnosis not present

## 2022-06-29 DIAGNOSIS — R2689 Other abnormalities of gait and mobility: Secondary | ICD-10-CM | POA: Diagnosis not present

## 2022-06-29 DIAGNOSIS — I5042 Chronic combined systolic (congestive) and diastolic (congestive) heart failure: Secondary | ICD-10-CM | POA: Diagnosis not present

## 2022-06-29 DIAGNOSIS — M6281 Muscle weakness (generalized): Secondary | ICD-10-CM | POA: Diagnosis not present

## 2022-07-02 DIAGNOSIS — R2689 Other abnormalities of gait and mobility: Secondary | ICD-10-CM | POA: Diagnosis not present

## 2022-07-02 DIAGNOSIS — M6281 Muscle weakness (generalized): Secondary | ICD-10-CM | POA: Diagnosis not present

## 2022-07-02 DIAGNOSIS — I5042 Chronic combined systolic (congestive) and diastolic (congestive) heart failure: Secondary | ICD-10-CM | POA: Diagnosis not present

## 2022-07-03 DIAGNOSIS — R2689 Other abnormalities of gait and mobility: Secondary | ICD-10-CM | POA: Diagnosis not present

## 2022-07-03 DIAGNOSIS — M6281 Muscle weakness (generalized): Secondary | ICD-10-CM | POA: Diagnosis not present

## 2022-07-03 DIAGNOSIS — I5042 Chronic combined systolic (congestive) and diastolic (congestive) heart failure: Secondary | ICD-10-CM | POA: Diagnosis not present

## 2022-07-04 DIAGNOSIS — E43 Unspecified severe protein-calorie malnutrition: Secondary | ICD-10-CM | POA: Diagnosis not present

## 2022-07-04 DIAGNOSIS — I5040 Unspecified combined systolic (congestive) and diastolic (congestive) heart failure: Secondary | ICD-10-CM | POA: Diagnosis not present

## 2022-07-04 DIAGNOSIS — M6281 Muscle weakness (generalized): Secondary | ICD-10-CM | POA: Diagnosis not present

## 2022-07-04 DIAGNOSIS — J449 Chronic obstructive pulmonary disease, unspecified: Secondary | ICD-10-CM | POA: Diagnosis not present

## 2022-07-04 DIAGNOSIS — I5042 Chronic combined systolic (congestive) and diastolic (congestive) heart failure: Secondary | ICD-10-CM | POA: Diagnosis not present

## 2022-07-04 DIAGNOSIS — R2689 Other abnormalities of gait and mobility: Secondary | ICD-10-CM | POA: Diagnosis not present

## 2022-07-05 DIAGNOSIS — M6281 Muscle weakness (generalized): Secondary | ICD-10-CM | POA: Diagnosis not present

## 2022-07-05 DIAGNOSIS — R2689 Other abnormalities of gait and mobility: Secondary | ICD-10-CM | POA: Diagnosis not present

## 2022-07-05 DIAGNOSIS — I5042 Chronic combined systolic (congestive) and diastolic (congestive) heart failure: Secondary | ICD-10-CM | POA: Diagnosis not present

## 2022-07-06 DIAGNOSIS — R2689 Other abnormalities of gait and mobility: Secondary | ICD-10-CM | POA: Diagnosis not present

## 2022-07-06 DIAGNOSIS — M6281 Muscle weakness (generalized): Secondary | ICD-10-CM | POA: Diagnosis not present

## 2022-07-06 DIAGNOSIS — I5042 Chronic combined systolic (congestive) and diastolic (congestive) heart failure: Secondary | ICD-10-CM | POA: Diagnosis not present

## 2022-07-09 DIAGNOSIS — R2689 Other abnormalities of gait and mobility: Secondary | ICD-10-CM | POA: Diagnosis not present

## 2022-07-09 DIAGNOSIS — I5042 Chronic combined systolic (congestive) and diastolic (congestive) heart failure: Secondary | ICD-10-CM | POA: Diagnosis not present

## 2022-07-09 DIAGNOSIS — M6281 Muscle weakness (generalized): Secondary | ICD-10-CM | POA: Diagnosis not present

## 2022-07-10 DIAGNOSIS — M6281 Muscle weakness (generalized): Secondary | ICD-10-CM | POA: Diagnosis not present

## 2022-07-10 DIAGNOSIS — R2689 Other abnormalities of gait and mobility: Secondary | ICD-10-CM | POA: Diagnosis not present

## 2022-07-10 DIAGNOSIS — I5042 Chronic combined systolic (congestive) and diastolic (congestive) heart failure: Secondary | ICD-10-CM | POA: Diagnosis not present

## 2022-07-11 DIAGNOSIS — I5042 Chronic combined systolic (congestive) and diastolic (congestive) heart failure: Secondary | ICD-10-CM | POA: Diagnosis not present

## 2022-07-11 DIAGNOSIS — E785 Hyperlipidemia, unspecified: Secondary | ICD-10-CM | POA: Diagnosis not present

## 2022-07-11 DIAGNOSIS — M6281 Muscle weakness (generalized): Secondary | ICD-10-CM | POA: Diagnosis not present

## 2022-07-11 DIAGNOSIS — E1165 Type 2 diabetes mellitus with hyperglycemia: Secondary | ICD-10-CM | POA: Diagnosis not present

## 2022-07-11 DIAGNOSIS — I10 Essential (primary) hypertension: Secondary | ICD-10-CM | POA: Diagnosis not present

## 2022-07-11 DIAGNOSIS — R2689 Other abnormalities of gait and mobility: Secondary | ICD-10-CM | POA: Diagnosis not present

## 2022-07-12 DIAGNOSIS — R2689 Other abnormalities of gait and mobility: Secondary | ICD-10-CM | POA: Diagnosis not present

## 2022-07-12 DIAGNOSIS — M6281 Muscle weakness (generalized): Secondary | ICD-10-CM | POA: Diagnosis not present

## 2022-07-12 DIAGNOSIS — I5042 Chronic combined systolic (congestive) and diastolic (congestive) heart failure: Secondary | ICD-10-CM | POA: Diagnosis not present

## 2022-07-13 DIAGNOSIS — I5042 Chronic combined systolic (congestive) and diastolic (congestive) heart failure: Secondary | ICD-10-CM | POA: Diagnosis not present

## 2022-07-13 DIAGNOSIS — M6281 Muscle weakness (generalized): Secondary | ICD-10-CM | POA: Diagnosis not present

## 2022-07-13 DIAGNOSIS — R2689 Other abnormalities of gait and mobility: Secondary | ICD-10-CM | POA: Diagnosis not present

## 2022-07-16 DIAGNOSIS — I5042 Chronic combined systolic (congestive) and diastolic (congestive) heart failure: Secondary | ICD-10-CM | POA: Diagnosis not present

## 2022-07-16 DIAGNOSIS — M6281 Muscle weakness (generalized): Secondary | ICD-10-CM | POA: Diagnosis not present

## 2022-07-16 DIAGNOSIS — R2689 Other abnormalities of gait and mobility: Secondary | ICD-10-CM | POA: Diagnosis not present

## 2022-07-17 DIAGNOSIS — I5042 Chronic combined systolic (congestive) and diastolic (congestive) heart failure: Secondary | ICD-10-CM | POA: Diagnosis not present

## 2022-07-17 DIAGNOSIS — M6281 Muscle weakness (generalized): Secondary | ICD-10-CM | POA: Diagnosis not present

## 2022-07-17 DIAGNOSIS — R2689 Other abnormalities of gait and mobility: Secondary | ICD-10-CM | POA: Diagnosis not present

## 2022-07-30 DIAGNOSIS — G251 Drug-induced tremor: Secondary | ICD-10-CM | POA: Diagnosis not present

## 2022-08-03 DIAGNOSIS — N390 Urinary tract infection, site not specified: Secondary | ICD-10-CM | POA: Diagnosis not present

## 2022-08-06 DIAGNOSIS — N390 Urinary tract infection, site not specified: Secondary | ICD-10-CM | POA: Diagnosis not present

## 2022-08-10 DIAGNOSIS — I5042 Chronic combined systolic (congestive) and diastolic (congestive) heart failure: Secondary | ICD-10-CM | POA: Diagnosis not present

## 2022-08-13 DIAGNOSIS — I5040 Unspecified combined systolic (congestive) and diastolic (congestive) heart failure: Secondary | ICD-10-CM | POA: Diagnosis not present

## 2022-08-13 DIAGNOSIS — G251 Drug-induced tremor: Secondary | ICD-10-CM | POA: Diagnosis not present

## 2022-08-14 DIAGNOSIS — I5040 Unspecified combined systolic (congestive) and diastolic (congestive) heart failure: Secondary | ICD-10-CM | POA: Diagnosis not present

## 2022-08-14 DIAGNOSIS — Z5320 Procedure and treatment not carried out because of patient's decision for unspecified reasons: Secondary | ICD-10-CM | POA: Diagnosis not present

## 2022-08-20 DIAGNOSIS — Z5320 Procedure and treatment not carried out because of patient's decision for unspecified reasons: Secondary | ICD-10-CM | POA: Diagnosis not present

## 2022-08-27 DIAGNOSIS — G251 Drug-induced tremor: Secondary | ICD-10-CM | POA: Diagnosis not present

## 2022-08-29 DIAGNOSIS — I5042 Chronic combined systolic (congestive) and diastolic (congestive) heart failure: Secondary | ICD-10-CM | POA: Diagnosis not present

## 2022-08-30 DIAGNOSIS — Z79899 Other long term (current) drug therapy: Secondary | ICD-10-CM | POA: Diagnosis not present

## 2022-08-31 DIAGNOSIS — J449 Chronic obstructive pulmonary disease, unspecified: Secondary | ICD-10-CM | POA: Diagnosis not present

## 2022-08-31 DIAGNOSIS — F32A Depression, unspecified: Secondary | ICD-10-CM | POA: Diagnosis not present

## 2022-09-06 DIAGNOSIS — J449 Chronic obstructive pulmonary disease, unspecified: Secondary | ICD-10-CM | POA: Diagnosis not present

## 2022-09-06 DIAGNOSIS — I5040 Unspecified combined systolic (congestive) and diastolic (congestive) heart failure: Secondary | ICD-10-CM | POA: Diagnosis not present

## 2022-09-06 DIAGNOSIS — F32A Depression, unspecified: Secondary | ICD-10-CM | POA: Diagnosis not present

## 2022-09-10 DIAGNOSIS — G3184 Mild cognitive impairment, so stated: Secondary | ICD-10-CM | POA: Diagnosis not present

## 2022-09-10 DIAGNOSIS — G251 Drug-induced tremor: Secondary | ICD-10-CM | POA: Diagnosis not present

## 2022-09-12 ENCOUNTER — Encounter: Payer: Self-pay | Admitting: Nurse Practitioner

## 2022-09-12 ENCOUNTER — Non-Acute Institutional Stay: Payer: Medicare Other | Admitting: Nurse Practitioner

## 2022-09-12 VITALS — BP 138/58 | HR 75 | Temp 97.1°F | Resp 18 | Wt 112.5 lb

## 2022-09-12 DIAGNOSIS — R5381 Other malaise: Secondary | ICD-10-CM | POA: Diagnosis not present

## 2022-09-12 DIAGNOSIS — I509 Heart failure, unspecified: Secondary | ICD-10-CM | POA: Diagnosis not present

## 2022-09-12 DIAGNOSIS — R0602 Shortness of breath: Secondary | ICD-10-CM | POA: Diagnosis not present

## 2022-09-12 DIAGNOSIS — Z515 Encounter for palliative care: Secondary | ICD-10-CM

## 2022-09-12 DIAGNOSIS — R630 Anorexia: Secondary | ICD-10-CM

## 2022-09-12 NOTE — Progress Notes (Addendum)
Therapist, nutritional Palliative Care Consult Note Telephone: [phone]  Fax: [phone]    Date of encounter: 09/12/22 10:34 AM PATIENT NAME: Erica Mueller [address] Dr Erica [place] [address]   [phone] (home)  DOB: 1951/02/02 MRN: [id number] PRIMARY CARE PROVIDER:    [facility]  RESPONSIBLE PARTY:    Contact Information     Name Relation Home Work Mobile   [place] Son   [phone]   [facility] Brother   [phone]   Erica Mueller [phone]     Erica Mueller, Erica Mueller Granddaughter   [phone]           I met face to face with patient in facility. Palliative Care was asked to follow this patient by consultation request of   [facility] to address advance care planning and complex medical decision making. This is a follow up visit.                                   ASSESSMENT AND PLAN / RECOMMENDATIONS:  Symptom Management/Plan: 1. Advance Care Planning; Full code pending further discussions with family for clarification of medical goals   2. Shortness of breath stable secondary to CHF, currently stable. Continue to monitor respiratory symptoms, continue weights, monitoring edema. Continue outpatient cardiology appointments. Currently not on diuretic   3. Anorexia; relatively stable, reviewed weights, continue to monitor weights, encourage to eat, emotional support; snacks, supplements  02/13/2022 weight 111.3 lbs 08/15/2022 weight 116.5 lbs 09/14/2022 weight 115 lbs  4. Palliative care encounter; Palliative care encounter; Palliative medicine team will continue to support patient, patient's family, and medical team. Visit consisted of counseling and education dealing with the complex and emotionally intense issues of symptom management and palliative care in the setting of serious and potentially life-threatening illness   5. f/u 4 to 8 weeks for ongoing monitoring chronic disease progression, ongoing discussions complex  medical decision making Follow up Palliative Care Visit: Palliative care will continue to follow for complex medical decision making, advance care planning, and clarification of goals. Return 4 weeks or prn.   I spent 45 minutes providing this consultation starting at 10:30 am. More than 50% of the time in this consultation was spent in counseling and care coordination. PPS: 40% Chief Complaint: Follow up palliative consult for complex medical decision making   HISTORY OF PRESENT ILLNESS:  Erica Mueller is a 72 y.o. year old female  with multiple medical problems including CAD, DM, HTN, GERD, syphilis, schizophrenia, depression. Erica Mueller resides at SNF at [facility]. Erica Mueller does require assistance with bathing, dressing prompting; toileting. Erica Mueller per staff no new changes, no falls, wounds, infections, hospitalization. Purpose of today PC f/u visit further discussion monitor trends of appetite, weights, monitor for functional, cognitive decline with chronic disease progression, assess any active symptoms, supportive role. At present Erica Mueller is lying in bed, appears comfortable. Erica Mueller does make eye contact, mumbles words. Erica Mueller appears stable, interactive with visit, cooperative, support provided. No meaningful discussion with cognitive impairment. Attempted to contact son. Medications, goc, poc reviewed. Will continue current poc. Updated staff. PC f/u visit further discussion monitor trends of appetite, weights, monitor for functional, cognitive decline with chronic disease progression, assess any active symptoms, supportive role.   History obtained from review of EMR, discussion with facility staff and Erica Mueller.  I reviewed available labs, medications, imaging, studies and related documents from the EMR.  Records reviewed and summarized above.   Physical Exam: Constitutional: NAD General: frail appearing, pleasant female EYES: lids intact ENMT: oral mucous membranes moist CV: S1S2,  RRR Pulmonary: LCTA, no increased work of breathing, no cough, room air Abdomen: soft and non tender MSK: ambulatory Skin: warm and dry Neuro:  + generalized weakness,  + cognitive impairment Psych: non-anxious affect, Alert  Thank you for the opportunity to participate in the care of Erica Mueller. Please call our office at [phone] if we can be of additional assistance.   Erica Mueller Prince [place], NP

## 2022-09-18 DIAGNOSIS — L0292 Furuncle, unspecified: Secondary | ICD-10-CM | POA: Diagnosis not present

## 2022-09-20 DIAGNOSIS — L0292 Furuncle, unspecified: Secondary | ICD-10-CM | POA: Diagnosis not present

## 2022-09-21 DIAGNOSIS — J449 Chronic obstructive pulmonary disease, unspecified: Secondary | ICD-10-CM | POA: Diagnosis not present

## 2022-09-24 DIAGNOSIS — G251 Drug-induced tremor: Secondary | ICD-10-CM | POA: Diagnosis not present

## 2022-09-24 DIAGNOSIS — G3184 Mild cognitive impairment, so stated: Secondary | ICD-10-CM | POA: Diagnosis not present

## 2022-10-03 DIAGNOSIS — E785 Hyperlipidemia, unspecified: Secondary | ICD-10-CM | POA: Diagnosis not present

## 2022-10-03 DIAGNOSIS — E1165 Type 2 diabetes mellitus with hyperglycemia: Secondary | ICD-10-CM | POA: Diagnosis not present

## 2022-10-03 DIAGNOSIS — L089 Local infection of the skin and subcutaneous tissue, unspecified: Secondary | ICD-10-CM | POA: Diagnosis not present

## 2022-10-03 DIAGNOSIS — I10 Essential (primary) hypertension: Secondary | ICD-10-CM | POA: Diagnosis not present

## 2022-10-03 DIAGNOSIS — L02411 Cutaneous abscess of right axilla: Secondary | ICD-10-CM | POA: Diagnosis not present

## 2022-10-04 DIAGNOSIS — L089 Local infection of the skin and subcutaneous tissue, unspecified: Secondary | ICD-10-CM | POA: Diagnosis not present

## 2022-10-09 ENCOUNTER — Telehealth: Payer: Self-pay | Admitting: Nurse Practitioner

## 2022-10-09 ENCOUNTER — Non-Acute Institutional Stay: Payer: Medicare Other | Admitting: Nurse Practitioner

## 2022-10-09 ENCOUNTER — Encounter: Payer: Self-pay | Admitting: Nurse Practitioner

## 2022-10-09 VITALS — BP 138/58 | HR 75 | Temp 97.7°F | Resp 18 | Wt 114.3 lb

## 2022-10-09 DIAGNOSIS — Z515 Encounter for palliative care: Secondary | ICD-10-CM

## 2022-10-09 DIAGNOSIS — R5381 Other malaise: Secondary | ICD-10-CM

## 2022-10-09 DIAGNOSIS — I509 Heart failure, unspecified: Secondary | ICD-10-CM

## 2022-10-09 DIAGNOSIS — R0602 Shortness of breath: Secondary | ICD-10-CM | POA: Diagnosis not present

## 2022-10-09 DIAGNOSIS — R630 Anorexia: Secondary | ICD-10-CM

## 2022-10-09 NOTE — Telephone Encounter (Signed)
error 

## 2022-10-09 NOTE — Progress Notes (Signed)
Therapist, nutritional Palliative Care Consult Note Telephone: [phone]  Fax: [phone]    Date of encounter: 10/09/22 12:20 PM PATIENT NAME: Erica Mueller [address] [place] [address]   [phone] (home)  DOB: 1950-08-05 MRN: [id number] PRIMARY CARE PROVIDER:    [facility]  RESPONSIBLE PARTY:    Contact Information     Name Relation Home Work Mobile   [place] Son   [phone]   [employer] [facility] Brother   [phone]   Jacinta Shoe [phone]     Ivette, [place] Granddaughter   [phone]      I met face to face with patient in facility. Palliative Care was asked to follow this patient by consultation request of  [facility] to address advance care planning and complex medical decision making. This is a follow up visit.                                   ASSESSMENT AND PLAN / RECOMMENDATIONS:  Symptom Management/Plan: 1. Advance Care Planning; Full code pending further discussions with family for clarification of medical goals   2. Shortness of breath stable secondary to CHF, currently stable. Continue to monitor respiratory symptoms, continue weights, monitoring edema. Continue outpatient cardiology appointments. Currently not on diuretic   3. Anorexia; relatively stable, reviewed weights, continue to monitor weights, encourage to eat, emotional support; snacks, supplements   02/13/2022 weight 111.3 lbs 08/15/2022 weight 116.5 lbs 09/14/2022 weight 115 lbs 10/09/2022 weight 114.3 lbs 4. Palliative care encounter; Palliative care encounter; Palliative medicine team will continue to support patient, patient's family, and medical team. Visit consisted of counseling and education dealing with the complex and emotionally intense issues of symptom management and palliative care in the setting of serious and potentially life-threatening illness   5. f/u 4 to 8 weeks for ongoing monitoring chronic disease progression, ongoing  discussions complex medical decision making Follow up Palliative Care Visit: Palliative care will continue to follow for complex medical decision making, advance care planning, and clarification of goals. Return 4 weeks or prn.   I spent 46 minutes providing this consultation. More than 50% of the time in this consultation was spent in counseling and care coordination. PPS: 40% Chief Complaint: Follow up palliative consult for complex medical decision making   HISTORY OF PRESENT ILLNESS:  Erica Mueller is a 72 y.o. year old female  with multiple medical problems including CAD, DM, HTN, GERD, syphilis, schizophrenia, depression. Erica Mueller resides at SNF at [facility]. Ms. Full does require assistance with bathing, dressing prompting; toileting. Erica Mueller per staff no new changes, no falls, wounds, infections, hospitalization. Purpose of today PC f/u visit further discussion monitor trends of appetite, weights, monitor for functional, cognitive decline with chronic disease progression, assess any active symptoms, supportive role. At present Ms Mueller is lying in bed, appears comfortable, sleeping. Awakes to verbal cues.   Attempted to contact son. Medications, goc, poc reviewed. Will continue current poc. Updated staff. PC f/u visit further discussion monitor trends of appetite, weights, monitor for functional, cognitive decline with chronic disease progression, assess any active symptoms, supportive role.   History obtained from review of EMR, discussion with facility staff and Erica Mueller.  I reviewed available labs, medications, imaging, studies and related documents from the EMR.  Records reviewed and summarized above.    Physical Exam: General: frail appearing, pleasant female ENMT: oral mucous membranes moist CV:  S1S2, RRR Pulmonary: LCTA, no increased work of breathing, no cough, room air Abdomen: soft and non tender MSK: ambulatory Skin: warm and dry Neuro:  + generalized weakness,  + cognitive  impairment Psych: non-anxious affect, Alert  Thank you for the opportunity to participate in the care of Erica Mueller. Please call our office at [phone] if we can be of additional assistance.   Marcelyn Ruppe Prince Rome, NP

## 2022-10-10 DIAGNOSIS — L02411 Cutaneous abscess of right axilla: Secondary | ICD-10-CM | POA: Diagnosis not present

## 2022-10-10 DIAGNOSIS — I10 Essential (primary) hypertension: Secondary | ICD-10-CM | POA: Diagnosis not present

## 2022-10-10 DIAGNOSIS — E1165 Type 2 diabetes mellitus with hyperglycemia: Secondary | ICD-10-CM | POA: Diagnosis not present

## 2022-10-10 DIAGNOSIS — E785 Hyperlipidemia, unspecified: Secondary | ICD-10-CM | POA: Diagnosis not present

## 2022-10-22 DIAGNOSIS — G251 Drug-induced tremor: Secondary | ICD-10-CM | POA: Diagnosis not present

## 2022-10-22 DIAGNOSIS — G3184 Mild cognitive impairment, so stated: Secondary | ICD-10-CM | POA: Diagnosis not present

## 2022-10-29 DIAGNOSIS — G251 Drug-induced tremor: Secondary | ICD-10-CM | POA: Diagnosis not present

## 2022-10-29 DIAGNOSIS — G3184 Mild cognitive impairment, so stated: Secondary | ICD-10-CM | POA: Diagnosis not present

## 2022-11-01 DIAGNOSIS — E119 Type 2 diabetes mellitus without complications: Secondary | ICD-10-CM | POA: Diagnosis not present

## 2022-11-01 DIAGNOSIS — L0292 Furuncle, unspecified: Secondary | ICD-10-CM | POA: Diagnosis not present

## 2022-11-02 DIAGNOSIS — I5042 Chronic combined systolic (congestive) and diastolic (congestive) heart failure: Secondary | ICD-10-CM | POA: Diagnosis not present

## 2022-11-02 DIAGNOSIS — E43 Unspecified severe protein-calorie malnutrition: Secondary | ICD-10-CM | POA: Diagnosis not present

## 2022-11-02 DIAGNOSIS — E1165 Type 2 diabetes mellitus with hyperglycemia: Secondary | ICD-10-CM | POA: Diagnosis not present

## 2022-11-02 DIAGNOSIS — M6281 Muscle weakness (generalized): Secondary | ICD-10-CM | POA: Diagnosis not present

## 2022-11-02 DIAGNOSIS — E119 Type 2 diabetes mellitus without complications: Secondary | ICD-10-CM | POA: Diagnosis not present

## 2022-11-08 DIAGNOSIS — J449 Chronic obstructive pulmonary disease, unspecified: Secondary | ICD-10-CM | POA: Diagnosis not present

## 2022-11-08 DIAGNOSIS — I5040 Unspecified combined systolic (congestive) and diastolic (congestive) heart failure: Secondary | ICD-10-CM | POA: Diagnosis not present

## 2022-11-12 DIAGNOSIS — G3184 Mild cognitive impairment, so stated: Secondary | ICD-10-CM | POA: Diagnosis not present

## 2022-11-12 DIAGNOSIS — G251 Drug-induced tremor: Secondary | ICD-10-CM | POA: Diagnosis not present

## 2022-11-13 DIAGNOSIS — E785 Hyperlipidemia, unspecified: Secondary | ICD-10-CM | POA: Diagnosis not present

## 2022-11-13 DIAGNOSIS — E119 Type 2 diabetes mellitus without complications: Secondary | ICD-10-CM | POA: Diagnosis not present

## 2022-11-13 DIAGNOSIS — F32A Depression, unspecified: Secondary | ICD-10-CM | POA: Diagnosis not present

## 2022-11-13 DIAGNOSIS — Z79899 Other long term (current) drug therapy: Secondary | ICD-10-CM | POA: Diagnosis not present

## 2022-11-13 DIAGNOSIS — I10 Essential (primary) hypertension: Secondary | ICD-10-CM | POA: Diagnosis not present

## 2022-11-27 DIAGNOSIS — F17200 Nicotine dependence, unspecified, uncomplicated: Secondary | ICD-10-CM | POA: Diagnosis not present

## 2022-11-27 DIAGNOSIS — F17219 Nicotine dependence, cigarettes, with unspecified nicotine-induced disorders: Secondary | ICD-10-CM | POA: Diagnosis not present

## 2022-12-05 DIAGNOSIS — G3184 Mild cognitive impairment, so stated: Secondary | ICD-10-CM | POA: Diagnosis not present

## 2022-12-05 DIAGNOSIS — G251 Drug-induced tremor: Secondary | ICD-10-CM | POA: Diagnosis not present

## 2022-12-20 DIAGNOSIS — F17200 Nicotine dependence, unspecified, uncomplicated: Secondary | ICD-10-CM | POA: Diagnosis not present

## 2022-12-31 DIAGNOSIS — E1151 Type 2 diabetes mellitus with diabetic peripheral angiopathy without gangrene: Secondary | ICD-10-CM | POA: Diagnosis not present

## 2022-12-31 DIAGNOSIS — L603 Nail dystrophy: Secondary | ICD-10-CM | POA: Diagnosis not present

## 2023-01-02 DIAGNOSIS — G251 Drug-induced tremor: Secondary | ICD-10-CM | POA: Diagnosis not present

## 2023-01-02 DIAGNOSIS — G3184 Mild cognitive impairment, so stated: Secondary | ICD-10-CM | POA: Diagnosis not present

## 2023-01-08 DIAGNOSIS — M6281 Muscle weakness (generalized): Secondary | ICD-10-CM | POA: Diagnosis not present

## 2023-01-08 DIAGNOSIS — R1312 Dysphagia, oropharyngeal phase: Secondary | ICD-10-CM | POA: Diagnosis not present

## 2023-01-08 DIAGNOSIS — I5042 Chronic combined systolic (congestive) and diastolic (congestive) heart failure: Secondary | ICD-10-CM | POA: Diagnosis not present

## 2023-01-09 DIAGNOSIS — E1169 Type 2 diabetes mellitus with other specified complication: Secondary | ICD-10-CM | POA: Diagnosis not present

## 2023-01-09 DIAGNOSIS — I5042 Chronic combined systolic (congestive) and diastolic (congestive) heart failure: Secondary | ICD-10-CM | POA: Diagnosis not present

## 2023-01-09 DIAGNOSIS — I5032 Chronic diastolic (congestive) heart failure: Secondary | ICD-10-CM | POA: Diagnosis not present

## 2023-01-09 DIAGNOSIS — M6281 Muscle weakness (generalized): Secondary | ICD-10-CM | POA: Diagnosis not present

## 2023-01-09 DIAGNOSIS — I152 Hypertension secondary to endocrine disorders: Secondary | ICD-10-CM | POA: Diagnosis not present

## 2023-01-09 DIAGNOSIS — F17200 Nicotine dependence, unspecified, uncomplicated: Secondary | ICD-10-CM | POA: Diagnosis not present

## 2023-01-09 DIAGNOSIS — E785 Hyperlipidemia, unspecified: Secondary | ICD-10-CM | POA: Diagnosis not present

## 2023-01-09 DIAGNOSIS — E1159 Type 2 diabetes mellitus with other circulatory complications: Secondary | ICD-10-CM | POA: Diagnosis not present

## 2023-01-09 DIAGNOSIS — E1165 Type 2 diabetes mellitus with hyperglycemia: Secondary | ICD-10-CM | POA: Diagnosis not present

## 2023-01-09 DIAGNOSIS — I700 Atherosclerosis of aorta: Secondary | ICD-10-CM | POA: Diagnosis not present

## 2023-01-09 DIAGNOSIS — I351 Nonrheumatic aortic (valve) insufficiency: Secondary | ICD-10-CM | POA: Diagnosis not present

## 2023-01-09 DIAGNOSIS — R1312 Dysphagia, oropharyngeal phase: Secondary | ICD-10-CM | POA: Diagnosis not present

## 2023-01-10 DIAGNOSIS — Z515 Encounter for palliative care: Secondary | ICD-10-CM | POA: Diagnosis not present

## 2023-01-10 DIAGNOSIS — R1312 Dysphagia, oropharyngeal phase: Secondary | ICD-10-CM | POA: Diagnosis not present

## 2023-01-10 DIAGNOSIS — M6281 Muscle weakness (generalized): Secondary | ICD-10-CM | POA: Diagnosis not present

## 2023-01-10 DIAGNOSIS — I5042 Chronic combined systolic (congestive) and diastolic (congestive) heart failure: Secondary | ICD-10-CM | POA: Diagnosis not present

## 2023-01-11 DIAGNOSIS — M6281 Muscle weakness (generalized): Secondary | ICD-10-CM | POA: Diagnosis not present

## 2023-01-11 DIAGNOSIS — R1312 Dysphagia, oropharyngeal phase: Secondary | ICD-10-CM | POA: Diagnosis not present

## 2023-01-11 DIAGNOSIS — I5042 Chronic combined systolic (congestive) and diastolic (congestive) heart failure: Secondary | ICD-10-CM | POA: Diagnosis not present

## 2023-01-13 DIAGNOSIS — M6281 Muscle weakness (generalized): Secondary | ICD-10-CM | POA: Diagnosis not present

## 2023-01-13 DIAGNOSIS — R1312 Dysphagia, oropharyngeal phase: Secondary | ICD-10-CM | POA: Diagnosis not present

## 2023-01-13 DIAGNOSIS — I5042 Chronic combined systolic (congestive) and diastolic (congestive) heart failure: Secondary | ICD-10-CM | POA: Diagnosis not present

## 2023-01-14 DIAGNOSIS — M6281 Muscle weakness (generalized): Secondary | ICD-10-CM | POA: Diagnosis not present

## 2023-01-14 DIAGNOSIS — R1312 Dysphagia, oropharyngeal phase: Secondary | ICD-10-CM | POA: Diagnosis not present

## 2023-01-14 DIAGNOSIS — I5042 Chronic combined systolic (congestive) and diastolic (congestive) heart failure: Secondary | ICD-10-CM | POA: Diagnosis not present

## 2023-01-15 DIAGNOSIS — R1312 Dysphagia, oropharyngeal phase: Secondary | ICD-10-CM | POA: Diagnosis not present

## 2023-01-15 DIAGNOSIS — I5042 Chronic combined systolic (congestive) and diastolic (congestive) heart failure: Secondary | ICD-10-CM | POA: Diagnosis not present

## 2023-01-15 DIAGNOSIS — M6281 Muscle weakness (generalized): Secondary | ICD-10-CM | POA: Diagnosis not present

## 2023-01-16 DIAGNOSIS — I5042 Chronic combined systolic (congestive) and diastolic (congestive) heart failure: Secondary | ICD-10-CM | POA: Diagnosis not present

## 2023-01-16 DIAGNOSIS — R1312 Dysphagia, oropharyngeal phase: Secondary | ICD-10-CM | POA: Diagnosis not present

## 2023-01-16 DIAGNOSIS — M6281 Muscle weakness (generalized): Secondary | ICD-10-CM | POA: Diagnosis not present

## 2023-01-17 DIAGNOSIS — I5042 Chronic combined systolic (congestive) and diastolic (congestive) heart failure: Secondary | ICD-10-CM | POA: Diagnosis not present

## 2023-01-17 DIAGNOSIS — M6281 Muscle weakness (generalized): Secondary | ICD-10-CM | POA: Diagnosis not present

## 2023-01-17 DIAGNOSIS — R1312 Dysphagia, oropharyngeal phase: Secondary | ICD-10-CM | POA: Diagnosis not present

## 2023-01-18 DIAGNOSIS — M6281 Muscle weakness (generalized): Secondary | ICD-10-CM | POA: Diagnosis not present

## 2023-01-18 DIAGNOSIS — R1312 Dysphagia, oropharyngeal phase: Secondary | ICD-10-CM | POA: Diagnosis not present

## 2023-01-18 DIAGNOSIS — I5042 Chronic combined systolic (congestive) and diastolic (congestive) heart failure: Secondary | ICD-10-CM | POA: Diagnosis not present

## 2023-01-21 DIAGNOSIS — R1312 Dysphagia, oropharyngeal phase: Secondary | ICD-10-CM | POA: Diagnosis not present

## 2023-01-21 DIAGNOSIS — I5042 Chronic combined systolic (congestive) and diastolic (congestive) heart failure: Secondary | ICD-10-CM | POA: Diagnosis not present

## 2023-01-21 DIAGNOSIS — M6281 Muscle weakness (generalized): Secondary | ICD-10-CM | POA: Diagnosis not present

## 2023-01-22 DIAGNOSIS — M6281 Muscle weakness (generalized): Secondary | ICD-10-CM | POA: Diagnosis not present

## 2023-01-22 DIAGNOSIS — I5042 Chronic combined systolic (congestive) and diastolic (congestive) heart failure: Secondary | ICD-10-CM | POA: Diagnosis not present

## 2023-01-22 DIAGNOSIS — R1312 Dysphagia, oropharyngeal phase: Secondary | ICD-10-CM | POA: Diagnosis not present

## 2023-01-23 DIAGNOSIS — E43 Unspecified severe protein-calorie malnutrition: Secondary | ICD-10-CM | POA: Diagnosis not present

## 2023-01-23 DIAGNOSIS — R1312 Dysphagia, oropharyngeal phase: Secondary | ICD-10-CM | POA: Diagnosis not present

## 2023-01-23 DIAGNOSIS — I5042 Chronic combined systolic (congestive) and diastolic (congestive) heart failure: Secondary | ICD-10-CM | POA: Diagnosis not present

## 2023-01-23 DIAGNOSIS — M6281 Muscle weakness (generalized): Secondary | ICD-10-CM | POA: Diagnosis not present

## 2023-01-23 DIAGNOSIS — E1165 Type 2 diabetes mellitus with hyperglycemia: Secondary | ICD-10-CM | POA: Diagnosis not present

## 2023-01-24 DIAGNOSIS — M6281 Muscle weakness (generalized): Secondary | ICD-10-CM | POA: Diagnosis not present

## 2023-01-24 DIAGNOSIS — R1312 Dysphagia, oropharyngeal phase: Secondary | ICD-10-CM | POA: Diagnosis not present

## 2023-01-24 DIAGNOSIS — I5042 Chronic combined systolic (congestive) and diastolic (congestive) heart failure: Secondary | ICD-10-CM | POA: Diagnosis not present

## 2023-01-27 DIAGNOSIS — E119 Type 2 diabetes mellitus without complications: Secondary | ICD-10-CM | POA: Diagnosis not present

## 2023-01-27 DIAGNOSIS — I10 Essential (primary) hypertension: Secondary | ICD-10-CM | POA: Diagnosis not present

## 2023-01-27 DIAGNOSIS — E785 Hyperlipidemia, unspecified: Secondary | ICD-10-CM | POA: Diagnosis not present

## 2023-01-27 DIAGNOSIS — R1310 Dysphagia, unspecified: Secondary | ICD-10-CM | POA: Diagnosis not present

## 2023-01-27 DIAGNOSIS — Z5320 Procedure and treatment not carried out because of patient's decision for unspecified reasons: Secondary | ICD-10-CM | POA: Diagnosis not present

## 2023-01-30 DIAGNOSIS — M6281 Muscle weakness (generalized): Secondary | ICD-10-CM | POA: Diagnosis not present

## 2023-01-30 DIAGNOSIS — R1312 Dysphagia, oropharyngeal phase: Secondary | ICD-10-CM | POA: Diagnosis not present

## 2023-01-30 DIAGNOSIS — I5042 Chronic combined systolic (congestive) and diastolic (congestive) heart failure: Secondary | ICD-10-CM | POA: Diagnosis not present

## 2023-02-01 DIAGNOSIS — R1312 Dysphagia, oropharyngeal phase: Secondary | ICD-10-CM | POA: Diagnosis not present

## 2023-02-01 DIAGNOSIS — I5042 Chronic combined systolic (congestive) and diastolic (congestive) heart failure: Secondary | ICD-10-CM | POA: Diagnosis not present

## 2023-02-01 DIAGNOSIS — M6281 Muscle weakness (generalized): Secondary | ICD-10-CM | POA: Diagnosis not present

## 2023-02-03 DIAGNOSIS — M6281 Muscle weakness (generalized): Secondary | ICD-10-CM | POA: Diagnosis not present

## 2023-02-03 DIAGNOSIS — I5042 Chronic combined systolic (congestive) and diastolic (congestive) heart failure: Secondary | ICD-10-CM | POA: Diagnosis not present

## 2023-02-03 DIAGNOSIS — R1312 Dysphagia, oropharyngeal phase: Secondary | ICD-10-CM | POA: Diagnosis not present

## 2023-02-04 DIAGNOSIS — G251 Drug-induced tremor: Secondary | ICD-10-CM | POA: Diagnosis not present

## 2023-02-04 DIAGNOSIS — G3184 Mild cognitive impairment, so stated: Secondary | ICD-10-CM | POA: Diagnosis not present

## 2023-02-05 DIAGNOSIS — R1312 Dysphagia, oropharyngeal phase: Secondary | ICD-10-CM | POA: Diagnosis not present

## 2023-02-05 DIAGNOSIS — M6281 Muscle weakness (generalized): Secondary | ICD-10-CM | POA: Diagnosis not present

## 2023-02-05 DIAGNOSIS — I5042 Chronic combined systolic (congestive) and diastolic (congestive) heart failure: Secondary | ICD-10-CM | POA: Diagnosis not present

## 2023-02-06 DIAGNOSIS — R1312 Dysphagia, oropharyngeal phase: Secondary | ICD-10-CM | POA: Diagnosis not present

## 2023-02-06 DIAGNOSIS — I5042 Chronic combined systolic (congestive) and diastolic (congestive) heart failure: Secondary | ICD-10-CM | POA: Diagnosis not present

## 2023-02-06 DIAGNOSIS — M6281 Muscle weakness (generalized): Secondary | ICD-10-CM | POA: Diagnosis not present

## 2023-02-07 DIAGNOSIS — D649 Anemia, unspecified: Secondary | ICD-10-CM | POA: Diagnosis not present

## 2023-02-07 DIAGNOSIS — E119 Type 2 diabetes mellitus without complications: Secondary | ICD-10-CM | POA: Diagnosis not present

## 2023-02-08 DIAGNOSIS — D649 Anemia, unspecified: Secondary | ICD-10-CM | POA: Diagnosis not present

## 2023-02-12 DIAGNOSIS — H524 Presbyopia: Secondary | ICD-10-CM | POA: Diagnosis not present

## 2023-02-12 DIAGNOSIS — E119 Type 2 diabetes mellitus without complications: Secondary | ICD-10-CM | POA: Diagnosis not present

## 2023-02-12 DIAGNOSIS — H25813 Combined forms of age-related cataract, bilateral: Secondary | ICD-10-CM | POA: Diagnosis not present

## 2023-02-14 DIAGNOSIS — N390 Urinary tract infection, site not specified: Secondary | ICD-10-CM | POA: Diagnosis not present

## 2023-02-14 DIAGNOSIS — R300 Dysuria: Secondary | ICD-10-CM | POA: Diagnosis not present

## 2023-02-15 DIAGNOSIS — N390 Urinary tract infection, site not specified: Secondary | ICD-10-CM | POA: Diagnosis not present

## 2023-02-20 DIAGNOSIS — N390 Urinary tract infection, site not specified: Secondary | ICD-10-CM | POA: Diagnosis not present

## 2023-02-22 DIAGNOSIS — I10 Essential (primary) hypertension: Secondary | ICD-10-CM | POA: Diagnosis not present

## 2023-02-22 DIAGNOSIS — E119 Type 2 diabetes mellitus without complications: Secondary | ICD-10-CM | POA: Diagnosis not present

## 2023-02-22 DIAGNOSIS — I5042 Chronic combined systolic (congestive) and diastolic (congestive) heart failure: Secondary | ICD-10-CM | POA: Diagnosis not present

## 2023-02-22 DIAGNOSIS — R1312 Dysphagia, oropharyngeal phase: Secondary | ICD-10-CM | POA: Diagnosis not present

## 2023-02-22 DIAGNOSIS — J439 Emphysema, unspecified: Secondary | ICD-10-CM | POA: Diagnosis not present

## 2023-02-26 DIAGNOSIS — Z0189 Encounter for other specified special examinations: Secondary | ICD-10-CM | POA: Diagnosis not present

## 2023-02-26 DIAGNOSIS — I10 Essential (primary) hypertension: Secondary | ICD-10-CM | POA: Diagnosis not present

## 2023-02-26 DIAGNOSIS — E119 Type 2 diabetes mellitus without complications: Secondary | ICD-10-CM | POA: Diagnosis not present

## 2023-02-27 DIAGNOSIS — Z760 Encounter for issue of repeat prescription: Secondary | ICD-10-CM | POA: Diagnosis not present

## 2023-02-28 DIAGNOSIS — Z5320 Procedure and treatment not carried out because of patient's decision for unspecified reasons: Secondary | ICD-10-CM | POA: Diagnosis not present

## 2023-03-04 DIAGNOSIS — G251 Drug-induced tremor: Secondary | ICD-10-CM | POA: Diagnosis not present

## 2023-03-04 DIAGNOSIS — G3184 Mild cognitive impairment, so stated: Secondary | ICD-10-CM | POA: Diagnosis not present

## 2023-03-05 DIAGNOSIS — D649 Anemia, unspecified: Secondary | ICD-10-CM | POA: Diagnosis not present

## 2023-03-14 DIAGNOSIS — E119 Type 2 diabetes mellitus without complications: Secondary | ICD-10-CM | POA: Diagnosis not present

## 2023-03-14 DIAGNOSIS — I10 Essential (primary) hypertension: Secondary | ICD-10-CM | POA: Diagnosis not present

## 2023-03-14 DIAGNOSIS — Z0189 Encounter for other specified special examinations: Secondary | ICD-10-CM | POA: Diagnosis not present

## 2023-03-19 DIAGNOSIS — R1312 Dysphagia, oropharyngeal phase: Secondary | ICD-10-CM | POA: Diagnosis not present

## 2023-03-19 DIAGNOSIS — J439 Emphysema, unspecified: Secondary | ICD-10-CM | POA: Diagnosis not present

## 2023-03-19 DIAGNOSIS — I5042 Chronic combined systolic (congestive) and diastolic (congestive) heart failure: Secondary | ICD-10-CM | POA: Diagnosis not present

## 2023-03-20 DIAGNOSIS — M6281 Muscle weakness (generalized): Secondary | ICD-10-CM | POA: Diagnosis not present

## 2023-03-20 DIAGNOSIS — I5042 Chronic combined systolic (congestive) and diastolic (congestive) heart failure: Secondary | ICD-10-CM | POA: Diagnosis not present

## 2023-04-01 DIAGNOSIS — G251 Drug-induced tremor: Secondary | ICD-10-CM | POA: Diagnosis not present

## 2023-04-01 DIAGNOSIS — G3184 Mild cognitive impairment, so stated: Secondary | ICD-10-CM | POA: Diagnosis not present

## 2023-04-15 DIAGNOSIS — J439 Emphysema, unspecified: Secondary | ICD-10-CM | POA: Diagnosis not present

## 2023-04-15 DIAGNOSIS — I5042 Chronic combined systolic (congestive) and diastolic (congestive) heart failure: Secondary | ICD-10-CM | POA: Diagnosis not present

## 2023-04-15 DIAGNOSIS — R1312 Dysphagia, oropharyngeal phase: Secondary | ICD-10-CM | POA: Diagnosis not present

## 2023-04-17 DIAGNOSIS — E119 Type 2 diabetes mellitus without complications: Secondary | ICD-10-CM | POA: Diagnosis not present

## 2023-04-17 DIAGNOSIS — I5042 Chronic combined systolic (congestive) and diastolic (congestive) heart failure: Secondary | ICD-10-CM | POA: Diagnosis not present

## 2023-04-17 DIAGNOSIS — I10 Essential (primary) hypertension: Secondary | ICD-10-CM | POA: Diagnosis not present

## 2023-04-17 DIAGNOSIS — D759 Disease of blood and blood-forming organs, unspecified: Secondary | ICD-10-CM | POA: Diagnosis not present

## 2023-05-06 DIAGNOSIS — E119 Type 2 diabetes mellitus without complications: Secondary | ICD-10-CM | POA: Diagnosis not present

## 2023-05-06 DIAGNOSIS — I10 Essential (primary) hypertension: Secondary | ICD-10-CM | POA: Diagnosis not present

## 2023-05-13 DIAGNOSIS — J439 Emphysema, unspecified: Secondary | ICD-10-CM | POA: Diagnosis not present

## 2023-05-13 DIAGNOSIS — I5042 Chronic combined systolic (congestive) and diastolic (congestive) heart failure: Secondary | ICD-10-CM | POA: Diagnosis not present

## 2023-05-13 DIAGNOSIS — R1312 Dysphagia, oropharyngeal phase: Secondary | ICD-10-CM | POA: Diagnosis not present

## 2023-05-17 DIAGNOSIS — E785 Hyperlipidemia, unspecified: Secondary | ICD-10-CM | POA: Diagnosis not present

## 2023-05-17 DIAGNOSIS — I10 Essential (primary) hypertension: Secondary | ICD-10-CM | POA: Diagnosis not present

## 2023-05-17 DIAGNOSIS — L02411 Cutaneous abscess of right axilla: Secondary | ICD-10-CM | POA: Diagnosis not present

## 2023-05-17 DIAGNOSIS — E1165 Type 2 diabetes mellitus with hyperglycemia: Secondary | ICD-10-CM | POA: Diagnosis not present

## 2023-05-30 DIAGNOSIS — E119 Type 2 diabetes mellitus without complications: Secondary | ICD-10-CM | POA: Diagnosis not present

## 2023-06-10 DIAGNOSIS — L84 Corns and callosities: Secondary | ICD-10-CM | POA: Diagnosis not present

## 2023-06-10 DIAGNOSIS — E1151 Type 2 diabetes mellitus with diabetic peripheral angiopathy without gangrene: Secondary | ICD-10-CM | POA: Diagnosis not present

## 2023-06-10 DIAGNOSIS — L602 Onychogryphosis: Secondary | ICD-10-CM | POA: Diagnosis not present

## 2023-06-10 DIAGNOSIS — Z7984 Long term (current) use of oral hypoglycemic drugs: Secondary | ICD-10-CM | POA: Diagnosis not present

## 2023-06-10 DIAGNOSIS — L603 Nail dystrophy: Secondary | ICD-10-CM | POA: Diagnosis not present

## 2023-06-21 DIAGNOSIS — R1312 Dysphagia, oropharyngeal phase: Secondary | ICD-10-CM | POA: Diagnosis not present

## 2023-06-21 DIAGNOSIS — J439 Emphysema, unspecified: Secondary | ICD-10-CM | POA: Diagnosis not present

## 2023-06-21 DIAGNOSIS — I5042 Chronic combined systolic (congestive) and diastolic (congestive) heart failure: Secondary | ICD-10-CM | POA: Diagnosis not present

## 2023-07-10 DIAGNOSIS — I509 Heart failure, unspecified: Secondary | ICD-10-CM | POA: Diagnosis not present

## 2023-07-10 DIAGNOSIS — D649 Anemia, unspecified: Secondary | ICD-10-CM | POA: Diagnosis not present

## 2023-07-10 DIAGNOSIS — R531 Weakness: Secondary | ICD-10-CM | POA: Diagnosis not present

## 2023-07-10 DIAGNOSIS — E119 Type 2 diabetes mellitus without complications: Secondary | ICD-10-CM | POA: Diagnosis not present

## 2023-07-11 DIAGNOSIS — I152 Hypertension secondary to endocrine disorders: Secondary | ICD-10-CM | POA: Diagnosis not present

## 2023-07-11 DIAGNOSIS — M79645 Pain in left finger(s): Secondary | ICD-10-CM | POA: Diagnosis not present

## 2023-07-11 DIAGNOSIS — E782 Mixed hyperlipidemia: Secondary | ICD-10-CM | POA: Diagnosis not present

## 2023-07-11 DIAGNOSIS — I5032 Chronic diastolic (congestive) heart failure: Secondary | ICD-10-CM | POA: Diagnosis not present

## 2023-07-11 DIAGNOSIS — F17200 Nicotine dependence, unspecified, uncomplicated: Secondary | ICD-10-CM | POA: Diagnosis not present

## 2023-07-11 DIAGNOSIS — I351 Nonrheumatic aortic (valve) insufficiency: Secondary | ICD-10-CM | POA: Diagnosis not present

## 2023-07-11 DIAGNOSIS — E1159 Type 2 diabetes mellitus with other circulatory complications: Secondary | ICD-10-CM | POA: Diagnosis not present

## 2023-07-12 DIAGNOSIS — M79645 Pain in left finger(s): Secondary | ICD-10-CM | POA: Diagnosis not present

## 2023-07-16 DIAGNOSIS — I5042 Chronic combined systolic (congestive) and diastolic (congestive) heart failure: Secondary | ICD-10-CM | POA: Diagnosis not present

## 2023-07-16 DIAGNOSIS — R531 Weakness: Secondary | ICD-10-CM | POA: Diagnosis not present

## 2023-07-20 DIAGNOSIS — M79645 Pain in left finger(s): Secondary | ICD-10-CM | POA: Diagnosis not present

## 2023-07-22 DIAGNOSIS — M79645 Pain in left finger(s): Secondary | ICD-10-CM | POA: Diagnosis not present

## 2023-07-22 DIAGNOSIS — R531 Weakness: Secondary | ICD-10-CM | POA: Diagnosis not present

## 2023-07-24 DIAGNOSIS — I5042 Chronic combined systolic (congestive) and diastolic (congestive) heart failure: Secondary | ICD-10-CM | POA: Diagnosis not present

## 2023-07-24 DIAGNOSIS — J439 Emphysema, unspecified: Secondary | ICD-10-CM | POA: Diagnosis not present

## 2023-07-24 DIAGNOSIS — R1312 Dysphagia, oropharyngeal phase: Secondary | ICD-10-CM | POA: Diagnosis not present

## 2023-08-20 DIAGNOSIS — E118 Type 2 diabetes mellitus with unspecified complications: Secondary | ICD-10-CM | POA: Diagnosis not present

## 2023-08-20 DIAGNOSIS — E785 Hyperlipidemia, unspecified: Secondary | ICD-10-CM | POA: Diagnosis not present

## 2023-09-03 DIAGNOSIS — E118 Type 2 diabetes mellitus with unspecified complications: Secondary | ICD-10-CM | POA: Diagnosis not present

## 2023-09-03 DIAGNOSIS — I110 Hypertensive heart disease with heart failure: Secondary | ICD-10-CM | POA: Diagnosis not present

## 2023-09-03 DIAGNOSIS — Z7984 Long term (current) use of oral hypoglycemic drugs: Secondary | ICD-10-CM | POA: Diagnosis not present

## 2023-09-03 DIAGNOSIS — I5022 Chronic systolic (congestive) heart failure: Secondary | ICD-10-CM | POA: Diagnosis not present

## 2023-09-03 DIAGNOSIS — E119 Type 2 diabetes mellitus without complications: Secondary | ICD-10-CM | POA: Diagnosis not present

## 2023-09-03 DIAGNOSIS — K5900 Constipation, unspecified: Secondary | ICD-10-CM | POA: Diagnosis not present

## 2023-09-03 DIAGNOSIS — E785 Hyperlipidemia, unspecified: Secondary | ICD-10-CM | POA: Diagnosis not present

## 2023-09-09 DIAGNOSIS — G4700 Insomnia, unspecified: Secondary | ICD-10-CM | POA: Diagnosis not present

## 2023-09-09 DIAGNOSIS — R634 Abnormal weight loss: Secondary | ICD-10-CM | POA: Diagnosis not present

## 2023-09-19 DIAGNOSIS — L0292 Furuncle, unspecified: Secondary | ICD-10-CM | POA: Diagnosis not present

## 2023-09-19 DIAGNOSIS — E118 Type 2 diabetes mellitus with unspecified complications: Secondary | ICD-10-CM | POA: Diagnosis not present

## 2023-09-26 DIAGNOSIS — E118 Type 2 diabetes mellitus with unspecified complications: Secondary | ICD-10-CM | POA: Diagnosis not present

## 2023-09-26 DIAGNOSIS — L0292 Furuncle, unspecified: Secondary | ICD-10-CM | POA: Diagnosis not present

## 2023-10-08 DIAGNOSIS — E559 Vitamin D deficiency, unspecified: Secondary | ICD-10-CM | POA: Diagnosis not present

## 2023-10-08 DIAGNOSIS — L0292 Furuncle, unspecified: Secondary | ICD-10-CM | POA: Diagnosis not present

## 2023-10-08 DIAGNOSIS — I110 Hypertensive heart disease with heart failure: Secondary | ICD-10-CM | POA: Diagnosis not present

## 2023-10-08 DIAGNOSIS — E785 Hyperlipidemia, unspecified: Secondary | ICD-10-CM | POA: Diagnosis not present

## 2023-10-08 DIAGNOSIS — E118 Type 2 diabetes mellitus with unspecified complications: Secondary | ICD-10-CM | POA: Diagnosis not present

## 2023-10-08 DIAGNOSIS — K5900 Constipation, unspecified: Secondary | ICD-10-CM | POA: Diagnosis not present

## 2023-10-08 DIAGNOSIS — R051 Acute cough: Secondary | ICD-10-CM | POA: Diagnosis not present

## 2023-10-09 DIAGNOSIS — F17200 Nicotine dependence, unspecified, uncomplicated: Secondary | ICD-10-CM | POA: Diagnosis not present

## 2023-10-09 DIAGNOSIS — R451 Restlessness and agitation: Secondary | ICD-10-CM | POA: Diagnosis not present

## 2023-10-24 DIAGNOSIS — L0292 Furuncle, unspecified: Secondary | ICD-10-CM | POA: Diagnosis not present

## 2023-10-24 DIAGNOSIS — E785 Hyperlipidemia, unspecified: Secondary | ICD-10-CM | POA: Diagnosis not present

## 2023-10-24 DIAGNOSIS — E118 Type 2 diabetes mellitus with unspecified complications: Secondary | ICD-10-CM | POA: Diagnosis not present

## 2023-10-24 DIAGNOSIS — N182 Chronic kidney disease, stage 2 (mild): Secondary | ICD-10-CM | POA: Diagnosis not present

## 2023-10-24 DIAGNOSIS — I110 Hypertensive heart disease with heart failure: Secondary | ICD-10-CM | POA: Diagnosis not present

## 2023-10-24 DIAGNOSIS — K5900 Constipation, unspecified: Secondary | ICD-10-CM | POA: Diagnosis not present

## 2023-10-24 DIAGNOSIS — E559 Vitamin D deficiency, unspecified: Secondary | ICD-10-CM | POA: Diagnosis not present

## 2023-11-01 DIAGNOSIS — N182 Chronic kidney disease, stage 2 (mild): Secondary | ICD-10-CM | POA: Diagnosis not present

## 2023-11-01 DIAGNOSIS — E118 Type 2 diabetes mellitus with unspecified complications: Secondary | ICD-10-CM | POA: Diagnosis not present

## 2023-11-08 DIAGNOSIS — Z79899 Other long term (current) drug therapy: Secondary | ICD-10-CM | POA: Diagnosis not present

## 2023-11-08 DIAGNOSIS — R451 Restlessness and agitation: Secondary | ICD-10-CM | POA: Diagnosis not present

## 2023-11-08 DIAGNOSIS — F17200 Nicotine dependence, unspecified, uncomplicated: Secondary | ICD-10-CM | POA: Diagnosis not present

## 2023-11-14 DIAGNOSIS — I5042 Chronic combined systolic (congestive) and diastolic (congestive) heart failure: Secondary | ICD-10-CM | POA: Diagnosis not present

## 2023-11-14 DIAGNOSIS — N182 Chronic kidney disease, stage 2 (mild): Secondary | ICD-10-CM | POA: Diagnosis not present

## 2023-12-02 DIAGNOSIS — Z79899 Other long term (current) drug therapy: Secondary | ICD-10-CM | POA: Diagnosis not present

## 2023-12-02 DIAGNOSIS — R451 Restlessness and agitation: Secondary | ICD-10-CM | POA: Diagnosis not present

## 2023-12-02 DIAGNOSIS — F17200 Nicotine dependence, unspecified, uncomplicated: Secondary | ICD-10-CM | POA: Diagnosis not present

## 2023-12-09 DIAGNOSIS — E785 Hyperlipidemia, unspecified: Secondary | ICD-10-CM | POA: Diagnosis not present

## 2023-12-09 DIAGNOSIS — I110 Hypertensive heart disease with heart failure: Secondary | ICD-10-CM | POA: Diagnosis not present

## 2023-12-09 DIAGNOSIS — N182 Chronic kidney disease, stage 2 (mild): Secondary | ICD-10-CM | POA: Diagnosis not present

## 2023-12-09 DIAGNOSIS — E559 Vitamin D deficiency, unspecified: Secondary | ICD-10-CM | POA: Diagnosis not present

## 2023-12-09 DIAGNOSIS — E118 Type 2 diabetes mellitus with unspecified complications: Secondary | ICD-10-CM | POA: Diagnosis not present

## 2023-12-09 DIAGNOSIS — K5900 Constipation, unspecified: Secondary | ICD-10-CM | POA: Diagnosis not present

## 2024-03-10 ENCOUNTER — Other Ambulatory Visit: Payer: Self-pay

## 2024-03-10 ENCOUNTER — Emergency Department

## 2024-03-10 ENCOUNTER — Emergency Department
Admission: EM | Admit: 2024-03-10 | Discharge: 2024-03-11 | Disposition: A | Discharge status: Home / Self Care | Attending: Emergency Medicine | Admitting: Emergency Medicine

## 2024-03-10 DIAGNOSIS — R4182 Altered mental status, unspecified: Secondary | ICD-10-CM | POA: Insufficient documentation

## 2024-03-10 DIAGNOSIS — R531 Weakness: Secondary | ICD-10-CM | POA: Diagnosis not present

## 2024-03-10 DIAGNOSIS — E119 Type 2 diabetes mellitus without complications: Secondary | ICD-10-CM | POA: Insufficient documentation

## 2024-03-10 DIAGNOSIS — I2510 Atherosclerotic heart disease of native coronary artery without angina pectoris: Secondary | ICD-10-CM | POA: Diagnosis not present

## 2024-03-10 DIAGNOSIS — I5020 Unspecified systolic (congestive) heart failure: Secondary | ICD-10-CM | POA: Diagnosis not present

## 2024-03-10 DIAGNOSIS — N3090 Cystitis, unspecified without hematuria: Secondary | ICD-10-CM

## 2024-03-10 DIAGNOSIS — I110 Hypertensive heart disease with heart failure: Secondary | ICD-10-CM | POA: Diagnosis not present

## 2024-03-10 LAB — COMPREHENSIVE METABOLIC PANEL WITH GFR
ALT: 22 U/L (ref 0–44)
AST: 34 U/L (ref 15–41)
Albumin: 3.5 g/dL (ref 3.5–5.0)
Alkaline Phosphatase: 41 U/L (ref 38–126)
Anion gap: 7 (ref 5–15)
BUN: 17 mg/dL (ref 8–23)
CO2: 26 mmol/L (ref 22–32)
Calcium: 9 mg/dL (ref 8.9–10.3)
Chloride: 107 mmol/L (ref 98–111)
Creatinine, Ser: 1.06 mg/dL — ABNORMAL HIGH (ref 0.44–1.00)
GFR, Estimated: 55 mL/min — ABNORMAL LOW (ref >60)
Glucose, Bld: 100 mg/dL — ABNORMAL HIGH (ref 70–99)
Potassium: 4 mmol/L (ref 3.5–5.1)
Sodium: 140 mmol/L (ref 135–145)
Total Bilirubin: 0.8 mg/dL (ref 0.0–1.2)
Total Protein: 7.4 g/dL (ref 6.5–8.1)

## 2024-03-10 LAB — CBC WITH DIFFERENTIAL/PLATELET
Abs Immature Granulocytes: 0.01 K/uL (ref 0.00–0.07)
Basophils Absolute: 0 K/uL (ref 0.0–0.1)
Basophils Relative: 0 %
Eosinophils Absolute: 0.1 K/uL (ref 0.0–0.5)
Eosinophils Relative: 1 %
HCT: 38.4 % (ref 36.0–46.0)
Hemoglobin: 11.6 g/dL — ABNORMAL LOW (ref 12.0–15.0)
Immature Granulocytes: 0 %
Lymphocytes Relative: 28 %
Lymphs Abs: 1.7 K/uL (ref 0.7–4.0)
MCH: 29.5 pg (ref 26.0–34.0)
MCHC: 30.2 g/dL (ref 30.0–36.0)
MCV: 97.7 fL (ref 80.0–100.0)
Monocytes Absolute: 0.6 K/uL (ref 0.1–1.0)
Monocytes Relative: 9 %
Neutro Abs: 3.6 K/uL (ref 1.7–7.7)
Neutrophils Relative %: 62 %
Platelets: 164 K/uL (ref 150–400)
RBC: 3.93 MIL/uL (ref 3.87–5.11)
RDW: 13 % (ref 11.5–15.5)
Smear Review: NORMAL
WBC: 5.9 K/uL (ref 4.0–10.5)
nRBC: 0 % (ref 0.0–0.2)

## 2024-03-10 NOTE — ED Provider Notes (Signed)
 [facility] Provider Note    Event Date/Time   First MD Initiated Contact with Patient 03/10/24 1918     (approximate)   History   Chief Complaint Weakness   HPI  Erica Mueller is a 73 y.o. female with past medical history of hypertension, diabetes, CAD, HFrEF, anemia, GERD, and schizophrenia who presents to the ED complaining of weakness.  Per EMS, patient's staff at her group home became concerned today that she has been weaker than usual.  She reportedly has had a hard time feeding herself and otherwise caring for herself.  She is at her reported baseline mental status and currently denies any complaints.  She is not sure why she is here.     Physical Exam   Triage Vital Signs: ED Triage Vitals  Encounter Vitals Group     BP 03/10/24 1847 133/61     Girls Systolic BP Percentile --      Girls Diastolic BP Percentile --      Boys Systolic BP Percentile --      Boys Diastolic BP Percentile --      Pulse Rate 03/10/24 1847 80     Resp 03/10/24 1847 18     Temp 03/10/24 1847 98.2 F (36.8 C)     Temp src --      SpO2 03/10/24 1847 99 %     Weight 03/10/24 1848 135 lb (61.2 kg)     Height 03/10/24 1848 5' 2 (1.575 m)     Head Circumference --      Peak Flow --      Pain Score --      Pain Loc --      Pain Education --      Exclude from Growth Chart --     Most recent vital signs: Vitals:   03/10/24 1847 03/10/24 2230  BP: 133/61 (!) 142/47  Pulse: 80 62  Resp: 18 18  Temp: 98.2 F (36.8 C) 98.1 F (36.7 C)  SpO2: 99% 99%    Constitutional: Alert and oriented to person, but not place, time, or situation. Eyes: Conjunctivae are normal. Head: Atraumatic. Nose: No congestion/rhinnorhea. Mouth/Throat: Mucous membranes are moist.  Cardiovascular: Normal rate, regular rhythm. Grossly normal heart sounds.  2+ radial pulses bilaterally. Respiratory: Normal respiratory effort.  No retractions. Lungs CTAB. Gastrointestinal: Soft and  nontender. No distention. Musculoskeletal: No lower extremity tenderness nor edema.  Neurologic:  Normal speech and language. No gross focal neurologic deficits are appreciated.    ED Results / Procedures / Treatments   Labs (all labs ordered are listed, but only abnormal results are displayed) Labs Reviewed  CBC WITH DIFFERENTIAL/PLATELET - Abnormal; Notable for the following components:      Result Value   Hemoglobin 11.6 (*)    All other components within normal limits  COMPREHENSIVE METABOLIC PANEL WITH GFR - Abnormal; Notable for the following components:   Glucose, Bld 100 (*)    Creatinine, Ser 1.06 (*)    GFR, Estimated 55 (*)    All other components within normal limits  URINALYSIS, ROUTINE W REFLEX MICROSCOPIC     EKG  ED ECG REPORT I, Carlin Palin, the attending physician, personally viewed and interpreted this ECG.   Date: 03/10/2024  EKG Time: 19:47  Rate: 64  Rhythm: normal sinus rhythm  Axis: Normal  Intervals:none  ST&T Change: None  RADIOLOGY Chest x-ray reviewed and interpreted by me with no infiltrate, edema, or effusion.  PROCEDURES:  Critical  Care performed: No  Procedures   MEDICATIONS ORDERED IN ED: Medications - No data to display   IMPRESSION / MDM / ASSESSMENT AND PLAN / ED COURSE  I reviewed the triage vital signs and the nursing notes.                              73 y.o. female with past medical history of hypertension, diabetes, CAD, HFrEF, anemia, GERD, and schizophrenia who presents to the ED with generalized weakness starting today.  Patient's presentation is most consistent with acute presentation with potential threat to life or bodily function.  Differential diagnosis includes, but is not limited to, sepsis, UTI, pneumonia, anemia, electrolyte abnormality, AKI, arrhythmia.  Patient nontoxic-appearing and in no acute distress, vital signs are unremarkable and do not appear concerning for sepsis.  Patient is at her  baseline mental status with no focal neurologic deficits, doubt intracranial process.  We will screen EKG, labs, chest x-ray, and urinalysis.  Chest x-ray is unremarkable, labs without significant anemia, leukocytosis, electrolyte abnormality, or AKI.  LFTs are unremarkable, urinalysis pending at this time.  Patient turned over to oncoming provider pending urinalysis results, if these are unremarkable then she would be appropriate for discharge home with PCP follow-up.      FINAL CLINICAL IMPRESSION(S) / ED DIAGNOSES   Final diagnoses:  Generalized weakness     Rx / DC Orders   ED Discharge Orders     None        Note:  This document was prepared using Dragon voice recognition software and may include unintentional dictation errors.   Willo Dunnings, MD 03/10/24 [phone]

## 2024-03-10 NOTE — ED Notes (Signed)
 Pt confused, combative, hitting at lab tech

## 2024-03-10 NOTE — ED Triage Notes (Signed)
 PT comes in via [facility] from [facility] with complaints of weakness since about noon today, according to group home staff. Staff notes that the pt hasn't been able to perform her daily tasks as normal, such as feeding herself. Pt has no complaints of pain. Pt is alert and oriented x1, which is normal for her baseline.

## 2024-03-11 DIAGNOSIS — R4182 Altered mental status, unspecified: Secondary | ICD-10-CM | POA: Diagnosis not present

## 2024-03-11 LAB — URINALYSIS, ROUTINE W REFLEX MICROSCOPIC
Bilirubin Urine: NEGATIVE
Glucose, UA: NEGATIVE mg/dL
Hgb urine dipstick: NEGATIVE
Ketones, ur: NEGATIVE mg/dL
Nitrite: POSITIVE — AB
Protein, ur: 30 mg/dL — AB
Specific Gravity, Urine: 1.017 (ref 1.005–1.030)
pH: 5 (ref 5.0–8.0)

## 2024-03-11 MED ORDER — CEPHALEXIN 500 MG PO CAPS: 500.0000 mg | ORAL_CAPSULE | Freq: Three times a day (TID) | ORAL | 0 refills | Status: DC

## 2024-03-11 MED ORDER — CEPHALEXIN 500 MG PO CAPS
500.0000 mg | ORAL_CAPSULE | Freq: Once | ORAL | Status: AC
Start: 2024-03-11 — End: 2024-03-11
  Administered 2024-03-11: 500 mg via ORAL
  Filled 2024-03-11: qty 1

## 2024-03-11 NOTE — ED Notes (Signed)
 Patient discharged back to [facility]. Report called to facility, all paperwork with [facility] at time of transport. Pt in NAD.

## 2024-03-11 NOTE — ED Notes (Signed)
 Attempted to ambulate patient, patient unable to ambulate, unable to sit up on bedside assisted. Patient returned to bed at this time.

## 2024-03-24 ENCOUNTER — Inpatient Hospital Stay
Admission: EM | Admit: 2024-03-24 | Discharge: 2024-03-27 | DRG: 637 | Disposition: A | Discharge status: Skilled Nursing Facility | Source: Skilled Nursing Facility | Attending: Student in an Organized Health Care Education/Training Program | Admitting: Student in an Organized Health Care Education/Training Program

## 2024-03-24 ENCOUNTER — Emergency Department

## 2024-03-24 ENCOUNTER — Other Ambulatory Visit: Payer: Self-pay

## 2024-03-24 DIAGNOSIS — E11649 Type 2 diabetes mellitus with hypoglycemia without coma: Principal | ICD-10-CM | POA: Diagnosis present

## 2024-03-24 DIAGNOSIS — Z87891 Personal history of nicotine dependence: Secondary | ICD-10-CM

## 2024-03-24 DIAGNOSIS — E162 Hypoglycemia, unspecified: Secondary | ICD-10-CM | POA: Diagnosis not present

## 2024-03-24 DIAGNOSIS — F209 Schizophrenia, unspecified: Secondary | ICD-10-CM | POA: Diagnosis present

## 2024-03-24 DIAGNOSIS — R001 Bradycardia, unspecified: Secondary | ICD-10-CM | POA: Diagnosis not present

## 2024-03-24 DIAGNOSIS — R633 Feeding difficulties, unspecified: Secondary | ICD-10-CM | POA: Diagnosis present

## 2024-03-24 DIAGNOSIS — I5022 Chronic systolic (congestive) heart failure: Secondary | ICD-10-CM | POA: Diagnosis present

## 2024-03-24 DIAGNOSIS — Z79899 Other long term (current) drug therapy: Secondary | ICD-10-CM

## 2024-03-24 DIAGNOSIS — Z811 Family history of alcohol abuse and dependence: Secondary | ICD-10-CM

## 2024-03-24 DIAGNOSIS — Z6372 Alcoholism and drug addiction in family: Secondary | ICD-10-CM

## 2024-03-24 DIAGNOSIS — T383X5A Adverse effect of insulin and oral hypoglycemic [antidiabetic] drugs, initial encounter: Secondary | ICD-10-CM | POA: Diagnosis present

## 2024-03-24 DIAGNOSIS — Z8249 Family history of ischemic heart disease and other diseases of the circulatory system: Secondary | ICD-10-CM

## 2024-03-24 DIAGNOSIS — I110 Hypertensive heart disease with heart failure: Secondary | ICD-10-CM | POA: Diagnosis present

## 2024-03-24 DIAGNOSIS — D638 Anemia in other chronic diseases classified elsewhere: Secondary | ICD-10-CM

## 2024-03-24 DIAGNOSIS — F32A Depression, unspecified: Secondary | ICD-10-CM | POA: Diagnosis present

## 2024-03-24 DIAGNOSIS — G9341 Metabolic encephalopathy: Secondary | ICD-10-CM | POA: Diagnosis present

## 2024-03-24 DIAGNOSIS — I2510 Atherosclerotic heart disease of native coronary artery without angina pectoris: Secondary | ICD-10-CM | POA: Diagnosis present

## 2024-03-24 DIAGNOSIS — Z515 Encounter for palliative care: Secondary | ICD-10-CM

## 2024-03-24 DIAGNOSIS — F0390 Unspecified dementia without behavioral disturbance: Secondary | ICD-10-CM | POA: Diagnosis present

## 2024-03-24 DIAGNOSIS — Z833 Family history of diabetes mellitus: Secondary | ICD-10-CM

## 2024-03-24 DIAGNOSIS — Z7984 Long term (current) use of oral hypoglycemic drugs: Secondary | ICD-10-CM

## 2024-03-24 DIAGNOSIS — K219 Gastro-esophageal reflux disease without esophagitis: Secondary | ICD-10-CM | POA: Diagnosis present

## 2024-03-24 DIAGNOSIS — I160 Hypertensive urgency: Secondary | ICD-10-CM | POA: Diagnosis present

## 2024-03-24 HISTORY — DX: Unspecified dementia, unspecified severity, without behavioral disturbance, psychotic disturbance, mood disturbance, and anxiety: F03.90

## 2024-03-24 LAB — CBC WITH DIFFERENTIAL/PLATELET
Abs Immature Granulocytes: 0.02 K/uL (ref 0.00–0.07)
Basophils Absolute: 0 K/uL (ref 0.0–0.1)
Basophils Relative: 0 %
Eosinophils Absolute: 0 K/uL (ref 0.0–0.5)
Eosinophils Relative: 0 %
HCT: 38.3 % (ref 36.0–46.0)
Hemoglobin: 12.1 g/dL (ref 12.0–15.0)
Immature Granulocytes: 1 %
Lymphocytes Relative: 22 %
Lymphs Abs: 0.9 K/uL (ref 0.7–4.0)
MCH: 29.4 pg (ref 26.0–34.0)
MCHC: 31.6 g/dL (ref 30.0–36.0)
MCV: 93.2 fL (ref 80.0–100.0)
Monocytes Absolute: 0.2 K/uL (ref 0.1–1.0)
Monocytes Relative: 6 %
Neutro Abs: 2.8 K/uL (ref 1.7–7.7)
Neutrophils Relative %: 71 %
Platelets: 196 K/uL (ref 150–400)
RBC: 4.11 MIL/uL (ref 3.87–5.11)
RDW: 13.1 % (ref 11.5–15.5)
WBC: 3.9 K/uL — ABNORMAL LOW (ref 4.0–10.5)
nRBC: 0 % (ref 0.0–0.2)

## 2024-03-24 LAB — BASIC METABOLIC PANEL WITH GFR
Anion gap: 12 (ref 5–15)
BUN: 15 mg/dL (ref 8–23)
CO2: 25 mmol/L (ref 22–32)
Calcium: 9.5 mg/dL (ref 8.9–10.3)
Chloride: 106 mmol/L (ref 98–111)
Creatinine, Ser: 0.86 mg/dL (ref 0.44–1.00)
GFR, Estimated: >60 mL/min (ref >60)
Glucose, Bld: 42 mg/dL — CL (ref 70–99)
Potassium: 3.6 mmol/L (ref 3.5–5.1)
Sodium: 143 mmol/L (ref 135–145)

## 2024-03-24 LAB — HEMOGLOBIN A1C
Hgb A1c MFr Bld: 5.7 % — ABNORMAL HIGH (ref 4.8–5.6)
Mean Plasma Glucose: 116.89 mg/dL

## 2024-03-24 LAB — GLUCOSE, CAPILLARY: Glucose-Capillary: 76 mg/dL (ref 70–99)

## 2024-03-24 LAB — CBG MONITORING, ED
Glucose-Capillary: 74 mg/dL (ref 70–99)
Glucose-Capillary: 123 mg/dL — ABNORMAL HIGH (ref 70–99)
Glucose-Capillary: 40 mg/dL — CL (ref 70–99)
Glucose-Capillary: 95 mg/dL (ref 70–99)
Glucose-Capillary: 58 mg/dL — ABNORMAL LOW (ref 70–99)
Glucose-Capillary: 145 mg/dL — ABNORMAL HIGH (ref 70–99)

## 2024-03-24 MED ORDER — HYDRALAZINE HCL 20 MG/ML IJ SOLN: 10.0000 mg | Freq: Four times a day (QID) | INTRAMUSCULAR | Status: DC | PRN

## 2024-03-24 MED ORDER — DEXTROSE 10 % IV SOLN: INTRAVENOUS | Status: DC

## 2024-03-24 MED ORDER — DEXTROSE 50 % IV SOLN
1.0000 | Freq: Once | INTRAVENOUS | Status: AC
  Administered 2024-03-24: 50 mL via INTRAVENOUS
  Filled 2024-03-24: qty 50

## 2024-03-24 MED ORDER — ONDANSETRON HCL 4 MG/2ML IJ SOLN: 4.0000 mg | Freq: Four times a day (QID) | INTRAMUSCULAR | Status: DC | PRN

## 2024-03-24 MED ORDER — ENOXAPARIN SODIUM 40 MG/0.4ML IJ SOSY
40.0000 mg | PREFILLED_SYRINGE | INTRAMUSCULAR | Status: AC
Start: 2024-03-24 — End: ?
  Administered 2024-03-25 – 2024-03-26 (×2): 40 mg via SUBCUTANEOUS
  Filled 2024-03-24 (×2): qty 0.4

## 2024-03-24 MED ORDER — ACETAMINOPHEN 500 MG PO TABS: 1000.0000 mg | ORAL_TABLET | Freq: Three times a day (TID) | ORAL | Status: DC | PRN

## 2024-03-24 MED ORDER — THIOTHIXENE 2 MG PO CAPS
2.0000 mg | ORAL_CAPSULE | Freq: Two times a day (BID) | ORAL | Status: DC
Start: 2024-03-24 — End: 2024-03-24

## 2024-03-24 MED ORDER — THIOTHIXENE 1 MG PO CAPS
1.0000 mg | ORAL_CAPSULE | Freq: Every day | ORAL | Status: DC
  Administered 2024-03-25 – 2024-03-27 (×3): 1 mg via ORAL
  Filled 2024-03-24 (×3): qty 1

## 2024-03-24 MED ORDER — HALOPERIDOL LACTATE 5 MG/ML IJ SOLN
2.0000 mg | Freq: Four times a day (QID) | INTRAMUSCULAR | Status: DC | PRN
  Administered 2024-03-25: 2 mg via INTRAVENOUS
  Filled 2024-03-24: qty 1

## 2024-03-24 MED ORDER — LORAZEPAM 2 MG/ML IJ SOLN
0.5000 mg | Freq: Once | INTRAMUSCULAR | Status: AC
  Administered 2024-03-24: 0.5 mg via INTRAVENOUS
  Filled 2024-03-24: qty 1

## 2024-03-24 MED ORDER — THIOTHIXENE 2 MG PO CAPS
2.0000 mg | ORAL_CAPSULE | Freq: Every day | ORAL | Status: AC
Start: 2024-03-24 — End: ?
  Administered 2024-03-26: 2 mg via ORAL
  Filled 2024-03-24 (×4): qty 1

## 2024-03-24 MED ORDER — LISINOPRIL 10 MG PO TABS
10.0000 mg | ORAL_TABLET | Freq: Every day | ORAL | Status: DC
  Administered 2024-03-25 – 2024-03-27 (×3): 10 mg via ORAL
  Filled 2024-03-24 (×3): qty 1

## 2024-03-24 NOTE — ED Notes (Signed)
 Pt continues to be combative at this time with touch and attempting to get out of bed.

## 2024-03-24 NOTE — ED Notes (Signed)
 Pt combative at this time. Pt attempting to get out of bed. RN unable to get pt blood at this time due to pt being combative. RN notified MD.

## 2024-03-24 NOTE — ED Triage Notes (Signed)
 Pt to ED via [facility] from [facility] for hypoglycemia. EMS was called because pt was unresponsive while sitting on couch. CBG was 39 when EMS arrived. Pt did eat breakfast. Hx DM2.  EMS VS:   184/78, HR 60, 98% RA.  Last CBG was 55 after D10 partially finished.  Pt received 250mL of IV D10. 20# to L wrist.  Pt confused. Respirations unlabored, skin dry.

## 2024-03-24 NOTE — ED Notes (Signed)
 Fall band, non slip socks and bed alarm placed on pt for fall precautions.

## 2024-03-24 NOTE — H&P (Signed)
 History and Physical    STEPAHNIE Mueller FMW:[id number] DOB: 21-Aug-1950 DOA: 03/24/2024  PCP: [facility]  [facility]  I have personally briefly reviewed patient's old medical records in [facility]  Chief Complaint: unresponsiveness   HPI: Erica Mueller is a 73 y.o. female with medical history significant of DM2, dementia, HTN who presented from ALF due to unresponsiveness and found to have hypoglycemia to 39.   Pt was sedated with IV ativan  due to combativeness, therefore, history could not be obtained.   Per ED notes, [facility] after being found unresponsive while sitting on the couch.  BG was 39 when EMS arrived.  When pt arrived in the ED, she was confused, became combative and attempting to get out of bed, therefore was given IV ativan .  From med rec, it appears pt is on glimepiride  and metformin  at the facility.    ED Course: initial vitals: afebrile, pulse 56, BP 197/55, RR 16, sating 100% on room air.  Labs unremarkable except for BG of 42.  CT head no acute finding.  Pt received D50 with BG improvement to 123, however, about an hour later, BG dropped down to 58 again.  Pt was given another D50 and started on D10 infusion.  Pt was admitted for observation.  Assessment/Plan  Hypoglycemia 2/2 Sulfonylurea use --pt is on both glimepiride  and metformin  PTA.   --cont D10 gtt --BG q4h to monitor for hypoglycemia --d/c glimepiride  at discharge.  Hx of DM2 --last A1c 7.9 back in 2022 --obtain A1c  HTN urgency --BP elevated to 190's on presentation.  Possibly due to stress.   --resume home Lisinopril  at increased 10 mg daily (up from 5 mg) --hold home Lopressor  due to low HR  Acute metabolic encephalopathy --unresponsive due to hypoglycemia.  Baseline dementia --pt was combative and agitated in the ED --IV haldol  PRN for agitation   DVT prophylaxis: Lovenox  SQ Code Status: Full code  Family  Communication:   Disposition Plan: back to memory care unit  Consults called: none Level of care: Med-Surg   Review of Systems: As per HPI otherwise complete review of systems negative.   Past Medical History:  Diagnosis Date   Coronary artery disease    Dementia (HCC)    Depression    Diabetes mellitus without complication (HCC)    Elevated lipids    GERD (gastroesophageal reflux disease)    Hypertension    Schizophrenia (HCC)    Syphilis (acquired)     Past Surgical History:  Procedure Laterality Date   COLONOSCOPY     COLONOSCOPY WITH PROPOFOL  N/A 09/01/2015   Procedure: COLONOSCOPY WITH PROPOFOL ;  Surgeon: Deward Mueller Piedmont, MD;  Location: [facility] ENDOSCOPY;  Service: Gastroenterology;  Laterality: N/A;   IR FLUORO RM 30-60 MIN  02/23/2021   IR GASTROSTOMY TUBE MOD SED  02/24/2021   IR REPLC GASTRO/COLONIC TUBE PERCUT W/FLUORO  02/27/2021   TUBAL LIGATION     wisdom teeth removal       reports that she has quit smoking. Her smoking use included cigarettes. She started smoking about 54 years ago. She has a 54.3 pack-year smoking history. She has never used smokeless tobacco. She reports current alcohol use of about 3.0 standard drinks of alcohol per week. She reports that she does not currently use drugs after having used the following drugs: Marijuana.  No Known Allergies  Family History  Problem Relation Age of Onset  Cancer Mother    Alcoholism Mother    Bone cancer Father    Heart disease Father    Diabetes Maternal Grandmother    Suicidality Paternal Uncle    Breast cancer Neg Hx     Prior to Admission medications   Medication Sig Start Date End Date Taking? Authorizing Provider  acetaminophen  (TYLENOL ) 325 MG tablet Take 2 tablets (650 mg total) by mouth every 4 (four) hours as needed for mild pain (temp > 101.5). 03/29/21   Amin, Sumayya, MD  cephALEXin  (KEFLEX ) 500 MG capsule Take 1 capsule (500 mg total) by mouth 3 (three) times daily. 03/11/24   Viviann Pastor, MD   cholecalciferol  (VITAMIN D3) 25 MCG (1000 UNIT) tablet Take 1,000 Units by mouth daily.    [provider]  docusate sodium  (COLACE) 100 MG capsule Take 1 capsule (100 mg total) by mouth 2 (two) times daily as needed for mild constipation. 03/29/21   Amin, Sumayya, MD  glimepiride  (AMARYL ) 2 MG tablet Take 2 mg by mouth daily. 09/01/20   [provider]  lisinopril  (ZESTRIL ) 5 MG tablet Place 1 tablet (5 mg total) into feeding tube daily. 03/29/21   Amin, Sumayya, MD  lovastatin  (MEVACOR ) 40 MG tablet Take 1 tablet (40 mg total) by mouth at bedtime. 12/20/17   Erica Cathryne BROCKS, MD  metFORMIN  (GLUCOPHAGE ) 1000 MG tablet Take 1,000 mg by mouth 2 (two) times daily.    [provider]  metoprolol  tartrate (LOPRESSOR ) 25 MG tablet Take 0.5 tablets (12.5 mg total) by mouth 2 (two) times daily. 03/29/21   Caleen Qualia, MD  Nutritional Supplements (FEEDING SUPPLEMENT, OSMOLITE 1.2 CAL,) LIQD Place 1,000 mLs into feeding tube continuous. 03/29/21   Amin, Sumayya, MD  polyethylene glycol (MIRALAX  / GLYCOLAX ) 17 g packet Take 17 g by mouth daily.    [provider]  senna-docusate (SENOKOT-S) 8.6-50 MG tablet Take 1 tablet by mouth 2 (two) times daily. (0800 and 1600)    [provider]  thiothixene  (NAVANE ) 2 MG capsule Take 1 capsule (2 mg total) by mouth 2 (two) times daily. 10/24/17   McNew, Silvano SAUNDERS, MD  vitamin C (ASCORBIC ACID ) 500 MG tablet Take 500 mg by mouth 2 (two) times daily.    [provider]  Water  For Irrigation, Sterile (FREE WATER ) SOLN Place 60 mLs into feeding tube every 4 (four) hours. 03/29/21   Amin, Sumayya, MD  zinc  gluconate 50 MG tablet Take 50 mg by mouth daily.    [provider]    Physical Exam: Vitals:   03/24/24 1223 03/24/24 1225 03/24/24 1553  BP: (!) 197/55  (!) 163/45  Pulse: (!) 56  (!) 55  Resp: 16  17  Temp: 97.6 F (36.4 C)  98 F (36.7 C)  TempSrc: Axillary  Axillary  SpO2: 100%  100%  Weight:  62  kg   Height:  5' 3 (1.6 m)     Constitutional: NAD, sleeping CV: No cyanosis.   RESP: normal respiratory effort, on RA Extremities: No effusions, edema in BLE SKIN: warm, dry   Labs on Admission: I have personally reviewed labs and imaging studies  Time spent: 60 minutes  Ellouise Haber MD [facility]  If 7PM-7AM, please contact night-coverage 03/24/2024, 4:07 PM

## 2024-03-24 NOTE — ED Provider Notes (Signed)
 [facility] Provider Note    Event Date/Time   First MD Initiated Contact with Patient 03/24/24 1228     (approximate)   History   Hypoglycemia   HPI  Erica Mueller is a 73 y.o. female  who presents to the emergency department today from memory care unit because of decreased responsiveness and hypoglycemia. Per paperwork patient does have history of DM but it does not appear that she is on any blood sugar controlling medications. Today staff found her slumped over and minimally responsive. When EMS arrived blood sugars were found to be in the 30s, D10 was started. The patient herself is incapable of giving any significant history.     Physical Exam   Triage Vital Signs: ED Triage Vitals  Encounter Vitals Group     BP 03/24/24 1223 (!) 197/55     Girls Systolic BP Percentile --      Girls Diastolic BP Percentile --      Boys Systolic BP Percentile --      Boys Diastolic BP Percentile --      Pulse Rate 03/24/24 1223 (!) 56     Resp 03/24/24 1223 16     Temp 03/24/24 1223 97.6 F (36.4 C)     Temp Source 03/24/24 1223 Axillary     SpO2 03/24/24 1223 100 %     Weight 03/24/24 1225 136 lb 11 oz (62 kg)     Height 03/24/24 1225 5' 3 (1.6 m)     Head Circumference --      Peak Flow --      Pain Score --      Pain Loc --      Pain Education --      Exclude from Growth Chart --     Most recent vital signs: Vitals:   03/24/24 1223  BP: (!) 197/55  Pulse: (!) 56  Resp: 16  Temp: 97.6 F (36.4 C)  SpO2: 100%   General: Awake, alert, not oriented. CV:  Good peripheral perfusion. Regular rate and rhythm. Resp:  Normal effort. Lungs clear. Abd:  No distention. Non tender.   ED Results / Procedures / Treatments   Labs (all labs ordered are listed, but only abnormal results are displayed) Labs Reviewed  CBC WITH DIFFERENTIAL/PLATELET - Abnormal; Notable for the following components:      Result Value   WBC 3.9 (*)    All other  components within normal limits  BASIC METABOLIC PANEL WITH GFR - Abnormal; Notable for the following components:   Glucose, Bld 42 (*)    All other components within normal limits  CBG MONITORING, ED - Abnormal; Notable for the following components:   Glucose-Capillary 40 (*)    All other components within normal limits  CBG MONITORING, ED - Abnormal; Notable for the following components:   Glucose-Capillary 123 (*)    All other components within normal limits  CBG MONITORING, ED - Abnormal; Notable for the following components:   Glucose-Capillary 58 (*)    All other components within normal limits  URINALYSIS, ROUTINE W REFLEX MICROSCOPIC  CBG MONITORING, ED  CBG MONITORING, ED     EKG  I, Guadalupe Eagles, attending physician, personally viewed and interpreted this EKG  EKG Time: 1233 Rate: 63 Rhythm: sinus rhythm Axis: normal Intervals: qtc 423 QRS: narrow ST changes: no st elevation Impression: significant artifact limits interpretation, no STEMI appreciated    RADIOLOGY I independently interpreted and visualized the CT head. My  interpretation: No ICH Radiology interpretation:  IMPRESSION:  1. No acute intracranial abnormality.  2. Mild to moderate chronic small vessel ischemic disease.     PROCEDURES:  Critical Care performed: Yes  CRITICAL CARE Performed by: Guadalupe Eagles   Total critical care time: 30 minutes  Critical care time was exclusive of separately billable procedures and treating other patients.  Critical care was necessary to treat or prevent imminent or life-threatening deterioration.  Critical care was time spent personally by me on the following activities: development of treatment plan with patient and/or surrogate as well as nursing, discussions with consultants, evaluation of patient's response to treatment, examination of patient, obtaining history from patient or surrogate, ordering and performing treatments and interventions,  ordering and review of laboratory studies, ordering and review of radiographic studies, pulse oximetry and re-evaluation of patient's condition.   Procedures    MEDICATIONS ORDERED IN ED: Medications - No data to display   IMPRESSION / MDM / ASSESSMENT AND PLAN / ED COURSE  I reviewed the triage vital signs and the nursing notes.                              Differential diagnosis includes, but is not limited to, infection, medication error, poor po intake  Patient's presentation is most consistent with acute presentation with potential threat to life or bodily function.   Patient presented to the emergency department today from living facility because of concerns for altered mental status.  Upon EMS arrival she was found to be significantly hypoglycemic.  Patient was agitated upon arrival to the emergency department.  Did require some medication to help with that.  The patients blood sugar however continued to go low after further dextrose  boluses. Because of this a d10 infusion was ordered. Head CT without concerning abnormalities. Will plan on admission to the hospitalist service for further work up and management.     FINAL CLINICAL IMPRESSION(S) / ED DIAGNOSES   Final diagnoses:  Hypoglycemia      Note:  This document was prepared using Dragon voice recognition software and may include unintentional dictation errors.    Eagles Guadalupe, MD 03/24/24 [id number]

## 2024-03-24 NOTE — ED Notes (Addendum)
 RN unable to get pt VS due to pt being combative and not keeping cords on person. Resp even and unlabored at this time. Skin color is WNL.

## 2024-03-25 DIAGNOSIS — T383X5A Adverse effect of insulin and oral hypoglycemic [antidiabetic] drugs, initial encounter: Secondary | ICD-10-CM | POA: Diagnosis present

## 2024-03-25 DIAGNOSIS — F03B11 Unspecified dementia, moderate, with agitation: Secondary | ICD-10-CM | POA: Diagnosis not present

## 2024-03-25 DIAGNOSIS — Z833 Family history of diabetes mellitus: Secondary | ICD-10-CM | POA: Diagnosis not present

## 2024-03-25 DIAGNOSIS — Z87891 Personal history of nicotine dependence: Secondary | ICD-10-CM | POA: Diagnosis not present

## 2024-03-25 DIAGNOSIS — I5022 Chronic systolic (congestive) heart failure: Secondary | ICD-10-CM | POA: Diagnosis present

## 2024-03-25 DIAGNOSIS — K219 Gastro-esophageal reflux disease without esophagitis: Secondary | ICD-10-CM | POA: Diagnosis present

## 2024-03-25 DIAGNOSIS — Z811 Family history of alcohol abuse and dependence: Secondary | ICD-10-CM | POA: Diagnosis not present

## 2024-03-25 DIAGNOSIS — G309 Alzheimer's disease, unspecified: Secondary | ICD-10-CM | POA: Diagnosis not present

## 2024-03-25 DIAGNOSIS — F0390 Unspecified dementia without behavioral disturbance: Secondary | ICD-10-CM | POA: Diagnosis present

## 2024-03-25 DIAGNOSIS — Z8249 Family history of ischemic heart disease and other diseases of the circulatory system: Secondary | ICD-10-CM | POA: Diagnosis not present

## 2024-03-25 DIAGNOSIS — Z79899 Other long term (current) drug therapy: Secondary | ICD-10-CM | POA: Diagnosis not present

## 2024-03-25 DIAGNOSIS — R633 Feeding difficulties, unspecified: Secondary | ICD-10-CM | POA: Diagnosis present

## 2024-03-25 DIAGNOSIS — F209 Schizophrenia, unspecified: Secondary | ICD-10-CM | POA: Diagnosis present

## 2024-03-25 DIAGNOSIS — E162 Hypoglycemia, unspecified: Secondary | ICD-10-CM | POA: Diagnosis present

## 2024-03-25 DIAGNOSIS — Z6372 Alcoholism and drug addiction in family: Secondary | ICD-10-CM | POA: Diagnosis not present

## 2024-03-25 DIAGNOSIS — F32A Depression, unspecified: Secondary | ICD-10-CM | POA: Diagnosis present

## 2024-03-25 DIAGNOSIS — I2510 Atherosclerotic heart disease of native coronary artery without angina pectoris: Secondary | ICD-10-CM | POA: Diagnosis present

## 2024-03-25 DIAGNOSIS — G9341 Metabolic encephalopathy: Secondary | ICD-10-CM | POA: Diagnosis present

## 2024-03-25 DIAGNOSIS — Z7984 Long term (current) use of oral hypoglycemic drugs: Secondary | ICD-10-CM | POA: Diagnosis not present

## 2024-03-25 DIAGNOSIS — I160 Hypertensive urgency: Secondary | ICD-10-CM | POA: Diagnosis present

## 2024-03-25 DIAGNOSIS — Z515 Encounter for palliative care: Secondary | ICD-10-CM | POA: Diagnosis not present

## 2024-03-25 DIAGNOSIS — I110 Hypertensive heart disease with heart failure: Secondary | ICD-10-CM | POA: Diagnosis present

## 2024-03-25 DIAGNOSIS — E11649 Type 2 diabetes mellitus with hypoglycemia without coma: Secondary | ICD-10-CM | POA: Diagnosis present

## 2024-03-25 DIAGNOSIS — R001 Bradycardia, unspecified: Secondary | ICD-10-CM | POA: Diagnosis not present

## 2024-03-25 DIAGNOSIS — F02B11 Dementia in other diseases classified elsewhere, moderate, with agitation: Secondary | ICD-10-CM | POA: Diagnosis not present

## 2024-03-25 LAB — GLUCOSE, CAPILLARY
Glucose-Capillary: 120 mg/dL — ABNORMAL HIGH (ref 70–99)
Glucose-Capillary: 121 mg/dL — ABNORMAL HIGH (ref 70–99)
Glucose-Capillary: 141 mg/dL — ABNORMAL HIGH (ref 70–99)
Glucose-Capillary: 168 mg/dL — ABNORMAL HIGH (ref 70–99)
Glucose-Capillary: 57 mg/dL — ABNORMAL LOW (ref 70–99)
Glucose-Capillary: 92 mg/dL (ref 70–99)
Glucose-Capillary: 92 mg/dL (ref 70–99)

## 2024-03-25 MED ORDER — FREE WATER: 30.0000 mL | Status: DC

## 2024-03-25 MED ORDER — OSMOLITE 1.2 CAL PO LIQD: 1000.0000 mL | ORAL | Status: DC

## 2024-03-25 MED ORDER — HALOPERIDOL LACTATE 5 MG/ML IJ SOLN
5.0000 mg | Freq: Once | INTRAMUSCULAR | Status: AC
  Administered 2024-03-25: 5 mg via INTRAMUSCULAR
  Filled 2024-03-25: qty 1

## 2024-03-25 MED ORDER — METOPROLOL TARTRATE 25 MG PO TABS: 12.5000 mg | ORAL_TABLET | Freq: Two times a day (BID) | ORAL | Status: DC

## 2024-03-25 MED ORDER — ZIPRASIDONE MESYLATE 20 MG IM SOLR
10.0000 mg | Freq: Once | INTRAMUSCULAR | Status: AC
  Administered 2024-03-25: 10 mg via INTRAMUSCULAR
  Filled 2024-03-25: qty 20

## 2024-03-25 MED ORDER — QUETIAPINE FUMARATE 25 MG PO TABS
50.0000 mg | ORAL_TABLET | Freq: Two times a day (BID) | ORAL | Status: DC
  Administered 2024-03-25 – 2024-03-27 (×4): 50 mg via ORAL
  Filled 2024-03-25 (×5): qty 2

## 2024-03-25 MED ORDER — DEXTROSE-SODIUM CHLORIDE 5-0.9 % IV SOLN
INTRAVENOUS | Status: AC
  Administered 2024-03-26: 500 mL via INTRAVENOUS

## 2024-03-25 MED ORDER — SERTRALINE HCL 50 MG PO TABS
50.0000 mg | ORAL_TABLET | Freq: Every day | ORAL | Status: DC
  Administered 2024-03-25 – 2024-03-27 (×3): 50 mg via ORAL
  Filled 2024-03-25 (×3): qty 1

## 2024-03-25 NOTE — Progress Notes (Signed)
 Initial Nutrition Assessment  DOCUMENTATION CODES:   Severe malnutrition in context of chronic illness  INTERVENTION:   -Dysphagia 1 diet with nectar thick liquids -TF via g-tube:   Osmolite 1.2 @ 60 ml/hr   30 ml free water  flush every 4 hours  Tube feeding regimen provides 1728 kcal (100% of needs), 80 grams of protein, and 1181 ml of H2O. Total free water : 1361 ml daily  NUTRITION DIAGNOSIS:   Severe Malnutrition related to chronic illness (dementia) as evidenced by moderate fat depletion, severe fat depletion, moderate muscle depletion, severe muscle depletion.  GOAL:   Patient will meet greater than or equal to 90% of their needs  MONITOR:   PO intake, TF tolerance  REASON FOR ASSESSMENT:   Consult Enteral/tube feeding initiation and management  ASSESSMENT:   PMH of DM2, dementia, HTN who presented from ALF due to unresponsiveness and found to have hypoglycemia to 39.  Admitted with hypoglycemia secondary to sulfonylurea use.   Reviewed I/O's: -700 ml x 24 hours   Erica Mueller is a resident of [facility]. She is unable to provide accurate history secondary to dementia.   Spoke with Erica Mueller at bedside, who was pleasant and in good spirits today. No family or caregivers present. She reports she eats by mouth PTA, however, is unsure if she has any diet restrictions. Noted she has poor dentition.   When asked about her TF regimen, she replies What's that?. Per prior RD notes on 03/2021, she was on a dysphagia 1 diet with nectar thick liquids and received Osmolite 1.2 @ 60 ml/hr.   No weight loss noted.   Medications reviewed and include dextrose  10% infusion @ 100 ml/hr.   Lab Results  Component Value Date   HGBA1C 5.7 (H) 03/24/2024   PTA DM medications are 1000 mg metformin  BID and 4 mg amaryl  daily.   Labs reviewed: CBGS: 58-145 (inpatient orders for glycemic control are none).    NUTRITION - FOCUSED PHYSICAL EXAM:  Flowsheet Row Most Recent Value   Orbital Region Moderate depletion  Upper Arm Region Severe depletion  Thoracic and Lumbar Region Severe depletion  Buccal Region Moderate depletion  Temple Region Severe depletion  Clavicle Bone Region Severe depletion  Clavicle and Acromion Bone Region Severe depletion  Scapular Bone Region Severe depletion  Dorsal Hand Moderate depletion  Patellar Region Severe depletion  Anterior Thigh Region Severe depletion  Posterior Calf Region Severe depletion  Edema (RD Assessment) None  Hair Reviewed  Eyes Reviewed  Mouth Reviewed  Skin Reviewed  Nails Reviewed    Diet Order:   Diet Order             Diet Carb Modified Fluid consistency: Thin; Room service appropriate? Yes  Diet effective now                   EDUCATION NEEDS:   Not appropriate for education at this time  Skin:  Skin Assessment: Reviewed RN Assessment  Last BM:  Unknown  Height:   Ht Readings from Last 1 Encounters:  03/24/24 5' 3 (1.6 m)    Weight:   Wt Readings from Last 1 Encounters:  03/24/24 62 kg    Ideal Body Weight:  52.3 kg  BMI:  Body mass index is 24.21 kg/m.  Estimated Nutritional Needs:   Kcal:  1700-1900  Protein:  80-95 grans  Fluid:  1.7-1.9 L    Erica Mueller, RD, LDN, CDCES Registered Dietitian III Certified Diabetes Care and Education  Specialist If unable to reach this RD, please use RD Inpatient group chat on secure chat between hours of 8am-4 pm daily

## 2024-03-25 NOTE — Progress Notes (Signed)
  Progress Note   Patient: Erica Mueller FMW:[id number] DOB: Sep 08, 1950 DOA: 03/24/2024     0 DOS: the patient was seen and examined on 03/25/2024   Brief hospital course: 73 y.o. female with medical history significant of DM2, dementia, HTN who presented from ALF due to unresponsiveness and found to have hypoglycemia to 39.   Per ED notes, pt came from [facility] after being found unresponsive while sitting on the couch. BG was 39 when EMS arrived. When pt arrived in the ED, she was confused, became combative and attempting to get out of bed, therefore was given IV ativan . Per facility med rec, pt was noted to be on glimepiride  and metformin  at the facility.   Assessment and Plan: Hypoglycemia --pt is on both glimepiride  and metformin  PTA.   --continued on D10 gtt --plan to d/c glimepiride  at discharge. - try to encourage po as able   Hx of DM2 --last A1c 7.9 back in 2022 --repeat A1c 5.7 -hold diabetic meds for now -encourage po as able. Most recent SLP note from 2022 noted dysphagia 1 with nectar thick liquids   HTN urgency --BP elevated to 190's on presentation.  Possibly due to stress.   --resume home Lisinopril  at increased 10 mg daily (up from 5 mg) --home Lopressor  currently on hold due to low HR   Acute metabolic encephalopathy --unresponsive due to hypoglycemia. -Now more alert and combative to staff   Baseline dementia --pt was combative and agitated in the ED --continue with PRN haldol  -will resume home scheduled seroquel      Subjective: Unable to assess given mentation  Physical Exam: Vitals:   03/24/24 1720 03/24/24 2008 03/25/24 0419 03/25/24 0811  BP: 120/67 (!) 176/42 (!) 170/42 (!) 166/83  Pulse: 80 (!) 56 (!) 57 93  Resp: 18 16 16    Temp: 98 F (36.7 C) 97.7 F (36.5 C) (!) 97.5 F (36.4 C)   TempSrc: Axillary  Oral   SpO2: 100% 100% 100% 91%  Weight:      Height:       General exam: Conversant, in no acute  distress Respiratory system: normal chest rise, clear, no audible wheezing Cardiovascular system: regular rhythm, s1-s2 Gastrointestinal system: Nondistended, nontender, pos BS Central nervous system: No seizures, no tremors Extremities: No cyanosis, no joint deformities Skin: No rashes, no pallor Psychiatry: Unable to assess given mentation  Data Reviewed:  Labs reviewed: Na 143, K 3.6, Cr 0.86, WBC 3.9, Hgb 12.1, Plts 196  Family Communication: Pt in room, family not at bedside  Disposition: Status is: Observation The patient will require care spanning > 2 midnights and should be moved to inpatient because: severity of illness  Planned Discharge Destination: Memory care unit    Author: Garnette Pelt, MD 03/25/2024 2:00 PM  For on call review [email or web address].

## 2024-03-25 NOTE — Hospital Course (Signed)
 73 y.o. female with medical history significant of DM2, dementia, HTN who presented from ALF due to unresponsiveness and found to have hypoglycemia to 39.   Per ED notes, pt came from [facility] after being found unresponsive while sitting on the couch. BG was 39 when EMS arrived. When pt arrived in the ED, she was confused, became combative and attempting to get out of bed, therefore was given IV ativan . Per facility med rec, pt was noted to be on glimepiride  and metformin  at the facility.

## 2024-03-25 NOTE — Progress Notes (Signed)
 Patient hitting and trying to bite staff while getting her blood sugar and could not be redirected. PRN IV Haldol  given.

## 2024-03-25 NOTE — Care Plan (Signed)
 Patient has been trying to bite, spit and hit staff. Patient has refused multiple interventions, unable to redirect. Multiple PRNs administered.  IV team was able to establish a new IV

## 2024-03-25 NOTE — Progress Notes (Signed)
 Unable to complete admission questions. Patient unable to answer any questions appropriately. Patient is only oriented to self. Patient from a outside memory care unit and cannot give any past medical history.

## 2024-03-25 NOTE — Plan of Care (Signed)

## 2024-03-25 NOTE — Care Management Obs Status (Signed)
 MEDICARE OBSERVATION STATUS NOTIFICATION   Patient Details  Name: Erica Mueller MRN: [id number] Date of Birth: 1950/06/27   Medicare Observation Status Notification Given:  Yes    Osvaldo Lamping W, CMA 03/25/2024, 10:06 AM

## 2024-03-26 DIAGNOSIS — Z515 Encounter for palliative care: Secondary | ICD-10-CM | POA: Diagnosis not present

## 2024-03-26 DIAGNOSIS — E162 Hypoglycemia, unspecified: Secondary | ICD-10-CM | POA: Diagnosis not present

## 2024-03-26 DIAGNOSIS — F03B11 Unspecified dementia, moderate, with agitation: Secondary | ICD-10-CM

## 2024-03-26 LAB — COMPREHENSIVE METABOLIC PANEL WITH GFR
ALT: 21 U/L (ref 0–44)
AST: 30 U/L (ref 15–41)
Albumin: 2.9 g/dL — ABNORMAL LOW (ref 3.5–5.0)
Alkaline Phosphatase: 37 U/L — ABNORMAL LOW (ref 38–126)
Anion gap: 10 (ref 5–15)
BUN: 8 mg/dL (ref 8–23)
CO2: 24 mmol/L (ref 22–32)
Calcium: 8.4 mg/dL — ABNORMAL LOW (ref 8.9–10.3)
Chloride: 108 mmol/L (ref 98–111)
Creatinine, Ser: 0.85 mg/dL (ref 0.44–1.00)
GFR, Estimated: >60 mL/min (ref >60)
Glucose, Bld: 123 mg/dL — ABNORMAL HIGH (ref 70–99)
Potassium: 3.3 mmol/L — ABNORMAL LOW (ref 3.5–5.1)
Sodium: 142 mmol/L (ref 135–145)
Total Bilirubin: 0.5 mg/dL (ref 0.0–1.2)
Total Protein: 6.2 g/dL — ABNORMAL LOW (ref 6.5–8.1)

## 2024-03-26 LAB — CBC
HCT: 36.3 % (ref 36.0–46.0)
Hemoglobin: 11.2 g/dL — ABNORMAL LOW (ref 12.0–15.0)
MCH: 29.2 pg (ref 26.0–34.0)
MCHC: 30.9 g/dL (ref 30.0–36.0)
MCV: 94.8 fL (ref 80.0–100.0)
Platelets: 168 K/uL (ref 150–400)
RBC: 3.83 MIL/uL — ABNORMAL LOW (ref 3.87–5.11)
RDW: 13 % (ref 11.5–15.5)
WBC: 3.6 K/uL — ABNORMAL LOW (ref 4.0–10.5)
nRBC: 0 % (ref 0.0–0.2)

## 2024-03-26 LAB — GLUCOSE, CAPILLARY
Glucose-Capillary: 107 mg/dL — ABNORMAL HIGH (ref 70–99)
Glucose-Capillary: 115 mg/dL — ABNORMAL HIGH (ref 70–99)
Glucose-Capillary: 124 mg/dL — ABNORMAL HIGH (ref 70–99)
Glucose-Capillary: 227 mg/dL — ABNORMAL HIGH (ref 70–99)
Glucose-Capillary: 98 mg/dL (ref 70–99)
Glucose-Capillary: 98 mg/dL (ref 70–99)

## 2024-03-26 MED ORDER — POTASSIUM CHLORIDE CRYS ER 20 MEQ PO TBCR
40.0000 meq | EXTENDED_RELEASE_TABLET | Freq: Once | ORAL | Status: AC
  Administered 2024-03-26: 40 meq via ORAL
  Filled 2024-03-26: qty 2

## 2024-03-26 NOTE — Progress Notes (Signed)
  Progress Note   Patient: Erica Mueller FMW:[id number] DOB: 30-Apr-1951 DOA: 03/24/2024     1 DOS: the patient was seen and examined on 03/26/2024   Brief hospital course: 73 y.o. female with medical history significant of DM2, dementia, HTN who presented from ALF due to unresponsiveness and found to have hypoglycemia to 39.   Per ED notes, pt came from [facility] after being found unresponsive while sitting on the couch. BG was 39 when EMS arrived. When pt arrived in the ED, she was confused, became combative and attempting to get out of bed, therefore was given IV ativan . Per facility med rec, pt was noted to be on glimepiride  and metformin  at the facility.   Assessment and Plan: Hypoglycemia --Had been on both glimepiride  and metformin  PTA.   --had been continued on dextrose  containing fluids --plan to d/c glimepiride  at discharge. - encourage PO intake. Changed to regular diet. SLP to evaluate   Hx of DM2 --last A1c 7.9 back in 2022 --repeat A1c 5.7 -hold diabetic meds for now -encourage po as able. Most recent SLP note from 2022 noted dysphagia 1 with nectar thick liquids. SLP to re-eval. Pt noted to be on regular diet more recently   HTN urgency --BP elevated to 190's on presentation.  Possibly due to stress.   --resume home Lisinopril  at increased 10 mg daily (up from 5 mg) --home Lopressor  currently on hold due to low HR at presentation   Acute metabolic encephalopathy --initially presented unresponsive due to hypoglycemia. -Now more alert and intermittently combative to staff   Baseline dementia --pt was combative and agitated in the ED --continue with PRN haldol  -cont home scheduled seroquel      Subjective: Difficult to assess given mentation  Physical Exam: Vitals:   03/25/24 2022 03/26/24 0452 03/26/24 0500 03/26/24 0800  BP: (!) 140/85 (!) 114/92  137/85  Pulse: 93 64  72  Resp: 18 18  18   Temp: 97.6 F (36.4 C) 97.6 F (36.4 C)     TempSrc:      SpO2: 97% 98%  96%  Weight:   52.3 kg   Height:       General exam: Asleep, arousable, laying in bed, in nad Respiratory system: Normal respiratory effort, no wheezing Cardiovascular system: regular rate, s1, s2 Gastrointestinal system: Soft, nondistended, positive BS Central nervous system: CN2-12 grossly intact, strength intact Extremities: Perfused, no clubbing Skin: Normal skin turgor, no notable skin lesions seen Psychiatry: difficult to assess given mentation  Data Reviewed:  Labs reviewed: Na 142, K 3.3, Cr 0.85, WBC 3.6, Hgb 11.2, Plts 168  Family Communication: Pt in room, family not at bedside  Disposition: Status is: Inpatient Cont inpt stay because: severity of illness  Planned Discharge Destination: Memory care unit    Author: Garnette Pelt, MD 03/26/2024 2:45 PM  For on call review [email or web address].

## 2024-03-26 NOTE — Consult Note (Signed)
 Consultation Note Date: 03/26/2024 at 1000  Patient Name: Erica Mueller  DOB: [birth year]  MRN: [id number]  Age / Sex: 73 y.o., female  PCP: [facility] Referring Physician: Cindy Garnette POUR, MD  HPI/Patient Profile: 73 y.o. female  with past medical history of dementia, schizophrenia, type 2 diabetes, HTN, former smoker, CAD, Chronic diastoc HFrEF, moderate MR, anemia, GERD, history of syphilis, depression with history of ECT treatments, and recent ED visit (2 weeks ago - weakness, unable to feed/care for self) admitted [facility] on 03/24/2024 with unresponsiveness and hypoglycemia (CBG of 39).  Upon arrival to the ED, patient was confused, combative, and attempted to get out of the bed.  IV Ativan  given at that time.  Patient is being treated for hypoglycemia due to [place] urea use (both glimepiride  and metformin  taken PTA), history of diabetes type 2 (A1c is 5.7), HTN urgency, acute metabolic encephalopathy with baseline dementia.  PMT was consulted to support patient and family goals of care discussions.  Clinical Assessment and Goals of Care: Extensive chart review completed prior to meeting patient including labs, vital signs, imaging, progress notes, orders, and available advanced directive documents from current and previous encounters. I then met with at bedside.  Patient is asleep but awakens to my presence.  She makes eye contact with me, groans, and returns back to sleep.  Multiple attempts made to awaken patient and engage in discussions.  Patient continued to keep her eyes closed and appeared to want to fall asleep/be left alone.  Patient remains unable to participate in goals of care medical decision making independently at this time.  I attempted to decipher patient's next of kin.  As per chart review, patient has 2 children-1 daughter and 1 son.  Previous notes reflect that  patient's daughter Patient's son LC has helped make decisions for patient in the past.  However, his contact info is not listed in the demographics.  I secure chatted with attending Dr. Cindy and [facility] CM Marinda as to who would be the patient's next point of contact the next of kin.  I attempted to speak with patient's granddaughter as she is listed in demographics. No answer and unable to leave voicemail.  I then spoke with patient's brother Wadie as he is listed in demographics.  He shares that his sister Erica Mueller is more familiar with patient's health and he defers further discussion to his sister Erica Mueller.  I then spoke with patient's sister Erica Mueller over the phone.  Brief medical update given.  Education provided on hypoglycemia, treatment with IV fluids/dextrose , encouraging p.o. intake, monitoring CBG, and attempting to redirect and appropriately manage patient's intermittent agitation.  Discussed dementia as a chronic, progressive, and irreversible disease.  Discussed faces of dementia.  During a discussion of the brief life review of the patient, Erica Mueller shares that patient has 2 children-1 daughter and 1 deceased son (overdose in [place] , [place] 3 weeks ago).  She shares daughter has not checked on the patient in over 5 years but is currently  seeking guardianship and does not want to sign over a POA.    I assured Erica Mueller that there is no urgent or emergent need to change anything to patient's plan of care.  However, determining patient's next of kin is significant in order to be able to continue goals of care discussions.    Erica Mueller shares she has no contact info for patient's daughter.  Conveyed above to attending and TOC.  TOC to follow-up to determine patient's next of kin.   Once definitive of next of kin is clear, PMT will continue discussion in regards to boundaries and goals of care for patient.  No change to plan of care at this time.  Primary Decision Maker NEXT OF KIN  Physical Exam Vitals reviewed.   Constitutional:      General: She is not in acute distress.    Appearance: She is normal weight.  HENT:     Head: Normocephalic.     Mouth/Throat:     Mouth: Mucous membranes are moist.  Eyes:     Pupils: Pupils are equal, round, and reactive to light.  Pulmonary:     Effort: Pulmonary effort is normal.  Skin:    General: Skin is warm and dry.  Neurological:     Mental Status: She is alert.     Comments: Nonverbal during my interaction  Psychiatric:        Mood and Affect: Mood normal.        Behavior: Behavior normal.     Palliative Assessment/Data:     Thank you for this consult. Palliative medicine will continue to follow and assist holistically.   Visit includes: Detailed review of medical records (labs, imaging, vital signs), medically appropriate exam (mental status, respiratory, cardiac, skin), discussed with treatment team, counseling and educating patient, family and staff, documenting clinical information, medication management and coordination of care.  Signed by: Lamarr Gunner, DNP, FNP-BC Palliative Medicine   Please contact Palliative Medicine Team providers via [facility] for questions and concerns.

## 2024-03-26 NOTE — Progress Notes (Signed)
 SLP Cancellation Note  Patient Details Name: MERSADES BARBARO MRN: [id number] DOB: Nov 30, 1950   Cancelled treatment:       Reason Eval/Treat Not Completed: Patient declined, no reason specified  Pt seen at bedside for assessment of advanced textures and consistencies. Per chart review, pt's last dysphagia assessment was 03/28/2021 when pt was undergoing ECT d/t catatonic state. Pt also had a PEG and was placed on puree with nectar thick liquids for pleasure during that hospitalization.   Further chart review reveals that pt no longer has PEG (uncertain of when it was removed). This Clinical research associate also reached out to [employer] (owner of [facility]). He states that pt has been consuming a regular diet (no salt, heart healthy) with thin liquids for an extended period of time.   During this writer's attempt, pt was awake, alert, confused, mumbled speech and refused trials of thin water . Support and cues provided but pt stated I don't want any. Pt not aggressive but continued to decline offer of thin liquids. Per pt's nurse, pt has been consuming 1005 of current puree diet with nectar thick liquids.   Will attempt at next available time.    Aimar Borghi B. Rubbie, M.S., CCC-SLP, CBIS Speech-Language Pathologist Certified Brain Injury Specialist [facility]  [facility] [phone] Ascom [phone] Fax [phone]  Ioannis Schuh Rubbie 03/26/2024, 2:26 PM

## 2024-03-26 NOTE — Plan of Care (Signed)
   Problem: Education: Goal: Knowledge of General Education information will improve Description: Including pain rating scale, medication(s)/side effects and non-pharmacologic comfort measures Outcome: Progressing   Problem: Clinical Measurements: Goal: Will remain free from infection Outcome: Progressing

## 2024-03-27 DIAGNOSIS — F02B11 Dementia in other diseases classified elsewhere, moderate, with agitation: Secondary | ICD-10-CM

## 2024-03-27 DIAGNOSIS — G309 Alzheimer's disease, unspecified: Secondary | ICD-10-CM

## 2024-03-27 DIAGNOSIS — Z515 Encounter for palliative care: Secondary | ICD-10-CM | POA: Diagnosis not present

## 2024-03-27 DIAGNOSIS — E162 Hypoglycemia, unspecified: Secondary | ICD-10-CM | POA: Diagnosis not present

## 2024-03-27 LAB — COMPREHENSIVE METABOLIC PANEL WITH GFR
ALT: 18 U/L (ref 0–44)
AST: 23 U/L (ref 15–41)
Albumin: 2.9 g/dL — ABNORMAL LOW (ref 3.5–5.0)
Alkaline Phosphatase: 40 U/L (ref 38–126)
Anion gap: 7 (ref 5–15)
BUN: 15 mg/dL (ref 8–23)
CO2: 25 mmol/L (ref 22–32)
Calcium: 8.5 mg/dL — ABNORMAL LOW (ref 8.9–10.3)
Chloride: 109 mmol/L (ref 98–111)
Creatinine, Ser: 0.83 mg/dL (ref 0.44–1.00)
GFR, Estimated: >60 mL/min (ref >60)
Glucose, Bld: 93 mg/dL (ref 70–99)
Potassium: 4 mmol/L (ref 3.5–5.1)
Sodium: 141 mmol/L (ref 135–145)
Total Bilirubin: 0.3 mg/dL (ref 0.0–1.2)
Total Protein: 6.3 g/dL — ABNORMAL LOW (ref 6.5–8.1)

## 2024-03-27 LAB — GLUCOSE, CAPILLARY
Glucose-Capillary: 104 mg/dL — ABNORMAL HIGH (ref 70–99)
Glucose-Capillary: 112 mg/dL — ABNORMAL HIGH (ref 70–99)
Glucose-Capillary: 168 mg/dL — ABNORMAL HIGH (ref 70–99)
Glucose-Capillary: 121 mg/dL — ABNORMAL HIGH (ref 70–99)

## 2024-03-27 LAB — CBC
HCT: 34.9 % — ABNORMAL LOW (ref 36.0–46.0)
Hemoglobin: 11.3 g/dL — ABNORMAL LOW (ref 12.0–15.0)
MCH: 29.9 pg (ref 26.0–34.0)
MCHC: 32.4 g/dL (ref 30.0–36.0)
MCV: 92.3 fL (ref 80.0–100.0)
Platelets: 154 K/uL (ref 150–400)
RBC: 3.78 MIL/uL — ABNORMAL LOW (ref 3.87–5.11)
RDW: 13.2 % (ref 11.5–15.5)
WBC: 4.3 K/uL (ref 4.0–10.5)
nRBC: 0 % (ref 0.0–0.2)

## 2024-03-27 LAB — MAGNESIUM: Magnesium: 1.9 mg/dL (ref 1.7–2.4)

## 2024-03-27 MED ORDER — ENSURE PLUS HIGH PROTEIN PO LIQD
237.0000 mL | Freq: Two times a day (BID) | ORAL | Status: DC
  Administered 2024-03-27: 237 mL via ORAL

## 2024-03-27 MED ORDER — ENSURE PLUS HIGH PROTEIN PO LIQD: 237.0000 mL | Freq: Two times a day (BID) | ORAL | Status: AC

## 2024-03-27 MED ORDER — THIOTHIXENE 2 MG PO CAPS: 2.0000 mg | ORAL_CAPSULE | Freq: Every day | ORAL | Status: AC

## 2024-03-27 MED ORDER — LISINOPRIL 10 MG PO TABS: 10.0000 mg | ORAL_TABLET | Freq: Every day | ORAL | Status: AC

## 2024-03-27 NOTE — TOC Initial Note (Signed)
 Transition of Care [facility]) - Initial/Assessment Note    Patient Details  Name: Erica Mueller MRN: [id number] Date of Birth: 1950/12/23  Transition of Care [facility]) CM/SW Contact:    Lauraine JAYSON Carpen, LCSW Phone Number: 03/27/2024, 11:16 AM  Clinical Narrative: Per chart review, patient is from [facility] on their memory care side. CSW called the owner and confirmed. Per MD, potential discharge today. Facility staff will transport.                  Expected Discharge Plan: Memory Care Barriers to Discharge: Continued Medical Work up   Patient Goals and CMS Choice            Expected Discharge Plan and Services     Post Acute Care Choice: Resumption of Svcs/PTA Provider Living arrangements for the past 2 months: Assisted Living Facility                                      Prior Living Arrangements/Services Living arrangements for the past 2 months: Assisted Living Facility Lives with:: Facility Resident Patient language and need for interpreter reviewed:: Yes        Need for Family Participation in Patient Care: Yes (Comment) Care giver support system in place?: Yes (comment)   Criminal Activity/Legal Involvement Pertinent to Current Situation/Hospitalization: No - Comment as needed  Activities of Daily Living   ADL Screening (condition at time of admission) Independently performs ADLs?: No  Permission Sought/Granted                  Emotional Assessment   Attitude/Demeanor/Rapport: Unable to Assess Affect (typically observed): Unable to Assess Orientation: :  (Disoriented x 4) Alcohol / Substance Use: Not Applicable Psych Involvement: No (comment)  Admission diagnosis:  Hypoglycemia [E16.2] Patient Active Problem List   Diagnosis Date Noted   Hypoglycemia 03/24/2024   Altered mental status    Feeding tube dysfunction    Aspiration pneumonia (HCC) 03/25/2021   Chronic combined systolic and diastolic CHF (congestive heart failure) (HCC)  03/25/2021   Anemia of chronic disease 03/25/2021   Aortic atherosclerosis 03/25/2021   Emphysema (HCC) 03/25/2021   Nonspecific complex cystic structure superficial soft tissues right axilla. 03/25/2021   Acute respiratory failure with hypoxia (HCC) 03/25/2021   Thrombocytopenia 03/25/2021   Ankle wound 03/23/2021   COVID-19 virus infection    AKI (acute kidney injury)    Catatonia 02/22/2021   Failure to thrive in adult    Protein-calorie malnutrition, severe 02/10/2021   Stage II pressure ulcer (HCC) 02/09/2021   Acute metabolic encephalopathy 11/07/2020   Severe sepsis (HCC) 11/07/2020   UTI (urinary tract infection) 11/07/2020   GERD (gastroesophageal reflux disease) 12/20/2017   Hyperlipemia 12/20/2017   History of schizophrenia 07/30/2017   Schizophrenia (HCC) 05/27/2017   Type 2 diabetes mellitus with hyperglycemia, without long-term current use of insulin  (HCC) 04/02/2017   Essential hypertension 04/02/2017   Noncompliance 04/02/2017   Depression 11/17/2013   PCP:  [facility] Pharmacy:   [facility] - [place], [place] - [address] [place] [address] Phone: [phone] Fax: [phone]  [id number] [place], [place] - [address] [facility] [address] [facility] [place] [address] Phone: [phone] Fax: [phone]  [facility] [place], [place] - [address] [place] [address] Phone: [phone] Fax: [phone]  [facility] - [place], [place] - [place] [address] [place] [address] Phone: [phone] Fax: [phone]     Social Drivers of Health (SDOH) Social History: SDOH Screenings   Food Insecurity: Patient Unable To Answer (03/25/2024)  Housing: Unknown (03/25/2024)  Transportation Needs: Patient Unable To Answer (03/25/2024)  Utilities: Patient Unable To Answer  (03/25/2024)  Alcohol Screen: Low Risk  (10/16/2017)  Physical Activity: Insufficiently Active (03/24/2019)  Social Connections: Patient Unable To Answer (03/25/2024)  Stress: No Stress Concern Present (03/24/2019)  Tobacco Use: Medium Risk (03/24/2024)   SDOH Interventions:     Readmission Risk Interventions     No data to display

## 2024-03-27 NOTE — TOC Transition Note (Signed)
 Transition of Care [facility]) - Discharge Note   Patient Details  Name: Erica Mueller MRN: [id number] Date of Birth: 02-12-1951  Transition [facility] Care [facility]) CM/SW Contact:  Lauraine JAYSON Carpen, LCSW Phone Number: 03/27/2024, 1:30 PM   Clinical Narrative:   Patient has orders to discharge back to [facility] on their memory care side today. Faxed FL2 and discharge summary to facility. Per facility owner, sister Erica has guardianship. CSW called Erica who stated she is working on it and they go back to court on 10/28. RN will call facility owner to coordinate a pickup time when patient is ready. No further concerns. CSW signing off.  Final next level of care: Memory Care Barriers to Discharge: No Barriers Identified   Patient Goals and CMS Choice            Discharge Placement                Patient to be transferred to facility by: Facility staff Name of family member notified: Erica Mueller Patient and family notified of of transfer: 03/27/24  Discharge Plan and Services Additional resources added to the After Visit Summary for       Post Acute Care Choice: Resumption of Svcs/PTA Provider                               Social Drivers of Health (SDOH) Interventions SDOH Screenings   Food Insecurity: Patient Unable To Answer (03/25/2024)  Housing: Unknown (03/25/2024)  Transportation Needs: Patient Unable To Answer (03/25/2024)  Utilities: Patient Unable To Answer (03/25/2024)  Alcohol Screen: Low Risk  (10/16/2017)  Physical Activity: Insufficiently Active (03/24/2019)  Social Connections: Patient Unable To Answer (03/25/2024)  Stress: No Stress Concern Present (03/24/2019)  Tobacco Use: Medium Risk (03/24/2024)     Readmission Risk Interventions     No data to display

## 2024-03-27 NOTE — Evaluation (Signed)
 Clinical/Bedside Swallow Evaluation Patient Details  Name: Erica Mueller MRN: [id number] Date of Birth: 1951/01/25  Today's Date: 03/27/2024 Time: SLP Start Time (ACUTE ONLY): 1001 SLP Stop Time (ACUTE ONLY): 1020 SLP Time Calculation (min) (ACUTE ONLY): 19 min  Past Medical History:  Past Medical History:  Diagnosis Date   Coronary artery disease    Dementia (HCC)    Depression    Diabetes mellitus without complication (HCC)    Elevated lipids    GERD (gastroesophageal reflux disease)    Hypertension    Schizophrenia (HCC)    Syphilis (acquired)    Past Surgical History:  Past Surgical History:  Procedure Laterality Date   COLONOSCOPY     COLONOSCOPY WITH PROPOFOL  N/A 09/01/2015   Procedure: COLONOSCOPY WITH PROPOFOL ;  Surgeon: Deward CINDERELLA Piedmont, MD;  Location: [facility] ENDOSCOPY;  Service: Gastroenterology;  Laterality: N/A;   IR FLUORO RM 30-60 MIN  02/23/2021   IR GASTROSTOMY TUBE MOD SED  02/24/2021   IR REPLC GASTRO/COLONIC TUBE PERCUT W/FLUORO  02/27/2021   TUBAL LIGATION     wisdom teeth removal     HPI:  73 y.o. female with medical history significant of DM2, dementia, HTN who presented from ALF due to unresponsiveness and found to have hypoglycemia to 39.      Per ED notes, pt came from [facility] after being found unresponsive while sitting on the couch. BG was 39 when EMS arrived. When pt arrived in the ED, she was confused, became combative and attempting to get out of bed, therefore was given IV ativan . Per facility med rec, pt was noted to be on glimepiride  and metformin  at the facility.    Assessment / Plan / Recommendation  Clinical Impression  Pt presents with increased alertness and attention to task. She presents with what appears to be a primarily oral phase dysphagia related to current cognitive abilities and lack of overall dentition. Pt seen with regular breakfast tray and thin liquids via straw. With assistance, pt able to engage in self feeding. She  consumed scrambled eggs with mildly increased lingual manipulation but good oral clearing. However, when consuming a piece of the bacon, she demonstrated prolonged mastication and was responsive to cues to spit the piece out. Skilled observation was provided of her consuming thin liquids via straw. No overt s/s of aspiration were observed.   At this time, recommend downgraded diet to dysphagia 2 with thin liquids via straw, medicine crushed in puree.   SLP Visit Diagnosis: Dysphagia, unspecified (R13.10)    Aspiration Risk  Mild aspiration risk    Diet Recommendation Dysphagia 2 (Fine chop);Thin liquid    Liquid Administration via: Straw Medication Administration: Crushed with puree Supervision: Staff to assist with self feeding;Full supervision/cueing for compensatory strategies Compensations: Minimize environmental distractions;Slow rate;Small sips/bites Postural Changes: Seated upright at 90 degrees;Remain upright for at least 30 minutes after po intake    Other  Recommendations Oral Care Recommendations: Oral care BID     Assistance Recommended at Discharge    Functional Status Assessment Patient has had a recent decline in their functional status and/or demonstrates limited ability to make significant improvements in function in a reasonable and predictable amount of time  Frequency and Duration   N/A         Prognosis Prognosis for improved oropharyngeal function: Guarded Barriers to Reach Goals: Cognitive deficits;Time post onset;Severity of deficits      Swallow Study   General Date of Onset: 03/24/24 HPI: 73 y.o. female  with medical history significant of DM2, dementia, HTN who presented from ALF due to unresponsiveness and found to have hypoglycemia to 39.      Per ED notes, pt came from [facility] after being found unresponsive while sitting on the couch. BG was 39 when EMS arrived. When pt arrived in the ED, she was confused, became combative and  attempting to get out of bed, therefore was given IV ativan . Per facility med rec, pt was noted to be on glimepiride  and metformin  at the facility. Type of Study: Bedside Swallow Evaluation Previous Swallow Assessment: 2022 Diet Prior to this Study: Regular;Thin liquids (Level 0) Temperature Spikes Noted: No Respiratory Status: Room air History of Recent Intubation: No Behavior/Cognition: Alert;Distractible;Requires cueing;Doesn't follow directions Oral Cavity Assessment: Dried secretions Oral Care Completed by SLP: Recent completion by staff Oral Cavity - Dentition: Poor condition;Missing dentition Vision: Functional for self-feeding Self-Feeding Abilities: Needs assist;Needs set up Patient Positioning: Upright in bed Baseline Vocal Quality:  (at baseline) Volitional Cough: Cognitively unable to elicit Volitional Swallow: Unable to elicit    Oral/Motor/Sensory Function Overall Oral Motor/Sensory Function:  (at baseline, no focal deficits observed with PO intake)   Ice Chips Ice chips: Not tested   Thin Liquid Thin Liquid: Within functional limits Presentation: Straw    Nectar Thick Nectar Thick Liquid: Not tested   Honey Thick Honey Thick Liquid: Not tested   Puree Puree: Within functional limits Presentation: Self Fed;Spoon   Solid     Solid: Impaired Presentation: Self Fed Oral Phase Impairments: Impaired mastication Oral Phase Functional Implications: Prolonged oral transit;Impaired mastication;Oral residue;Oral holding     Abrie Egloff B. Rubbie, M.S., CCC-SLP, Tree surgeon Certified Brain Injury Specialist [facility]  [facility] Office [phone] Ascom [phone] Fax [phone]

## 2024-03-27 NOTE — Plan of Care (Signed)
  Problem: Clinical Measurements: Goal: Will remain free from infection Outcome: Progressing   Problem: Nutrition: Goal: Adequate nutrition will be maintained Outcome: Progressing   Problem: Elimination: Goal: Will not experience complications related to bowel motility Outcome: Progressing

## 2024-03-27 NOTE — Discharge Summary (Addendum)
 Physician Discharge Summary  Patient: Erica Mueller FMW:[id number] DOB: Mar 31, 1951   Code Status: Full Code Admit date: 03/24/2024 Discharge date: 03/27/2024 Disposition: Long term care facility, PT and OT PCP: [facility]  Recommendations for Outpatient Follow-up:  Follow up with PCP within 1-2 weeks Regarding general hospital follow up and preventative care Recommend reviewing PO intake and serum glucose levels off oral diabetes medications Palliative consult  Discharge Diagnoses:  Principal Problem:   Hypoglycemia  Brief Hospital Course Summary: 73 y.o. female with medical history significant of DM2, dementia, HTN who presented from ALF due to unresponsiveness and found to have hypoglycemia to 39.  Home meds included glimepiride  and metformin .  Patient was treated with dextrose  IV fluids upon admission. Hgb A1c 5.7. Her home meds were held. With this supportive intervention, she had improvement in her clinical status back to what I believe to be her baseline. She is alert and tolerating PO diet today. Blood sugars have remained stable after discontinuation of the IV dextrose - today they have been 104, 112, 168. Will not restart her metformin  or glimepiride  at dc.  In addition, her BP was severely elevated on presentation and her home lisinopril  was increased to 10mg . BP have been reasonably well controlled in 130-140 systolics.   Her home metoprolol  was held for relative bradycardia. HR has been 66-83 today.    All other chronic conditions were treated with home medications.    Palliative was consulted on initial presentation for GOC discussion and guardianship NOK was researched but not a clear guardian found. She remains full code and full scope of care at this time. I recommend palliative following outpatient.  Discharge Condition: Good, improved Recommended discharge diet: dysphagia 2  Consultations: Palliative   Procedures/Studies: None   Discharge  Instructions     Discharge patient   Complete by: As directed    Discharge disposition: 03-Skilled Nursing Facility   Discharge patient date: 03/27/2024      Allergies as of 03/27/2024   No Known Allergies      Medication List     STOP taking these medications    ascorbic acid  500 MG tablet Commonly known as: VITAMIN C   benzonatate 100 MG capsule Commonly known as: TESSALON   cephALEXin  500 MG capsule Commonly known as: KEFLEX    docusate sodium  100 MG capsule Commonly known as: COLACE   glimepiride  4 MG tablet Commonly known as: AMARYL    metFORMIN  1000 MG tablet Commonly known as: GLUCOPHAGE    metoprolol  tartrate 25 MG tablet Commonly known as: LOPRESSOR    senna-docusate 8.6-50 MG tablet Commonly known as: Senokot-S   zinc  gluconate 50 MG tablet       TAKE these medications    acetaminophen  500 MG tablet Commonly known as: TYLENOL  Take 500 mg by mouth every 8 (eight) hours as needed for mild pain (pain score 1-3), fever or headache.   divalproex 125 MG capsule Commonly known as: DEPAKOTE SPRINKLE Take 125 mg by mouth 2 (two) times daily.   feeding supplement Liqd Take 237 mLs by mouth 2 (two) times daily between meals. What changed:  how much to take how to take this when to take this   free water  Soln Place 60 mLs into feeding tube every 4 (four) hours.   lisinopril  10 MG tablet Commonly known as: ZESTRIL  Take 1 tablet (10 mg total) by mouth daily. What changed:  medication strength how much to take how to take this   lovastatin  40 MG tablet Commonly known as: MEVACOR   Take 1 tablet (40 mg total) by mouth at bedtime.   polyethylene glycol 17 g packet Commonly known as: MIRALAX  / GLYCOLAX  Take 17 g by mouth daily.   QUEtiapine 50 MG tablet Commonly known as: SEROQUEL Take 50 mg by mouth 2 (two) times daily.   sertraline 50 MG tablet Commonly known as: ZOLOFT Take 50 mg by mouth daily.   thiothixene  1 MG capsule Commonly  known as: NAVANE  Take 1 mg by mouth daily.   thiothixene  2 MG capsule Commonly known as: NAVANE  Take 1 capsule (2 mg total) by mouth at bedtime.   Vitamin D  50 MCG (2000 UT) tablet Take 2,000 Units by mouth daily.         Subjective   Pt reports feeling well. Denies complaints. She is doing fine. She ate about half of her breakfast.   All questions and concerns were addressed at time of discharge.  Objective  Blood pressure (!) 145/42, pulse 83, temperature 98.1 F (36.7 C), temperature source Oral, resp. rate 17, height 5' 3 (1.6 m), weight 51 kg, SpO2 99%.   General: Pt is alert, awake, not in acute distress Cardiovascular: RRR, S1/S2 +, no rubs, no gallops Respiratory: CTA bilaterally, no wheezing, no rhonchi Abdominal: Soft, NT, ND, bowel sounds + Extremities: no edema, no cyanosis  The results of significant diagnostics from this hospitalization (including imaging, microbiology, ancillary and laboratory) are listed below for reference.   Imaging studies: CT Head Wo Contrast Result Date: 03/24/2024 EXAM: CT HEAD WITHOUT CONTRAST 03/24/2024 02:59:38 PM TECHNIQUE: CT of the head was performed without the administration of intravenous contrast. Automated exposure control, iterative reconstruction, and/or weight based adjustment of the mA/kV was utilized to reduce the radiation dose to as low as reasonably achievable. COMPARISON: Head CT 02/07/2021 and MRI 02/11/2021. CLINICAL HISTORY: Altered mental status and unresponsiveness. Hypoglycemia (CBG 39). History of DM2. FINDINGS: BRAIN AND VENTRICLES: There is no evidence of an acute infarct, intracranial hemorrhage, mass, midline shift, hydrocephalus, or extra-axial fluid collection. There is mild cerebral atrophy. Patchy cerebral white matter hypodensities are similar to the prior CT and nonspecific but compatible with mild to moderate chronic small vessel ischemic disease. Chronic lacunar infarcts are again noted in the left  corona radiata, left thalamus, and left basal ganglia. Calcified atherosclerosis at the skull base. ORBITS: No acute abnormality. SINUSES: No acute abnormality. SOFT TISSUES AND SKULL: No acute soft tissue abnormality. No skull fracture. IMPRESSION: 1. No acute intracranial abnormality. 2. Mild to moderate chronic small vessel ischemic disease. Electronically signed by: Dasie Hamburg MD 03/24/2024 03:12 PM EDT RP Workstation: [id number]   DG Chest 2 View Result Date: 03/10/2024 EXAM: 2 VIEW(S) XRAY OF THE CHEST 03/10/2024 08:14:00 PM COMPARISON: Comparison with 02/10/2021 right greater than left basilar atelectasis or infiltrates. CLINICAL HISTORY: weakness. Pt. Would not move arm out of way for lateral. Best images attainable. FINDINGS: LUNGS AND PLEURA: Right greater than left basilar atelectasis or infiltrates. No pulmonary edema. No pleural effusion. No pneumothorax. HEART AND MEDIASTINUM: Stable cardiomediastinal silhouette. BONES AND SOFT TISSUES: No acute osseous abnormality. IMPRESSION: 1. Right greater than left basilar atelectasis versus infiltrates. Electronically signed by: Norman Gatlin MD 03/10/2024 08:19 PM EDT RP Workstation: [id number]    Labs: Basic Metabolic Panel: Recent Labs  Lab 03/24/24 1216 03/26/24 0332 03/27/24 0438  NA 143 142 141  K 3.6 3.3* 4.0  CL 106 108 109  CO2 25 24 25   GLUCOSE 42* 123* 93  BUN 15 8 15   CREATININE 0.86 0.85 0.83  CALCIUM 9.5 8.4* 8.5*  MG  --   --  1.9   CBC: Recent Labs  Lab 03/24/24 1216 03/26/24 0332 03/27/24 0438  WBC 3.9* 3.6* 4.3  NEUTROABS 2.8  --   --   HGB 12.1 11.2* 11.3*  HCT 38.3 36.3 34.9*  MCV 93.2 94.8 92.3  PLT 196 168 154   Microbiology: Results for orders placed or performed during the hospital encounter of 02/07/21  Resp Panel by RT-PCR (Flu A&B, Covid) Nasopharyngeal Swab     Status: Abnormal   Collection Time: 02/07/21  3:16 PM   Specimen: Nasopharyngeal Swab; Nasopharyngeal(NP) swabs in vial transport  medium  Result Value Ref Range Status   SARS Coronavirus 2 by RT PCR POSITIVE (A) NEGATIVE Final    Comment: RESULT CALLED TO, READ BACK BY AND VERIFIED WITH: MATT BASSETT 02/07/21 1644 AMK (NOTE) SARS-CoV-2 target nucleic acids are DETECTED.  The SARS-CoV-2 RNA is generally detectable in upper respiratory specimens during the acute phase of infection. Positive results are indicative of the presence of the identified virus, but do not rule out bacterial infection or co-infection with other pathogens not detected by the test. Clinical correlation with patient history and other diagnostic information is necessary to determine patient infection status. The expected result is Negative.  Fact Sheet for Patients: BloggerCourse.com  Fact Sheet for Healthcare Providers: SeriousBroker.it  This test is not yet approved or cleared by the [place]  FDA and  has been authorized for detection and/or diagnosis of SARS-CoV-2 by FDA under an Emergency Use Authorization (EUA).  This EUA will remain in effect (meaning this test can be use d) for the duration of  the COVID-19 declaration under Section 564(b)(1) of the Act, 21 U.S.C. section 360bbb-3(b)(1), unless the authorization is terminated or revoked sooner.     Influenza A by PCR NEGATIVE NEGATIVE Final   Influenza B by PCR NEGATIVE NEGATIVE Final    Comment: (NOTE) The Xpert Xpress SARS-CoV-2/FLU/RSV plus assay is intended as an aid in the diagnosis of influenza from Nasopharyngeal swab specimens and should not be used as a sole basis for treatment. Nasal washings and aspirates are unacceptable for Xpert Xpress SARS-CoV-2/FLU/RSV testing.  Fact Sheet for Patients: BloggerCourse.com  Fact Sheet for Healthcare Providers: SeriousBroker.it  This test is not yet approved or cleared by the [place]  FDA and has been authorized for  detection and/or diagnosis of SARS-CoV-2 by FDA under an Emergency Use Authorization (EUA). This EUA will remain in effect (meaning this test can be used) for the duration of the COVID-19 declaration under Section 564(b)(1) of the Act, 21 U.S.C. section 360bbb-3(b)(1), unless the authorization is terminated or revoked.  Performed at [facility], [address] [place], [place] [address]   Blood culture (routine single)     Status: None   Collection Time: 02/07/21  3:37 PM   Specimen: BLOOD  Result Value Ref Range Status   Specimen Description BLOOD RIGHT ANTECUBITAL  Final   Special Requests   Final    BOTTLES DRAWN AEROBIC AND ANAEROBIC Blood Culture adequate volume   Culture   Final    NO GROWTH 7 DAYS Performed at [facility], [address] [place], [place] [address]    Report Status 02/14/2021 FINAL  Final  Culture, blood (single)     Status: None   Collection Time: 02/07/21  3:50 PM   Specimen: BLOOD  Result Value Ref Range Status   Specimen Description BLOOD LEFT ASSIST CONTROL  Final  Special Requests   Final    BOTTLES DRAWN AEROBIC AND ANAEROBIC Blood Culture adequate volume   Culture   Final    NO GROWTH 7 DAYS Performed at [facility], [address] [place], [place] [address]    Report Status 02/14/2021 FINAL  Final  Urine Culture     Status: None   Collection Time: 02/07/21  4:50 PM   Specimen: In/Out Cath Urine  Result Value Ref Range Status   Specimen Description   Final    IN/OUT CATH URINE Performed at [facility], [address] [place], [place] [address]    Special Requests   Final    NONE Performed at [facility], [address], [place], [place] [address]    Culture   Final    NO GROWTH Performed at [facility], [address] [place], [place] [address]    Report Status 02/09/2021 FINAL  Final  MRSA Next Gen by PCR, Nasal     Status: None   Collection Time: 02/09/21  9:47 AM    Specimen: Nasal Mucosa; Nasal Swab  Result Value Ref Range Status   MRSA by PCR Next Gen NOT DETECTED NOT DETECTED Final    Comment: (NOTE) The GeneXpert MRSA Assay (FDA approved for NASAL specimens only), is one component of a comprehensive MRSA colonization surveillance program. It is not intended to diagnose MRSA infection nor to guide or monitor treatment for MRSA infections. Test performance is not FDA approved in patients less than 76 years old. Performed at [facility], [address] [place], [place] [address]   Resp Panel by RT-PCR (Flu A&B, Covid) Nasopharyngeal Swab     Status: None   Collection Time: 03/16/21  2:40 PM   Specimen: Nasopharyngeal Swab; Nasopharyngeal(NP) swabs in vial transport medium  Result Value Ref Range Status   SARS Coronavirus 2 by RT PCR NEGATIVE NEGATIVE Final    Comment: (NOTE) SARS-CoV-2 target nucleic acids are NOT DETECTED.  The SARS-CoV-2 RNA is generally detectable in upper respiratory specimens during the acute phase of infection. The lowest concentration of SARS-CoV-2 viral copies this assay can detect is 138 copies/mL. A negative result does not preclude SARS-Cov-2 infection and should not be used as the sole basis for treatment or other patient management decisions. A negative result may occur with  improper specimen collection/handling, submission of specimen other than nasopharyngeal swab, presence of viral mutation(s) within the areas targeted by this assay, and inadequate number of viral copies(<138 copies/mL). A negative result must be combined with clinical observations, patient history, and epidemiological information. The expected result is Negative.  Fact Sheet for Patients:  BloggerCourse.com  Fact Sheet for Healthcare Providers:  SeriousBroker.it  This test is no t yet approved or cleared by the [place]  FDA and  has been authorized for detection and/or  diagnosis of SARS-CoV-2 by FDA under an Emergency Use Authorization (EUA). This EUA will remain  in effect (meaning this test can be used) for the duration of the COVID-19 declaration under Section 564(b)(1) of the Act, 21 U.S.C.section 360bbb-3(b)(1), unless the authorization is terminated  or revoked sooner.       Influenza A by PCR NEGATIVE NEGATIVE Final   Influenza B by PCR NEGATIVE NEGATIVE Final    Comment: (NOTE) The Xpert Xpress SARS-CoV-2/FLU/RSV plus assay is intended as an aid in the diagnosis of influenza from Nasopharyngeal swab specimens and should not be used as a sole basis for treatment. Nasal washings and  aspirates are unacceptable for Xpert Xpress SARS-CoV-2/FLU/RSV testing.  Fact Sheet for Patients: BloggerCourse.com  Fact Sheet for Healthcare Providers: SeriousBroker.it  This test is not yet approved or cleared by the [place]  FDA and has been authorized for detection and/or diagnosis of SARS-CoV-2 by FDA under an Emergency Use Authorization (EUA). This EUA will remain in effect (meaning this test can be used) for the duration of the COVID-19 declaration under Section 564(b)(1) of the Act, 21 U.S.C. section 360bbb-3(b)(1), unless the authorization is terminated or revoked.  Performed at [facility], [address] [place], [place] [address]   Resp Panel by RT-PCR (Flu A&B, Covid) Nasopharyngeal Swab     Status: None   Collection Time: 03/27/21  8:20 PM   Specimen: Nasopharyngeal Swab; Nasopharyngeal(NP) swabs in vial transport medium  Result Value Ref Range Status   SARS Coronavirus 2 by RT PCR NEGATIVE NEGATIVE Final    Comment: (NOTE) SARS-CoV-2 target nucleic acids are NOT DETECTED.  The SARS-CoV-2 RNA is generally detectable in upper respiratory specimens during the acute phase of infection. The lowest concentration of SARS-CoV-2 viral copies this assay can detect is 138  copies/mL. A negative result does not preclude SARS-Cov-2 infection and should not be used as the sole basis for treatment or other patient management decisions. A negative result may occur with  improper specimen collection/handling, submission of specimen other than nasopharyngeal swab, presence of viral mutation(s) within the areas targeted by this assay, and inadequate number of viral copies(<138 copies/mL). A negative result must be combined with clinical observations, patient history, and epidemiological information. The expected result is Negative.  Fact Sheet for Patients:  BloggerCourse.com  Fact Sheet for Healthcare Providers:  SeriousBroker.it  This test is no t yet approved or cleared by the [place]  FDA and  has been authorized for detection and/or diagnosis of SARS-CoV-2 by FDA under an Emergency Use Authorization (EUA). This EUA will remain  in effect (meaning this test can be used) for the duration of the COVID-19 declaration under Section 564(b)(1) of the Act, 21 U.S.C.section 360bbb-3(b)(1), unless the authorization is terminated  or revoked sooner.       Influenza A by PCR NEGATIVE NEGATIVE Final   Influenza B by PCR NEGATIVE NEGATIVE Final    Comment: (NOTE) The Xpert Xpress SARS-CoV-2/FLU/RSV plus assay is intended as an aid in the diagnosis of influenza from Nasopharyngeal swab specimens and should not be used as a sole basis for treatment. Nasal washings and aspirates are unacceptable for Xpert Xpress SARS-CoV-2/FLU/RSV testing.  Fact Sheet for Patients: BloggerCourse.com  Fact Sheet for Healthcare Providers: SeriousBroker.it  This test is not yet approved or cleared by the [place]  FDA and has been authorized for detection and/or diagnosis of SARS-CoV-2 by FDA under an Emergency Use Authorization (EUA). This EUA will remain in effect (meaning  this test can be used) for the duration of the COVID-19 declaration under Section 564(b)(1) of the Act, 21 U.S.C. section 360bbb-3(b)(1), unless the authorization is terminated or revoked.  Performed at [facility], [address], [place], [place] [address]     Time coordinating discharge: Over 30 minutes  Marien LITTIE Piety, MD  [facility] 03/27/2024, 1:57 PM

## 2024-03-27 NOTE — Plan of Care (Signed)
  Problem: Education: Goal: Knowledge of General Education information will improve Description: Including pain rating scale, medication(s)/side effects and non-pharmacologic comfort measures Outcome: Adequate for Discharge   Problem: Health Behavior/Discharge Planning: Goal: Ability to manage health-related needs will improve Outcome: Adequate for Discharge   Problem: Clinical Measurements: Goal: Ability to maintain clinical measurements within normal limits will improve Outcome: Adequate for Discharge Goal: Will remain free from infection 03/27/2024 1344 by Joshua Andrez PARAS, LPN Outcome: Adequate for Discharge 03/27/2024 0735 by Joshua Andrez PARAS, LPN Outcome: Progressing Goal: Diagnostic test results will improve Outcome: Adequate for Discharge Goal: Respiratory complications will improve Outcome: Adequate for Discharge Goal: Cardiovascular complication will be avoided Outcome: Adequate for Discharge   Problem: Activity: Goal: Risk for activity intolerance will decrease Outcome: Adequate for Discharge   Problem: Nutrition: Goal: Adequate nutrition will be maintained 03/27/2024 1344 by Joshua Andrez PARAS, LPN Outcome: Adequate for Discharge 03/27/2024 0735 by Joshua Andrez PARAS, LPN Outcome: Progressing   Problem: Coping: Goal: Level of anxiety will decrease Outcome: Adequate for Discharge   Problem: Elimination: Goal: Will not experience complications related to bowel motility 03/27/2024 1344 by Joshua Andrez PARAS, LPN Outcome: Adequate for Discharge 03/27/2024 0735 by Joshua Andrez PARAS, LPN Outcome: Progressing Goal: Will not experience complications related to urinary retention Outcome: Adequate for Discharge   Problem: Pain Managment: Goal: General experience of comfort will improve and/or be controlled Outcome: Adequate for Discharge   Problem: Safety: Goal: Ability to remain free from injury will improve Outcome: Adequate for Discharge   Problem: Skin Integrity: Goal:  Risk for impaired skin integrity will decrease Outcome: Adequate for Discharge

## 2024-03-27 NOTE — [place] FL2 (Signed)
 [place]  MEDICAID [id number] LEVEL OF CARE FORM     IDENTIFICATION  Patient Name: Erica Mueller Birthdate: 03/27/1951 Sex: female Admission Date (Current Location): 03/24/2024  [place] Number:  Chiropodist and Address:  [facility], [address], [place], [place] [address]      Provider Number: [id number]  Attending Physician Name and Address:  Lenon Marien CROME, MD  Relative Name and Phone Number:       Current Level of Care: Hospital Recommended Level of Care: Memory Care Prior Approval Number:    Date Approved/Denied:   PASRR Number:    Discharge Plan: Other (Comment) (Memory Care)    Current Diagnoses: Patient Active Problem List   Diagnosis Date Noted   Hypoglycemia 03/24/2024   Altered mental status    Feeding tube dysfunction    Aspiration pneumonia (HCC) 03/25/2021   Chronic combined systolic and diastolic CHF (congestive heart failure) (HCC) 03/25/2021   Anemia of chronic disease 03/25/2021   Aortic atherosclerosis 03/25/2021   Emphysema (HCC) 03/25/2021   Nonspecific complex cystic structure superficial soft tissues right axilla. 03/25/2021   Acute respiratory failure with hypoxia (HCC) 03/25/2021   Thrombocytopenia 03/25/2021   Ankle wound 03/23/2021   COVID-19 virus infection    AKI (acute kidney injury)    Catatonia 02/22/2021   Failure to thrive in adult    Protein-calorie malnutrition, severe 02/10/2021   Stage II pressure ulcer (HCC) 02/09/2021   Acute metabolic encephalopathy 11/07/2020   Severe sepsis (HCC) 11/07/2020   UTI (urinary tract infection) 11/07/2020   GERD (gastroesophageal reflux disease) 12/20/2017   Hyperlipemia 12/20/2017   History of schizophrenia 07/30/2017   Schizophrenia (HCC) 05/27/2017   Type 2 diabetes mellitus with hyperglycemia, without long-term current use of insulin  (HCC) 04/02/2017   Essential hypertension 04/02/2017   Noncompliance 04/02/2017   Depression 11/17/2013     Orientation RESPIRATION BLADDER Height & Weight      (Disoriented x 4)  Normal Incontinent Weight: 112 lb 7 oz (51 kg) Height:  5' 3 (160 cm)  BEHAVIORAL SYMPTOMS/MOOD NEUROLOGICAL BOWEL NUTRITION STATUS   (None)  (None) Continent Diet (DYS 2)  AMBULATORY STATUS COMMUNICATION OF NEEDS Skin     Verbally Normal                       Personal Care Assistance Level of Assistance              Functional Limitations Info  Sight, Hearing, Speech Sight Info: Adequate Hearing Info: Adequate Speech Info: Adequate    SPECIAL CARE FACTORS FREQUENCY                       Contractures Contractures Info: Present    Additional Factors Info  Code Status, Allergies Code Status Info: Full code Allergies Info: NKDA           Current Medications (03/27/2024):  This is the current hospital active medication list Current Facility-Administered Medications  Medication Dose Route Frequency Provider Last Rate Last Admin   acetaminophen  (TYLENOL ) tablet 1,000 mg  1,000 mg Oral TID PRN Awanda City, MD       enoxaparin  (LOVENOX ) injection 40 mg  40 mg Subcutaneous Q24H Awanda City, MD   40 mg at 03/26/24 2255   feeding supplement (ENSURE PLUS HIGH PROTEIN) liquid 237 mL  237 mL Oral BID BM Lenon Marien CROME, MD   237 mL at 03/27/24 0900   haloperidol  lactate (  HALDOL ) injection 2 mg  2 mg Intravenous Q6H PRN Awanda City, MD   2 mg at 03/25/24 0536   lisinopril  (ZESTRIL ) tablet 10 mg  10 mg Oral Daily Awanda City, MD   10 mg at 03/27/24 0900   ondansetron  (ZOFRAN ) injection 4 mg  4 mg Intravenous Q6H PRN Awanda City, MD       QUEtiapine (SEROQUEL) tablet 50 mg  50 mg Oral BID Cindy Garnette POUR, MD   50 mg at 03/27/24 0900   sertraline (ZOLOFT) tablet 50 mg  50 mg Oral Daily Cindy Garnette POUR, MD   50 mg at 03/27/24 0900   thiothixene  (NAVANE ) capsule 1 mg  1 mg Oral Daily Awanda City, MD   1 mg at 03/27/24 0900   thiothixene  (NAVANE ) capsule 2 mg  2 mg Oral QHS Awanda City, MD   2 mg at  03/26/24 2258     Discharge Medications: STOP taking these medications     ascorbic acid  500 MG tablet Commonly known as: VITAMIN C    benzonatate 100 MG capsule Commonly known as: TESSALON    cephALEXin  500 MG capsule Commonly known as: KEFLEX     docusate sodium  100 MG capsule Commonly known as: COLACE    glimepiride  4 MG tablet Commonly known as: AMARYL     metFORMIN  1000 MG tablet Commonly known as: GLUCOPHAGE     metoprolol  tartrate 25 MG tablet Commonly known as: LOPRESSOR     senna-docusate 8.6-50 MG tablet Commonly known as: Senokot-S    zinc  gluconate 50 MG tablet           TAKE these medications     acetaminophen  500 MG tablet Commonly known as: TYLENOL  Take 500 mg by mouth every 8 (eight) hours as needed for mild pain (pain score 1-3), fever or headache.    divalproex 125 MG capsule Commonly known as: DEPAKOTE SPRINKLE Take 125 mg by mouth 2 (two) times daily.    feeding supplement (OSMOLITE 1.2 CAL) Liqd Place 1,000 mLs into feeding tube continuous.    free water  Soln Place 60 mLs into feeding tube every 4 (four) hours.    lisinopril  10 MG tablet Commonly known as: ZESTRIL  Take 1 tablet (10 mg total) by mouth daily. What changed:  medication strength how much to take how to take this    lovastatin  40 MG tablet Commonly known as: MEVACOR  Take 1 tablet (40 mg total) by mouth at bedtime.    polyethylene glycol 17 g packet Commonly known as: MIRALAX  / GLYCOLAX  Take 17 g by mouth daily.    QUEtiapine 50 MG tablet Commonly known as: SEROQUEL Take 50 mg by mouth 2 (two) times daily.    sertraline 50 MG tablet Commonly known as: ZOLOFT Take 50 mg by mouth daily.    thiothixene  1 MG capsule Commonly known as: NAVANE  Take 1 mg by mouth daily.    thiothixene  2 MG capsule Commonly known as: NAVANE  Take 1 capsule (2 mg total) by mouth at bedtime.    Vitamin D  50 MCG (2000 UT) tablet Take 2,000 Units by mouth daily.   Relevant Imaging  Results:  Relevant Lab Results:   Additional Information SS#: [id number]  Lauraine JAYSON Carpen, LCSW

## 2024-03-27 NOTE — Progress Notes (Signed)
 Palliative Care Progress Note, Assessment & Plan   Patient Name: Erica Mueller       Date: 03/27/2024 DOB: Apr 19, 1951  Age: 73 y.o. MRN#: [id number] Attending Physician: Lenon Marien CROME, MD Primary Care Physician: [facility] Admit Date: 03/24/2024  Subjective: Patient is sitting up in bed, awake and alert.  She acknowledges my presence and is able to make her wishes known.  No family or friends present at bedside during my visit.  HPI: 73 y.o. female  with past medical history of dementia, schizophrenia, type 2 diabetes, HTN, former smoker, CAD, Chronic diastoc HFrEF, moderate MR, anemia, GERD, history of syphilis, depression with history of ECT treatments, and recent ED visit (2 weeks ago - weakness, unable to feed/care for self) admitted [facility] on 03/24/2024 with unresponsiveness and hypoglycemia (CBG of 39).   Upon arrival to the ED, patient was confused, combative, and attempted to get out of the bed.  IV Ativan  given at that time.   Patient is being treated for hypoglycemia due to [place] urea use (both glimepiride  and metformin  taken PTA), history of diabetes type 2 (A1c is 5.7), HTN urgency, acute metabolic encephalopathy with baseline dementia.   PMT was consulted to support patient and family goals of care discussions.  Summary of counseling/coordination of care: Extensive chart review completed prior to meeting patient including labs, vital signs, imaging, progress notes, orders, and available advanced directive documents from current and previous encounters.   After reviewing the patient's chart and assessing the patient at bedside, I spoke with patient in regards to symptom management and goals of care.   Symptoms assessed.  Patient has no acute complaints such  as headache, chest pain, N/B/D, or other acute issues at this time.  No adjustment to [facility] needed.  Attempted to gauge patient's understanding of her current medical situation.  She shares she feels fine.  When asked if she could not recall why she is in the hospital, she continued to repeat I am just fine.  Breakfast tray at bedside has maybe 2 bites eaten.  Asked patient if she had breakfast and she said she ate a whole lot.  Discussed importance of nutrition and p.o. intake to support patient's overall functional abilities and mentation.  She endorsed understanding but declined any further p.o. intake at this time.  Patient remains unable to participate in goals of care or medical decision making independently at this time.  After visiting with the patient, I counseled with TOC in regards to patient's next of kin.  As per TOC's advice, patient's daughter remains the legal surrogate decision maker at this time.  I attempted to speak with patient's daughter over the phone.  No answer.  Unable to leave a voicemail.  Discharge summary is in place.  Plan is for patient to return to [facility].  Unable to continue goals of care discussions with next of kin during this hospitalization.  Physical Exam Vitals reviewed.  Constitutional:      General: She is not in acute distress.    Appearance: She is normal weight.  HENT:     Mouth/Throat:     Mouth: Mucous membranes are moist.  Eyes:  Pupils: Pupils are equal, round, and reactive to light.  Cardiovascular:     Rate and Rhythm: Normal rate.  Pulmonary:     Effort: Pulmonary effort is normal.  Abdominal:     Palpations: Abdomen is soft.  Skin:    General: Skin is warm and dry.  Neurological:     Mental Status: She is alert.     Comments: Oriented to self  Psychiatric:        Mood and Affect: Mood normal.        Behavior: Behavior normal.             Visit includes: Detailed review of medical records (labs,  imaging, vital signs), medically appropriate exam (mental status, respiratory, cardiac, skin), discussed with treatment team, counseling and educating patient, family and staff, documenting clinical information, medication management and coordination of care.  Lamarr L. Arvid, DNP, FNP-BC Palliative Medicine Team

## 2024-04-10 ENCOUNTER — Emergency Department

## 2024-04-10 ENCOUNTER — Emergency Department
Admission: EM | Admit: 2024-04-10 | Discharge: 2024-04-10 | Disposition: A | Discharge status: Home / Self Care | Attending: Emergency Medicine | Admitting: Emergency Medicine

## 2024-04-10 ENCOUNTER — Other Ambulatory Visit: Payer: Self-pay

## 2024-04-10 DIAGNOSIS — F0390 Unspecified dementia without behavioral disturbance: Secondary | ICD-10-CM | POA: Insufficient documentation

## 2024-04-10 DIAGNOSIS — E119 Type 2 diabetes mellitus without complications: Secondary | ICD-10-CM | POA: Diagnosis not present

## 2024-04-10 DIAGNOSIS — I2510 Atherosclerotic heart disease of native coronary artery without angina pectoris: Secondary | ICD-10-CM | POA: Diagnosis not present

## 2024-04-10 DIAGNOSIS — I10 Essential (primary) hypertension: Secondary | ICD-10-CM | POA: Insufficient documentation

## 2024-04-10 DIAGNOSIS — K5900 Constipation, unspecified: Secondary | ICD-10-CM | POA: Diagnosis present

## 2024-04-10 DIAGNOSIS — R34 Anuria and oliguria: Secondary | ICD-10-CM

## 2024-04-10 DIAGNOSIS — R3912 Poor urinary stream: Secondary | ICD-10-CM | POA: Diagnosis not present

## 2024-04-10 LAB — CBC WITH DIFFERENTIAL/PLATELET
Abs Immature Granulocytes: 0.01 K/uL (ref 0.00–0.07)
Basophils Absolute: 0 K/uL (ref 0.0–0.1)
Basophils Relative: 0 %
Eosinophils Absolute: 0.1 K/uL (ref 0.0–0.5)
Eosinophils Relative: 1 %
HCT: 38.8 % (ref 36.0–46.0)
Hemoglobin: 12 g/dL (ref 12.0–15.0)
Immature Granulocytes: 0 %
Lymphocytes Relative: 26 %
Lymphs Abs: 1.2 K/uL (ref 0.7–4.0)
MCH: 29.6 pg (ref 26.0–34.0)
MCHC: 30.9 g/dL (ref 30.0–36.0)
MCV: 95.6 fL (ref 80.0–100.0)
Monocytes Absolute: 0.2 K/uL (ref 0.1–1.0)
Monocytes Relative: 4 %
Neutro Abs: 3.1 K/uL (ref 1.7–7.7)
Neutrophils Relative %: 69 %
Platelets: 186 K/uL (ref 150–400)
RBC: 4.06 MIL/uL (ref 3.87–5.11)
RDW: 13.1 % (ref 11.5–15.5)
WBC: 4.5 K/uL (ref 4.0–10.5)
nRBC: 0 % (ref 0.0–0.2)

## 2024-04-10 LAB — COMPREHENSIVE METABOLIC PANEL WITH GFR
ALT: 20 U/L (ref 0–44)
AST: 24 U/L (ref 15–41)
Albumin: 3.6 g/dL (ref 3.5–5.0)
Alkaline Phosphatase: 45 U/L (ref 38–126)
Anion gap: 10 (ref 5–15)
BUN: 21 mg/dL (ref 8–23)
CO2: 28 mmol/L (ref 22–32)
Calcium: 9.3 mg/dL (ref 8.9–10.3)
Chloride: 106 mmol/L (ref 98–111)
Creatinine, Ser: 1 mg/dL (ref 0.44–1.00)
GFR, Estimated: 59 mL/min — ABNORMAL LOW (ref >60)
Glucose, Bld: 149 mg/dL — ABNORMAL HIGH (ref 70–99)
Potassium: 3.7 mmol/L (ref 3.5–5.1)
Sodium: 144 mmol/L (ref 135–145)
Total Bilirubin: 0.4 mg/dL (ref 0.0–1.2)
Total Protein: 8.2 g/dL — ABNORMAL HIGH (ref 6.5–8.1)

## 2024-04-10 LAB — URINALYSIS, ROUTINE W REFLEX MICROSCOPIC
Bilirubin Urine: NEGATIVE
Glucose, UA: 50 mg/dL — AB
Hgb urine dipstick: NEGATIVE
Ketones, ur: NEGATIVE mg/dL
Leukocytes,Ua: NEGATIVE
Nitrite: NEGATIVE
Protein, ur: NEGATIVE mg/dL
Specific Gravity, Urine: 1.026 (ref 1.005–1.030)
pH: 5 (ref 5.0–8.0)

## 2024-04-10 MED ORDER — OLANZAPINE 10 MG IM SOLR
5.0000 mg | Freq: Once | INTRAMUSCULAR | Status: AC
  Administered 2024-04-10: 5 mg via INTRAMUSCULAR
  Filled 2024-04-10: qty 10

## 2024-04-10 NOTE — ED Notes (Signed)
 This RN called Erica Mueller x3 for report upon Pts discharge. No voicemail option available. [phone]

## 2024-04-10 NOTE — ED Notes (Signed)
 Pt provided pericare by this RN after having a episode of urine incontinence in brief. Pt tolerated well.

## 2024-04-10 NOTE — ED Notes (Signed)
 [facility]  called for  transport [facility]

## 2024-04-10 NOTE — ED Notes (Signed)
 Patient placed in stretcher with bed alarm for safety

## 2024-04-10 NOTE — ED Notes (Signed)
 RN assumed care of pt. Pt is sleeping. Rise and fall of chest noted.

## 2024-04-10 NOTE — ED Provider Notes (Signed)
 [facility] Provider Note   Event Date/Time   First MD Initiated Contact with Patient 04/10/24 1457     (approximate) History  Constipation  HPI Erica Mueller is a 73 y.o. female with a past medical history of schizophrenia, dementia, type 2 diabetes, coronary artery disease, hypertension, and depression/anxiety who presents from [facility] with complaints of constipation and no urine output for the last 2 days.  Patient is unable to provide history at this time or give any pertinent positives or negatives on review of systems. ROS: Unable to assess   Physical Exam  Triage Vital Signs: ED Triage Vitals [04/10/24 1213]  Encounter Vitals Group     BP (!) 140/69     Girls Systolic BP Percentile      Girls Diastolic BP Percentile      Boys Systolic BP Percentile      Boys Diastolic BP Percentile      Pulse Rate 86     Resp 18     Temp 99 F (37.2 C)     Temp Source Oral     SpO2 98 %     Weight      Height      Head Circumference      Peak Flow      Pain Score 0     Pain Loc      Pain Education      Exclude from Growth Chart    Most recent vital signs: Vitals:   04/10/24 1437 04/10/24 1438  BP:  (!) 146/55  Pulse:  (!) 59  Resp:  18  Temp:  97.9 F (36.6 C)  SpO2: 100% 97%   General: Awake, cooperative CV:  Good peripheral perfusion. Resp:  Normal effort. Abd:  No distention.  Nontender to palpation Other:  Elderly well-developed, well-nourished African-American female resting comfortably in no acute distress ED Results / Procedures / Treatments  Labs (all labs ordered are listed, but only abnormal results are displayed) Labs Reviewed  COMPREHENSIVE METABOLIC PANEL WITH GFR - Abnormal; Notable for the following components:      Result Value   Glucose, Bld 149 (*)    Total Protein 8.2 (*)    GFR, Estimated 59 (*)    All other components within normal limits  URINALYSIS, ROUTINE W REFLEX MICROSCOPIC - Abnormal; Notable  for the following components:   Color, Urine YELLOW (*)    APPearance CLEAR (*)    Glucose, UA 50 (*)    All other components within normal limits  CBC WITH DIFFERENTIAL/PLATELET   RADIOLOGY ED MD interpretation: Single view x-ray of the abdomen shows mild colonic stool burden - All radiology independently interpreted and agree with radiology assessment Official radiology report(s): DG Abdomen 1 View Result Date: 04/10/2024 EXAM: 1 VIEW XRAY OF THE ABDOMEN 04/10/2024 02:48:12 PM COMPARISON: 03/28/2021 CLINICAL HISTORY: FINDINGS: BOWEL: Nonobstructive bowel gas pattern. Mild amount of stool was seen throughout the colon. SOFT TISSUES: No opaque urinary calculi. Phleboliths are noted in the pelvis. BONES: No acute osseous abnormality. IMPRESSION: 1. Mild colonic stool burden. Electronically signed by: Lynwood Seip MD 04/10/2024 03:35 PM EDT RP Workstation: [id number]   PROCEDURES: Critical Care performed: No Procedures MEDICATIONS ORDERED IN ED: Medications  OLANZapine  (ZYPREXA ) injection 5 mg (5 mg Intramuscular Given 04/10/24 1547)   IMPRESSION / MDM / ASSESSMENT AND PLAN / ED COURSE  I reviewed the triage vital signs and the nursing notes.  The patient is on the cardiac monitor to evaluate for evidence of arrhythmia and/or significant heart rate changes. Patient's presentation is most consistent with acute presentation with potential threat to life or bodily function. Patient is a 73 year old female with the above-stated past medical history that presents for 2 days of constipation and decreased urine output.  Patient can cannot provide any history review of systems at this time.  This appears to be baseline for the patient. DDx: Constipation, UTI, urosepsis, kidney stone Plan: Single view abdominal x-ray, CMP, CBC, UA  X-ray shows mild constipation, patient's urine shows no evidence of urinary tract infection.  Patient did have a wet diaper on prior to an  In-N-Out catheterization and therefore patient is passing urine appropriately.  Patient is not show any evidence of kidney dysfunction.  Patient was given a bowel regimen using the home MiraLAX  that she is already taking.  Instructions given for her nursing home to administer this MiraLAX  appropriately.  Dispo: Discharged to nursing home care and PCP follow-up as needed   FINAL CLINICAL IMPRESSION(S) / ED DIAGNOSES   Final diagnoses:  Constipation, unspecified constipation type  Decreased urination   Rx / DC Orders   ED Discharge Orders     None      Note:  This document was prepared using Dragon voice recognition software and may include unintentional dictation errors.   Chandria Rookstool K, MD 04/10/24 [phone]

## 2024-04-10 NOTE — ED Notes (Signed)
Bladder scan 

## 2024-04-10 NOTE — Discharge Instructions (Addendum)
 You are already taking MiraLAX  (polyethylene glycol) 1 packet (17 g) by mouth daily.  You may increase this dosage to twice daily as well as double the dosage if constipation persists.  I would recommend doing a double dose of MiraLAX  (34 g) tomorrow morning.  If patient still does not have a bowel movement, please use MiraLAX  (34 g) twice a day and increase using this regimen until she has a bowel movement.  Please make sure you are staying as well-hydrated as possible

## 2024-04-10 NOTE — ED Notes (Signed)
 This RN gave report to [facility] [phone]. This RN talked to pt guardian JO and gave pt update. [facility] called for transport

## 2024-04-10 NOTE — ED Triage Notes (Signed)
 Patient to ED via [facility] from [facility] for constipation and no urine output x 2 days; denies abdominal pain.   BP 147/55 O2 96% HR 81

## 2024-04-24 ENCOUNTER — Emergency Department
Admission: EM | Admit: 2024-04-24 | Discharge: 2024-04-24 | Disposition: A | Discharge status: Skilled Nursing Facility | Attending: Emergency Medicine | Admitting: Emergency Medicine

## 2024-04-24 ENCOUNTER — Emergency Department

## 2024-04-24 ENCOUNTER — Other Ambulatory Visit: Payer: Self-pay

## 2024-04-24 DIAGNOSIS — F0390 Unspecified dementia without behavioral disturbance: Secondary | ICD-10-CM | POA: Diagnosis not present

## 2024-04-24 DIAGNOSIS — W19XXXA Unspecified fall, initial encounter: Secondary | ICD-10-CM

## 2024-04-24 DIAGNOSIS — M25552 Pain in left hip: Secondary | ICD-10-CM | POA: Diagnosis not present

## 2024-04-24 DIAGNOSIS — M25551 Pain in right hip: Secondary | ICD-10-CM | POA: Insufficient documentation

## 2024-04-24 DIAGNOSIS — E119 Type 2 diabetes mellitus without complications: Secondary | ICD-10-CM | POA: Insufficient documentation

## 2024-04-24 DIAGNOSIS — Y92122 Bedroom in nursing home as the place of occurrence of the external cause: Secondary | ICD-10-CM | POA: Insufficient documentation

## 2024-04-24 DIAGNOSIS — I10 Essential (primary) hypertension: Secondary | ICD-10-CM | POA: Diagnosis not present

## 2024-04-24 DIAGNOSIS — W06XXXA Fall from bed, initial encounter: Secondary | ICD-10-CM | POA: Insufficient documentation

## 2024-04-24 DIAGNOSIS — K5900 Constipation, unspecified: Secondary | ICD-10-CM

## 2024-04-24 LAB — COMPREHENSIVE METABOLIC PANEL WITH GFR
ALT: 41 U/L (ref 0–44)
AST: 128 U/L — ABNORMAL HIGH (ref 15–41)
Albumin: 3.9 g/dL (ref 3.5–5.0)
Alkaline Phosphatase: 61 U/L (ref 38–126)
Anion gap: 15 (ref 5–15)
BUN: 22 mg/dL (ref 8–23)
CO2: 24 mmol/L (ref 22–32)
Calcium: 9.5 mg/dL (ref 8.9–10.3)
Chloride: 108 mmol/L (ref 98–111)
Creatinine, Ser: 0.81 mg/dL (ref 0.44–1.00)
GFR, Estimated: >60 mL/min (ref >60)
Glucose, Bld: 114 mg/dL — ABNORMAL HIGH (ref 70–99)
Potassium: 3.9 mmol/L (ref 3.5–5.1)
Sodium: 147 mmol/L — ABNORMAL HIGH (ref 135–145)
Total Bilirubin: 0.3 mg/dL (ref 0.0–1.2)
Total Protein: 7.7 g/dL (ref 6.5–8.1)

## 2024-04-24 LAB — CBC WITH DIFFERENTIAL/PLATELET
Abs Immature Granulocytes: 0.03 K/uL (ref 0.00–0.07)
Basophils Absolute: 0 K/uL (ref 0.0–0.1)
Basophils Relative: 0 %
Eosinophils Absolute: 0 K/uL (ref 0.0–0.5)
Eosinophils Relative: 0 %
HCT: 43.1 % (ref 36.0–46.0)
Hemoglobin: 13 g/dL (ref 12.0–15.0)
Immature Granulocytes: 1 %
Lymphocytes Relative: 23 %
Lymphs Abs: 1.3 K/uL (ref 0.7–4.0)
MCH: 29.5 pg (ref 26.0–34.0)
MCHC: 30.2 g/dL (ref 30.0–36.0)
MCV: 98 fL (ref 80.0–100.0)
Monocytes Absolute: 0.6 K/uL (ref 0.1–1.0)
Monocytes Relative: 10 %
Neutro Abs: 3.8 K/uL (ref 1.7–7.7)
Neutrophils Relative %: 66 %
Platelets: 145 K/uL — ABNORMAL LOW (ref 150–400)
RBC: 4.4 MIL/uL (ref 3.87–5.11)
RDW: 13.2 % (ref 11.5–15.5)
WBC: 5.7 K/uL (ref 4.0–10.5)
nRBC: 0 % (ref 0.0–0.2)

## 2024-04-24 MED ORDER — ACETAMINOPHEN 500 MG PO TABS: 1000.0000 mg | ORAL_TABLET | Freq: Once | ORAL | Status: DC

## 2024-04-24 MED ORDER — LORAZEPAM 2 MG/ML IJ SOLN
0.5000 mg | Freq: Once | INTRAMUSCULAR | Status: AC
  Administered 2024-04-24: 0.5 mg via INTRAVENOUS
  Filled 2024-04-24: qty 1

## 2024-04-24 NOTE — Discharge Instructions (Addendum)
 CT imaging of the head and neck and pelvis were negative for fractures I did not see any obvious injuries on examination although was slightly limited secondary to patient's dementia.  Her CT scan did note some stool in her rectum I did do a rectal exam she does have some stool down there but it was soft I was not able to remove any.  Make sure she is taking laxatives such as MiraLAX  at the facility and you can try using a suppository to help with bowel movements if she is not having good bowel movements.  You can follow with the primary care doctor return to the ER for worsening symptoms or any other concern  IMPRESSION:  1. Large amount of stool in the rectum, concerning for impaction.  2. Sigmoid diverticulosis without inflammation.

## 2024-04-24 NOTE — ED Triage Notes (Signed)
 Pt in from [facility]. Pt had a fall out of bed this morning. Per facility pt c/o pain on the left side. Per EMS pt only says yes or no at baseline. Has dementia at baseline.

## 2024-04-24 NOTE — ED Notes (Signed)
 [facility] here for pt transport. Unable to get a hold of facility. Called x2 [phone]

## 2024-04-24 NOTE — ED Provider Notes (Signed)
 [facility] Provider Note    Event Date/Time   First MD Initiated Contact with Patient 04/24/24 1011     (approximate)   History   No chief complaint on file.   HPI  Erica Mueller is a 73 y.o. female with diabetes, dementia, hypertension who comes in with concerns for a fall.  I reviewed the note from 03/27/2024 Palliative was consulted on initial presentation for GOC discussion and guardianship NOK was researched but not a clear guardian found. She remains full code and full scope of care at this time. I recommend palliative following outpatient.   Patient at baseline is only able to say yes or no there was report that she fell out of the bed this morning and is complaining of pain on the left side.  Called the facility and they report that at baseline she uses a wheelchair.  She is able to ambulate on her own but due to frequent falls they try to keep her in a wheelchair but sometimes she does forget.  They reported that they did hear a thump which then called them to the room so she was not laying down on the ground a long time.  Unclear if she could have hit her head.  They were concerned that she was complaining of pain somewhere in her hips but is hard to tell where it was located that.  Physical Exam   Triage Vital Signs: ED Triage Vitals 04/24/24 1011  Encounter Vitals Group     BP      Girls Systolic BP Percentile      Girls Diastolic BP Percentile      Boys Systolic BP Percentile      Boys Diastolic BP Percentile      Pulse      Resp 18     Temp 98.4 F (36.9 C)     Temp Source Oral     SpO2      Weight      Height      Head Circumference      Peak Flow      Pain Score      Pain Loc      Pain Education      Exclude from Growth Chart     Most recent vital signs: Vitals:   04/24/24 1011  Resp: 18  Temp: 98.4 F (36.9 C)     General: Awake, no distress.  CV:  Good peripheral perfusion.  Resp:  Normal effort.  Clear  lungs Abd:  No distention.  Soft and nontender Other:  Patient appears to be moving extremities and when I tried to lift them off the range, I do not see any obvious discomfort Patient is moving around in bed unable to follow commands secondary to known dementia  ED Results / Procedures / Treatments   Labs (all labs ordered are listed, but only abnormal results are displayed) Labs Reviewed  CBC WITH DIFFERENTIAL/PLATELET - Abnormal; Notable for the following components:      Result Value   Platelets 145 (*)    All other components within normal limits  COMPREHENSIVE METABOLIC PANEL WITH GFR - Abnormal; Notable for the following components:   Sodium 147 (*)    Glucose, Bld 114 (*)    AST 128 (*)    All other components within normal limits     EKG  My interpretation of EKG:  Normal sinus rate of 70 without any ST elevation or T wave inversions,  normal intervals  RADIOLOGY I have reviewed the xray personally and interpreted no evidence of any fracture   PROCEDURES:  Critical Care performed: No  Procedures   MEDICATIONS ORDERED IN ED: Medications - No data to display   IMPRESSION / MDM / ASSESSMENT AND PLAN / ED COURSE  I reviewed the triage vital signs and the nursing notes.   Patient's presentation is most consistent with acute presentation with potential threat to life or bodily function.   Differential includes intracranial hemorrhage, cervical fracture, fracture of her leg.  On examination she does not really seem to be in any pain.  Moves left of both legs without any issues.  However given she is such a poor story will get xray imaging of chest and pelvis with x-rays and get CTs to rule out intracranial hemorrhage, cervical fracture, pelvic fracture.  I did ord she does have a UA checked on October 13 that was negative so UTI symptoms less likely as she has no fever er some IV Ativan  just to help facilitate getting images as she is on as needed Ativan  at her  facility.  And she has had decline in her function for some time now thought to be related to her dementia.  X-ray of the knee was negative Ankle x-ray was negative IMPRESSION:  1. Large amount of stool in the rectum, concerning for impaction.  2. Sigmoid diverticulosis without inflammation.  CT head and neck were negative  CMP shows elevated sodium of 147 and CBC was reassuring   I did do a bedside rectal and there was soft stool in the vault but nothing that I could removed.  Will recommend a stool regimen at facility   Called sister to update- and she is aware and comfortable with plan.    FINAL CLINICAL IMPRESSION(S) / ED DIAGNOSES   Final diagnoses:  Fall, initial encounter  Constipation, unspecified constipation type     Rx / DC Orders   ED Discharge Orders     None        Note:  This document was prepared using Dragon voice recognition software and may include unintentional dictation errors.   Ernest Ronal BRAVO, MD 04/24/24 1314

## 2024-04-24 NOTE — ED Notes (Signed)
 Unable to get EKG. Monitor in rm not working. Pt continues to pull off all cords and monitoring equipment.

## 2024-04-24 NOTE — ED Notes (Signed)
 Unable to get blood work from [employer]. Pt taken to CT & x-rays. Blood work delayed.

## 2024-06-11 DEATH — deceased
